# Patient Record
Sex: Male | Born: 1940
Health system: Southern US, Community
[De-identification: ages and names within clinical notes are randomized; demographics above are authoritative.]

## PROBLEM LIST (undated history)

## (undated) DIAGNOSIS — F419 Anxiety disorder, unspecified: Secondary | ICD-10-CM

## (undated) DIAGNOSIS — K449 Diaphragmatic hernia without obstruction or gangrene: Secondary | ICD-10-CM

## (undated) DIAGNOSIS — F329 Major depressive disorder, single episode, unspecified: Secondary | ICD-10-CM

## (undated) DIAGNOSIS — K5732 Diverticulitis of large intestine without perforation or abscess without bleeding: Secondary | ICD-10-CM

## (undated) DIAGNOSIS — T7840XA Allergy, unspecified, initial encounter: Secondary | ICD-10-CM

## (undated) DIAGNOSIS — M5136 Other intervertebral disc degeneration, lumbar region: Secondary | ICD-10-CM

## (undated) DIAGNOSIS — Z9889 Other specified postprocedural states: Secondary | ICD-10-CM

## (undated) DIAGNOSIS — J189 Pneumonia, unspecified organism: Secondary | ICD-10-CM

## (undated) DIAGNOSIS — M4800 Spinal stenosis, site unspecified: Secondary | ICD-10-CM

## (undated) DIAGNOSIS — N400 Enlarged prostate without lower urinary tract symptoms: Secondary | ICD-10-CM

## (undated) DIAGNOSIS — Z860100 Personal history of colon polyps, unspecified: Secondary | ICD-10-CM

## (undated) DIAGNOSIS — I739 Peripheral vascular disease, unspecified: Secondary | ICD-10-CM

## (undated) DIAGNOSIS — M51369 Other intervertebral disc degeneration, lumbar region without mention of lumbar back pain or lower extremity pain: Secondary | ICD-10-CM

## (undated) DIAGNOSIS — M545 Low back pain, unspecified: Secondary | ICD-10-CM

## (undated) DIAGNOSIS — G4733 Obstructive sleep apnea (adult) (pediatric): Secondary | ICD-10-CM

## (undated) DIAGNOSIS — I2510 Atherosclerotic heart disease of native coronary artery without angina pectoris: Secondary | ICD-10-CM

## (undated) DIAGNOSIS — IMO0001 Reserved for inherently not codable concepts without codable children: Secondary | ICD-10-CM

## (undated) DIAGNOSIS — N189 Chronic kidney disease, unspecified: Secondary | ICD-10-CM

## (undated) DIAGNOSIS — E785 Hyperlipidemia, unspecified: Secondary | ICD-10-CM

## (undated) DIAGNOSIS — D509 Iron deficiency anemia, unspecified: Secondary | ICD-10-CM

## (undated) DIAGNOSIS — K222 Esophageal obstruction: Secondary | ICD-10-CM

## (undated) DIAGNOSIS — G9340 Encephalopathy, unspecified: Secondary | ICD-10-CM

## (undated) DIAGNOSIS — G4700 Insomnia, unspecified: Secondary | ICD-10-CM

## (undated) DIAGNOSIS — J439 Emphysema, unspecified: Secondary | ICD-10-CM

## (undated) DIAGNOSIS — I10 Essential (primary) hypertension: Secondary | ICD-10-CM

## (undated) DIAGNOSIS — K219 Gastro-esophageal reflux disease without esophagitis: Secondary | ICD-10-CM

## (undated) DIAGNOSIS — M1990 Unspecified osteoarthritis, unspecified site: Secondary | ICD-10-CM

## (undated) DIAGNOSIS — F0390 Unspecified dementia without behavioral disturbance: Secondary | ICD-10-CM

## (undated) DIAGNOSIS — F32A Depression, unspecified: Secondary | ICD-10-CM

## (undated) DIAGNOSIS — H9190 Unspecified hearing loss, unspecified ear: Secondary | ICD-10-CM

## (undated) DIAGNOSIS — J449 Chronic obstructive pulmonary disease, unspecified: Secondary | ICD-10-CM

## (undated) DIAGNOSIS — R413 Other amnesia: Secondary | ICD-10-CM

## (undated) DIAGNOSIS — IMO0002 Reserved for concepts with insufficient information to code with codable children: Secondary | ICD-10-CM

## (undated) HISTORY — DX: Unspecified osteoarthritis, unspecified site: M19.90

## (undated) HISTORY — DX: Other intervertebral disc degeneration, lumbar region: M51.36

## (undated) HISTORY — DX: Anxiety disorder, unspecified: F41.9

## (undated) HISTORY — DX: Major depressive disorder, single episode, unspecified: F32.9

## (undated) HISTORY — DX: Chronic obstructive pulmonary disease, unspecified: J44.9

## (undated) HISTORY — DX: Diaphragmatic hernia without obstruction or gangrene: K44.9

## (undated) HISTORY — DX: Benign prostatic hyperplasia without lower urinary tract symptoms: N40.0

## (undated) HISTORY — DX: Low back pain: M54.5

## (undated) HISTORY — PX: SHOULDER ARTHROSCOPY: SHX128

## (undated) HISTORY — DX: Obstructive sleep apnea (adult) (pediatric): G47.33

## (undated) HISTORY — DX: Essential (primary) hypertension: I10

## (undated) HISTORY — DX: Low back pain, unspecified: M54.50

## (undated) HISTORY — DX: Other specified postprocedural states: Z98.890

## (undated) HISTORY — DX: Iron deficiency anemia, unspecified: D50.9

## (undated) HISTORY — DX: Personal history of colon polyps, unspecified: Z86.0100

## (undated) HISTORY — DX: Reserved for concepts with insufficient information to code with codable children: IMO0002

## (undated) HISTORY — DX: Gastro-esophageal reflux disease without esophagitis: K21.9

## (undated) HISTORY — PX: CIRCUMCISION: SUR203

## (undated) HISTORY — DX: Atherosclerotic heart disease of native coronary artery without angina pectoris: I25.10

## (undated) HISTORY — DX: Encephalopathy, unspecified: G93.40

## (undated) HISTORY — DX: Peripheral vascular disease, unspecified: I73.9

## (undated) HISTORY — PX: CARPAL TUNNEL RELEASE: SHX101

## (undated) HISTORY — DX: Diverticulitis of large intestine without perforation or abscess without bleeding: K57.32

## (undated) HISTORY — DX: Spinal stenosis, site unspecified: M48.00

## (undated) HISTORY — DX: Depression, unspecified: F32.A

## (undated) HISTORY — DX: Personal history of colonic polyps: Z86.010

## (undated) HISTORY — DX: Esophageal obstruction: K22.2

## (undated) HISTORY — DX: Other amnesia: R41.3

## (undated) HISTORY — DX: Emphysema, unspecified: J43.9

## (undated) HISTORY — DX: Other intervertebral disc degeneration, lumbar region without mention of lumbar back pain or lower extremity pain: M51.369

## (undated) HISTORY — DX: Insomnia, unspecified: G47.00

## (undated) HISTORY — PX: OTHER SURGICAL HISTORY: SHX169

## (undated) HISTORY — PX: HEMORRHOID SURGERY: SHX153

## (undated) HISTORY — DX: Allergy, unspecified, initial encounter: T78.40XA

## (undated) HISTORY — DX: Hyperlipidemia, unspecified: E78.5

---

## 1997-04-29 ENCOUNTER — Encounter: Payer: Self-pay | Admitting: Internal Medicine

## 1997-05-09 HISTORY — PX: CORONARY ANGIOPLASTY WITH STENT PLACEMENT: SHX49

## 1997-09-06 HISTORY — PX: CORONARY ARTERY BYPASS GRAFT: SHX141

## 1997-09-07 ENCOUNTER — Inpatient Hospital Stay (HOSPITAL_COMMUNITY): Admission: EM | Admit: 1997-09-07 | Discharge: 1997-09-16 | Payer: Self-pay | Admitting: Emergency Medicine

## 1997-11-22 ENCOUNTER — Encounter (HOSPITAL_COMMUNITY): Admission: RE | Admit: 1997-11-22 | Discharge: 1998-02-20 | Payer: Self-pay | Admitting: Cardiovascular Disease

## 1998-03-15 ENCOUNTER — Encounter: Payer: Self-pay | Admitting: Internal Medicine

## 1998-10-31 ENCOUNTER — Ambulatory Visit (HOSPITAL_BASED_OUTPATIENT_CLINIC_OR_DEPARTMENT_OTHER): Admission: RE | Admit: 1998-10-31 | Discharge: 1998-10-31 | Payer: Self-pay | Admitting: Orthopedic Surgery

## 1999-02-21 ENCOUNTER — Ambulatory Visit (HOSPITAL_COMMUNITY): Admission: RE | Admit: 1999-02-21 | Discharge: 1999-02-21 | Payer: Self-pay | Admitting: Orthopedic Surgery

## 1999-05-04 ENCOUNTER — Encounter: Payer: Self-pay | Admitting: Internal Medicine

## 1999-05-15 ENCOUNTER — Encounter: Admission: RE | Admit: 1999-05-15 | Discharge: 1999-05-15 | Payer: Self-pay | Admitting: Orthopedic Surgery

## 1999-05-16 ENCOUNTER — Ambulatory Visit (HOSPITAL_BASED_OUTPATIENT_CLINIC_OR_DEPARTMENT_OTHER): Admission: RE | Admit: 1999-05-16 | Discharge: 1999-05-17 | Payer: Self-pay | Admitting: Orthopedic Surgery

## 1999-12-25 ENCOUNTER — Ambulatory Visit (HOSPITAL_COMMUNITY): Admission: RE | Admit: 1999-12-25 | Discharge: 1999-12-25 | Payer: Self-pay | Admitting: Orthopedic Surgery

## 2000-01-31 ENCOUNTER — Encounter: Payer: Self-pay | Admitting: Orthopedic Surgery

## 2000-02-04 ENCOUNTER — Inpatient Hospital Stay (HOSPITAL_COMMUNITY): Admission: RE | Admit: 2000-02-04 | Discharge: 2000-02-06 | Payer: Self-pay | Admitting: Orthopedic Surgery

## 2000-02-04 ENCOUNTER — Encounter: Payer: Self-pay | Admitting: Orthopedic Surgery

## 2000-02-05 ENCOUNTER — Encounter: Payer: Self-pay | Admitting: Orthopedic Surgery

## 2000-07-16 ENCOUNTER — Encounter: Payer: Self-pay | Admitting: Internal Medicine

## 2000-08-29 ENCOUNTER — Encounter (INDEPENDENT_AMBULATORY_CARE_PROVIDER_SITE_OTHER): Payer: Self-pay | Admitting: Specialist

## 2000-08-29 ENCOUNTER — Encounter: Payer: Self-pay | Admitting: Internal Medicine

## 2000-08-29 ENCOUNTER — Ambulatory Visit (HOSPITAL_COMMUNITY): Admission: RE | Admit: 2000-08-29 | Discharge: 2000-08-29 | Payer: Self-pay | Admitting: Internal Medicine

## 2000-10-22 ENCOUNTER — Encounter: Payer: Self-pay | Admitting: Internal Medicine

## 2000-10-22 ENCOUNTER — Ambulatory Visit (HOSPITAL_COMMUNITY): Admission: RE | Admit: 2000-10-22 | Discharge: 2000-10-22 | Payer: Self-pay | Admitting: Internal Medicine

## 2000-10-24 ENCOUNTER — Ambulatory Visit (HOSPITAL_BASED_OUTPATIENT_CLINIC_OR_DEPARTMENT_OTHER): Admission: RE | Admit: 2000-10-24 | Discharge: 2000-10-24 | Payer: Self-pay | Admitting: Internal Medicine

## 2000-11-27 ENCOUNTER — Encounter: Admission: RE | Admit: 2000-11-27 | Discharge: 2000-12-06 | Payer: Self-pay | Admitting: Anesthesiology

## 2001-10-12 ENCOUNTER — Ambulatory Visit (HOSPITAL_COMMUNITY): Admission: RE | Admit: 2001-10-12 | Discharge: 2001-10-13 | Payer: Self-pay | Admitting: Cardiovascular Disease

## 2001-10-12 ENCOUNTER — Encounter: Payer: Self-pay | Admitting: Cardiovascular Disease

## 2002-01-25 ENCOUNTER — Encounter: Payer: Self-pay | Admitting: Internal Medicine

## 2002-04-16 ENCOUNTER — Emergency Department (HOSPITAL_COMMUNITY): Admission: EM | Admit: 2002-04-16 | Discharge: 2002-04-16 | Payer: Self-pay | Admitting: Emergency Medicine

## 2002-04-16 ENCOUNTER — Encounter: Payer: Self-pay | Admitting: Emergency Medicine

## 2002-05-20 ENCOUNTER — Encounter: Admission: RE | Admit: 2002-05-20 | Discharge: 2002-08-18 | Payer: Self-pay

## 2002-10-12 ENCOUNTER — Encounter: Payer: Self-pay | Admitting: Internal Medicine

## 2002-10-20 ENCOUNTER — Encounter: Payer: Self-pay | Admitting: Internal Medicine

## 2002-10-20 ENCOUNTER — Ambulatory Visit (HOSPITAL_COMMUNITY): Admission: RE | Admit: 2002-10-20 | Discharge: 2002-10-20 | Payer: Self-pay | Admitting: Internal Medicine

## 2003-01-18 ENCOUNTER — Ambulatory Visit (HOSPITAL_COMMUNITY): Admission: RE | Admit: 2003-01-18 | Discharge: 2003-01-18 | Payer: Self-pay | Admitting: Internal Medicine

## 2003-01-18 ENCOUNTER — Encounter: Payer: Self-pay | Admitting: Internal Medicine

## 2003-03-05 ENCOUNTER — Emergency Department (HOSPITAL_COMMUNITY): Admission: EM | Admit: 2003-03-05 | Discharge: 2003-03-06 | Payer: Self-pay | Admitting: Emergency Medicine

## 2003-06-19 ENCOUNTER — Emergency Department (HOSPITAL_COMMUNITY): Admission: EM | Admit: 2003-06-19 | Discharge: 2003-06-19 | Payer: Self-pay

## 2003-11-24 ENCOUNTER — Ambulatory Visit (HOSPITAL_COMMUNITY): Admission: RE | Admit: 2003-11-24 | Discharge: 2003-11-24 | Payer: Self-pay | Admitting: Cardiovascular Disease

## 2003-12-26 ENCOUNTER — Encounter
Admission: RE | Admit: 2003-12-26 | Discharge: 2004-03-25 | Payer: Self-pay | Admitting: Physical Medicine & Rehabilitation

## 2003-12-27 ENCOUNTER — Ambulatory Visit: Payer: Self-pay | Admitting: Physical Medicine & Rehabilitation

## 2004-01-02 ENCOUNTER — Inpatient Hospital Stay (HOSPITAL_COMMUNITY): Admission: EM | Admit: 2004-01-02 | Discharge: 2004-01-04 | Payer: Self-pay | Admitting: Psychiatry

## 2004-01-02 ENCOUNTER — Ambulatory Visit: Payer: Self-pay | Admitting: Psychiatry

## 2004-01-02 ENCOUNTER — Encounter: Payer: Self-pay | Admitting: Physical Medicine & Rehabilitation

## 2004-01-02 ENCOUNTER — Emergency Department (HOSPITAL_COMMUNITY): Admission: EM | Admit: 2004-01-02 | Discharge: 2004-01-02 | Payer: Self-pay | Admitting: Emergency Medicine

## 2004-01-03 ENCOUNTER — Emergency Department (HOSPITAL_COMMUNITY): Admission: EM | Admit: 2004-01-03 | Discharge: 2004-01-03 | Payer: Self-pay | Admitting: Emergency Medicine

## 2004-01-04 ENCOUNTER — Inpatient Hospital Stay (HOSPITAL_COMMUNITY): Admission: EM | Admit: 2004-01-04 | Discharge: 2004-01-07 | Payer: Self-pay | Admitting: Emergency Medicine

## 2004-02-06 ENCOUNTER — Ambulatory Visit: Payer: Self-pay | Admitting: Physical Medicine & Rehabilitation

## 2004-02-15 ENCOUNTER — Ambulatory Visit (HOSPITAL_BASED_OUTPATIENT_CLINIC_OR_DEPARTMENT_OTHER): Admission: RE | Admit: 2004-02-15 | Discharge: 2004-02-15 | Payer: Self-pay | Admitting: Orthopedic Surgery

## 2004-02-15 ENCOUNTER — Encounter: Admission: RE | Admit: 2004-02-15 | Discharge: 2004-02-15 | Payer: Self-pay | Admitting: Orthopedic Surgery

## 2004-04-12 ENCOUNTER — Ambulatory Visit: Payer: Self-pay | Admitting: Internal Medicine

## 2004-06-06 ENCOUNTER — Ambulatory Visit: Payer: Self-pay | Admitting: Internal Medicine

## 2004-06-15 ENCOUNTER — Ambulatory Visit (HOSPITAL_COMMUNITY): Admission: RE | Admit: 2004-06-15 | Discharge: 2004-06-15 | Payer: Self-pay | Admitting: Internal Medicine

## 2004-06-15 ENCOUNTER — Ambulatory Visit: Payer: Self-pay | Admitting: Internal Medicine

## 2005-01-30 ENCOUNTER — Ambulatory Visit: Payer: Self-pay | Admitting: Internal Medicine

## 2005-01-31 ENCOUNTER — Ambulatory Visit: Payer: Self-pay | Admitting: Internal Medicine

## 2005-02-12 ENCOUNTER — Ambulatory Visit: Payer: Self-pay | Admitting: Internal Medicine

## 2005-05-10 ENCOUNTER — Ambulatory Visit: Payer: Self-pay | Admitting: Internal Medicine

## 2005-07-24 ENCOUNTER — Ambulatory Visit: Payer: Self-pay | Admitting: Internal Medicine

## 2005-08-02 ENCOUNTER — Ambulatory Visit: Payer: Self-pay | Admitting: Internal Medicine

## 2005-10-01 ENCOUNTER — Ambulatory Visit: Payer: Self-pay | Admitting: Internal Medicine

## 2005-10-02 ENCOUNTER — Ambulatory Visit: Payer: Self-pay | Admitting: Internal Medicine

## 2005-10-28 ENCOUNTER — Ambulatory Visit: Payer: Self-pay | Admitting: Internal Medicine

## 2005-11-06 ENCOUNTER — Ambulatory Visit (HOSPITAL_COMMUNITY): Admission: RE | Admit: 2005-11-06 | Discharge: 2005-11-06 | Payer: Self-pay | Admitting: Internal Medicine

## 2006-01-23 ENCOUNTER — Ambulatory Visit: Payer: Self-pay | Admitting: Internal Medicine

## 2006-05-02 ENCOUNTER — Ambulatory Visit: Payer: Self-pay | Admitting: Internal Medicine

## 2006-05-28 ENCOUNTER — Ambulatory Visit: Payer: Self-pay | Admitting: Endocrinology

## 2006-06-24 ENCOUNTER — Ambulatory Visit: Payer: Self-pay | Admitting: Internal Medicine

## 2006-07-22 ENCOUNTER — Ambulatory Visit: Payer: Self-pay | Admitting: Internal Medicine

## 2006-07-25 ENCOUNTER — Ambulatory Visit: Payer: Self-pay | Admitting: Cardiology

## 2006-12-18 ENCOUNTER — Ambulatory Visit: Payer: Self-pay | Admitting: Internal Medicine

## 2006-12-18 LAB — CONVERTED CEMR LAB
AST: 19 units/L (ref 0–37)
Albumin: 3.5 g/dL (ref 3.5–5.2)
Basophils Relative: 0.3 % (ref 0.0–1.0)
Bilirubin, Direct: 0.1 mg/dL (ref 0.0–0.3)
CO2: 29 meq/L (ref 19–32)
Chloride: 100 meq/L (ref 96–112)
Creatinine, Ser: 0.7 mg/dL (ref 0.4–1.5)
Eosinophils Relative: 2.9 % (ref 0.0–5.0)
Glucose, Bld: 107 mg/dL — ABNORMAL HIGH (ref 70–99)
HCT: 37.8 % — ABNORMAL LOW (ref 39.0–52.0)
Hemoglobin: 12.7 g/dL — ABNORMAL LOW (ref 13.0–17.0)
Hgb A1c MFr Bld: 7 % — ABNORMAL HIGH (ref 4.6–6.0)
Monocytes Absolute: 0.4 10*3/uL (ref 0.2–0.7)
Neutrophils Relative %: 60.3 % (ref 43.0–77.0)
RBC: 4.13 M/uL — ABNORMAL LOW (ref 4.22–5.81)
RDW: 12.8 % (ref 11.5–14.6)
Sodium: 137 meq/L (ref 135–145)
Total Bilirubin: 0.5 mg/dL (ref 0.3–1.2)
Total CHOL/HDL Ratio: 9
Total Protein: 7.1 g/dL (ref 6.0–8.3)
Triglycerides: 511 mg/dL (ref 0–149)
WBC: 6.2 10*3/uL (ref 4.5–10.5)

## 2006-12-26 ENCOUNTER — Encounter: Admission: RE | Admit: 2006-12-26 | Discharge: 2006-12-26 | Payer: Self-pay | Admitting: Internal Medicine

## 2007-01-05 ENCOUNTER — Ambulatory Visit: Payer: Self-pay | Admitting: Internal Medicine

## 2007-01-13 ENCOUNTER — Ambulatory Visit: Payer: Self-pay | Admitting: Cardiology

## 2007-01-21 ENCOUNTER — Ambulatory Visit: Payer: Self-pay | Admitting: Internal Medicine

## 2007-01-22 ENCOUNTER — Telehealth (INDEPENDENT_AMBULATORY_CARE_PROVIDER_SITE_OTHER): Payer: Self-pay | Admitting: *Deleted

## 2007-02-09 ENCOUNTER — Ambulatory Visit (HOSPITAL_COMMUNITY): Admission: RE | Admit: 2007-02-09 | Discharge: 2007-02-09 | Payer: Self-pay | Admitting: Internal Medicine

## 2007-02-09 ENCOUNTER — Encounter: Payer: Self-pay | Admitting: Internal Medicine

## 2007-02-09 LAB — HM COLONOSCOPY

## 2007-02-17 ENCOUNTER — Encounter: Payer: Self-pay | Admitting: Internal Medicine

## 2007-02-20 ENCOUNTER — Ambulatory Visit: Payer: Self-pay | Admitting: Internal Medicine

## 2007-04-10 ENCOUNTER — Encounter: Payer: Self-pay | Admitting: Internal Medicine

## 2007-04-10 ENCOUNTER — Encounter: Admission: RE | Admit: 2007-04-10 | Discharge: 2007-04-10 | Payer: Self-pay | Admitting: Orthopedic Surgery

## 2007-04-27 ENCOUNTER — Encounter: Admission: RE | Admit: 2007-04-27 | Discharge: 2007-04-27 | Payer: Self-pay | Admitting: Orthopedic Surgery

## 2007-04-29 ENCOUNTER — Telehealth: Payer: Self-pay | Admitting: Internal Medicine

## 2007-06-12 DIAGNOSIS — E785 Hyperlipidemia, unspecified: Secondary | ICD-10-CM | POA: Insufficient documentation

## 2007-06-12 DIAGNOSIS — I10 Essential (primary) hypertension: Secondary | ICD-10-CM | POA: Insufficient documentation

## 2007-06-12 DIAGNOSIS — D126 Benign neoplasm of colon, unspecified: Secondary | ICD-10-CM | POA: Insufficient documentation

## 2007-06-12 DIAGNOSIS — M5136 Other intervertebral disc degeneration, lumbar region: Secondary | ICD-10-CM | POA: Insufficient documentation

## 2007-06-12 DIAGNOSIS — I2510 Atherosclerotic heart disease of native coronary artery without angina pectoris: Secondary | ICD-10-CM | POA: Insufficient documentation

## 2007-06-12 DIAGNOSIS — I739 Peripheral vascular disease, unspecified: Secondary | ICD-10-CM | POA: Insufficient documentation

## 2007-06-12 DIAGNOSIS — I209 Angina pectoris, unspecified: Secondary | ICD-10-CM | POA: Insufficient documentation

## 2007-06-12 DIAGNOSIS — M545 Low back pain, unspecified: Secondary | ICD-10-CM | POA: Insufficient documentation

## 2007-06-12 DIAGNOSIS — M4800 Spinal stenosis, site unspecified: Secondary | ICD-10-CM | POA: Insufficient documentation

## 2007-06-12 DIAGNOSIS — K219 Gastro-esophageal reflux disease without esophagitis: Secondary | ICD-10-CM | POA: Insufficient documentation

## 2007-06-12 DIAGNOSIS — K222 Esophageal obstruction: Secondary | ICD-10-CM | POA: Insufficient documentation

## 2007-06-12 DIAGNOSIS — E119 Type 2 diabetes mellitus without complications: Secondary | ICD-10-CM | POA: Insufficient documentation

## 2007-06-12 DIAGNOSIS — K5730 Diverticulosis of large intestine without perforation or abscess without bleeding: Secondary | ICD-10-CM | POA: Insufficient documentation

## 2007-06-24 ENCOUNTER — Ambulatory Visit: Payer: Self-pay | Admitting: Internal Medicine

## 2007-06-24 DIAGNOSIS — G4710 Hypersomnia, unspecified: Secondary | ICD-10-CM | POA: Insufficient documentation

## 2007-06-24 DIAGNOSIS — F3289 Other specified depressive episodes: Secondary | ICD-10-CM | POA: Insufficient documentation

## 2007-06-24 DIAGNOSIS — F411 Generalized anxiety disorder: Secondary | ICD-10-CM | POA: Insufficient documentation

## 2007-06-24 DIAGNOSIS — R5383 Other fatigue: Secondary | ICD-10-CM

## 2007-06-24 DIAGNOSIS — F329 Major depressive disorder, single episode, unspecified: Secondary | ICD-10-CM | POA: Insufficient documentation

## 2007-06-24 DIAGNOSIS — R3911 Hesitancy of micturition: Secondary | ICD-10-CM | POA: Insufficient documentation

## 2007-06-24 DIAGNOSIS — R109 Unspecified abdominal pain: Secondary | ICD-10-CM | POA: Insufficient documentation

## 2007-06-24 DIAGNOSIS — R5381 Other malaise: Secondary | ICD-10-CM | POA: Insufficient documentation

## 2007-06-24 DIAGNOSIS — N400 Enlarged prostate without lower urinary tract symptoms: Secondary | ICD-10-CM | POA: Insufficient documentation

## 2007-06-25 LAB — CONVERTED CEMR LAB
ALT: 15 units/L (ref 0–53)
AST: 20 units/L (ref 0–37)
Albumin: 3.4 g/dL — ABNORMAL LOW (ref 3.5–5.2)
Alkaline Phosphatase: 90 units/L (ref 39–117)
BUN: 14 mg/dL (ref 6–23)
Basophils Absolute: 0.1 10*3/uL (ref 0.0–0.1)
Bilirubin Urine: NEGATIVE
Chloride: 103 meq/L (ref 96–112)
Cholesterol: 174 mg/dL (ref 0–200)
Creatinine, Ser: 0.8 mg/dL (ref 0.4–1.5)
Direct LDL: 107.6 mg/dL
HCT: 35.9 % — ABNORMAL LOW (ref 39.0–52.0)
Ketones, ur: NEGATIVE mg/dL
MCHC: 32.2 g/dL (ref 30.0–36.0)
Monocytes Relative: 10.3 % (ref 3.0–11.0)
RBC: 4.05 M/uL — ABNORMAL LOW (ref 4.22–5.81)
RDW: 15.5 % — ABNORMAL HIGH (ref 11.5–14.6)
TSH: 2.87 microintl units/mL (ref 0.35–5.50)
Total Bilirubin: 0.5 mg/dL (ref 0.3–1.2)
Total CHOL/HDL Ratio: 5.8
Total Protein, Urine: NEGATIVE mg/dL
Urobilinogen, UA: 0.2 (ref 0.0–1.0)
VLDL: 44 mg/dL — ABNORMAL HIGH (ref 0–40)
pH: 6 (ref 5.0–8.0)

## 2007-07-01 ENCOUNTER — Encounter: Admission: RE | Admit: 2007-07-01 | Discharge: 2007-07-01 | Payer: Self-pay | Admitting: Internal Medicine

## 2007-07-03 ENCOUNTER — Encounter: Payer: Self-pay | Admitting: Internal Medicine

## 2007-07-03 ENCOUNTER — Ambulatory Visit: Payer: Self-pay | Admitting: Internal Medicine

## 2007-07-03 ENCOUNTER — Ambulatory Visit: Payer: Self-pay | Admitting: Pulmonary Disease

## 2007-07-03 DIAGNOSIS — G4733 Obstructive sleep apnea (adult) (pediatric): Secondary | ICD-10-CM | POA: Insufficient documentation

## 2007-07-22 ENCOUNTER — Ambulatory Visit: Payer: Self-pay | Admitting: Cardiology

## 2007-07-29 ENCOUNTER — Ambulatory Visit (HOSPITAL_BASED_OUTPATIENT_CLINIC_OR_DEPARTMENT_OTHER): Admission: RE | Admit: 2007-07-29 | Discharge: 2007-07-29 | Payer: Self-pay | Admitting: Pulmonary Disease

## 2007-07-29 ENCOUNTER — Encounter: Payer: Self-pay | Admitting: Pulmonary Disease

## 2007-08-12 ENCOUNTER — Telehealth (INDEPENDENT_AMBULATORY_CARE_PROVIDER_SITE_OTHER): Payer: Self-pay | Admitting: *Deleted

## 2007-08-13 ENCOUNTER — Ambulatory Visit: Payer: Self-pay | Admitting: Pulmonary Disease

## 2007-08-14 ENCOUNTER — Ambulatory Visit: Admission: RE | Admit: 2007-08-14 | Discharge: 2007-08-14 | Payer: Self-pay | Admitting: Pulmonary Disease

## 2007-08-14 ENCOUNTER — Encounter: Payer: Self-pay | Admitting: Pulmonary Disease

## 2007-08-18 ENCOUNTER — Ambulatory Visit: Payer: Self-pay | Admitting: Pulmonary Disease

## 2007-08-27 ENCOUNTER — Telehealth (INDEPENDENT_AMBULATORY_CARE_PROVIDER_SITE_OTHER): Payer: Self-pay | Admitting: *Deleted

## 2007-08-29 ENCOUNTER — Encounter: Payer: Self-pay | Admitting: Pulmonary Disease

## 2007-09-02 ENCOUNTER — Encounter: Payer: Self-pay | Admitting: Pulmonary Disease

## 2007-09-07 ENCOUNTER — Ambulatory Visit: Payer: Self-pay | Admitting: Pulmonary Disease

## 2007-09-07 DIAGNOSIS — J438 Other emphysema: Secondary | ICD-10-CM | POA: Insufficient documentation

## 2007-12-27 ENCOUNTER — Encounter: Payer: Self-pay | Admitting: Pulmonary Disease

## 2007-12-31 ENCOUNTER — Ambulatory Visit: Payer: Self-pay | Admitting: Internal Medicine

## 2007-12-31 DIAGNOSIS — J441 Chronic obstructive pulmonary disease with (acute) exacerbation: Secondary | ICD-10-CM | POA: Insufficient documentation

## 2008-01-03 ENCOUNTER — Encounter: Payer: Self-pay | Admitting: Internal Medicine

## 2008-01-03 DIAGNOSIS — M5137 Other intervertebral disc degeneration, lumbosacral region: Secondary | ICD-10-CM | POA: Insufficient documentation

## 2008-01-04 ENCOUNTER — Telehealth (INDEPENDENT_AMBULATORY_CARE_PROVIDER_SITE_OTHER): Payer: Self-pay | Admitting: *Deleted

## 2008-01-04 LAB — CONVERTED CEMR LAB
Basophils Absolute: 0.1 10*3/uL (ref 0.0–0.1)
Basophils Relative: 0.9 % (ref 0.0–3.0)
Eosinophils Absolute: 0.2 10*3/uL (ref 0.0–0.7)
HDL: 30.4 mg/dL — ABNORMAL LOW (ref >39.0)
MCHC: 33.2 g/dL (ref 30.0–36.0)
MCV: 82.8 fL (ref 78.0–100.0)
Neutrophils Relative %: 55.2 % (ref 43.0–77.0)
PSA: 0.12 ng/mL (ref 0.10–4.00)
Platelets: 390 10*3/uL (ref 150–400)
RBC: 4.4 M/uL (ref 4.22–5.81)
Triglycerides: 420 mg/dL (ref 0–149)

## 2008-01-11 ENCOUNTER — Ambulatory Visit: Payer: Self-pay | Admitting: Cardiology

## 2008-01-14 ENCOUNTER — Ambulatory Visit: Payer: Self-pay | Admitting: Internal Medicine

## 2008-02-17 ENCOUNTER — Ambulatory Visit: Payer: Self-pay | Admitting: Pulmonary Disease

## 2008-03-14 ENCOUNTER — Ambulatory Visit: Payer: Self-pay | Admitting: Internal Medicine

## 2008-04-06 ENCOUNTER — Telehealth: Payer: Self-pay | Admitting: Internal Medicine

## 2008-05-18 ENCOUNTER — Ambulatory Visit: Payer: Self-pay | Admitting: Internal Medicine

## 2008-05-18 DIAGNOSIS — R413 Other amnesia: Secondary | ICD-10-CM | POA: Insufficient documentation

## 2008-05-18 DIAGNOSIS — G4700 Insomnia, unspecified: Secondary | ICD-10-CM | POA: Insufficient documentation

## 2008-05-20 ENCOUNTER — Encounter: Payer: Self-pay | Admitting: Internal Medicine

## 2008-05-20 DIAGNOSIS — D509 Iron deficiency anemia, unspecified: Secondary | ICD-10-CM | POA: Insufficient documentation

## 2008-05-23 LAB — CONVERTED CEMR LAB
AST: 18 units/L (ref 0–37)
Alkaline Phosphatase: 75 units/L (ref 39–117)
Chloride: 107 meq/L (ref 96–112)
Cholesterol: 159 mg/dL (ref 0–200)
Direct LDL: 95.1 mg/dL
Folate: >20 ng/mL
GFR calc Af Amer: 146 mL/min
GFR calc non Af Amer: 120 mL/min
HDL: 29.7 mg/dL — ABNORMAL LOW (ref >39.0)
Hemoglobin: 11 g/dL — ABNORMAL LOW (ref 13.0–17.0)
Hgb A1c MFr Bld: 7.1 % — ABNORMAL HIGH (ref 4.6–6.0)
Lymphocytes Relative: 32.7 % (ref 12.0–46.0)
Monocytes Relative: 10.6 % (ref 3.0–12.0)
Neutrophils Relative %: 53.4 % (ref 43.0–77.0)
Platelets: 282 10*3/uL (ref 150–400)
Potassium: 4.7 meq/L (ref 3.5–5.1)
RDW: 15.2 % — ABNORMAL HIGH (ref 11.5–14.6)
Saturation Ratios: 5.2 % — ABNORMAL LOW (ref 20.0–50.0)
Sodium: 142 meq/L (ref 135–145)
TSH: 3.9 microintl units/mL (ref 0.35–5.50)
Total Bilirubin: 0.5 mg/dL (ref 0.3–1.2)
Total CHOL/HDL Ratio: 5.4
VLDL: 49 mg/dL — ABNORMAL HIGH (ref 0–40)
Vitamin B-12: 400 pg/mL (ref 211–911)

## 2008-05-28 ENCOUNTER — Encounter: Admission: RE | Admit: 2008-05-28 | Discharge: 2008-05-28 | Payer: Self-pay | Admitting: Pediatrics

## 2008-06-06 ENCOUNTER — Telehealth (INDEPENDENT_AMBULATORY_CARE_PROVIDER_SITE_OTHER): Payer: Self-pay | Admitting: *Deleted

## 2008-06-24 ENCOUNTER — Ambulatory Visit: Payer: Self-pay | Admitting: Family Medicine

## 2008-06-24 DIAGNOSIS — M1990 Unspecified osteoarthritis, unspecified site: Secondary | ICD-10-CM | POA: Insufficient documentation

## 2008-06-24 DIAGNOSIS — J45909 Unspecified asthma, uncomplicated: Secondary | ICD-10-CM | POA: Insufficient documentation

## 2008-06-24 DIAGNOSIS — S20219A Contusion of unspecified front wall of thorax, initial encounter: Secondary | ICD-10-CM | POA: Insufficient documentation

## 2008-07-22 ENCOUNTER — Encounter: Payer: Self-pay | Admitting: Internal Medicine

## 2008-07-25 ENCOUNTER — Ambulatory Visit: Payer: Self-pay | Admitting: Internal Medicine

## 2008-07-25 DIAGNOSIS — R1319 Other dysphagia: Secondary | ICD-10-CM | POA: Insufficient documentation

## 2008-07-25 DIAGNOSIS — R1084 Generalized abdominal pain: Secondary | ICD-10-CM | POA: Insufficient documentation

## 2008-07-26 ENCOUNTER — Encounter: Payer: Self-pay | Admitting: Cardiology

## 2008-07-26 ENCOUNTER — Ambulatory Visit: Payer: Self-pay | Admitting: Cardiology

## 2008-07-28 ENCOUNTER — Encounter: Payer: Self-pay | Admitting: Internal Medicine

## 2008-08-05 ENCOUNTER — Ambulatory Visit: Payer: Self-pay | Admitting: Internal Medicine

## 2008-08-05 ENCOUNTER — Encounter: Payer: Self-pay | Admitting: Internal Medicine

## 2008-08-08 ENCOUNTER — Encounter: Payer: Self-pay | Admitting: Internal Medicine

## 2008-08-10 ENCOUNTER — Ambulatory Visit: Payer: Self-pay | Admitting: Cardiovascular Disease

## 2008-10-06 ENCOUNTER — Telehealth (INDEPENDENT_AMBULATORY_CARE_PROVIDER_SITE_OTHER): Payer: Self-pay | Admitting: *Deleted

## 2008-11-17 ENCOUNTER — Encounter (INDEPENDENT_AMBULATORY_CARE_PROVIDER_SITE_OTHER): Payer: Self-pay | Admitting: *Deleted

## 2008-11-18 ENCOUNTER — Encounter: Admission: RE | Admit: 2008-11-18 | Discharge: 2008-11-18 | Payer: Self-pay | Admitting: Orthopedic Surgery

## 2008-11-24 ENCOUNTER — Telehealth: Payer: Self-pay | Admitting: Internal Medicine

## 2008-11-26 ENCOUNTER — Encounter: Admission: RE | Admit: 2008-11-26 | Discharge: 2008-11-26 | Payer: Self-pay | Admitting: Anesthesiology

## 2009-01-06 ENCOUNTER — Encounter (INDEPENDENT_AMBULATORY_CARE_PROVIDER_SITE_OTHER): Payer: Self-pay | Admitting: *Deleted

## 2009-01-10 ENCOUNTER — Ambulatory Visit: Payer: Self-pay | Admitting: Internal Medicine

## 2009-01-10 DIAGNOSIS — R634 Abnormal weight loss: Secondary | ICD-10-CM | POA: Insufficient documentation

## 2009-01-11 LAB — CONVERTED CEMR LAB
ALT: 12 units/L (ref 0–53)
AST: 17 units/L (ref 0–37)
Alkaline Phosphatase: 73 units/L (ref 39–117)
BUN: 14 mg/dL (ref 6–23)
Basophils Absolute: 0.1 10*3/uL (ref 0.0–0.1)
Bilirubin, Direct: 0 mg/dL (ref 0.0–0.3)
CO2: 34 meq/L — ABNORMAL HIGH (ref 19–32)
Calcium: 9 mg/dL (ref 8.4–10.5)
Cholesterol: 178 mg/dL (ref 0–200)
Creatinine, Ser: 0.8 mg/dL (ref 0.4–1.5)
Direct LDL: 103.9 mg/dL
Eosinophils Relative: 3.9 % (ref 0.0–5.0)
Folate: 18.7 ng/mL
HCT: 40.9 % (ref 39.0–52.0)
Hgb A1c MFr Bld: 6.9 % — ABNORMAL HIGH (ref 4.6–6.5)
Lymphocytes Relative: 36.9 % (ref 12.0–46.0)
Lymphs Abs: 2.4 10*3/uL (ref 0.7–4.0)
Microalb Creat Ratio: 1.7 mg/g (ref 0.0–30.0)
Microalb, Ur: 0.2 mg/dL (ref 0.0–1.9)
Monocytes Relative: 10 % (ref 3.0–12.0)
PSA: 0.2 ng/mL (ref 0.10–4.00)
Platelets: 242 10*3/uL (ref 150.0–400.0)
RDW: 14.1 % (ref 11.5–14.6)
Saturation Ratios: 11.2 % — ABNORMAL LOW (ref 20.0–50.0)
Total Bilirubin: 0.3 mg/dL (ref 0.3–1.2)
Total CHOL/HDL Ratio: 5
Transferrin: 273.3 mg/dL (ref 212.0–360.0)
Triglycerides: 333 mg/dL — ABNORMAL HIGH (ref 0.0–149.0)
Vitamin B-12: 381 pg/mL (ref 211–911)
WBC: 6.4 10*3/uL (ref 4.5–10.5)

## 2009-01-17 ENCOUNTER — Ambulatory Visit: Payer: Self-pay | Admitting: Cardiology

## 2009-01-17 DIAGNOSIS — F17200 Nicotine dependence, unspecified, uncomplicated: Secondary | ICD-10-CM | POA: Insufficient documentation

## 2009-01-21 ENCOUNTER — Encounter: Admission: RE | Admit: 2009-01-21 | Discharge: 2009-01-21 | Payer: Self-pay | Admitting: Orthopedic Surgery

## 2009-02-21 ENCOUNTER — Telehealth: Payer: Self-pay | Admitting: Internal Medicine

## 2009-04-06 ENCOUNTER — Ambulatory Visit: Payer: Self-pay | Admitting: Internal Medicine

## 2009-04-06 DIAGNOSIS — N478 Other disorders of prepuce: Secondary | ICD-10-CM | POA: Insufficient documentation

## 2009-04-06 DIAGNOSIS — N471 Phimosis: Secondary | ICD-10-CM

## 2009-04-26 ENCOUNTER — Encounter: Payer: Self-pay | Admitting: Internal Medicine

## 2009-05-04 ENCOUNTER — Encounter: Payer: Self-pay | Admitting: Internal Medicine

## 2009-05-04 ENCOUNTER — Encounter: Payer: Self-pay | Admitting: Cardiology

## 2009-05-05 ENCOUNTER — Encounter: Payer: Self-pay | Admitting: Cardiology

## 2009-05-23 ENCOUNTER — Ambulatory Visit: Payer: Self-pay | Admitting: Cardiology

## 2009-05-23 ENCOUNTER — Encounter (INDEPENDENT_AMBULATORY_CARE_PROVIDER_SITE_OTHER): Payer: Self-pay | Admitting: *Deleted

## 2009-05-26 ENCOUNTER — Ambulatory Visit: Payer: Self-pay | Admitting: Internal Medicine

## 2009-05-31 ENCOUNTER — Ambulatory Visit: Payer: Self-pay | Admitting: Internal Medicine

## 2009-06-02 ENCOUNTER — Ambulatory Visit (HOSPITAL_COMMUNITY): Admission: RE | Admit: 2009-06-02 | Discharge: 2009-06-02 | Payer: Self-pay | Admitting: Urology

## 2009-06-09 ENCOUNTER — Encounter: Payer: Self-pay | Admitting: Internal Medicine

## 2009-09-05 ENCOUNTER — Ambulatory Visit: Payer: Self-pay | Admitting: Internal Medicine

## 2009-09-06 ENCOUNTER — Telehealth: Payer: Self-pay | Admitting: Internal Medicine

## 2009-09-08 ENCOUNTER — Telehealth: Payer: Self-pay | Admitting: Internal Medicine

## 2009-11-14 ENCOUNTER — Ambulatory Visit: Payer: Self-pay | Admitting: Cardiology

## 2010-01-04 ENCOUNTER — Ambulatory Visit: Payer: Self-pay | Admitting: Internal Medicine

## 2010-01-16 ENCOUNTER — Ambulatory Visit: Payer: Self-pay | Admitting: Internal Medicine

## 2010-01-16 LAB — CONVERTED CEMR LAB
ALT: 14 units/L (ref 0–53)
Albumin: 3.6 g/dL (ref 3.5–5.2)
CO2: 31 meq/L (ref 19–32)
Calcium: 9.2 mg/dL (ref 8.4–10.5)
Creatinine, Ser: 0.8 mg/dL (ref 0.4–1.5)
Creatinine,U: 179.5 mg/dL
Direct LDL: 101.8 mg/dL
Eosinophils Relative: 2.9 % (ref 0.0–5.0)
Folate: 18.9 ng/mL
GFR calc non Af Amer: 108.76 mL/min (ref >60)
HCT: 39.6 % (ref 39.0–52.0)
HDL: 33.1 mg/dL — ABNORMAL LOW (ref >39.00)
Hemoglobin: 13.4 g/dL (ref 13.0–17.0)
Hgb A1c MFr Bld: 6.5 % (ref 4.6–6.5)
Iron: 45 ug/dL (ref 42–165)
Lymphs Abs: 2 10*3/uL (ref 0.7–4.0)
MCV: 99.9 fL (ref 78.0–100.0)
Microalb Creat Ratio: 0.6 mg/g (ref 0.0–30.0)
Monocytes Absolute: 0.6 10*3/uL (ref 0.1–1.0)
Neutro Abs: 3.4 10*3/uL (ref 1.4–7.7)
Platelets: 216 10*3/uL (ref 150.0–400.0)
RBC: 3.96 M/uL — ABNORMAL LOW (ref 4.22–5.81)
RDW: 13.7 % (ref 11.5–14.6)
Sed Rate: 23 mm/hr — ABNORMAL HIGH (ref 0–22)
Sodium: 137 meq/L (ref 135–145)
Specific Gravity, Urine: >=1.03 (ref 1.000–1.030)
TSH: 2.14 microintl units/mL (ref 0.35–5.50)
Total Bilirubin: 0.3 mg/dL (ref 0.3–1.2)
Total Protein, Urine: NEGATIVE mg/dL
Transferrin: 293.6 mg/dL (ref 212.0–360.0)
Urine Glucose: NEGATIVE mg/dL
pH: 5 (ref 5.0–8.0)

## 2010-01-19 ENCOUNTER — Encounter: Admission: RE | Admit: 2010-01-19 | Discharge: 2010-01-19 | Payer: Self-pay | Admitting: Internal Medicine

## 2010-03-08 ENCOUNTER — Encounter: Payer: Self-pay | Admitting: Internal Medicine

## 2010-04-29 ENCOUNTER — Encounter: Payer: Self-pay | Admitting: Internal Medicine

## 2010-04-29 ENCOUNTER — Encounter: Payer: Self-pay | Admitting: Orthopedic Surgery

## 2010-04-30 ENCOUNTER — Encounter: Payer: Self-pay | Admitting: Internal Medicine

## 2010-05-10 NOTE — Assessment & Plan Note (Signed)
Summary: per check out/sf   Visit Type:  6 MO F/U Referring Willistine Ferrall:  Cathlean Cower Primary Meosha Castanon:  Cathlean Cower, MD  CC:  sob w/walking....edema/feet/legs....chest discomfort...pt has lost 11 lb since 09/05/09 says he is trying to lose weight.  History of Present Illness: Mr. Eckstein comes in today for evaluation and management of his severe three-vessel disease. He we are treating him medically.  Other than his baseline dyspnea on exertion no changes. He was able to get through his surgery in February I cleared him for.  He says he would like to come in next time it was smoking. I told him that if he did I would buy his lunch.  Current Medications (verified): 1)  Carisoprodol 350 Mg Tabs (Carisoprodol) .... 4 Tabs Daily As Needed 2)  Glucotrol Xl 10 Mg Tb24 (Glipizide) .... Take 1 Tablet By Mouth Twice A Day 3)  Lisinopril 20 Mg Tabs (Lisinopril) .... Take 1 Tablet By Mouth Once A Day 4)  Symbicort 160-4.5 Mcg/act Aero (Budesonide-Formoterol Fumarate) .... Inhale 2 Puff Using Inhaler Twice A Day 5)  Alprazolam 0.25 Mg  Tabs (Alprazolam) .Marland Kitchen.. 1 By Mouth Three Times A Day As Needed 6)  Pravastatin Sodium 80 Mg Tabs (Pravastatin Sodium) .Marland Kitchen.. 1 By Mouth Once Daily 7)  Multivitamins   Tabs (Multiple Vitamin) .Marland Kitchen.. 1 By Mouth Qd 8)  Proair Hfa 108 (90 Base) Mcg/act  Aers (Albuterol Sulfate) .... Use As Directed 2 Puffs Qid Prn 9)  Oxycontin 80 Mg Xr12h-Tab (Oxycodone Hcl) .... 3 Tabs Qam..3 Tbas At Lunch..2 Tabs At Bedtime 10)  Omeprazole 20 Mg  Cpdr (Omeprazole) .... 2 By Mouth Qd 11)  Citalopram Hydrobromide 40 Mg  Tabs (Citalopram Hydrobromide) .Marland Kitchen.. 1 By Mouth Once Daily 12)  Adult Aspirin Low Strength 81 Mg  Tbdp (Aspirin) .Marland Kitchen.. 1 By Mouth Qd 13)  Metformin Hcl 500 Mg  Tabs (Metformin Hcl) .... 2 By Mouth Qam and 1 By Mouth Q Pm 14)  Oxygen 2l At Night 15)  Hydrocodone-Acetaminophen 10-650 Mg Tabs (Hydrocodone-Acetaminophen) .Marland Kitchen.. 1 Tab Four Times Daily 16)  Nitrostat 0.4 Mg Subl  (Nitroglycerin) .... Use As Directed As Needed 17)  Zolpidem Tartrate 10 Mg Tabs (Zolpidem Tartrate) .Marland Kitchen.. 1po At Bedtime As Needed 18)  Ventolin Hfa 108 (90 Base) Mcg/act Aers (Albuterol Sulfate) .... Prn 19)  Ferrous Sulfate 325 (65 Fe) Mg  Tabs (Ferrous Sulfate) .... One Tablet By Mouth Once Daily  Allergies: 1)  ! Nubain 2)  ! Morphine 3)  ! Prednisone 4)  ! * Effexor Xr  Past History:  Past Medical History: Last updated: 01/17/2009 TOBACCO USER (ICD-305.1) WEIGHT LOSS (ICD-783.21) CAD (ICD-414.00) ANGINA PECTORIS (ICD-413.9) PERIPHERAL VASCULAR DISEASE (ICD-443.9) HYPERTENSION (ICD-401.9) HYPERLIPIDEMIA (ICD-272.4) GERD (ICD-530.81) ANXIETY (ICD-300.00) CONTUSION, LEFT CHEST WALL (ICD-922.1) DYSPHAGIA (ICD-787.29) ABDOMINAL PAIN -GENERALIZED (ICD-789.07) OSTEOARTHRITIS (ICD-715.90) ASTHMA (ICD-493.90) ANEMIA-IRON DEFICIENCY (ICD-280.9) MEMORY LOSS (ICD-780.93) DIABETES MELLITUS, TYPE II (ICD-250.00) INSOMNIA-SLEEP DISORDER-UNSPEC (ICD-780.52) DEGENERATIVE DISC DISEASE, LUMBAR SPINE (ICD-722.52) SPECIAL SCREENING MALIG NEOPLASMS OTHER SITES (ICD-V76.49) CHRONIC OBSTRUCTIVE PULMONARY DISEASE, ACUTE EXACERBATION (ICD-491.21) EMPHYSEMA (ICD-492.8) OBSTRUCTIVE SLEEP APNEA (ICD-327.23) HYPERSOMNIA (ICD-780.54) FATIGUE (ICD-780.79) ABDOMINAL PAIN, UNSPECIFIED SITE (ICD-789.00) URINARY HESITANCY (ICD-788.64) LOW BACK PAIN (ICD-724.2) DEPRESSION (ICD-311) BENIGN PROSTATIC HYPERTROPHY (ICD-600.00) BACK PAIN, LUMBAR, CHRONIC (ICD-724.2) SPINAL STENOSIS (ICD-724.00) HERNIATED DISC (ICD-722.2) DIABETES MELLITUS (ICD-250.00) COLONIC POLYPS (ICD-211.3) DIVERTICULOSIS, COLON (ICD-562.10) ESOPHAGEAL STRICTURE (ICD-530.3)    Past Surgical History: Last updated: 07/26/2008 Coronary artery bypass graft - 2V s/p  bilat knee replacements Hemorrhoidectomy Carpal tunnel release - bilat PTCA/stent - 1998 and 1999 s/p right  shoulder arthroscopic surgury 11/05  Family  History: Last updated: 06/24/2008 heart disease stroke - father at 82 yo DM HTN Family History High cholesterol alzheimers-mother daughter with renal cancer  emphysema: father allergies: mother, father, siblings, children astha: father  Social History: Last updated: 06/24/2008 Married Current Smoker 1ppd 30year hx Alcohol 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   Head:  normocephalic and atraumatic Eyes:  PERRLA/EOM intact; conjunctiva and lids normal. Neck:  Neck supple, no JVD. No masses, thyromegaly or abnormal cervical nodes. Lungs:  Ciro Backer for rhonchi throughout Heart:  soft S1-S2, regular rate and rhythm, no murmur. Carotids equal bilaterally without obvious bruits Msk:  decreased ROM.  using cane Pulses:  diminished but present in the lower extremities Extremities:  trace left pedal edema and trace right pedal edema.   Neurologic:  Alert and oriented x 3. Skin:  Intact without lesions or rashes. Psych:  Normal affect.   EKG  Procedure date:  11/14/2009  Findings:      normal sinus rhythm, incomplete right bundle, no acute changes  Impression & Recommendations:  Problem # 1:  CAD (ICD-414.00) Assessment Unchanged  His updated medication list for this problem includes:    Lisinopril 20 Mg Tabs (Lisinopril) .Marland Kitchen... Take 1 tablet by mouth once a day    Adult Aspirin Low Strength 81 Mg Tbdp (Aspirin) .Marland Kitchen... 1 by mouth  qd    Nitrostat 0.4 Mg Subl (Nitroglycerin) ..... Use as directed as needed  Orders: EKG w/ Interpretation (93000)  Problem # 2:  HYPERTENSION (ICD-401.9) Assessment: Improved  His updated medication list for this problem includes:    Lisinopril 20 Mg Tabs (Lisinopril) .Marland Kitchen... Take 1 tablet by mouth once a day    Adult Aspirin Low Strength 81 Mg Tbdp (Aspirin) .Marland Kitchen... 1 by mouth qd  Orders: EKG w/ Interpretation (93000)  Problem # 3:  HYPERLIPIDEMIA (ICD-272.4)  His updated medication list for this problem includes:    Pravastatin Sodium 80 Mg Tabs (Pravastatin sodium) .Marland Kitchen... 1 by mouth once daily  Problem # 4:  DIABETES MELLITUS, TYPE II (ICD-250.00)  His updated medication list for this problem includes:    Glucotrol Xl 10 Mg Tb24 (Glipizide) .Marland Kitchen... Take 1 tablet by mouth twice a day    Lisinopril 20 Mg Tabs (Lisinopril) .Marland Kitchen... Take 1 tablet by mouth once a day    Adult Aspirin Low Strength 81 Mg Tbdp (Aspirin) .Marland Kitchen... 1 by mouth qd    Metformin Hcl 500 Mg Tabs (Metformin hcl) .Marland Kitchen... 2 by mouth qam and 1 by mouth q pm  Problem # 5:  OBSTRUCTIVE SLEEP APNEA (ICD-327.23) Assessment: Unchanged  Problem # 6:  TOBACCO USER (ICD-305.1) Assessment: Unchanged ato quit  Patient Instructions: 1)  Your physician recommends that you schedule a follow-up appointment in: 1 year with Dr. [name] 2)  Your physician  recommends that you continue on your current medications as directed. Please refer to the Current Medication list given to you today.

## 2010-05-10 NOTE — Progress Notes (Signed)
Summary: Ambien   Phone Note Call from Patient   Summary of Call: Pt says that Ambien CR is not covered. Insurance would like generic. Please advise.  Initial call taken by: Charlsie Quest, [place],  September 08, 2009 3:57 PM  Follow-up for Phone Call        he already was given the generic ambien cr Follow-up by: Biagio Borg MD,  September 08, 2009 4:38 PM  Additional Follow-up for Phone Call Additional follow up Details #1::        Ambien CR is not generic BUT per pharmacy, they will cover Generic zolpidem 5 or 10mg . Pt has previously been on 10mg  at bedtime. Ok to change back to 10mg  1 at bedtime? I can call in refill. Additional Follow-up by: Charlsie Quest, [place],  September 08, 2009 5:13 PM    Additional Follow-up for Phone Call Additional follow up Details #2::    ambien CR is generic now and has been for several weeks and I have prescribed to many other patients with no call backs  to help this pharmacist who seems to be having trouble with this, ok to change to 10 mg zolpidem as needed   done hardcopy to LIM side B - dahlia  Follow-up by: Biagio Borg MD,  September 08, 2009 5:16 PM  Additional Follow-up for Phone Call Additional follow up Details #3:: Details for Additional Follow-up Action Taken: Rx faxed to [place] Additional Follow-up by: Crissie Sickles, CMA,  September 11, 2009 9:25 AM  New/Updated Medications: ZOLPIDEM TARTRATE 10 MG TABS (ZOLPIDEM TARTRATE) 1po at bedtime as needed Prescriptions: ZOLPIDEM TARTRATE 10 MG TABS (ZOLPIDEM TARTRATE) 1po at bedtime as needed  #30 x 5   Entered and Authorized by:   Biagio Borg MD   Signed by:   Biagio Borg MD on 09/08/2009   Method used:   Print then Give to Patient   RxID:   [id number]

## 2010-05-10 NOTE — Letter (Signed)
Summary: [facility] Office Note  [facility] Office Note   Imported By: Sallee Provencal 06/09/2009 14:16:43  _____________________________________________________________________  External Attachment:    Type:   Image     Comment:   External Document

## 2010-05-10 NOTE — Letter (Signed)
Summary: CMN for Oxygen/[facility]  CMN for Oxygen/[facility]   Imported By: Phillis Knack 05/31/2009 07:49:48  _____________________________________________________________________  External Attachment:    Type:   Image     Comment:   External Document

## 2010-05-10 NOTE — Medication Information (Signed)
Summary: [facility]   Imported By: Bubba Hales 05/03/2010 09:11:28  _____________________________________________________________________  External Attachment:    Type:   Image     Comment:   External Document

## 2010-05-10 NOTE — Assessment & Plan Note (Signed)
Summary: rov. surgical clearance circumcison and right sperm/ gd   Visit Type:  surg clearance Referring Provider:  Cathlean Cower Primary Provider:  Cathlean Cower, MD  CC:  pt is here for surg clearance for circumcison and right spermatolcelectomy..sob.Marland Kitchenedema/feet....denies any cp .  History of Present Illness: [name] is a 70 year old gentleman who comes today to be cleared for surgery with Dr. Terance Hart. He is having a lot of problems with phimosis and needs a circumcision. Unfortunately, he continues to be negligent of his healthcare to the point that even this would be a significant risk of surgery.  Specifically, he has severe coronary artery disease were treated medically. In addition he has severe COPD with chronic bronchitis from continued heavy smoking. He's been advised to quit a number of years. He also has severe peripheral vascular disease particularly microvascular disease of his feet. He is a poorly controlled diabetic.  He has his baseline shortness of breath, tachycardia short of breath just in the room. He is having no true angina.   Current Medications (verified): 1)  Carisoprodol 350 Mg Tabs (Carisoprodol) .... Take 1 To 2 Tabs By Mouth Daily As Needed 2)  Glucotrol Xl 10 Mg Tb24 (Glipizide) .... Take 1 Tablet By Mouth Twice A Day 3)  Lisinopril 20 Mg Tabs (Lisinopril) .... Take 1 Tablet By Mouth Once A Day 4)  Symbicort 160-4.5 Mcg/act Aero (Budesonide-Formoterol Fumarate) .... Inhale 2 Puff Using Inhaler Twice A Day 5)  Alprazolam 0.25 Mg  Tabs (Alprazolam) .Marland Kitchen.. 1 By Mouth Three Times A Day As Needed 6)  Pravastatin Sodium 80 Mg Tabs (Pravastatin Sodium) .Marland Kitchen.. 1 By Mouth Once Daily 7)  Multivitamins   Tabs (Multiple Vitamin) .Marland Kitchen.. 1 By Mouth Qd 8)  Proair Hfa 108 (90 Base) Mcg/act  Aers (Albuterol Sulfate) .... Use As Directed 2 Puffs Qid Prn 9)  Oxycontin 80 Mg Xr12h-Tab (Oxycodone Hcl) .... 3 Tabs Qam..3 Tbas At Lunch..2 Tabs At Bedtime 10)  Omeprazole 20 Mg  Cpdr  (Omeprazole) .... 2 By Mouth Qd 11)  Citalopram Hydrobromide 40 Mg  Tabs (Citalopram Hydrobromide) .Marland Kitchen.. 1 By Mouth Once Daily 12)  Adult Aspirin Low Strength 81 Mg  Tbdp (Aspirin) .Marland Kitchen.. 1 By Mouth Qd 13)  Metformin Hcl 500 Mg  Tabs (Metformin Hcl) .... 2 By Mouth Qam and 1 By Mouth Q Pm 14)  Oxygen 2l At Night 15)  Hydrocodone-Acetaminophen 10-650 Mg Tabs (Hydrocodone-Acetaminophen) .Marland Kitchen.. 1 Tab Four Times Daily 16)  Nitrostat 0.4 Mg Subl (Nitroglycerin) .... Use As Directed As Needed 17)  Zolpidem Tartrate 10 Mg Tabs (Zolpidem Tartrate) .Marland Kitchen.. 1 By Mouth At Bedtime As Needed 18)  Ventolin Hfa 108 (90 Base) Mcg/act Aers (Albuterol Sulfate) .... Prn 19)  Ferrous Sulfate 325 (65 Fe) Mg  Tabs (Ferrous Sulfate) .... One Tablet By Mouth Once Daily  Allergies: 1)  ! Nubain 2)  ! Morphine 3)  ! Prednisone 4)  ! * Effexor Xr  Past History:  Past Medical History: Last updated: 01/17/2009 TOBACCO USER (ICD-305.1) WEIGHT LOSS (ICD-783.21) CAD (ICD-414.00) ANGINA PECTORIS (ICD-413.9) PERIPHERAL VASCULAR DISEASE (ICD-443.9) HYPERTENSION (ICD-401.9) HYPERLIPIDEMIA (ICD-272.4) GERD (ICD-530.81) ANXIETY (ICD-300.00) CONTUSION, LEFT CHEST Anniah Glick (ICD-922.1) DYSPHAGIA (ICD-787.29) ABDOMINAL PAIN -GENERALIZED (ICD-789.07) OSTEOARTHRITIS (ICD-715.90) ASTHMA (ICD-493.90) ANEMIA-IRON DEFICIENCY (ICD-280.9) MEMORY LOSS (ICD-780.93) DIABETES MELLITUS, TYPE II (ICD-250.00) INSOMNIA-SLEEP DISORDER-UNSPEC (ICD-780.52) DEGENERATIVE DISC DISEASE, LUMBAR SPINE (ICD-722.52) SPECIAL SCREENING MALIG NEOPLASMS OTHER SITES (ICD-V76.49) CHRONIC OBSTRUCTIVE PULMONARY DISEASE, ACUTE EXACERBATION (ICD-491.21) EMPHYSEMA (ICD-492.8) OBSTRUCTIVE SLEEP APNEA (ICD-327.23) HYPERSOMNIA (ICD-780.54) FATIGUE (ICD-780.79) ABDOMINAL PAIN, UNSPECIFIED SITE (ICD-789.00) URINARY HESITANCY (ICD-788.64)  LOW BACK PAIN (ICD-724.2) DEPRESSION (ICD-311) BENIGN PROSTATIC HYPERTROPHY (ICD-600.00) BACK PAIN, LUMBAR, CHRONIC  (ICD-724.2) SPINAL STENOSIS (ICD-724.00) HERNIATED DISC (ICD-722.2) DIABETES MELLITUS (ICD-250.00) COLONIC POLYPS (ICD-211.3) DIVERTICULOSIS, COLON (ICD-562.10) ESOPHAGEAL STRICTURE (ICD-530.3)    Past Surgical History: Last updated: 07/26/2008 Coronary artery bypass graft - 2V s/p  bilat knee replacements Hemorrhoidectomy Carpal tunnel release - bilat PTCA/stent - 1998 and 1999 s/p right shoulder arthroscopic surgury 11/05  Family History: Last updated: 06/24/2008 heart disease stroke - father at 49 yo DM HTN Family History High cholesterol alzheimers-mother daughter with renal cancer  emphysema: father allergies: mother, father, siblings, children astha: father  Social History: Last updated: 06/24/2008 Married Current Smoker 1ppd 30year hx 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   Head:  normocephalic and atraumatic Eyes:  sclera injected Neck:  Neck supple, no JVD. No masses, thyromegaly or abnormal cervical nodes. Lungs:  decreased breath sounds throughout inspiratory start or rhonchi Heart:  poorly appreciated PMI, soft S1-S2, regular rate and rhythm Msk:  decreased ROM.   Pulses:  dorsalis pedis and posterior tibial 1+ over 4+, dependent rubor with reduced capillary reflex Extremities:  1+ left pedal edema and 1+ right pedal edema.   Neurologic:  Alert and oriented x 3. Skin:  Intact without lesions or rashes. Psych:  Normal affect.   EKG  Procedure  date:  05/23/2009  Findings:      normal sinus rhythm with PACs, incomplete right bundle, ST segment changes inferiorly, no significant change.  Impression & Recommendations:  Problem # 1:  CAD (ICD-414.00) Assessment Unchanged He is at very high risk for general anesthesia or being put to sleep. This because he has severe coronary artery disease, unknown anatomy at this point in time, severe COPD with active bronchitis and smoking, peripheral vascular disease, and diabetes. I put a call into Dr. Terance Hart and will send him a copy of this note. I would recommend local anesthesia if at all possible. I had long discussion with the patient and his wife. They understand. His updated medication list for this problem includes:    Lisinopril 20 Mg Tabs (Lisinopril) .Marland Kitchen... Take 1 tablet by mouth once a day    Adult Aspirin Low Strength 81 Mg Tbdp (Aspirin) .Marland Kitchen... 1 by mouth qd    Nitrostat 0.4 Mg Subl (Nitroglycerin) ..... Use as directed as needed  Orders: EKG w/ Interpretation (93000)  Problem # 2:  PERIPHERAL VASCULAR DISEASE (ICD-443.9) Assessment: Deteriorated  Problem # 3:  TOBACCO USER (ICD-305.1) Assessment: Unchanged  Problem # 4:  CHRONIC OBSTRUCTIVE PULMONARY DISEASE, ACUTE EXACERBATION (ICD-491.21) Assessment: Deteriorated  The following medications were removed from the medication list:    Cephalexin 500 Mg Caps (Cephalexin) .Marland Kitchen... 1 by mouth three times a day His updated medication list for this problem includes:    Symbicort 160-4.5 Mcg/act Aero (Budesonide-formoterol fumarate) ..... Inhale 2 puff using inhaler twice  a day    Proair Hfa 108 (90 Base) Mcg/act Aers (Albuterol sulfate) ..... Use as directed 2 puffs qid prn    Ventolin Hfa 108 (90 Base) Mcg/act Aers (Albuterol sulfate) .Marland Kitchen... Prn  The following medications were removed from the medication list:    Cephalexin 500 Mg Caps (Cephalexin) .Marland Kitchen... 1 by mouth three times a day His updated medication list for this problem  includes:    Symbicort 160-4.5 Mcg/act Aero (Budesonide-formoterol fumarate) ..... Inhale 2 puff using inhaler twice a day    Proair Hfa 108 (90 Base) Mcg/act Aers (Albuterol sulfate) ..... Use as directed 2 puffs qid prn    Ventolin Hfa 108 (90 Base) Mcg/act Aers (Albuterol sulfate) .Marland Kitchen... Prn  Patient Instructions: 1)  Your physician recommends that you schedule a follow-up appointment in: [place] DUE  AUG 2011 2)  Your physician recommends that you continue on your current medications as directed. Please refer to the Current Medication list given to you today.

## 2010-05-10 NOTE — Letter (Signed)
Summary: DME WC batteries & charger/[facility]  DME WC batteries & charger/[facility]   Imported By: Bubba Hales 04/27/2009 10:23:01  _____________________________________________________________________  External Attachment:    Type:   Image     Comment:   External Document

## 2010-05-10 NOTE — Medication Information (Signed)
Summary: [facility]   Imported By: Bubba Hales 05/03/2010 09:35:43  _____________________________________________________________________  External Attachment:    Type:   Image     Comment:   External Document

## 2010-05-10 NOTE — Letter (Signed)
Summary: Generic Letter  Press photographer, [place]  [address] [place]   [place], [place] [address]   Phone: [phone]  Fax: [phone]        May 23, 2009 MRN: [id number]    [name] [address] [place], [place]  [address]    DR LLOYD PETERSON, ABOVE NAMED PT IS SCHEDULED  FOR [place]. ON 06/02/09.PER DR WALL ONLY RECOMMENDS LOCAL ANESTHIA         Sincerely,  THOMAS WALL MD/ Devra Dopp, LPN  This letter has been electronically signed by your physician.

## 2010-05-10 NOTE — Assessment & Plan Note (Signed)
Summary: FLU VAC  [facility]  [place]  Nurse Visit   Vital Signs:  Patient profile:   70 year old male Temp:     97.6 degrees F oral  Vitals Entered By: Jonathon Resides, [facility]) (January 04, 2010 2:18 PM)  Allergies: 1)  ! Nubain 2)  ! Morphine 3)  ! Prednisone 4)  ! * Effexor Xr  Orders Added: 1)  Flu Vaccine 81yrs + MEDICARE PATIENTS [Q2039] 2)  Administration Flu vaccine - MCR [id number] .[id number]   Flu Vaccine Consent Questions     Do you have a history of severe allergic reactions to this vaccine? no    Any prior history of allergic reactions to egg and/or gelatin? no    Do you have a sensitivity to the preservative Thimersol? no    Do you have a past history of Guillan-Barre Syndrome? no    Do you currently have an acute febrile illness? no    Have you ever had a severe reaction to latex? no    Vaccine information given and explained to patient? yes    Are you currently pregnant? no    Lot Number:[id number]   Exp Date:10/06/2010   Site Given  Left Deltoid IM Jonathon Resides, [facility])  January 04, 2010 2:19 PM

## 2010-05-10 NOTE — Letter (Signed)
Summary: [facility] [place] [place]   Imported By: Sallee Provencal 07/10/2009 12:30:05  _____________________________________________________________________  External Attachment:    Type:   Image     Comment:   External Document

## 2010-05-10 NOTE — Letter (Signed)
Summary: CMN for Oxygen/[facility]  CMN for Oxygen/[facility]   Imported By: Phillis Knack 06/12/2009 08:43:19  _____________________________________________________________________  External Attachment:    Type:   Image     Comment:   External Document

## 2010-05-10 NOTE — Progress Notes (Signed)
Summary: Medication  Phone Note From Pharmacy   Caller: [facility]  [address] [phone]* Summary of Call: pharmacy stated pt was told to stop Ambien and start Ambien CR, but pt. does not have a rx for Ambien CR. Pt is requesting this prescription. Initial call taken by: Sharon Seller,  September 06, 2009 10:06 AM  Follow-up for Phone Call        sorry, must have been oversight - done hardcopy to [facility] side B - dahlia  Follow-up by: Biagio Borg MD,  September 06, 2009 1:32 PM  Additional Follow-up for Phone Call Additional follow up Details #1::        Rx faxed to pharmacy Additional Follow-up by: Crissie Sickles, CMA,  September 06, 2009 1:39 PM    New/Updated Medications: ZOLPIDEM TARTRATE 12.5 MG CR-TABS (ZOLPIDEM TARTRATE) 1po at bedtime as needed Prescriptions: ZOLPIDEM TARTRATE 12.5 MG CR-TABS (ZOLPIDEM TARTRATE) 1po at bedtime as needed  #30 x 5   Entered and Authorized by:   Biagio Borg MD   Signed by:   Biagio Borg MD on 09/06/2009   Method used:   Print then Give to Patient   RxID:   [id number]

## 2010-05-10 NOTE — Assessment & Plan Note (Signed)
Summary: URI/NWS   Vital Signs:  Patient profile:   70 year old male Height:      68.5 inches Weight:      200.50 pounds BMI:     30.15 O2 Sat:      93 % on Room air Temp:     98.6 degrees F oral Pulse rate:   47 / minute BP sitting:   98 / 60  (left arm) Cuff size:   regular  Vitals Entered ByShirlean Mylar Ewing (Sep 05, 2009 2:59 PM)  O2 Flow:  Room air  CC: congestion, stomach problems/RE   Primary Care Provider:  Cathlean Cower, MD  CC:  congestion and stomach problems/RE.  History of Present Illness: here with acute onset 3 days fever, ST, prod cough with greenish sputum, and increased mild wheezing and sob/doe.  Pt denies CP,  orthopnea, pnd, worsening LE edema, palps, dizziness or syncope .  Pt denies new neuro symptoms such as headache, facial or extremity weakness  Has had increased trouble sleeping as well, cant seem to sleep through the night on current meds;  also with some recent constipation where stomach seems to get hard and crampy, better with BM; no n/v, bladder change.  Pt denies polydipsia, polyuria, or low sugar symptoms such as shakiness improved with eating.  Overall good compliance with meds, trying to follow low chol, DM diet, wt stable, little excercise however   Problems Prior to Update: 1)  Phimosis  (ICD-605) 2)  Tobacco User  (ICD-305.1) 3)  Weight Loss  (ICD-783.21) 4)  Cad  (ICD-414.00) 5)  Angina Pectoris  (ICD-413.9) 6)  Peripheral Vascular Disease  (ICD-443.9) 7)  Hypertension  (ICD-401.9) 8)  Hyperlipidemia  (ICD-272.4) 9)  Gerd  (ICD-530.81) 10)  Anxiety  (ICD-300.00) 11)  Contusion, Left Chest Wall  (ICD-922.1) 12)  Dysphagia  (ICD-787.29) 13)  Abdominal Pain -generalized  (ICD-789.07) 14)  Osteoarthritis  (ICD-715.90) 15)  Asthma  (ICD-493.90) 16)  Anemia-iron Deficiency  (ICD-280.9) 17)  Memory Loss  (ICD-780.93) 18)  Diabetes Mellitus, Type II  (ICD-250.00) 19)  Insomnia-sleep Disorder-unspec  (ICD-780.52) 20)  Degenerative Disc Disease,  Lumbar Spine  (ICD-722.52) 21)  Special Screening Malig Neoplasms Other Sites  (ICD-V76.49) 22)  Chronic Obstructive Pulmonary Disease, Acute Exacerbation  (ICD-491.21) 23)  Emphysema  (ICD-492.8) 24)  Obstructive Sleep Apnea  (ICD-327.23) 25)  Hypersomnia  (ICD-780.54) 26)  Fatigue  (ICD-780.79) 27)  Abdominal Pain, Unspecified Site  (ICD-789.00) 28)  Urinary Hesitancy  (ICD-788.64) 29)  Low Back Pain  (ICD-724.2) 30)  Depression  (ICD-311) 31)  Benign Prostatic Hypertrophy  (ICD-600.00) 32)  Back Pain, Lumbar, Chronic  (ICD-724.2) 33)  Spinal Stenosis  (ICD-724.00) 34)  Herniated Disc  (ICD-722.2) 35)  Diabetes Mellitus  (ICD-250.00) 36)  Colonic Polyps  (ICD-211.3) 37)  Diverticulosis, Colon  (ICD-562.10) 38)  Esophageal Stricture  (ICD-530.3)  Medications Prior to Update: 1)  Carisoprodol 350 Mg Tabs (Carisoprodol) .... Take 1 To 2 Tabs By Mouth Daily As Needed 2)  Glucotrol Xl 10 Mg Tb24 (Glipizide) .... Take 1 Tablet By Mouth Twice A Day 3)  Lisinopril 20 Mg Tabs (Lisinopril) .... Take 1 Tablet By Mouth Once A Day 4)  Symbicort 160-4.5 Mcg/act Aero (Budesonide-Formoterol Fumarate) .... Inhale 2 Puff Using Inhaler Twice A Day 5)  Alprazolam 0.25 Mg  Tabs (Alprazolam) .Marland Kitchen.. 1 By Mouth Three Times A Day As Needed 6)  Pravastatin Sodium 80 Mg Tabs (Pravastatin Sodium) .Marland Kitchen.. 1 By Mouth Once Daily 7)  Multivitamins   Tabs (  Multiple Vitamin) .Marland Kitchen.. 1 By Mouth Qd 8)  Proair Hfa 108 (90 Base) Mcg/act  Aers (Albuterol Sulfate) .... Use As Directed 2 Puffs Qid Prn 9)  Oxycontin 80 Mg Xr12h-Tab (Oxycodone Hcl) .... 3 Tabs Qam..3 Tbas At Lunch..2 Tabs At Bedtime 10)  Omeprazole 20 Mg  Cpdr (Omeprazole) .... 2 By Mouth Qd 11)  Citalopram Hydrobromide 40 Mg  Tabs (Citalopram Hydrobromide) .Marland Kitchen.. 1 By Mouth Once Daily 12)  Adult Aspirin Low Strength 81 Mg  Tbdp (Aspirin) .Marland Kitchen.. 1 By Mouth Qd 13)  Metformin Hcl 500 Mg  Tabs (Metformin Hcl) .... 2 By Mouth Qam and 1 By Mouth Q Pm 14)  Oxygen 2l At  Night 15)  Hydrocodone-Acetaminophen 10-650 Mg Tabs (Hydrocodone-Acetaminophen) .Marland Kitchen.. 1 Tab Four Times Daily 16)  Nitrostat 0.4 Mg Subl (Nitroglycerin) .... Use As Directed As Needed 17)  Zolpidem Tartrate 10 Mg Tabs (Zolpidem Tartrate) .Marland Kitchen.. 1 By Mouth At Bedtime As Needed 18)  Ventolin Hfa 108 (90 Base) Mcg/act Aers (Albuterol Sulfate) .... Prn 19)  Ferrous Sulfate 325 (65 Fe) Mg  Tabs (Ferrous Sulfate) .... One Tablet By Mouth Once Daily  Current Medications (verified): 1)  Carisoprodol 350 Mg Tabs (Carisoprodol) .... Take 1 To 2 Tabs By Mouth Daily As Needed 2)  Glucotrol Xl 10 Mg Tb24 (Glipizide) .... Take 1 Tablet By Mouth Twice A Day 3)  Lisinopril 20 Mg Tabs (Lisinopril) .... Take 1 Tablet By Mouth Once A Day 4)  Symbicort 160-4.5 Mcg/act Aero (Budesonide-Formoterol Fumarate) .... Inhale 2 Puff Using Inhaler Twice A Day 5)  Alprazolam 0.25 Mg  Tabs (Alprazolam) .Marland Kitchen.. 1 By Mouth Three Times A Day As Needed 6)  Pravastatin Sodium 80 Mg Tabs (Pravastatin Sodium) .Marland Kitchen.. 1 By Mouth Once Daily 7)  Multivitamins   Tabs (Multiple Vitamin) .Marland Kitchen.. 1 By Mouth Qd 8)  Proair Hfa 108 (90 Base) Mcg/act  Aers (Albuterol Sulfate) .... Use As Directed 2 Puffs Qid Prn 9)  Oxycontin 80 Mg Xr12h-Tab (Oxycodone Hcl) .... 3 Tabs Qam..3 Tbas At Lunch..2 Tabs At Bedtime 10)  Omeprazole 20 Mg  Cpdr (Omeprazole) .... 2 By Mouth Qd 11)  Citalopram Hydrobromide 40 Mg  Tabs (Citalopram Hydrobromide) .Marland Kitchen.. 1 By Mouth Once Daily 12)  Adult Aspirin Low Strength 81 Mg  Tbdp (Aspirin) .Marland Kitchen.. 1 By Mouth Qd 13)  Metformin Hcl 500 Mg  Tabs (Metformin Hcl) .... 2 By Mouth Qam and 1 By Mouth Q Pm 14)  Oxygen 2l At Night 15)  Hydrocodone-Acetaminophen 10-650 Mg Tabs (Hydrocodone-Acetaminophen) .Marland Kitchen.. 1 Tab Four Times Daily 16)  Nitrostat 0.4 Mg Subl (Nitroglycerin) .... Use As Directed As Needed 17)  Zolpidem Tartrate 10 Mg Tabs (Zolpidem Tartrate) .Marland Kitchen.. 1po At Bedtime As Needed 18)  Ventolin Hfa 108 (90 Base) Mcg/act Aers (Albuterol  Sulfate) .... Prn 19)  Ferrous Sulfate 325 (65 Fe) Mg  Tabs (Ferrous Sulfate) .... One Tablet By Mouth Once Daily 20)  Cephalexin 500 Mg Caps (Cephalexin) .Marland Kitchen.. 1 By Mouth Three Times A Day 21)  Prednisone 10 Mg Tabs (Prednisone) .... 4po Qd For 3days, Then 3po Qd For 3days, Then 2po Qd For 3days, Then 1po Qd For 3 Days, Then Stop 22)  Hydrocodone-Homatropine 5-1.5 Mg/37ml Syrp (Hydrocodone-Homatropine) .Marland Kitchen.. 1 Tsp By Mouth Q 6 Hrs As Needed  Allergies (verified): 1)  ! Nubain 2)  ! Morphine 3)  ! Prednisone 4)  ! * Effexor Xr  Past History:  Past Medical History: Last updated: 01/17/2009 TOBACCO USER (ICD-305.1) WEIGHT LOSS (ICD-783.21) CAD (ICD-414.00) ANGINA PECTORIS (ICD-413.9) PERIPHERAL  VASCULAR DISEASE (ICD-443.9) HYPERTENSION (ICD-401.9) HYPERLIPIDEMIA (ICD-272.4) GERD (ICD-530.81) ANXIETY (ICD-300.00) CONTUSION, LEFT CHEST WALL (ICD-922.1) DYSPHAGIA (ICD-787.29) ABDOMINAL PAIN -GENERALIZED (ICD-789.07) OSTEOARTHRITIS (ICD-715.90) ASTHMA (ICD-493.90) ANEMIA-IRON DEFICIENCY (ICD-280.9) MEMORY LOSS (ICD-780.93) DIABETES MELLITUS, TYPE II (ICD-250.00) INSOMNIA-SLEEP DISORDER-UNSPEC (ICD-780.52) DEGENERATIVE DISC DISEASE, LUMBAR SPINE (ICD-722.52) SPECIAL SCREENING MALIG NEOPLASMS OTHER SITES (ICD-V76.49) CHRONIC OBSTRUCTIVE PULMONARY DISEASE, ACUTE EXACERBATION (ICD-491.21) EMPHYSEMA (ICD-492.8) OBSTRUCTIVE SLEEP APNEA (ICD-327.23) HYPERSOMNIA (ICD-780.54) FATIGUE (ICD-780.79) ABDOMINAL PAIN, UNSPECIFIED SITE (ICD-789.00) URINARY HESITANCY (ICD-788.64) LOW BACK PAIN (ICD-724.2) DEPRESSION (ICD-311) BENIGN PROSTATIC HYPERTROPHY (ICD-600.00) BACK PAIN, LUMBAR, CHRONIC (ICD-724.2) SPINAL STENOSIS (ICD-724.00) HERNIATED DISC (ICD-722.2) DIABETES MELLITUS (ICD-250.00) COLONIC POLYPS (ICD-211.3) DIVERTICULOSIS, COLON (ICD-562.10) ESOPHAGEAL STRICTURE (ICD-530.3)    Past Surgical History: Last updated: 07/26/2008 Coronary artery bypass graft - 2V s/p  bilat knee  replacements Hemorrhoidectomy Carpal tunnel release - bilat PTCA/stent - 1998 and 1999 s/p right shoulder arthroscopic surgury 11/05  Social History: Last updated: 06/24/2008 Married Current Smoker 1ppd 30year hx Alcohol use-no 5 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   Eyes:  vision grossly intact, pupils equal, and pupils round.   Ears:  bilat tm's mild red, sinus nontender Nose:  nasal dischargemucosal pallor and mucosal edema.   Mouth:  pharyngeal erythema and fair dentition.   Neck:  supple and no masses.   Lungs:  normal respiratory effort, R decreased breath sounds, R wheezes, L decreased breath sounds, and L wheezes.   Heart:  normal rate and regular rhythm.   Abdomen:  soft, non-tender, and normal bowel sounds.   Extremities:  no edema, no erythema    Impression & Recommendations:  Problem # 1:  CHRONIC OBSTRUCTIVE PULMONARY DISEASE, ACUTE EXACERBATION (ICD-491.21)  for depo shot today, and pred pack taper off  Orders: Depo- Medrol 40mg  (J1030) Depo- Medrol 80mg  (J1040) Admin of Therapeutic Inj  intramuscular or subcutaneous [id number])  Problem # 2:  BRONCHITIS-ACUTE (ICD-466.0)  His updated medication list for this problem includes:    Symbicort 160-4.5 Mcg/act Aero (Budesonide-formoterol fumarate) ..... Inhale 2 puff using inhaler twice a day    Proair Hfa 108 (90 Base) Mcg/act Aers (Albuterol sulfate) ..... Use as directed 2 puffs qid prn    Ventolin Hfa 108 (90 Base) Mcg/act Aers (Albuterol sulfate) .Marland Kitchen... Prn    Cephalexin 500 Mg Caps (Cephalexin) .Marland Kitchen... 1 by mouth three times a day    Hydrocodone-homatropine 5-1.5 Mg/57ml Syrp (Hydrocodone-homatropine) .Marland Kitchen... 1 tsp by mouth q 6 hrs as needed  BP today:  98/60 Prior BP: 98/60 (05/23/2009)  Labs Reviewed: K+: 5.4 (01/10/2009) Creat: : 0.8 (01/10/2009)   Chol: 178 (01/10/2009)   HDL: 32.80 (01/10/2009)   LDL: DEL (05/18/2008)   TG: 333.0 (01/10/2009) treat as above, f/u any worsening signs or symptoms   Problem # 3:  DIABETES MELLITUS, TYPE II (ICD-250.00)  His updated medication list for this problem includes:    Glucotrol Xl 10 Mg Tb24 (Glipizide) .Marland Kitchen... Take 1 tablet by mouth twice a day    Lisinopril 20 Mg Tabs (Lisinopril) .Marland Kitchen... Take 1 tablet by mouth once a day    Adult Aspirin Low Strength 81 Mg Tbdp (Aspirin) .Marland Kitchen... 1 by mouth qd    Metformin Hcl 500 Mg Tabs (Metformin hcl) .Marland Kitchen... 2 by mouth qam and 1 by mouth q pm  Labs  Reviewed: Creat: 0.8 (01/10/2009)    Reviewed HgBA1c results: 6.9 (01/10/2009)  7.1 (05/18/2008) stable overall by hx and exam, ok to continue meds/tx as is   Problem # 4:  HYPERTENSION (ICD-401.9)  His updated medication list for this problem includes:    Lisinopril 20 Mg Tabs (Lisinopril) .Marland Kitchen... Take 1 tablet by mouth once a day  BP today: 98/60 Prior BP: 98/60 (05/23/2009)  Labs Reviewed: K+: 5.4 (01/10/2009) Creat: : 0.8 (01/10/2009)   Chol: 178 (01/10/2009)   HDL: 32.80 (01/10/2009)   LDL: DEL (05/18/2008)   TG: 333.0 (01/10/2009) stable overall by hx and exam, ok to continue meds/tx as is   Problem # 5:  INSOMNIA-SLEEP DISORDER-UNSPEC (ICD-780.52)  His updated medication list for this problem includes:    Zolpidem Tartrate 10 Mg Tabs (Zolpidem tartrate) .Marland Kitchen... 1po at bedtime as needed treat as above, f/u any worsening signs or symptoms  - to change to the ER form  Complete Medication List: 1)  Carisoprodol 350 Mg Tabs (Carisoprodol) .... Take 1 to 2 tabs by mouth daily as needed 2)  Glucotrol Xl 10 Mg Tb24 (Glipizide) .... Take 1 tablet by mouth twice a day 3)  Lisinopril 20 Mg Tabs (Lisinopril) .... Take 1 tablet by mouth once a day 4)  Symbicort 160-4.5 Mcg/act Aero (Budesonide-formoterol  fumarate) .... Inhale 2 puff using inhaler twice a day 5)  Alprazolam 0.25 Mg Tabs (Alprazolam) .Marland Kitchen.. 1 by mouth three times a day as needed 6)  Pravastatin Sodium 80 Mg Tabs (Pravastatin sodium) .Marland Kitchen.. 1 by mouth once daily 7)  Multivitamins Tabs (Multiple vitamin) .Marland Kitchen.. 1 by mouth qd 8)  Proair Hfa 108 (90 Base) Mcg/act Aers (Albuterol sulfate) .... Use as directed 2 puffs qid prn 9)  Oxycontin 80 Mg Xr12h-tab (Oxycodone hcl) .... 3 tabs qam..3 tbas at lunch..2 tabs at bedtime 10)  Omeprazole 20 Mg Cpdr (Omeprazole) .... 2 by mouth qd 11)  Citalopram Hydrobromide 40 Mg Tabs (Citalopram hydrobromide) .Marland Kitchen.. 1 by mouth once daily 12)  Adult Aspirin Low Strength 81 Mg Tbdp (Aspirin) .Marland Kitchen.. 1 by mouth qd 13)  Metformin Hcl 500 Mg Tabs (Metformin hcl) .... 2 by mouth qam and 1 by mouth q pm 14)  Oxygen 2l At Night  15)  Hydrocodone-acetaminophen 10-650 Mg Tabs (Hydrocodone-acetaminophen) .Marland Kitchen.. 1 tab four times daily 16)  Nitrostat 0.4 Mg Subl (Nitroglycerin) .... Use as directed as needed 17)  Zolpidem Tartrate 10 Mg Tabs (Zolpidem tartrate) .Marland Kitchen.. 1po at bedtime as needed 18)  Ventolin Hfa 108 (90 Base) Mcg/act Aers (Albuterol sulfate) .... Prn 19)  Ferrous Sulfate 325 (65 Fe) Mg Tabs (Ferrous sulfate) .... One tablet by mouth once daily 20)  Cephalexin 500 Mg Caps (Cephalexin) .Marland Kitchen.. 1 by mouth three times a day 21)  Prednisone 10 Mg Tabs (Prednisone) .... 4po qd for 3days, then 3po qd for 3days, then 2po qd for 3days, then 1po qd for 3 days, then stop 22)  Hydrocodone-homatropine 5-1.5 Mg/51ml Syrp (Hydrocodone-homatropine) .Marland Kitchen.. 1 tsp by mouth q 6 hrs as needed  Patient Instructions: 1)  you had the steroid shot today 2)  Please take all new medications as prescribed - the antibiotic, prednisone, and cough medicine 3)  Continue all previous medications as before this visit 4)  stop the ambien 5)  start the ambien CR instead for better sleep 6)  you can also use the miralax daily for laxative since you  are on the narcotic medications 7)  Please schedule a follow-up appointment in 5 months for  your 2011 yearly medicare exam, or sooner if needed Prescriptions: HYDROCODONE-HOMATROPINE 5-1.5 MG/5ML SYRP (HYDROCODONE-HOMATROPINE) 1 tsp by mouth q 6 hrs as needed  #6 oz x 1   Entered and Authorized by:   Biagio Borg MD   Signed by:   Biagio Borg MD on 09/05/2009   Method used:   Print then Give to Patient   RxID:   [id number] PREDNISONE 10 MG TABS (PREDNISONE) 4po qd for 3days, then 3po qd for 3days, then 2po qd for 3days, then 1po qd for 3 days, then stop  #30 x 0   Entered and Authorized by:   Biagio Borg MD   Signed by:   Biagio Borg MD on 09/05/2009   Method used:   Print then Give to Patient   [id number] CEPHALEXIN 500 MG CAPS (CEPHALEXIN) 1 by mouth three times a day  #30 x 0   Entered and Authorized by:   Biagio Borg MD   Signed by:   Biagio Borg MD on 09/05/2009   Method used:   Print then Give to Patient   RxIDMN:[id number]    Medication Administration  Injection # 1:    Medication: Depo- Medrol 40mg     Diagnosis: CHRONIC OBSTRUCTIVE PULMONARY DISEASE, ACUTE EXACERBATION (ICD-491.21)    Route: IM    Site: LUOQ gluteus    Exp Date: 07/2012    Lot #: [id number]    Mfr: [employer]    Given byShirlean Mylar Ewing (Sep 05, 2009 3:44 PM)  Injection # 2:    Medication: Depo- Medrol 80mg     Diagnosis: CHRONIC OBSTRUCTIVE PULMONARY DISEASE, ACUTE EXACERBATION (ICD-491.21)    Route: IM    Site: LUOQ gluteus    Exp Date: 07/2012    Lot #: [id number]    Mfr: [employer]    Given byShirlean Mylar Ewing (Sep 05, 2009 3:44 PM)  Orders Added: 1)  Depo- Medrol 40mg  [J1030] 2)  Depo- Medrol 80mg  [J1040] 3)  Admin of Therapeutic Inj  intramuscular or subcutaneous [[id number]] 4)  Est. Patient Level IV [id number]

## 2010-05-10 NOTE — Assessment & Plan Note (Signed)
Summary: FU  STC   Vital Signs:  Patient profile:   70 year old male Height:      68.5 inches Weight:      196.25 pounds BMI:     29.51 O2 Sat:      90 % on Room air Temp:     98.5 degrees F oral Pulse rate:   55 / minute BP sitting:   100 / 58  (left arm) Cuff size:   regular  Vitals Entered By: Shirlean Mylar Ewing CMA Deborra Medina) (January 16, 2010 2:56 PM)  O2 Flow:  Room air  CC: followup/RE/wellness   Primary Care Bernadetta Roell:  Cathlean Cower, MD  CC:  followup/RE/wellness.  History of Present Illness: here for wellness;  Pt denies CP, worsening sob, doe, wheezing, orthopnea, pnd, worsening LE edema, palps, dizziness or syncope  Pt denies new neuro symptoms such as headache, facial or extremity weakness  No fever, wt loss, night sweats, loss of appetite or other constitutional symptoms  Pt denies polydipsia, polyuria, or low sugar symptoms such as shakiness improved with eating.  Overall good compliance with meds, trying to follow low chol, DM diet, wt stable, little excercise however Did have some left buttock and left thigh burrning pain type episode for several hours recently that woke him up lying flat at night, but none since.  Also had a blister like lesion to the left buttock also resolved.  Insurance suggests change of soma to generic zanaflex due to cost.  Needs refills. Denies worsening depressive symptoms, suicidal ideation or panic.  Wife very supportive.    Here for wellness Diet: Heart Healthy or DM if diabetic Physical Activities: Sedentary, walks with cane Depression/mood screen: Negative Hearing: Intact bilateral Visual Acuity: Grossly normal, gets exam yearly, wears glasses ADL's: Capable  Fall Risk: None Home Safety: Good Cognitive Impairment:  Gen appearance, affect, speech, memory, attention & motor skills grossly intact End-of-Life Planning: Advance directive - Full code/I agree   Problems Prior to Update: 1)  Preventive Health Care  (ICD-V70.0) 2)  Phimosis   (ICD-605) 3)  Tobacco User  (ICD-305.1) 4)  Weight Loss  (ICD-783.21) 5)  Cad  (ICD-414.00) 6)  Angina Pectoris  (ICD-413.9) 7)  Peripheral Vascular Disease  (ICD-443.9) 8)  Hypertension  (ICD-401.9) 9)  Hyperlipidemia  (ICD-272.4) 10)  Gerd  (ICD-530.81) 11)  Anxiety  (ICD-300.00) 12)  Contusion, Left Chest Wall  (ICD-922.1) 13)  Dysphagia  (ICD-787.29) 14)  Abdominal Pain -generalized  (ICD-789.07) 15)  Osteoarthritis  (ICD-715.90) 16)  Asthma  (ICD-493.90) 17)  Anemia-iron Deficiency  (ICD-280.9) 18)  Memory Loss  (ICD-780.93) 19)  Diabetes Mellitus, Type II  (ICD-250.00) 20)  Insomnia-sleep Disorder-unspec  (ICD-780.52) 21)  Degenerative Disc Disease, Lumbar Spine  (ICD-722.52) 22)  Special Screening Malig Neoplasms Other Sites  (ICD-V76.49) 23)  Chronic Obstructive Pulmonary Disease, Acute Exacerbation  (ICD-491.21) 24)  Emphysema  (ICD-492.8) 25)  Obstructive Sleep Apnea  (ICD-327.23) 26)  Hypersomnia  (ICD-780.54) 27)  Fatigue  (ICD-780.79) 28)  Abdominal Pain, Unspecified Site  (ICD-789.00) 29)  Urinary Hesitancy  (ICD-788.64) 30)  Low Back Pain  (ICD-724.2) 31)  Depression  (ICD-311) 32)  Benign Prostatic Hypertrophy  (ICD-600.00) 33)  Back Pain, Lumbar, Chronic  (ICD-724.2) 34)  Spinal Stenosis  (ICD-724.00) 35)  Herniated Disc  (ICD-722.2) 36)  Diabetes Mellitus  (ICD-250.00) 37)  Colonic Polyps  (ICD-211.3) 38)  Diverticulosis, Colon  (ICD-562.10) 39)  Esophageal Stricture  (ICD-530.3)  Medications Prior to Update: 1)  Carisoprodol 350 Mg Tabs (Carisoprodol) .Marland KitchenMarland KitchenMarland Kitchen  4 Tabs Daily As Needed 2)  Glucotrol Xl 10 Mg Tb24 (Glipizide) .... Take 1 Tablet By Mouth Twice A Day 3)  Lisinopril 20 Mg Tabs (Lisinopril) .... Take 1 Tablet By Mouth Once A Day 4)  Symbicort 160-4.5 Mcg/act Aero (Budesonide-Formoterol Fumarate) .... Inhale 2 Puff Using Inhaler Twice A Day 5)  Alprazolam 0.25 Mg  Tabs (Alprazolam) .Marland Kitchen.. 1 By Mouth Three Times A Day As Needed 6)  Pravastatin Sodium  80 Mg Tabs (Pravastatin Sodium) .Marland Kitchen.. 1 By Mouth Once Daily 7)  Multivitamins   Tabs (Multiple Vitamin) .Marland Kitchen.. 1 By Mouth Qd 8)  Proair Hfa 108 (90 Base) Mcg/act  Aers (Albuterol Sulfate) .... Use As Directed 2 Puffs Qid Prn 9)  Oxycontin 80 Mg Xr12h-Tab (Oxycodone Hcl) .... 3 Tabs Qam..3 Tbas At Lunch..2 Tabs At Bedtime 10)  Omeprazole 20 Mg  Cpdr (Omeprazole) .... 2 By Mouth Qd 11)  Citalopram Hydrobromide 40 Mg  Tabs (Citalopram Hydrobromide) .Marland Kitchen.. 1 By Mouth Once Daily 12)  Adult Aspirin Low Strength 81 Mg  Tbdp (Aspirin) .Marland Kitchen.. 1 By Mouth Qd 13)  Metformin Hcl 500 Mg  Tabs (Metformin Hcl) .... 2 By Mouth Qam and 1 By Mouth Q Pm 14)  Oxygen 2l At Night 15)  Hydrocodone-Acetaminophen 10-650 Mg Tabs (Hydrocodone-Acetaminophen) .Marland Kitchen.. 1 Tab Four Times Daily 16)  Nitrostat 0.4 Mg Subl (Nitroglycerin) .... Use As Directed As Needed 17)  Zolpidem Tartrate 10 Mg Tabs (Zolpidem Tartrate) .Marland Kitchen.. 1po At Bedtime As Needed 18)  Ventolin Hfa 108 (90 Base) Mcg/act Aers (Albuterol Sulfate) .... Prn 19)  Ferrous Sulfate 325 (65 Fe) Mg  Tabs (Ferrous Sulfate) .... One Tablet By Mouth Once Daily  Current Medications (verified): 1)  Tizanidine Hcl 4 Mg Tabs (Tizanidine Hcl) .Marland Kitchen.. 1-2 By Mouth Q 6 Hrs As Needed 2)  Glucotrol Xl 10 Mg Tb24 (Glipizide) .... Take 1 Tablet By Mouth Twice A Day 3)  Lisinopril 20 Mg Tabs (Lisinopril) .... Take 1 Tablet By Mouth Once A Day 4)  Symbicort 160-4.5 Mcg/act Aero (Budesonide-Formoterol Fumarate) .... Inhale 2 Puff Using Inhaler Twice A Day 5)  Alprazolam 0.25 Mg  Tabs (Alprazolam) .Marland Kitchen.. 1 By Mouth Three Times A Day As Needed 6)  Pravastatin Sodium 80 Mg Tabs (Pravastatin Sodium) .Marland Kitchen.. 1 By Mouth Once Daily 7)  Multivitamins   Tabs (Multiple Vitamin) .Marland Kitchen.. 1 By Mouth Qd 8)  Proair Hfa 108 (90 Base) Mcg/act  Aers (Albuterol Sulfate) .... Use As Directed 2 Puffs Qid Prn 9)  Oxycontin 80 Mg Xr12h-Tab (Oxycodone Hcl) .... 3 Tabs Qam..3 Tbas At Lunch..2 Tabs At Bedtime 10)  Omeprazole 20  Mg  Cpdr (Omeprazole) .... 2 By Mouth Qd 11)  Citalopram Hydrobromide 40 Mg  Tabs (Citalopram Hydrobromide) .Marland Kitchen.. 1 By Mouth Once Daily 12)  Adult Aspirin Low Strength 81 Mg  Tbdp (Aspirin) .Marland Kitchen.. 1 By Mouth Qd 13)  Metformin Hcl 500 Mg  Tabs (Metformin Hcl) .... 2 By Mouth Qam and 1 By Mouth Q Pm 14)  Oxygen 2l At Night 15)  Hydrocodone-Acetaminophen 10-650 Mg Tabs (Hydrocodone-Acetaminophen) .Marland Kitchen.. 1 Tab Four Times Daily 16)  Nitrostat 0.4 Mg Subl (Nitroglycerin) .... Use As Directed As Needed 17)  Zolpidem Tartrate 10 Mg Tabs (Zolpidem Tartrate) .Marland Kitchen.. 1po At Bedtime As Needed 18)  Ventolin Hfa 108 (90 Base) Mcg/act Aers (Albuterol Sulfate) .... Prn 19)  Ferrous Sulfate 325 (65 Fe) Mg  Tabs (Ferrous Sulfate) .... One Tablet By Mouth Once Daily  Allergies (verified): 1)  ! Nubain 2)  ! Morphine 3)  !  Prednisone 4)  ! 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  Risk Factors: Smoking Status: current (06/24/2007) Packs/Day: 1ppd (07/03/2007)  Past Medical History: TOBACCO USER (ICD-305.1) WEIGHT LOSS (ICD-783.21) CAD (ICD-414.00) ANGINA PECTORIS (ICD-413.9) PERIPHERAL VASCULAR DISEASE (ICD-443.9) HYPERTENSION (ICD-401.9) HYPERLIPIDEMIA (ICD-272.4) GERD (ICD-530.81) ANXIETY (ICD-300.00) CONTUSION, LEFT CHEST WALL (ICD-922.1) DYSPHAGIA (ICD-787.29) ABDOMINAL PAIN -GENERALIZED (ICD-789.07) OSTEOARTHRITIS (ICD-715.90) ASTHMA (ICD-493.90) ANEMIA-IRON DEFICIENCY (ICD-280.9) MEMORY LOSS (ICD-780.93) DIABETES MELLITUS, TYPE II (ICD-250.00) INSOMNIA-SLEEP DISORDER-UNSPEC (ICD-780.52) DEGENERATIVE DISC  DISEASE, LUMBAR SPINE (ICD-722.52) SPECIAL SCREENING MALIG NEOPLASMS OTHER SITES (ICD-V76.49) CHRONIC OBSTRUCTIVE PULMONARY DISEASE, ACUTE EXACERBATION (ICD-491.21) EMPHYSEMA (ICD-492.8) OBSTRUCTIVE SLEEP APNEA (ICD-327.23) HYPERSOMNIA (ICD-780.54) FATIGUE (ICD-780.79) ABDOMINAL PAIN, UNSPECIFIED SITE (ICD-789.00) URINARY HESITANCY (ICD-788.64) LOW BACK PAIN (ICD-724.2) DEPRESSION (ICD-311) BENIGN PROSTATIC HYPERTROPHY (ICD-600.00) BACK PAIN, LUMBAR, CHRONIC (ICD-724.2) SPINAL STENOSIS (ICD-724.00) HERNIATED DISC (ICD-722.2) DIABETES MELLITUS (ICD-250.00) COLONIC POLYPS (ICD-211.3) DIVERTICULOSIS, COLON (ICD-562.10) ESOPHAGEAL STRICTURE (ICD-530.3) MD roster:  [facility] clinic [facility] - Dr Wilburt Finlay                   urology - Dr Terance Hart                    Card - Dr [name]                   GI - Dr Henrene Pastor                   optho - Dr Governor Rooks - Dr Altamese Cabal  Past Surgical History: Reviewed history from 07/26/2008 and no changes required. Coronary artery bypass graft - 2V s/p  bilat knee replacements Hemorrhoidectomy Carpal tunnel release - bilat PTCA/stent - 1998 and 1999 s/p right shoulder arthroscopic surgury 11/05  Family History: Reviewed history from 06/24/2008 and no changes required. heart disease stroke - father at 8 yo DM HTN Family History High cholesterol alzheimers-mother daughter with renal cancer  emphysema: father allergies: mother, father, siblings, children astha: father  Social History: Reviewed history from 06/24/2008 and no changes required. 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         all otherwise negative per pt -  except for onoging fatigue without OSA symtpoms   Physical Exam  General:  alert and overweight-appearing Head:  normocephalic and atraumatic.   Eyes:  vision grossly intact, pupils equal, and pupils round.   Ears:  R ear normal and L ear normal.   Nose:  no external deformity and no nasal discharge.   Mouth:  no gingival abnormalities and pharynx pink and moist.   Neck:  supple and no masses.   Lungs:  normal respiratory effort, R decreased breath sounds, and L decreased breath sounds.   Heart:  normal rate  and regular rhythm.   Abdomen:  soft, non-tender, and normal bowel sounds.   Msk:  no joint tenderness and no joint swelling.   Extremities:  no edema, no erythema  Neurologic:  cranial nerves II-XII intact, strength normal in all extremities, and gait normal.  cognitive intact to orientation, recall, naming, and repetition  Skin:  color normal and no rashes.   Psych:  not depressed appearing and slightly anxious.     Impression & Recommendations:  Problem # 1:  Preventive Health Care (ICD-V70.0) Overall doing well, age appropriate education and counseling updated and referral for appropriate preventive services done unless declined, immunizations up to date or declined, diet counseling done if overweight, urged to quit smoking if smokes , most recent labs reviewed and current ordered if appropriate, ecg reviewed or declined (interpretation per ECG scanned in the EMR if done); information regarding Medicare Prevention requirements given if appropriate; speciality referrals updated as appropriate ; for aortic u/s due to hx of smoking Orders: Radiology Referral (Radiology) Medicare -1st Annual Wellness Visit [phone]) I have personally reviewed the Medicare Annual Wellness  questionnaire and have noted 1.   The patient's medical and social history 2.   Their use of alcohol, tobacco or illicit drugs 3.   Their current medications and supplements 4.   The patient's functional ability including ADL's, fall risks, home safety risks and hearing or visual             impairment. 5.   Diet and physical activities 6.   Evidence for depression or mood disorders  The patients weight, height, BMI have been recorded in the chart I have made referrals, counseling and provided education to the patient based review of the above and I have provided the pt with a written personalized care plan for preventive services.    Problem # 2:  FATIGUE (ICD-780.79) exam benign, to check labs below; follow with expectant management  Orders: TLB-BMP (Basic Metabolic Panel-BMET) (99991111) TLB-CBC Platelet - w/Differential (85025-CBCD) TLB-Hepatic/Liver Function Pnl (80076-HEPATIC) TLB-TSH (Thyroid Stimulating Hormone) (84443-TSH) TLB-Sedimentation Rate (ESR) (85652-ESR)  Problem # 3:  DIABETES MELLITUS, TYPE II (ICD-250.00)  His updated medication list for this problem includes:    Glucotrol Xl 10 Mg Tb24 (Glipizide) .Marland Kitchen... Take 1 tablet by mouth twice a day    Lisinopril 20 Mg Tabs (Lisinopril) .Marland Kitchen... Take 1 tablet by mouth once a day    Adult Aspirin Low Strength 81 Mg Tbdp (Aspirin) .Marland Kitchen... 1 by mouth qd    Metformin Hcl 500 Mg Tabs (Metformin hcl) .Marland Kitchen... 2 by mouth qam and 1 by mouth q pm  Labs Reviewed: Creat: 0.8 (01/10/2009)    Reviewed HgBA1c results: 6.9 (01/10/2009)  7.1 (05/18/2008) stable overall by hx and exam, ok to continue meds/tx as is , Pt to cont DM diet, excercise, wt loss efforts; to check labs today   Orders: TLB-Lipid Panel (80061-LIPID) TLB-Microalbumin/Creat Ratio, Urine (82043-MALB) TLB-A1C / Hgb A1C (Glycohemoglobin) (83036-A1C) Prescription Created Electronically [phone])  Problem # 4:  HYPERLIPIDEMIA (ICD-272.4)  His updated medication list for  this problem includes:    Pravastatin Sodium 80 Mg Tabs (Pravastatin sodium) .Marland Kitchen... 1 by mouth once daily  Labs Reviewed: SGOT: 17 (01/10/2009)   SGPT: 12 (01/10/2009)   HDL:32.80 (01/10/2009), 29.7 (05/18/2008)  LDL:DEL (05/18/2008), DEL (12/31/2007)  Chol:178 (01/10/2009), 159 (05/18/2008)  Trig:333.0 (01/10/2009), 247 (05/18/2008) stable overall by hx and exam, ok to continue meds/tx as is   Problem # 5:  HYPERTENSION (ICD-401.9)  His updated medication list for  this problem includes:    Lisinopril 20 Mg Tabs (Lisinopril) .Marland Kitchen... Take 1 tablet by mouth once a day  BP today: 100/58 Prior BP: 100/62 (11/14/2009)  Labs Reviewed: K+: 5.4 (01/10/2009) Creat: : 0.8 (01/10/2009)   Chol: 178 (01/10/2009)   HDL: 32.80 (01/10/2009)   LDL: DEL (05/18/2008)   TG: 333.0 (01/10/2009) stable overall by hx and exam, ok to continue meds/tx as is   Orders: TLB-Udip w/ Micro (81001-URINE)  Complete Medication List: 1)  Tizanidine Hcl 4 Mg Tabs (Tizanidine hcl) .Marland Kitchen.. 1-2 by mouth q 6 hrs as needed 2)  Glucotrol Xl 10 Mg Tb24 (Glipizide) .... Take 1 tablet by mouth twice a day 3)  Lisinopril 20 Mg Tabs (Lisinopril) .... Take 1 tablet by mouth once a day 4)  Symbicort 160-4.5 Mcg/act Aero (Budesonide-formoterol fumarate) .... Inhale 2 puff using inhaler twice a day 5)  Alprazolam 0.25 Mg Tabs (Alprazolam) .Marland Kitchen.. 1 by mouth three times a day as needed 6)  Pravastatin Sodium 80 Mg Tabs (Pravastatin sodium) .Marland Kitchen.. 1 by mouth once daily 7)  Multivitamins Tabs (Multiple vitamin) .Marland Kitchen.. 1 by mouth qd 8)  Proair Hfa 108 (90 Base) Mcg/act Aers (Albuterol sulfate) .... Use as directed 2 puffs qid prn 9)  Oxycontin 80 Mg Xr12h-tab (Oxycodone hcl) .... 3 tabs qam..3 tbas at lunch..2 tabs at bedtime 10)  Omeprazole 20 Mg Cpdr (Omeprazole) .... 2 by mouth qd 11)  Citalopram Hydrobromide 40 Mg Tabs (Citalopram hydrobromide) .Marland Kitchen.. 1 by mouth once daily 12)  Adult Aspirin Low Strength 81 Mg Tbdp (Aspirin) .Marland Kitchen.. 1 by mouth  qd 13)  Metformin Hcl 500 Mg Tabs (Metformin hcl) .... 2 by mouth qam and 1 by mouth q pm 14)  Oxygen 2l At Night  15)  Hydrocodone-acetaminophen 10-650 Mg Tabs (Hydrocodone-acetaminophen) .Marland Kitchen.. 1 tab four times daily 16)  Nitrostat 0.4 Mg Subl (Nitroglycerin) .... Use as directed as needed 17)  Zolpidem Tartrate 10 Mg Tabs (Zolpidem tartrate) .Marland Kitchen.. 1po at bedtime as needed 18)  Ventolin Hfa 108 (90 Base) Mcg/act Aers (Albuterol sulfate) .... Prn 19)  Ferrous Sulfate 325 (65 Fe) Mg Tabs (Ferrous sulfate) .... One tablet by mouth once daily  Other Orders: TLB-IBC Pnl (Iron/FE;Transferrin) (83550-IBC) TLB-B12 + Folate Pnl (82746_82607-B12/FOL) TLB-PSA (Prostate Specific Antigen) (84153-PSA)  Patient Instructions: 1)  You will be contacted about the referral(s) to: aortic ultrasound 2)  stop the soma as you have  3)  start the tizanidine as needed  4)  You are given the refills today 5)  Please go to the Lab in the basement for your blood and/or urine tests today 6)  Please call the number on the [place] for results of your testing  7)  Continue all previous medications as before this visit  8)  Please schedule a follow-up appointment in 6 months, or sooner if needed Prescriptions: ALPRAZOLAM 0.25 MG  TABS (ALPRAZOLAM) 1 by mouth three times a day as needed  #90 x 5   Entered and Authorized by:   Biagio Borg MD   Signed by:   Biagio Borg MD on 01/16/2010   Method used:   Print then Give to Patient   RxID:   [id number] PRAVASTATIN SODIUM 80 MG TABS (PRAVASTATIN SODIUM) 1 by mouth once daily  #90 x 3   Entered and Authorized by:   Biagio Borg MD   Signed by:   Biagio Borg MD on 01/16/2010   Method used:   Print then Give to Patient  [id number] LISINOPRIL 20 MG TABS (LISINOPRIL) Take 1 tablet by mouth once a day  #90 x 3   Entered and Authorized by:   Biagio Borg MD   Signed by:   Biagio Borg MD on 01/16/2010   Method used:   Print then Give to Patient    RxID:   [id number] METFORMIN HCL 500 MG  TABS (METFORMIN HCL) 2 by mouth qam and 1 by mouth q pm  #270 x 3   Entered and Authorized by:   Biagio Borg MD   Signed by:   Biagio Borg MD on 01/16/2010   Method used:   Print then Give to Patient   RxID:   [id number] TIZANIDINE HCL 4 MG TABS (TIZANIDINE HCL) 1-2 by mouth q 6 hrs as needed  #120 x 5   Entered and Authorized by:   Biagio Borg MD   Signed by:   Biagio Borg MD on 01/16/2010   Method used:   Print then Give to Patient   RxID:   [id number]

## 2010-05-10 NOTE — Letter (Signed)
Summary: [facility]   [facility]   Imported By: Rise Patience 05/12/2009 12:02:24  _____________________________________________________________________  External Attachment:    Type:   Image     Comment:   External Document

## 2010-05-10 NOTE — Letter (Signed)
Summary: [facility]  [facility]   Imported By: Phillis Knack 03/09/2010 14:04:21  _____________________________________________________________________  External Attachment:    Type:   Image     Comment:   External Document

## 2010-05-18 ENCOUNTER — Other Ambulatory Visit: Payer: Self-pay | Admitting: Family Medicine

## 2010-05-18 ENCOUNTER — Ambulatory Visit
Admission: RE | Admit: 2010-05-18 | Discharge: 2010-05-18 | Disposition: A | Discharge status: Home / Self Care | Payer: Medicare Other | Source: Ambulatory Visit | Attending: Orthopedic Surgery | Admitting: Orthopedic Surgery

## 2010-05-18 ENCOUNTER — Other Ambulatory Visit: Payer: Self-pay | Admitting: Orthopedic Surgery

## 2010-05-18 DIAGNOSIS — M25569 Pain in unspecified knee: Secondary | ICD-10-CM

## 2010-05-18 DIAGNOSIS — R059 Cough, unspecified: Secondary | ICD-10-CM

## 2010-05-18 DIAGNOSIS — R05 Cough: Secondary | ICD-10-CM

## 2010-05-31 ENCOUNTER — Ambulatory Visit (INDEPENDENT_AMBULATORY_CARE_PROVIDER_SITE_OTHER): Payer: Medicare Other | Admitting: Internal Medicine

## 2010-05-31 ENCOUNTER — Ambulatory Visit (INDEPENDENT_AMBULATORY_CARE_PROVIDER_SITE_OTHER)
Admission: RE | Admit: 2010-05-31 | Discharge: 2010-05-31 | Disposition: A | Discharge status: Home / Self Care | Payer: Medicare Other | Source: Ambulatory Visit | Attending: Internal Medicine | Admitting: Internal Medicine

## 2010-05-31 ENCOUNTER — Encounter: Payer: Self-pay | Admitting: Internal Medicine

## 2010-05-31 ENCOUNTER — Other Ambulatory Visit: Payer: Self-pay | Admitting: Internal Medicine

## 2010-05-31 ENCOUNTER — Other Ambulatory Visit: Payer: Medicare Other

## 2010-05-31 DIAGNOSIS — J441 Chronic obstructive pulmonary disease with (acute) exacerbation: Secondary | ICD-10-CM

## 2010-05-31 DIAGNOSIS — J209 Acute bronchitis, unspecified: Secondary | ICD-10-CM

## 2010-05-31 DIAGNOSIS — M5430 Sciatica, unspecified side: Secondary | ICD-10-CM | POA: Insufficient documentation

## 2010-05-31 DIAGNOSIS — E119 Type 2 diabetes mellitus without complications: Secondary | ICD-10-CM

## 2010-05-31 DIAGNOSIS — E785 Hyperlipidemia, unspecified: Secondary | ICD-10-CM

## 2010-05-31 LAB — LIPID PANEL
Cholesterol: 167 mg/dL (ref 0–200)
HDL: 32.6 mg/dL — ABNORMAL LOW (ref >39.00)
VLDL: 56.6 mg/dL — ABNORMAL HIGH (ref 0.0–40.0)

## 2010-05-31 LAB — BASIC METABOLIC PANEL
Chloride: 101 mEq/L (ref 96–112)
GFR: 115.63 mL/min (ref >60.00)
Potassium: 5.1 mEq/L (ref 3.5–5.1)
Sodium: 140 mEq/L (ref 135–145)

## 2010-05-31 LAB — HEMOGLOBIN A1C: Hgb A1c MFr Bld: 7.1 % — ABNORMAL HIGH (ref 4.6–6.5)

## 2010-06-05 NOTE — Assessment & Plan Note (Signed)
Summary: ?resp inf   Vital Signs:  Patient profile:   70 year old male Height:      68.5 inches Weight:      201.50 pounds BMI:     30.30 O2 Sat:      91 % on Room air Temp:     98.3 degrees F oral Pulse rate:   79 / minute BP sitting:   112 / 58  (left arm) Cuff size:   large  Vitals Entered By: Shirlean Mylar Ewing CMA Deborra Medina) (May 31, 2010 2:45 PM)  O2 Flow:  Room air  CC: congestion, cough and sore throat/RE   Primary Care Provider:  Cathlean Cower, MD  CC:  congestion and cough and sore throat/RE.  History of Present Illness: here wtih acute c/o 3 days onset mild to mod fever, HA, general weakness and malaise, sT, and now today prod cough greenish sputum with mild mild wheezing and sob/doe;  last episode approx 1 yr ago;  Pt denies CP,  orthopnea, pnd, worsening LE edema, palps, dizziness or syncope   Pt denies new neuro symptoms such as headache, facial or extremity weakness  Pt denies polydipsia, polyuria  Overall good compliance with meds, trying to follow low chol, DM diet, wt stable, little excercise however . CBG's in low 100s/.  No recent wt loss, night sweats, loss of appetite or other constitutional symptoms except for the above.  Overall good compliance with meds, and good tolerability.  Denies worsening depressive symptoms, suicidal ideation, or panic, though has ongoing anxiety overall stable recently.  Also c/o 6 mo recurring left lower back pain with radiation to the post left knee, only really symptomatic at night to lie down,  no other LE pain/weakness/numb, bowel or bladder change, gait change, fall or injury, wt loss or other fever.   Sees pain management on regular basis.   Problems Prior to Update: 1)  Sciatica, Left  (ICD-724.3) 2)  Bronchitis-acute  (ICD-466.0) 3)  Preventive Health Care  (ICD-V70.0) 4)  Phimosis  (ICD-605) 5)  Tobacco User  (ICD-305.1) 6)  Weight Loss  (ICD-783.21) 7)  Cad  (ICD-414.00) 8)  Angina Pectoris  (ICD-413.9) 9)  Peripheral Vascular  Disease  (ICD-443.9) 10)  Hypertension  (ICD-401.9) 11)  Hyperlipidemia  (ICD-272.4) 12)  Gerd  (ICD-530.81) 13)  Anxiety  (ICD-300.00) 14)  Contusion, Left Chest Wall  (ICD-922.1) 15)  Dysphagia  (ICD-787.29) 16)  Abdominal Pain -generalized  (ICD-789.07) 17)  Osteoarthritis  (ICD-715.90) 18)  Asthma  (ICD-493.90) 19)  Anemia-iron Deficiency  (ICD-280.9) 20)  Memory Loss  (ICD-780.93) 21)  Diabetes Mellitus, Type II  (ICD-250.00) 22)  Insomnia-sleep Disorder-unspec  (ICD-780.52) 23)  Degenerative Disc Disease, Lumbar Spine  (ICD-722.52) 24)  Special Screening Malig Neoplasms Other Sites  (ICD-V76.49) 25)  Chronic Obstructive Pulmonary Disease, Acute Exacerbation  (ICD-491.21) 26)  Emphysema  (ICD-492.8) 27)  Obstructive Sleep Apnea  (ICD-327.23) 28)  Hypersomnia  (ICD-780.54) 29)  Fatigue  (ICD-780.79) 30)  Abdominal Pain, Unspecified Site  (ICD-789.00) 31)  Urinary Hesitancy  (ICD-788.64) 32)  Low Back Pain  (ICD-724.2) 33)  Depression  (ICD-311) 34)  Benign Prostatic Hypertrophy  (ICD-600.00) 35)  Back Pain, Lumbar, Chronic  (ICD-724.2) 36)  Spinal Stenosis  (ICD-724.00) 37)  Herniated Disc  (ICD-722.2) 38)  Diabetes Mellitus  (ICD-250.00) 39)  Colonic Polyps  (ICD-211.3) 40)  Diverticulosis, Colon  (ICD-562.10) 41)  Esophageal Stricture  (ICD-530.3)  Medications Prior to Update: 1)  Tizanidine Hcl 4 Mg Tabs (Tizanidine Hcl) .Marland Kitchen.. 1-2 By Mouth  Q 6 Hrs As Needed 2)  Glucotrol Xl 10 Mg Tb24 (Glipizide) .... Take 1 Tablet By Mouth Twice A Day 3)  Lisinopril 20 Mg Tabs (Lisinopril) .... Take 1 Tablet By Mouth Once A Day 4)  Symbicort 160-4.5 Mcg/act Aero (Budesonide-Formoterol Fumarate) .... Inhale 2 Puff Using Inhaler Twice A Day 5)  Alprazolam 0.25 Mg  Tabs (Alprazolam) .Marland Kitchen.. 1 By Mouth Three Times A Day As Needed 6)  Pravastatin Sodium 80 Mg Tabs (Pravastatin Sodium) .Marland Kitchen.. 1 By Mouth Once Daily 7)  Multivitamins   Tabs (Multiple Vitamin) .Marland Kitchen.. 1 By Mouth Qd 8)  Proair Hfa 108  (90 Base) Mcg/act  Aers (Albuterol Sulfate) .... Use As Directed 2 Puffs Qid Prn 9)  Oxycontin 80 Mg Xr12h-Tab (Oxycodone Hcl) .... 3 Tabs Qam..3 Tbas At Lunch..2 Tabs At Bedtime 10)  Omeprazole 20 Mg  Cpdr (Omeprazole) .... 2 By Mouth Qd 11)  Citalopram Hydrobromide 40 Mg  Tabs (Citalopram Hydrobromide) .Marland Kitchen.. 1 By Mouth Once Daily 12)  Adult Aspirin Low Strength 81 Mg  Tbdp (Aspirin) .Marland Kitchen.. 1 By Mouth Qd 13)  Metformin Hcl 500 Mg  Tabs (Metformin Hcl) .... 2 By Mouth Qam and 1 By Mouth Q Pm 14)  Oxygen 2l At Night 15)  Hydrocodone-Acetaminophen 10-650 Mg Tabs (Hydrocodone-Acetaminophen) .Marland Kitchen.. 1 Tab Four Times Daily 16)  Nitrostat 0.4 Mg Subl (Nitroglycerin) .... Use As Directed As Needed 17)  Zolpidem Tartrate 10 Mg Tabs (Zolpidem Tartrate) .Marland Kitchen.. 1po At Bedtime As Needed 18)  Ventolin Hfa 108 (90 Base) Mcg/act Aers (Albuterol Sulfate) .... Prn 19)  Ferrous Sulfate 325 (65 Fe) Mg  Tabs (Ferrous Sulfate) .... One Tablet By Mouth Once Daily  Current Medications (verified): 1)  Tizanidine Hcl 4 Mg Tabs (Tizanidine Hcl) .Marland Kitchen.. 1-2 By Mouth Q 6 Hrs As Needed 2)  Glucotrol Xl 10 Mg Tb24 (Glipizide) .... Take 1 Tablet By Mouth Twice A Day 3)  Lisinopril 20 Mg Tabs (Lisinopril) .... Take 1 Tablet By Mouth Once A Day 4)  Symbicort 160-4.5 Mcg/act Aero (Budesonide-Formoterol Fumarate) .... Inhale 2 Puff Using Inhaler Twice A Day 5)  Alprazolam 0.25 Mg  Tabs (Alprazolam) .Marland Kitchen.. 1 By Mouth Three Times A Day As Needed 6)  Pravastatin Sodium 80 Mg Tabs (Pravastatin Sodium) .Marland Kitchen.. 1 By Mouth Once Daily 7)  Multivitamins   Tabs (Multiple Vitamin) .Marland Kitchen.. 1 By Mouth Qd 8)  Proair Hfa 108 (90 Base) Mcg/act  Aers (Albuterol Sulfate) .... Use As Directed 2 Puffs Qid Prn 9)  Oxycontin 80 Mg Xr12h-Tab (Oxycodone Hcl) .... 3 Tabs Qam..3 Tbas At Lunch..2 Tabs At Bedtime 10)  Omeprazole 20 Mg  Cpdr (Omeprazole) .... 2 By Mouth Qd 11)  Citalopram Hydrobromide 40 Mg  Tabs (Citalopram Hydrobromide) .Marland Kitchen.. 1 By Mouth Once Daily 12)   Adult Aspirin Low Strength 81 Mg  Tbdp (Aspirin) .Marland Kitchen.. 1 By Mouth Qd 13)  Metformin Hcl 500 Mg  Tabs (Metformin Hcl) .... 2 By Mouth Qam and 1 By Mouth Q Pm 14)  Oxygen 2l At Night 15)  Hydrocodone-Acetaminophen 10-650 Mg Tabs (Hydrocodone-Acetaminophen) .Marland Kitchen.. 1 Tab Four Times Daily 16)  Nitrostat 0.4 Mg Subl (Nitroglycerin) .... Use As Directed As Needed 17)  Zolpidem Tartrate 10 Mg Tabs (Zolpidem Tartrate) .Marland Kitchen.. 1po At Bedtime As Needed 18)  Ventolin Hfa 108 (90 Base) Mcg/act Aers (Albuterol Sulfate) .... 2 Puffs Four Times Per Day As Needed For Shortness of Breath/wheezing 19)  Ferrous Sulfate 325 (65 Fe) Mg  Tabs (Ferrous Sulfate) .... One Tablet By Mouth Once Daily 20)  Levofloxacin 250 Mg Tabs (Levofloxacin) .Marland Kitchen.. 1po Once Daily 21)  Prednisone 10 Mg Tabs (Prednisone) .... 4po Qd For 3days, Then 3po Qd For 3days, Then 2po Qd For 3days, Then 1po Qd For 3 Days, Then Stop 22)  Tessalon Perles 100 Mg Caps (Benzonatate) .Marland Kitchen.. 1-2 By Mouth Three Times A Day As Needed Cough 23)  Gabapentin 300 Mg Caps (Gabapentin) .Marland Kitchen.. 1-2 By Mouth At Bedtime  Allergies (verified): 1)  ! Nubain 2)  ! Morphine 3)  ! Prednisone 4)  ! * Effexor Xr  Past History:  Past Medical History: Last updated: 01/16/2010 TOBACCO USER (ICD-305.1) WEIGHT LOSS (ICD-783.21) CAD (ICD-414.00) ANGINA PECTORIS (ICD-413.9) PERIPHERAL VASCULAR DISEASE (ICD-443.9) HYPERTENSION (ICD-401.9) HYPERLIPIDEMIA (ICD-272.4) GERD (ICD-530.81) ANXIETY (ICD-300.00) CONTUSION, LEFT CHEST WALL (ICD-922.1) DYSPHAGIA (ICD-787.29) ABDOMINAL PAIN -GENERALIZED (ICD-789.07) OSTEOARTHRITIS (ICD-715.90) ASTHMA (ICD-493.90) ANEMIA-IRON DEFICIENCY (ICD-280.9) MEMORY LOSS (ICD-780.93) DIABETES MELLITUS, TYPE II (ICD-250.00) INSOMNIA-SLEEP DISORDER-UNSPEC (ICD-780.52) DEGENERATIVE DISC DISEASE, LUMBAR SPINE (ICD-722.52) SPECIAL SCREENING MALIG NEOPLASMS OTHER SITES (ICD-V76.49) CHRONIC OBSTRUCTIVE PULMONARY DISEASE, ACUTE EXACERBATION  (ICD-491.21) EMPHYSEMA (ICD-492.8) OBSTRUCTIVE SLEEP APNEA (ICD-327.23) HYPERSOMNIA (ICD-780.54) FATIGUE (ICD-780.79) ABDOMINAL PAIN, UNSPECIFIED SITE (ICD-789.00) URINARY HESITANCY (ICD-788.64) LOW BACK PAIN (ICD-724.2) DEPRESSION (ICD-311) BENIGN PROSTATIC HYPERTROPHY (ICD-600.00) BACK PAIN, LUMBAR, CHRONIC (ICD-724.2) SPINAL STENOSIS (ICD-724.00) HERNIATED DISC (ICD-722.2) DIABETES MELLITUS (ICD-250.00) COLONIC POLYPS (ICD-211.3) DIVERTICULOSIS, COLON (ICD-562.10) ESOPHAGEAL STRICTURE (ICD-530.3) MD roster:  [facility] clinic - [facility] - Dr Wilburt Finlay                   urology - Dr Terance Hart                    Card - Dr [name]                   GI - Dr Henrene Pastor                   optho - Dr Governor Rooks - Dr Altamese Cabal  Past Surgical History: Last updated: 07/26/2008 Coronary artery bypass graft - 2V s/p  bilat knee replacements Hemorrhoidectomy Carpal tunnel release - bilat PTCA/stent - 1998 and 1999 s/p right shoulder arthroscopic surgury 11/05  Social History: Last updated: 06/24/2008 Married Current Smoker 1ppd 30year hx Alcohol use-no 5 children retired - Architect Drug use-no  Risk Factors: Smoking Status: current (06/24/2007) Packs/Day: 1ppd (07/03/2007)  Review of Systems       all otherwise negative per pt -    Physical Exam  General:  alert and overweight-appearing, mild ill  Head:  normocephalic and atraumatic.   Eyes:  vision grossly intact, pupils equal, and pupils round.   Ears:  bilat tm's midl red, sinus mild tender , canals clear Nose:  nasal dischargemucosal pallor and mucosal edema.   Mouth:  pharyngeal erythema and fair dentition.   Neck:  supple and cervical lymphadenopathy.   Lungs:  normal respiratory effort, R decreased breath sounds, R wheezes, L decreased breath sounds, and L wheezes.   Heart:  normal rate and regular rhythm.   Msk:  no joint tenderness and no joint swelling.  , spine nontender throughout Extremities:   no edema, no erythema  Neurologic:  cranial nerves II-XII intact, strength normal in all extremities, and gait normal.  Skin:  color normal and no rashes.   Psych:  not depressed appearing and moderately anxious.     Impression & Recommendations:  Problem # 1:  BRONCHITIS-ACUTE (ICD-466.0)  His updated medication list for this problem includes:    Symbicort 160-4.5 Mcg/act Aero (Budesonide-formoterol fumarate) ..... Inhale 2 puff using inhaler twice a day    Proair Hfa 108 (90 Base) Mcg/act Aers (Albuterol sulfate) ..... Use as directed 2 puffs qid prn    Ventolin Hfa 108 (90 Base) Mcg/act Aers (Albuterol sulfate) .Marland Kitchen... 2 puffs four times per day as needed for shortness of breath/wheezing    Levofloxacin 250 Mg Tabs (Levofloxacin) .Marland Kitchen... 1po once daily    Tessalon Perles 100 Mg Caps (Benzonatate) .Marland Kitchen... 1-2 by mouth three times a day as needed cough treat as above, f/u any worsening signs or symptoms   Orders: T-2 View CXR, Same Day ([id number])  Take antibiotics and other medications as directed. Encouraged to push clear liquids, get enough rest, and take acetaminophen as needed. To be seen in 5-7 days if no improvement, sooner if worse.  Problem # 2:  CHRONIC OBSTRUCTIVE PULMONARY DISEASE, ACUTE EXACERBATION (ICD-491.21) mild to mod, likley related to above,  for depomedrol IM today, and predpack for home Orders: T-2 View CXR, Same Day (71020.5TC) Depo- Medrol 40mg  (J1030) Depo- Medrol 80mg  (J1040) Admin of Therapeutic Inj  intramuscular or subcutaneous [id number])  Problem # 3:  SCIATICA, LEFT (ICD-724.3)  left sided, at night only - vs PHN with hx suggestive of rash/zoster  Problem # 4:  DIABETES MELLITUS, TYPE II (ICD-250.00)  His updated medication list for this problem includes:    Glucotrol Xl 10 Mg Tb24 (Glipizide) .Marland Kitchen... Take 1 tablet by mouth twice a day    Lisinopril 20 Mg Tabs (Lisinopril) .Marland Kitchen... Take 1 tablet by mouth once a day    Adult Aspirin Low Strength 81 Mg Tbdp  (Aspirin) .Marland Kitchen... 1 by mouth qd    Metformin Hcl 500 Mg Tabs (Metformin hcl) .Marland Kitchen... 2 by mouth qam and 1 by mouth q pm  Orders: TLB-BMP (Basic Metabolic Panel-BMET) (99991111) TLB-A1C / Hgb A1C (Glycohemoglobin) (83036-A1C) TLB-Lipid Panel (80061-LIPID)  Labs Reviewed: Creat: 0.8 (01/16/2010)    Reviewed HgBA1c results: 6.5 (01/16/2010)  6.9 (01/10/2009) stable overall by hx and exam, ok to continue meds/tx as is , Pt to cont DM diet, excercise, wt control efforts; to check labs next visit, pt to call for cbg > 200  Complete Medication List: 1)  Tizanidine Hcl 4 Mg Tabs (Tizanidine hcl) .Marland Kitchen.. 1-2 by mouth q 6 hrs as needed 2)  Glucotrol Xl 10 Mg Tb24 (Glipizide) .... Take 1 tablet by mouth twice a day 3)  Lisinopril 20 Mg Tabs (Lisinopril) .... Take 1 tablet by mouth once a day 4)  Symbicort 160-4.5 Mcg/act Aero (Budesonide-formoterol fumarate) .... Inhale 2 puff using inhaler twice a day 5)  Alprazolam 0.25 Mg Tabs (Alprazolam) .Marland Kitchen.. 1 by mouth three times a day as needed 6)  Pravastatin Sodium 80 Mg Tabs (Pravastatin sodium) .Marland Kitchen.. 1 by mouth once daily 7)  Multivitamins Tabs (Multiple vitamin) .Marland Kitchen.. 1 by mouth qd 8)  Proair Hfa 108 (90 Base) Mcg/act Aers (Albuterol sulfate) .... Use as directed 2 puffs qid prn 9)  Oxycontin 80 Mg Xr12h-tab (Oxycodone hcl) .... 3 tabs qam..3 tbas at lunch..2 tabs at bedtime 10)  Omeprazole 20 Mg Cpdr (Omeprazole) .... 2 by mouth qd 11)  Citalopram Hydrobromide 40 Mg Tabs (Citalopram hydrobromide) .Marland Kitchen.. 1 by mouth once daily 12)  Adult Aspirin Low Strength 81 Mg Tbdp (Aspirin) .Marland Kitchen.. 1 by mouth qd 13)  Metformin Hcl 500 Mg Tabs (Metformin hcl) .... 2 by mouth qam and 1 by mouth q  pm 14)  Oxygen 2l At Night  15)  Hydrocodone-acetaminophen 10-650 Mg Tabs (Hydrocodone-acetaminophen) .Marland Kitchen.. 1 tab four times daily 16)  Nitrostat 0.4 Mg Subl (Nitroglycerin) .... Use as directed as needed 17)  Zolpidem Tartrate 10 Mg Tabs (Zolpidem tartrate) .Marland Kitchen.. 1po at bedtime as  needed 18)  Ventolin Hfa 108 (90 Base) Mcg/act Aers (Albuterol sulfate) .... 2 puffs four times per day as needed for shortness of breath/wheezing 19)  Ferrous Sulfate 325 (65 Fe) Mg Tabs (Ferrous sulfate) .... One tablet by mouth once daily 20)  Levofloxacin 250 Mg Tabs (Levofloxacin) .Marland Kitchen.. 1po once daily 21)  Prednisone 10 Mg Tabs (Prednisone) .... 4po qd for 3days, then 3po qd for 3days, then 2po qd for 3days, then 1po qd for 3 days, then stop 22)  Tessalon Perles 100 Mg Caps (Benzonatate) .Marland Kitchen.. 1-2 by mouth three times a day as needed cough 23)  Gabapentin 300 Mg Caps (Gabapentin) .Marland Kitchen.. 1-2 by mouth at bedtime  Patient Instructions: 1)  You had the steroid shot today 2)  Please take all new medications as prescribed 3)  Continue all previous medications as before this visit  4)  Please go to the Lab in the basement for your blood and/or urine tests today  5)  Please go to Radiology in the basement level for your X-Ray today  6)  Please call the number on the [place] for results of your testing  7)  Please schedule a follow-up appointment in October 2012 for CPX with labs and: 8)  HbgA1C prior to visit, ICD-9: 250.02 9)  Urine Microalbumin prior to visit, ICD-9: Prescriptions: GABAPENTIN 300 MG CAPS (GABAPENTIN) 1-2 by mouth at bedtime  #60 x 5   Entered and Authorized by:   Biagio Borg MD   Signed by:   Biagio Borg MD on 05/31/2010   Method used:   Print then Give to Patient   RxID:   [id number] TESSALON PERLES 100 MG CAPS (BENZONATATE) 1-2 by mouth three times a day as needed cough  #60 x 1   Entered and Authorized by:   Biagio Borg MD   Signed by:   Biagio Borg MD on 05/31/2010   Method used:   Print then Give to Patient   RxID:   [id number] PREDNISONE 10 MG TABS (PREDNISONE) 4po qd for 3days, then 3po qd for 3days, then 2po qd for 3days, then 1po qd for 3 days, then stop  #30 x 0   Entered and Authorized by:   Biagio Borg MD   Signed by:   Biagio Borg MD on  05/31/2010   Method used:   Print then Give to Patient   RxID:   [id number] LEVOFLOXACIN 250 MG TABS (LEVOFLOXACIN) 1po once daily  #10 x 0   Entered and Authorized by:   Biagio Borg MD   Signed by:   Biagio Borg MD on 05/31/2010   Method used:   Print then Give to Patient   RxID:   [id number] VENTOLIN HFA 108 (90 BASE) MCG/ACT AERS (ALBUTEROL SULFATE) 2 puffs four times per day as needed for shortness of breath/wheezing  #1 x 11   Entered and Authorized by:   Biagio Borg MD   Signed by:   Biagio Borg MD on 05/31/2010   Method used:   Print then Give to Patient   RxID:   [id number] SYMBICORT 160-4.5 MCG/ACT AERO (BUDESONIDE-FORMOTEROL FUMARATE) Inhale 2 puff using inhaler twice a day  #  1 x 11   Entered and Authorized by:   Biagio Borg MD   Signed by:   Biagio Borg MD on 05/31/2010   Method used:   Print then Give to Patient   RxID:   [id number]    Medication Administration  Injection # 1:    Medication: Depo- Medrol 40mg     Diagnosis: CHRONIC OBSTRUCTIVE PULMONARY DISEASE, ACUTE EXACERBATION (ICD-491.21)    Route: IM    Site: LUOQ gluteus    Exp Date: 07/2012    Lot #: [id number]    Mfr: [employer]    Comments: Patient received 120mg  Depo-Medrol    Patient tolerated injection without complications    Given by: Shirlean Mylar Ewing CMA Deborra Medina) (May 31, 2010 3:50 PM)  Injection # 2:    Medication: Depo- Medrol 80mg     Diagnosis: CHRONIC OBSTRUCTIVE PULMONARY DISEASE, ACUTE EXACERBATION (ICD-491.21)    Route: IM    Site: LUOQ gluteus    Exp Date: 07/2012    Lot #: [id number]    Mfr: [employer]    Given by: Shirlean Mylar Ewing CMA Deborra Medina) (May 31, 2010 3:50 PM)  Orders Added: 1)  T-2 View CXR, Same Day [71020.5TC] 2)  TLB-BMP (Basic Metabolic Panel-BMET) 123456 3)  TLB-A1C / Hgb A1C (Glycohemoglobin) [83036-A1C] 4)  TLB-Lipid Panel [80061-LIPID] 5)  Depo- Medrol 40mg  [J1030] 6)  Depo- Medrol 80mg  [J1040] 7)  Admin of Therapeutic Inj  intramuscular or  subcutaneous [96372] 8)  Est. Patient Level IV [id number]

## 2010-06-12 ENCOUNTER — Ambulatory Visit (INDEPENDENT_AMBULATORY_CARE_PROVIDER_SITE_OTHER): Payer: Medicare Other | Admitting: Internal Medicine

## 2010-06-12 ENCOUNTER — Other Ambulatory Visit: Payer: Self-pay | Admitting: Internal Medicine

## 2010-06-12 ENCOUNTER — Encounter: Payer: Self-pay | Admitting: Internal Medicine

## 2010-06-12 DIAGNOSIS — R1013 Epigastric pain: Secondary | ICD-10-CM

## 2010-06-12 DIAGNOSIS — D500 Iron deficiency anemia secondary to blood loss (chronic): Secondary | ICD-10-CM

## 2010-06-12 DIAGNOSIS — R142 Eructation: Secondary | ICD-10-CM | POA: Insufficient documentation

## 2010-06-12 DIAGNOSIS — R141 Gas pain: Secondary | ICD-10-CM | POA: Insufficient documentation

## 2010-06-12 DIAGNOSIS — R143 Flatulence: Secondary | ICD-10-CM

## 2010-06-12 DIAGNOSIS — R1310 Dysphagia, unspecified: Secondary | ICD-10-CM

## 2010-06-12 DIAGNOSIS — K219 Gastro-esophageal reflux disease without esophagitis: Secondary | ICD-10-CM

## 2010-06-12 DIAGNOSIS — K5900 Constipation, unspecified: Secondary | ICD-10-CM | POA: Insufficient documentation

## 2010-06-19 ENCOUNTER — Other Ambulatory Visit (HOSPITAL_COMMUNITY): Payer: Medicare Other

## 2010-06-19 NOTE — Progress Notes (Signed)
Summary: [facility]  [place]   Imported By: Phillis Knack 06/13/2010 08:24:01  _____________________________________________________________________  External Attachment:    Type:   Image     Comment:   External Document

## 2010-06-19 NOTE — Procedures (Signed)
Summary: [facility] [place]   Imported By: Phillis Knack 06/13/2010 07:36:24  _____________________________________________________________________  External Attachment:    Type:   Image     Comment:   External Document

## 2010-06-19 NOTE — Letter (Signed)
Summary: Diabetic Instructions  [facility]  [place], [place] [address]   Phone: [phone]  Fax: [phone]    [name] Jul 03, 1940 MRN: [id number]   X_   ORAL DIABETIC MEDICATION INSTRUCTIONS  The day before your procedure:   Take your diabetic pill as you do normally  The day of your procedure:   Do not take your diabetic pill    We will check your blood sugar levels during the admission process and again in Recovery before discharging you home  ________________________________________________________________________

## 2010-06-19 NOTE — Procedures (Signed)
Summary: EGD/[facility]  EGD/[facility]   Imported By: Phillis Knack 06/13/2010 07:34:52  _____________________________________________________________________  External Attachment:    Type:   Image     Comment:   External Document

## 2010-06-19 NOTE — Progress Notes (Signed)
Summary: [place]  [place]   Imported By: Phillis Knack 06/13/2010 07:55:42  _____________________________________________________________________  External Attachment:    Type:   Image     Comment:   External Document

## 2010-06-19 NOTE — Procedures (Signed)
Summary: EGD/[place]  EGD/[place]   Imported By: Phillis Knack 06/13/2010 08:18:04  _____________________________________________________________________  External Attachment:    Type:   Image     Comment:   External Document

## 2010-06-19 NOTE — Procedures (Signed)
Summary: EGD/[place]  EGD/[place]   Imported By: Phillis Knack 06/13/2010 08:20:01  _____________________________________________________________________  External Attachment:    Type:   Image     Comment:   External Document

## 2010-06-19 NOTE — Procedures (Signed)
Summary: EGD/[facility]  EGD/[facility]   Imported By: Phillis Knack 06/13/2010 07:38:05  _____________________________________________________________________  External Attachment:    Type:   Image     Comment:   External Document

## 2010-06-19 NOTE — Procedures (Signed)
Summary: Colonoscopy/[place]  Colonoscopy/[place]   Imported By: Phillis Knack 06/13/2010 07:44:39  _____________________________________________________________________  External Attachment:    Type:   Image     Comment:   External Document

## 2010-06-19 NOTE — Consult Note (Signed)
Summary: [place]  [place]   Imported By: Phillis Knack 06/13/2010 08:25:15  _____________________________________________________________________  External Attachment:    Type:   Image     Comment:   External Document

## 2010-06-19 NOTE — Assessment & Plan Note (Signed)
Summary: GASTRIC PROBLEMS  ( scheduled with wife)   History of Present Illness Visit Type: follow up Primary GI MD: Scarlette Shorts MD Primary Provider: Cathlean Cower, MD Requesting Provider: n/a Chief Complaint: Patient c/o 3 months worsening bloating and lower abdominal pain. He also has difficulty with constipation. There is no rectal bleeding or rectal pain. Patient also notes several months occasional dysphagia to solids.  History of Present Illness:    70 year old with multiple significant medical problems including coronary artery disease, chronic obstructive pulmonary disease, ongoing tobacco abuse, hypertension, diabetes mellitus, arthritis,  and GERD complicated by peptic stricture. Also a history of iron deficiency anemia secondary to [facility] erosions on large hiatal hernia. Presents today with his wife with the chief complaint of abdominal bloating discomfort and constipation. Also , some recurrent dysphagia to solids. He was seen in April of  2010 regarding abdominal complaints. EGD at that time revealing hiatal hernia, Cameron erosions, and a stricture which was balloon dilated. Followup CT scan May 2010 unremarkable. His last colonoscopy in November of 2008 was negative except for diverticulosis. Current symptoms have bothered him off and on since his last visit. Worse over the past 4-5 months. He describes rare episodes of severe epigastric pain which seems somewhat different and self-limited.. This was not associated with meals. No exacerbating or relieving factors other than time...   GI Review of Systems    Reports abdominal pain, acid reflux, bloating, chest pain, and  dysphagia with solids.     Location of  Abdominal pain: lower abdomen.    Denies belching, dysphagia with liquids, heartburn, loss of appetite, nausea, vomiting, vomiting blood, weight loss, and  weight gain.      Reports constipation.     Denies anal fissure, black tarry stools, change in bowel habit, diarrhea,  diverticulosis, fecal incontinence, heme positive stool, hemorrhoids, irritable bowel syndrome, jaundice, light color stool, liver problems, rectal bleeding, and  rectal pain. Preventive Screening-Counseling & Management  Caffeine-Diet-Exercise     Does Patient Exercise: no    Current Medications (verified): 1)  Tizanidine Hcl 4 Mg Tabs (Tizanidine Hcl) .Marland Kitchen.. 1-2 By Mouth Q 6 Hrs As Needed 2)  Glucotrol Xl 10 Mg Tb24 (Glipizide) .... Take 1 Tablet By Mouth Twice A Day 3)  Lisinopril 20 Mg Tabs (Lisinopril) .... Take 1 Tablet By Mouth Once A Day 4)  Symbicort 160-4.5 Mcg/act Aero (Budesonide-Formoterol Fumarate) .... Inhale 2 Puff Using Inhaler Twice A Day 5)  Alprazolam 0.25 Mg  Tabs (Alprazolam) .Marland Kitchen.. 1 By Mouth Three Times A Day As Needed 6)  Pravastatin Sodium 80 Mg Tabs (Pravastatin Sodium) .Marland Kitchen.. 1 By Mouth Once Daily 7)  Multivitamins   Tabs (Multiple Vitamin) .Marland Kitchen.. 1 By Mouth Qd 8)  Proair Hfa 108 (90 Base) Mcg/act  Aers (Albuterol Sulfate) .... Use As Directed 2 Puffs Qid Prn 9)  Oxycontin 80 Mg Xr12h-Tab (Oxycodone Hcl) .... 3 Tabs Qam..3 Tbas At Lunch..2 Tabs At Bedtime 10)  Omeprazole 20 Mg  Cpdr (Omeprazole) .... 2 By Mouth Qd 11)  Citalopram Hydrobromide 40 Mg  Tabs (Citalopram Hydrobromide) .Marland Kitchen.. 1 By Mouth Once Daily 12)  Adult Aspirin Low Strength 81 Mg  Tbdp (Aspirin) .Marland Kitchen.. 1 By Mouth Qd 13)  Metformin Hcl 500 Mg  Tabs (Metformin Hcl) .... 2 By Mouth Qam and 1 By Mouth Q Pm 14)  Oxygen 2l At Night 15)  Hydrocodone-Acetaminophen 10-650 Mg Tabs (Hydrocodone-Acetaminophen) .Marland Kitchen.. 1 Tab Four Times Daily 16)  Nitrostat 0.4 Mg Subl (Nitroglycerin) .... Use As Directed As  Needed 17)  Zolpidem Tartrate 10 Mg Tabs (Zolpidem Tartrate) .Marland Kitchen.. 1po At Bedtime As Needed 18)  Ventolin Hfa 108 (90 Base) Mcg/act Aers (Albuterol Sulfate) .... 2 Puffs Four Times Per Day As Needed For Shortness of Breath/wheezing 19)  Ferrous Sulfate 325 (65 Fe) Mg  Tabs (Ferrous Sulfate) .... One Tablet By Mouth Once  Daily 20)  Gabapentin 300 Mg Caps (Gabapentin) .Marland Kitchen.. 1-2 By Mouth At Bedtime  Past History:  Past Medical History: Reviewed history from 01/16/2010 and no changes required. TOBACCO USER (ICD-305.1) WEIGHT LOSS (ICD-783.21) CAD (ICD-414.00) ANGINA PECTORIS (ICD-413.9) PERIPHERAL VASCULAR DISEASE (ICD-443.9) HYPERTENSION (ICD-401.9) HYPERLIPIDEMIA (ICD-272.4) GERD (ICD-530.81) ANXIETY (ICD-300.00) CONTUSION, LEFT CHEST WALL (ICD-922.1) DYSPHAGIA (ICD-787.29) ABDOMINAL PAIN -GENERALIZED (ICD-789.07) OSTEOARTHRITIS (ICD-715.90) ASTHMA (ICD-493.90) ANEMIA-IRON DEFICIENCY (ICD-280.9) MEMORY LOSS (ICD-780.93) DIABETES MELLITUS, TYPE II (ICD-250.00) INSOMNIA-SLEEP DISORDER-UNSPEC (ICD-780.52) DEGENERATIVE DISC DISEASE, LUMBAR SPINE (ICD-722.52) SPECIAL SCREENING MALIG NEOPLASMS OTHER SITES (ICD-V76.49) CHRONIC OBSTRUCTIVE PULMONARY DISEASE, ACUTE EXACERBATION (ICD-491.21) EMPHYSEMA (ICD-492.8) OBSTRUCTIVE SLEEP APNEA (ICD-327.23) HYPERSOMNIA (ICD-780.54) FATIGUE (ICD-780.79) ABDOMINAL PAIN, UNSPECIFIED SITE (ICD-789.00) URINARY HESITANCY (ICD-788.64) LOW BACK PAIN (ICD-724.2) DEPRESSION (ICD-311) BENIGN PROSTATIC HYPERTROPHY (ICD-600.00) BACK PAIN, LUMBAR, CHRONIC (ICD-724.2) SPINAL STENOSIS (ICD-724.00) HERNIATED DISC (ICD-722.2) DIABETES MELLITUS (ICD-250.00) COLONIC POLYPS (ICD-211.3) DIVERTICULOSIS, COLON (ICD-562.10) ESOPHAGEAL STRICTURE (ICD-530.3) MD roster:  Pain clinic - [facility] - Dr Wilburt Finlay                   urology - Dr Terance Hart                    Card - Dr [name]                   GI - Dr Henrene Pastor                   optho - Dr Governor Rooks - Dr Altamese Cabal  Past Surgical History: Coronary artery bypass graft - 2V s/p  bilat knee replacements Hemorrhoidectomy Carpal tunnel release - bilat PTCA/stent - 1998 and 1999 s/p right shoulder arthroscopic surgery 11/05  Family History: heart disease stroke - father at 53 yo HTN Family History High  cholesterol alzheimers-mother daughter with renal cancer emphysema: father allergies: mother, father, siblings, children astha: father No FH of Colon Cancer: Family History of Diabetes: Mother, Father, Brothers, Sisters, Aunts, Uncles Family History of Heart Disease: Father, Brother  Social History: Married Current Smoker 1ppd 30year hx Alcohol use-no 5 children retired Animal nutritionist Drug use-no Daily Caffeine Use-4-5 cups daily Patient does not get regular exercise.  Does Patient Exercise:  no  Review of Systems       The patient complains of allergy/sinus, arthritis/joint pain, back pain, change in vision, confusion, cough, fatigue, headaches-new, hearing problems, heart rhythm changes, muscle pains/cramps, night sweats, shortness of breath, sleeping problems, and swelling of feet/legs.  The patient denies anemia, anxiety-new, blood in urine, coughing up blood, depression-new, fainting, fever, heart murmur, itching, menstrual pain, nosebleeds, pregnancy symptoms, skin rash, sore throat, swollen lymph glands, thirst - excessive , urination - excessive , urination changes/pain, urine leakage, vision changes, and voice change.    Vital Signs:  Patient profile:   70 year old male Height:      68.5 inches Weight:      198.25 pounds BMI:     29.81 BSA:     2.05 Pulse rate:   60 / minute Pulse rhythm:   regular BP sitting:  124 / 27  (left arm)  Vitals Entered By: Madlyn Frankel CMA Deborra Medina) (June 12, 2010 2:39 PM)  Physical Exam  General:  Well developed, well nourished, no acute distress. Head:  Normocephalic and atraumatic. Eyes:  PERRLA, no icterus. Mouth:  No deformity or lesions Neck:  Supple; no masses or thyromegaly. Lungs:  Clear throughout to auscultation. Heart:  Regular rate and rhythm; no murmurs, rubs,  or bruits. Abdomen:  Soft, obese, nontender and nondistended. No masses, hepatosplenomegaly or hernias noted. Normal bowel sounds. Msk:  Symmetrical with  no gross deformities. Normal posture. Pulses:  Normal pulses noted. Extremities:  No clubbing, cyanosis, edema or deformities noted. Neurologic:  Alert and  oriented x4 Skin:  Intact without significant lesions or rashes. Psych:  Alert and cooperative. Normal mood and affect.   Impression & Recommendations:  Problem # 1:  ABDOMINAL PAIN, EPIGASTRIC (ICD-789.06)  abdominal discomfort. Generally consists of bloating related to constipation. However, a recent episode of acute epigastric discomfort.  Plan : #1. abdominal ultrasound   #2. continue PPI  Problem # 2:  DYSPHAGIA [id number])  recurrent dysphagia likely secondary to peptic stricture.  Plan  #1. EGD with dilation. The nature of the procedure as well as risks, benefits, and alternatives were reviewed. He understood and agreed to proceed  Problem # 3:  ESOPHAGEAL STRICTURE (ICD-530.3) Assessment: Deteriorated  Problem # 4:  GERD (ICD-530.81)  continue reflux precautions and PPI  Problem # 5:  CONSTIPATION (ICD-564.00)  Maalox when necessary  Problem # 6:  FLATULENCE-GAS-BLOATING ([id number])  treat constipation stop smoking  Other Orders: Ultrasound Abdomen (UAS)  Patient Instructions: 1)  Abdominal Ultrasound [facility] 06/19/10 8:00 am Nothing to eat or drink after midnight 2)  EGD [facility] 07/11/10 1:30 pm arrive at 12:30 pm on 4th floor. 3)  Upper Endoscopy brochure given.  4)  Hold Diabetic meds morning of procedure. 5)  Dexilant samples given to patient 6)  The medication list was reviewed and reconciled.  All changed / newly prescribed medications were explained.  A complete medication list was provided to the patient / caregiver. 7)  Copy sent to : Cathlean Cower, MD

## 2010-06-19 NOTE — Progress Notes (Signed)
Summary: [place]  [place]   Imported By: Phillis Knack 06/13/2010 08:22:28  _____________________________________________________________________  External Attachment:    Type:   Image     Comment:   External Document

## 2010-06-19 NOTE — Letter (Signed)
Summary: EGD Instructions  [facility]  [place], [place] [address]   Phone: [phone]  Fax: [phone]       [name]    12/26/1940    MRN: [id number]       Procedure Day /Date:WEDNESDAY 07/11/10     Arrival Time: 12:30 PM     Procedure Time:1:30 PM     Location of Procedure:                    [facility] [place] (4th Floor)   PREPARATION FOR ENDOSCOPY   On 07/11/10 THE DAY OF THE PROCEDURE:  1.   No solid foods, milk or milk products are allowed after midnight the night before your procedure.  2.   Do not drink anything colored red or purple.  Avoid juices with pulp.  No orange juice.  3.  You may drink clear liquids until11:30 AM, which is 2 hours before your procedure.                                                                                                CLEAR LIQUIDS INCLUDE: Water Jello Ice Popsicles Tea (sugar ok, no milk/cream) Powdered fruit flavored drinks Coffee (sugar ok, no milk/cream) Gatorade Juice: apple, white grape, white cranberry  Lemonade Clear bullion, consomm, broth Carbonated beverages (any kind) Strained chicken noodle soup Hard Candy   MEDICATION INSTRUCTIONS  Unless otherwise instructed, you should take regular prescription medications with a small sip of water as early as possible the morning of your procedure.  Diabetic patients - see separate instructions.          OTHER INSTRUCTIONS  You will need a responsible adult at least 68 years of age to accompany you and drive you home.   This person must remain in the waiting room during your procedure.  Wear loose fitting clothing that is easily removed.  Leave jewelry and other valuables at home.  However, you may wish to bring a book to read or an iPod/MP3 player to listen to music as you wait for your procedure to start.  Remove all body piercing jewelry and leave at home.  Total time from sign-in until discharge is  approximately 2-3 hours.  You should go home directly after your procedure and rest.  You can resume normal activities the day after your procedure.  The day of your procedure you should not:   Drive   Make legal decisions   Operate machinery   Drink alcohol   Return to work  You will receive specific instructions about eating, activities and medications before you leave.    The above instructions have been reviewed and explained to me by   _______________________    I fully understand and can verbalize these instructions _____________________________ Date _________

## 2010-06-25 ENCOUNTER — Ambulatory Visit (HOSPITAL_COMMUNITY)
Admission: RE | Admit: 2010-06-25 | Discharge: 2010-06-25 | Disposition: A | Discharge status: Home / Self Care | Payer: Medicare Other | Source: Ambulatory Visit | Attending: Internal Medicine | Admitting: Internal Medicine

## 2010-06-25 DIAGNOSIS — I10 Essential (primary) hypertension: Secondary | ICD-10-CM | POA: Insufficient documentation

## 2010-06-25 DIAGNOSIS — R1013 Epigastric pain: Secondary | ICD-10-CM

## 2010-06-25 DIAGNOSIS — R110 Nausea: Secondary | ICD-10-CM | POA: Insufficient documentation

## 2010-06-25 DIAGNOSIS — E119 Type 2 diabetes mellitus without complications: Secondary | ICD-10-CM | POA: Insufficient documentation

## 2010-06-27 LAB — CBC
HCT: 42.9 % (ref 39.0–52.0)
MCHC: 33.9 g/dL (ref 30.0–36.0)
MCV: 98 fL (ref 78.0–100.0)
Platelets: 229 10*3/uL (ref 150–400)
RBC: 4.38 MIL/uL (ref 4.22–5.81)

## 2010-06-27 LAB — GLUCOSE, CAPILLARY: Glucose-Capillary: 81 mg/dL (ref 70–99)

## 2010-06-27 LAB — COMPREHENSIVE METABOLIC PANEL
AST: 21 U/L (ref 0–37)
BUN: 12 mg/dL (ref 6–23)
CO2: 32 mEq/L (ref 19–32)
Calcium: 9.8 mg/dL (ref 8.4–10.5)
Chloride: 100 mEq/L (ref 96–112)
Creatinine, Ser: 0.8 mg/dL (ref 0.4–1.5)
GFR calc Af Amer: >60 mL/min (ref >60)
GFR calc non Af Amer: >60 mL/min (ref >60)
Total Bilirubin: 0.4 mg/dL (ref 0.3–1.2)

## 2010-07-11 ENCOUNTER — Other Ambulatory Visit: Payer: Medicare Other | Admitting: Internal Medicine

## 2010-07-18 LAB — GLUCOSE, CAPILLARY: Glucose-Capillary: 76 mg/dL (ref 70–99)

## 2010-08-01 ENCOUNTER — Other Ambulatory Visit: Payer: Self-pay | Admitting: Internal Medicine

## 2010-08-01 NOTE — Telephone Encounter (Signed)
Faxed hardcopy to pharmacy. 

## 2010-08-21 NOTE — Assessment & Plan Note (Signed)
[name]                            CARDIOLOGY OFFICE NOTE   NAME:[name], [name]                     MRN:          [id number]  DATE:07/22/2007                            DOB:          09/20/1940    [name] returns today for further management of the following  issues.  1. Coronary artery disease.  2. Severe mixed hyperlipidemia.  3. Hypertension.  4. Heavy tobacco use, now down to he says a half-a-pack to a pack per      day, was up to three packs.  5.  History of heavy alcohol use.  5. Gastroesophageal reflux.  6. Peripheral arterial disease, being treated medically.   He is currently having very little angina.  He is trying to cut back on  his cigs.  He smells very heavily of tobacco today and his fingers are  yellow stained.   Recent blood work in September showed a total cholesterol of 242,  triglycerides 511.  HDL was 26.9.  His direct LDL was 118.  Hemoglobin  A1c was 7%.   ALLERGIES:  HE IS INTOLERANT TO CHANTIX AND CANNOT USE OF PATCHES HIS  WIFE SAYS.   CURRENT MEDICATIONS:  Unchanged since his last visit, except now he is  on 40 mg of citalopram a day.  He is no longer taking Tricor.  He is on  Pravachol 20 mg a day, which he was on before.  He is on metformin 500  mg a day.   He is using nitroglycerin very infrequently.   Details of his coronary anatomy are given in his last office note.   PHYSICAL EXAMINATION:  VITAL SIGNS:  His blood pressure today is 106/62,  pulse 79 and regular.  His weight is 214.  Respiratory rate is about 24.  GENERAL:  He is chronically ill-appearing.  CHEST:  He has expiratory rhonchi and wheezes that are audible without  my scope.  HEENT:  Otherwise unchanged.  NECK:  Carotids upstrokes are equal bilaterally without bruits.  No JVD.  Thyroid is not enlarged.  Trachea is midline.  LUNGS:  Reveal inspiratory/expiratory rhonchi.  HEART:  Reveals a nondisplaced PMI.  He has a barrel chest.   Sternotomy  site is intact.  ABDOMEN:  Soft, good bowel sounds.  No midline bruit.  No hepatomegaly.  EXTREMITIES:  No cyanosis, clubbing or significant edema.  Pulses are  present but reduced.   EKG shows sinus rhythm with a right bundle which is stable.   [name] is stable from our standpoint.  Unfortunately, he continues  to have very poor compliance in general and his risks are extremely high  of having an acute cardiac event.  I explained this to he and his wife  on last visit.  Of course, I do not think he is going to change.  Hopefully, he will continue to do well.  I will see him back in 6  months.     Thomas C. [name], MD, [facility]  Electronically Signed    TCW/MedQ  DD: 07/22/2007  DT: 07/22/2007  Job #: [id number]   cc:  Biagio Borg, MD

## 2010-08-21 NOTE — Assessment & Plan Note (Signed)
[facility]                            CARDIOLOGY OFFICE NOTE   NAME:[name], [name]                     MRN:          [id number]  DATE:01/13/2007                            DOB:          1940/12/09    Mr. Foxwell returns today.   PROBLEM LIST:  1. Coronary disease, status post coronary bypass surgery x2 September 12, 1997.  Last catheterization showed an occluded graft to the right      coronary artery with good collateral flow from the circumflex and      LAD.  He has a patent vein graft to an obtuse marginal.  He had      nonobstructive disease otherwise.  He had inferior wall      hypokinesia, EF of 55%.  He occasionally has some angina and that      is when he takes his isosorbide.  Otherwise, he is not taking his      isosorbide daily!  2. Systemic hypertension with normal renal arteriography in 2005.  3. Mixed hyperlipidemia.  This is being followed by Dr. Jenny Reichmann.  4. Heavy tobacco use, now two packs a day, with COPD.  5. History of heavy alcohol use, no longer a problem.  6. Gastroesophageal reflux disease.  7. Peripheral artery disease with a left superficial femoral artery      occlusion, three-vessel runoff.  This is being treated medically.  8. Bilateral total knee replacements.   He tried the patches for no smoking but he said it made him feel bad.  He is willing to try Chantix.   He saw Dr. Jenny Reichmann recently, who found him to be bradycardic with heart  rates in the high 40s.  He stopped his metoprolol, which was 25 mg a  day.  He is 55 beats per minute today by EKG.  He is on no other rate-  slowing drugs.   CURRENT MEDICATIONS:  1. Prilosec 20 mg over-the-counter b.i.d.  2. Glucotrol 10 mg b.i.d.  3. Soma 350 mg one to two daily.  4. Citalopram 20 mg a day.  5. OxyContin.  6. Alprazolam.  7. Lisinopril 20 mg a day.  8. Vicodin.  9. Tylenol.  10.Aspirin 81 mg a day.  11.Multivitamin daily.  12.Pravachol 20 mg a day.   His  blood pressure today is 130/79, pulse 55 and regular.  EKG is  stable.  Weight is 198.  He is chronically ill.  He looks much older than his stated age.  HEENT:  Normocephalic, atraumatic.  PERRLA, extraocular movements  intact.  Sclerae injected.  Facial symmetry is normal.  Carotid upstrokes are equal bilaterally without bruits.  No JVD.  Thyroid is not enlarged.  Trachea is midline.  LUNGS:  Remarkable for inspiratory-expiratory rhonchi, which are audible  and he is coughing frequently.  ABDOMEN:  Soft, good bowel sounds.  No midline bruit.  No hepatomegaly.  EXTREMITIES:  No cyanosis, clubbing or edema.  Pulses are present.  Varicose veins are present.  No sign of DVT.  NEUROLOGIC:  Grossly intact.   I have had about  a 20+ minute discussion with Mr. Bihl and his wife.  They are concerned that his bypass is only supposed to last 10 years.  He has already lost one.  I told him if he was that concerned, that he  needs to really give a serious thought to stopping smoking.  I have  written for Chantix.  I reinforced him not taking isosorbide at all  since he has not been taking it consistently, using sublingual  nitroglycerin p.r.n.  I have not restarted his metoprolol because of  bradycardia.   Compliance is a major issue with Mr. Fruin, not only in terms of him  just smoking but in terms of taking his medicines.   After a good talk, I hope he will take better care of himself.  I will  see him back in 6 months.     Thomas C. [name], MD, [facility]  Electronically Signed    TCW/MedQ  DD: 01/13/2007  DT: 01/14/2007  Job #: [id number]

## 2010-08-21 NOTE — Assessment & Plan Note (Signed)
[facility]                         GASTROENTEROLOGY OFFICE NOTE   NAME:[name], [name]                     MRN:          [id number]  DATE:01/21/2007                            DOB:          1940-11-11    REFERRING PHYSICIAN:  Biagio Borg, M.D.   REASON FOR CONSULTATION:  Dysphagia, lower abdominal discomfort with  bloating and surveillance colonoscopy.   HISTORY:  This is a 70 year old white male with coronary artery disease,  status post coronary artery bypass graft surgery x2, angina pectoris,  hypertension, hyperlipidemia, COPD secondary to chronic tobacco abuse  which is ongoing, remote history of alcohol abuse, peripheral artery  disease, bilateral total knee replacements, gastroesophageal reflux  disease complicated by peptic stricture for which she has undergone  prior esophageal dilation and a history of colon polyps.  The patient  last underwent complete colonoscopy in May of 2002.  He was found to  have a diminutive rectal polyp which was removed.  Also diverticulosis.  His preparation was poor.  The follow-up in May of 2007 recommended.  He  has received a recall letter.  He last underwent upper endoscopy with  esophageal dilation that same day.  He was dilated to a maximum diameter  of 17 mm.  The patient has chronic reflux for which he takes Prilosec 20  mg daily.  On Prilosec, no heartburn or indigestion.  He has had  worsening intermittent solid food dysphagia over the past year.  In  terms of his lower abdomen, he describes bloating and discomfort in the  left lower quadrant.  This is quite chronic.  He denies nausea,  vomiting, melena, or hematochezia.   PAST MEDICAL HISTORY:  Extensive as above.   PAST SURGICAL HISTORY:  1. Coronary artery bypass grafting.  2. Hemorrhoidectomy.  3. Knee replacements.   ALLERGIES:  NUBAIN, MORPHINE.   CURRENT MEDICATIONS:  1. Prilosec 20 mg daily.  2. Glucotrol XL 10 mg b.i.d.  3. Soma  compound.  4. Citalopram 20 mg daily.  5. OxyContin 80 mg six daily.  6. Xanax 0.25 mg t.i.d.  7. Lisinopril 20 mg daily.  8. Vicodin 7.5/650 four daily.  9. Baby aspirin.  10.Multivitamin.  11.Pravachol 20 mg daily.  12.Isosorbide p.r.n.  13.Sublingual nitroglycerin p.r.n.  14.Albuterol p.r.n.  15.Trazodone p.r.n.  16.Actos.  17.Plavix.  18.TriCor.  19.Effexor, but is not due to cost related issues.   FAMILY HISTORY:  Negative for gastrointestinal malignancy.  Sister with  breast cancer.   SOCIAL HISTORY:  The patient is married with five children.  He is  accompanied by his wife.  He worked previously in [employer].  He  smokes significantly.  He does not use alcohol.   REVIEW OF SYSTEMS:  Per diagnostic evaluation form.   PHYSICAL EXAMINATION:  GENERAL:  Chronically ill-appearing male who  looks older than his stated age.  He is alert and oriented x3 and in no  acute distress.  VITAL SIGNS:  Blood pressure 104/68, heart rate 68, weight is 198  pounds.  He is 5 feet 8 inches in height.  HEENT:  Sclerae anicteric, conjunctivae pink, oral  mucosa is intact.  Tongue is tobacco stained.  LUNGS:  Inspiratory and expiratory wheezes, though he is moving air  well.  HEART:  Regular.  ABDOMEN:  Soft without tenderness, mass, or hernia.  Good bowel sounds  heard.  EXTREMITIES:  Without edema.   IMPRESSION:  1. History of colon polyp, due for surveillance colonoscopy.  2. Abdominal bloating and vague abdominal discomfort, uncertain cause.  3. Gastroesophageal reflux disease complicated by peptic stricture,      now with recurrent dysphagia, likely due to stricture.  4. Multiple significant medical problems including coronary artery      disease, intrinsic lung disease, and diabetes.   RECOMMENDATIONS:  1. Continue Prilosec.  2. Schedule colonoscopy and upper endoscopy with esophageal      dilatation.  The nature of the procedure as well as the risks,      benefits, and  alternatives have been reviewed.  He understood and      agreed to proceed. He is a higher risk due to his cardiopulmonary      comorbidities.  3. Hold diabetic medicines the day of the examination.  4. Recommend Movi prep (as the patient's prep last time was poor).  5. Ongoing general medical care with Dr. Cathlean Cower.     Docia Chuck. Henrene Pastor, MD  Electronically Signed    JNP/MedQ  DD: 01/21/2007  DT: 01/22/2007  Job #: [id number]   cc:   Biagio Borg, MD

## 2010-08-21 NOTE — Assessment & Plan Note (Signed)
[facility]                            CARDIOLOGY OFFICE NOTE   NAME:Yip, [name]                     MRN:          [id number]  DATE:01/11/2008                            DOB:          06/18/1940    [name] comes in today for further management of his coronary artery  disease and peripheral vascular disease.   His biggest complaint is a constant headache.  He has had some chest  pain off and on that responds to nitroglycerin.  He recently thought he  had pneumonia and Dr. Jenny Reichmann obtained a chest x-ray on December 31, 2007.  He had no acute changes or suggestion of pneumonia.   He continues to smoke quite heavily.  Dr. Gwenette Greet recently put him on  oxygen at night, but he thinks this makes his breathing worse.  He was  told his oxygen levels were low and that is why his head hurts.   MEDICINES:  1. Citalopram 40 mg a day.  2. Prednisone taper.  3. Symbicort inhaler 160/4.5 mcg b.i.d.  4. O2 2 L at night.  5. Flomax 0.4 daily.  6. Metformin 500 mg a day.  7. Pravachol 20 mg a day.  8. Multivitamin.  9. Enteric-coated aspirin 81 mg a day.  10.Vicodin 7.5/650 four daily.  11.Lisinopril 20 mg a day.  12.Xanax 0.25 mg t.i.d.  13.OxyContin.  14.Soma 350 mg 1-2 daily.  15.Glucotrol XL 10 mg b.i.d.  16.Prilosec 20 mg a day.   PHYSICAL EXAMINATION:  GENERAL:  He is a very pleasant disheveled man.  You can hear audible wheezing across the room.  He is slightly clammy  particularly in his upper torso.  VITAL SIGNS:  His weight is 204, his blood pressure is 106/70, pulse 72  and regular.  HEENT:  No acute abnormalities.  Carotid upstrokes are equal bilaterally  without bruits.  No JVD.  Thyroid is not enlarged.  Trachea is midline.  NECK:  Supple.  LUNGS:  Expiratory and inspiratory rhonchi.  HEART:  Soft S1-S2.  ABDOMEN:  Protuberant, good bowel sounds.  No obvious tenderness or  organomegaly.  EXTREMITIES:  No edema.  Pulses were present.   He has dependent rubor  particularly in his feet.  His capillary reflex is delayed.  His  dorsalis pedis and posterior tibial pulses are 1+/4+.  NEUROLOGIC:  Grossly intact.   [name] continues to be extremely high risk for an acute coronary  syndrome or stroke.  As we now discussed his headaches, his drop in O2,  all exacerbated by his smoking.  I have encouraged him again to quit.  I  have made no changes in his medical program.  We will see him back as  scheduled in 6 months.     Thomas C. [name], MD, [facility]  Electronically Signed    TCW/MedQ  DD: 01/11/2008  DT: 01/12/2008  Job #: [id number]

## 2010-08-24 ENCOUNTER — Encounter: Payer: Self-pay | Admitting: Internal Medicine

## 2010-08-24 ENCOUNTER — Ambulatory Visit (AMBULATORY_SURGERY_CENTER): Payer: Medicare Other | Admitting: Internal Medicine

## 2010-08-24 VITALS — BP 97/51 | HR 61 | Temp 98.7°F | Resp 16 | Ht 68.0 in | Wt 198.0 lb

## 2010-08-24 DIAGNOSIS — K222 Esophageal obstruction: Secondary | ICD-10-CM

## 2010-08-24 DIAGNOSIS — R1310 Dysphagia, unspecified: Secondary | ICD-10-CM

## 2010-08-24 DIAGNOSIS — K219 Gastro-esophageal reflux disease without esophagitis: Secondary | ICD-10-CM

## 2010-08-24 DIAGNOSIS — R1013 Epigastric pain: Secondary | ICD-10-CM

## 2010-08-24 LAB — GLUCOSE, CAPILLARY
Glucose-Capillary: 120 mg/dL — ABNORMAL HIGH (ref 70–99)
Glucose-Capillary: 91 mg/dL (ref 70–99)

## 2010-08-24 MED ORDER — SODIUM CHLORIDE 0.9 % IV SOLN: 500.0000 mL | INTRAVENOUS | Status: DC

## 2010-08-24 NOTE — H&P (Signed)
[place]. [facility]  Patient:    [name], [name] Visit Number: [id number] MRN: [id number]          Service Type: DSU Location: [phone] Attending Physician:  Epifanio Lesches Dictated by:   [facility] [place], [place]. Admit Date:  10/12/2001 Discharge Date: 10/13/2001   CC:         Biagio Borg, M.D. [facility]  Dr. Henrene Pastor   History and Physical  CHIEF COMPLAINT:  Lower extremity claudication with abnormal lower extremity Dopplers.  Plan for peripheral angiography.  HISTORY OF PRESENT ILLNESS:  [name] is a 70 year old married white male with a history of multiple medical problems including known CAD, status post CABG, 1999.  His last Cardiolite was in January 2003, which revealed an EF of 60% and no ischemia.  He also has COPD with ongoing tobacco use; adult-onset diabetes mellitus, hypertension, and hyperlipidemia.  When [name] was seen in our office on May 21, 2001, he commented on his feet being cool.  Dr. Rollene Fare felt that his symptoms were probably related to vasoconstriction from smoking.  However, he did have slightly abnormal ABIs on the left in 1989, of 0.71.  For this reason, repeat Doppler studies were performed on July 23, 2001.  These revealed an ABI of the right at 1.0 and on the left at 0.70.  The interpretation of the study suggested that there was a right common iliac artery in excess of less than 50%, normal ABIs on the right at rest.  Proximal left SFA demonstrated diameter reduction of greater than 50%.  Occlusive disease was noted in the distal left SFA with revascularization at the level of the popliteal.  The ABIs on the left demonstrated moderate arterial insufficiency.  The patient was experiencing calf claudication with ambulation of a couple of hundred yards.  As he stopped to rest a few minutes, the symptoms would go away and come right back within the same walking distance.  That had been going on for  some time.  He denied any paresthesias, healing problems, or cyanotic appearance of feet.  He had had no recent chest pain and no shortness of breath.  He was seen back in the office on May 22 for followup with Dr. Rollene Fare.  He was continuing to have lower extremity claudication symptoms.  We discussed the findings of the lower extremity Dopplers.  It was felt that we could proceed with elective peripheral vascular angiogram and possible intervention to the left.  The risks and benefits of the procedure were discussed and the patient and his wife were agreeable to proceed.  Plan to get appropriate preprocedure laboratories, start Plavix 75 mg a day, continue aspirin and other medications.  He will be scheduled for peripheral vascular angiography with Dr. Rollene Fare.  PAST MEDICAL HISTORY: 1. Coronary artery disease.    a. Status post myocardial infarction in 1996.    b. Status post PTCA stent in 1997.    c. Status post CABG in 1999 with SVG to the RCA and an SVG to the obtuse       marginal.    d. Status post Cardiolite January 2003, that showed EF of 60% and no       ischemia. 2. History of right total knee replacement in 1997. 3. Status post left total knee replacement in 1998 with a revision to the    right total knee. 4. Status post right shoulder arthroscopy.  CURRENT MEDICATIONS: 1. Glucotrol XL 5 mg  q.d. 2. Metoprolol 25 mg q.d. 3. Prilosec 20 mg q.d. 4. Zocor 20 mg q.d. 5. Tricor 67 mg 2 tablets a day. 6. Hydrocodone t.i.d. p.r.n. 7. Alprazolam 0.25 mg t.i.d. p.r.n. 8. Nitroglycerin 0.4 sublingual as directed for chest pain. 9. Aspirin 325 mg a day.  ALLERGIES:  No known drug allergies.  SOCIAL HISTORY:  He is married and the father of five and grandfather of 43. 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  He continues to smoke a pack a day of cigarettes.  He has been unable to quit.  No illicit drugs.  No alcohol.  FAMILY HISTORY:   Noncontributory.  REVIEW OF SYSTEMS:  No peptic ulcer disease, no GI bleed, positive hiatal hernia.  No stroke, no TIA.  Positive hyperlipidemia, positive diabetes mellitus.  No thyroid disease.  No fevers, chills.  PHYSICAL EXAMINATION:  VITAL SIGNS:  Blood pressure 124/76, heart rate 48, weight 209, height 5 feet 9 inches.  GENERAL:  The patient is alert and oriented x3 in no acute distress.  He is very talkative.  NECK:  Supple, no JVD, 2+ all carotid pulses with no bruits.  LUNGS:  Clear with no wheezing.  Distant breath sounds.  HEART:  Regular rhythm without murmurs, rubs, or gallops.  ABDOMEN:  Soft without mass, tenderness, or organomegaly.  EXTREMITIES:  Reveal 2+ femoral pulse on the right with no bruit, 1 to 2+ on the left, again no bruit.  Distal pulses on the left show a 1/2 posterior tibial pulse on the left with absent dorsalis pedis pulse on the left.  He has 1 to 2+ distal pulses on the right.  No edema.  No ulcers or cyanosis.  LABORATORY REVIEW:  He had outpatient laboratories drawn on October 07, 2001, which were all stable.  They have been reviewed and are stable for peripheral vascular angiography today.  Creatinine, of note, of 0.7.  Urinalysis was negative.  IMPRESSION/PLAN:  At this time, [name] presents for peripheral vascular angiography by Dr. Terance Ice.  The risks and benefits of the procedure have been discussed and he is agreeable to proceed.  He is on appropriate medications and is scheduled for peripheral vascular angiography this afternoon. Dictated by:   [facility] [place], [place]. Attending Physician:  Epifanio Lesches DD:  10/12/01 TD:  10/14/01 Job: [id number]

## 2010-08-24 NOTE — Discharge Summary (Signed)
NAMEMarland Kitchen  AYODELE, [name]   MEDICAL RECORD NO.:  [id number]          PATIENT TYPE:  INP   LOCATION:  [id number]                         FACILITY:  [facility]   PHYSICIAN:  Tyler Peer, MD       DATE OF BIRTH:  05-01-1940   DATE OF ADMISSION:  01/04/2004  DATE OF DISCHARGE:  01/07/2004                                 DISCHARGE SUMMARY   HISTORY OF PRESENT ILLNESS:  The patient is a 70 year old white married  male, a patient of [name], who comes to the emergency room  with complaints of chest pain.  He was noted also to have sinus bradycardia.  He was seen by [name] and admitted.  His point of care  markers were negative x3.  He apparently has been in detox for OxyContin  use.  They had placed him on clonidine 0.1 mg q.i.d. for withdrawal  symptoms, also Neurontin 300 mg b.i.d., and his Toprol was also continued.  He had some bradycardia symptoms with this and some worsened chest pain,  thus he was admitted.   HOSPITAL COURSE:  He was seen by [name], who recommended that  he could be weaned off his clonidine if it was not needed for his  cardiovascular care.  His Toprol was discontinued and he was to go into the  chemical dependency intensive outpatient program; however, the patient has  declined this and wanted to go home.  An internal medicine consultation was  called because of a decreased TSH.  He had normal free T4 and T3.  It was  felt that this was due to euthyroid, secondary to an acute medical illness  and opioid detoxification.  They recommended followup with [name]  in two to three weeks for repeat TFT's.  Our service had placed him on  theophylline for his sinus bradycardia.  It was noted that his CK's were  elevated to 893 and 605, with low MB's.  It was decided to discontinue his  Vytorin and his Tri-Chlor, to see if this would make a difference.  His CK's  precipitously dropped down to 88;  however, his medications were only held  one day when this happened.  On admission he was placed on a Duragesic 50 mcg patch q.72h.  The patient  states that he was not on this as an outpatient.  His withdrawal from his  narcotics was being done by [name], his primary doctor, as an outpatient.  Their service mentions that he was on the Duragesic patch when he was in the  hospital.  [name] makes no mention in his note about the patient  being on the Duragesic patch.  He recommends ibuprofen for muscle cramps 400  mg to 800 mg t.i.d. p.r.n. Dr. Leslye Gardner saw him on January 07, 2004, and  thought that his Duragesic patch should be discontinued.  Thus it was taken  off, and it was decided that if he was stable several hours later, he would  be discharged home.  [facility]  [name] recommended on January 06, 2004, that his  clonidine be weaned off over a two-week period.  Thus we will do this as an  outpatient.  We will continue with his theophylline now, and we will get a  theophylline level prior to his discharge.  He will see [name].  [name] in two weeks for further decisions about his medications.   LABORATORY DATA:  Free T4 was 1.20.  Hemoglobin 14.7, hematocrit 41.7, WBC's  9.6, platelets 248.  Sodium 142, potassium 4.1, chloride 108, CO2 of 28,  glucose 156, BUN 14, creatinine 0.8.  #1 was a  CK/MB 893/2.4, troponin  0.04, #2 was 605/1.8, troponin of 0.06, #3 was 429/1.9, troponin 0.08.  CK  on January 07, 2004, was 23.  TSH was 0.268, a free T4 was 1.20, free T3 was  2.4.  There was no chest x-ray in the chart at the time of this dictation.  His electrocardiogram showed sinus bradycardia with a rate of 51.  On the day of discharge he had no further dips in his heart rate, and his  blood pressure was 121/85.   DISCHARGE MEDICATIONS:  1.  Theophylline 300 mg, q.12h.  2.  Aspirin 81 mg, one tab  daily.  3.  Plavix 75 mg, one tab daily.  4.  Prevacid 30 mg, one tab daily, or Prilosec 20 mg daily.  5.  Multivitamin one daily.  6.  Imdur 3 mg, one tab daily.  He uses it when he needs it because it gives      him a severe headache.  7.  Neurontin 300 mg, two times daily.  8.  Clonidine 0.1 mg, one time daily x1 week, then q.o.d. x1 week, then he      is to stop this.  9.  Glucotrol XL 5 mg, one time daily.   DISCHARGE INSTRUCTIONS:  1.  He should stop taking his metoprolol for now.  2.  He should stop his Vytorin.  3.  He should stop his Tri-Chlor.   ACTIVITY:  As tolerated.   FOLLOWUP:  1.  The case manager talked to him about going to the intensive outpatient      program.  She has left the phone number for him.  He knows to call if he      decides to go.  2.  He should see [name] in two to three weeks for a thyroid function      test.  3.  He should make an appointment to see [name] Gardner in two weeks.  4.  He does have an appointment on Monday for a chronic pain evaluation.   DISCHARGE DIAGNOSES:  1.  Bradycardia, secondary to medications.  The addition of clonidine along      with his metoprolol/Toprol probably caused his bradycardia.  The      clonidine was being used for withdrawal.  2.  Chest pain, all thought to be musculoskeletal.  He does have a history      of coronary artery bypass graft surgery in 1999.  His last cardiac      catheterization was on November 24, 2003.  He had an occluded saphenous      vein graft to his right coronary artery, but he had good collaterals      from his circumflex and his left anterior descending coronary artery.      His saphenous vein graft to  his obtuse marginal was patent.  His other      grafts were patent.  His ejection fraction was 55%.  3.  Chronic back pain with known lumbar facet arthropathy and spinal      stenosis, followed by Dr. Leeroy Cha and his primary care doctor.     He does have an appointment for a  chronic pain evaluation on Monday.  4.  History of depressive disorder.  Evaluation by [name] and the      hospital, and thought not to be suicidal.  Also he had recommendations      for his narcotic withdrawal.  5.  Gastroesophageal reflux disease.  6.  Hypertension.  7.  Hyperlipidemia.  8.  Chronic obstructive pulmonary disease.  9.  Ongoing tobacco use.  10. Adult onset diabetes mellitus.       BB/MEDQ  D:  01/07/2004  T:  01/08/2004  Job:  [id number]   cc:   Biagio Gardner, M.D. [facility]   Felizardo Gardner, M.D.   Richard A. [name], M.D.  [phone], [place] [address]  Fax: [phone]

## 2010-08-24 NOTE — Discharge Summary (Signed)
[place]. [facility]  Patient:    [name], [name] Visit Number: [id number] MRN: [id number]          Service Type: DSU Location: [phone] Attending Physician:  Epifanio Lesches Dictated by:   Berlin Hun, P.A. Admit Date:  10/12/2001 Discharge Date: 10/13/2001   CC:         Biagio Borg, M.D. [facility]   Discharge Summary  DATE OF BIRTH:  [birth year]  ADMISSION DIAGNOSES:  1. Claudication.  2. Abnormal Doppler studies.  3. Known coronary artery disease, status post coronary artery bypass graft.  4. Ongoing tobacco abuse.  5. Chronic obstructive pulmonary disease.  6. Insulin dependent diabetes mellitus.  7. Hypertension.  8. Hyperlipidemia.  9. Gastroesophageal reflux disease. 10. Chronic pain, followed by the pain clinic.  DIAGNOSES:  1. Claudication and abnormal peripheral vascular studies, peripheral vascular     angiogram shows totaled distal left superficial femoral artery with     popliteal reconstitution, not a candidate for percutaneous intervention     because of the location and difficult visualization.  2. Abnormal Doppler studies.  3. Known coronary artery disease, status post coronary artery bypass graft.  4. Ongoing tobacco abuse.  5. Chronic obstructive pulmonary disease.  6. Insulin-dependent diabetes mellitus.  7. Hypertension.  8. Hyperlipidemia.  9. Gastroesophageal reflux disease. 10. Chronic pain, followed by the pain clinic.  HISTORY OF PRESENT ILLNESS:  The patient is a 70 year old white male patient of [name] with multiple medical problems including coronary artery disease, status post CABG, COPD from ongoing tobacco abuse, GERD, IDDM, hypertension and hyperlipidemia. He also has chronic pain which is followed by the pain clinic.  The patient presented to the office complaining of left lower extremity claudication after a couple hundred yards. He had ABIs on July 23, 2001, revealing a right  of 1.0 and a left of 0.70. For these reasons the patient was scheduled for elective PV angiogram.  PROCEDURES PERFORMED:  Peripheral vascular angiogram on October 12, 2001, by Dr. Rollene Fare, no complications.  HOSPITAL COURSE:  The patient was admitted to [facility] electively on October 12, 2001 for a PV angiogram. Preprocedure laboratory studies showed a white blood cell count 5.8, hemoglobin 13.7, platelets 260, INR 0.99, TSH 1, BUN 12, creatinine 0.7, urinalysis negative.  The patient was taken to the peripheral vascular laboratory by Dr. Rollene Fare on October 12, 2001. This revealed renal was okay, 20% left common iliac artery stenosis, total left SFA distally near the popliteal with reconstitution of the popliteal via SFA collaterals. Unfortunately, the patient was not a candidate for percutaneous intervention because of location and difficult visualization.  Of note during the case the patient was complaining of back pain and anxiety. Dr. Rollene Fare was concerned that it may be related to narcotic withdrawal secondary to Nubain being given. He was treated with morphine with improvement in the patients anxiety level, and the patient was given p.o. Oxycontin, which he takes chronically. The patient remained stable during the case otherwise. Post procedure, he had no complications.  On October 13, 2001, the patients groin was stable, no hematoma. We have ordered a smoking cessation consultation, but the patient is demanding to be discharged. We have reiterated the importance of smoking cessation.  DISCHARGE MEDICATIONS:  1. Glucotrol XL 5 mg.  2. Metoprolol 25 mg a day.  3. Zocor 20 mg a day.  4. ________ 67 mg 2 a day.  5. Aspirin 325 a day.  6. Prilosec 20 mg a day.  7. Nitroglycerin as needed for chest pain.  8. Alprazolam.  9. Hydrocodone. 10. Oxycontin as directed at home  DISCHARGE INSTRUCTIONS:  The patient is to stop smoking. No strenuous activity, lifting over 5 pounds  or driving for 2 days. Low fat, low cholesterol, low salt diet. May shower. He should not soak in the tub or swim for a week. He is asked to call the office with any problems or questions.  FOLLOWUP:  He is to follow up with Dr. Rollene Fare on October 26, 2001, at 11:30 a.m. Dictated by:   Berlin Hun, P.A. Attending Physician:  Epifanio Lesches DD:  10/13/01 TD:  10/15/01 Job: [id number]

## 2010-08-24 NOTE — Consult Note (Signed)
NAME:  [name], [name]                        ACCOUNT NO.:  [id number]   MEDICAL RECORD NO.:  [id number]                   PATIENT TYPE:  REC   LOCATION:  [facility]                                  FACILITY:  [place]   PHYSICIAN:  Arlina Robes, DO                      DATE OF BIRTH:  [birth year]   DATE OF CONSULTATION:  05/27/2002  DATE OF DISCHARGE:                                   CONSULTATION   Dear Dr. Jenny Reichmann:   Thank you very much for kindly referring [name] to [facility].  Mr.  [name] was seen  in our clinic today.  Please refer to the following for details regarding  the history, physical examination, and treatment plan.  Once again, thank  you for allowing [place] to participate in the care of [name].   CHIEF COMPLAINT:  Back pain, left lower extremity pain.   HISTORY OF PRESENT ILLNESS:  The patient is a pleasant 70 year old right-  hand-dominant male who was kindly referred by Dr. Jenny Reichmann for evaluation.  The  patient has a greater than 10 year history of back pain which he states  occasionally radiates into his left buttock, left hip, and into the  posterolateral thigh, and occasionally into his proximal calf.  He denies  any numbness, paresthesias, or weakness.  He states he was evaluated by a  neurosurgeon approximately nine years ago and was told that his back problem  was inoperable.  He has had a couple of MRIs about two years ago.  I do not  have records in regards to his imaging studies.  The patient states he was  followed at [facility] pain clinic about three years ago and had three types of  injections and believes they were epidural steroids injections - which  offered no relief.  He has not had any physical therapy.  He is currently  being treated with medications including OxyContin 80 mg b.i.d. and  hydrocodone 5 mg six to eight times per day as needed for breakthrough pain.  The patient states that his  pain fluctuates between a 6 and 9/10 but his  functional status is significantly improved with the medications.  He states  that he can walk and get around.  He can also do yard work.  He is somewhat  of a poor historian in discussing other treatment modalities tried.  I  reviewed the health and history form, a 14-point review of systems.  The  patient denies fever, chills, night sweats, weight loss, or bowel and  bladder dysfunction.  The patient admits to occasional headaches, arthritis,  back pain, hernia, heartburn, chronic cough, shortness of breath, and  wheezing.  He also admits to heart attack, high blood pressure, chest pain,  occasionally leg pain with walking, and frequently taking aspirin or blood  thinners.  PAST MEDICAL HISTORY:  1. Coronary artery disease.  2. Diabetes.  3. Hypertension.  4. Asbestosis.   PAST SURGICAL HISTORY:  1. Coronary bypass graft.  2. Bilateral total knee arthroplasty.   FAMILY HISTORY:  Heart disease, diabetes, hypertension.   SOCIAL HISTORY:  The patient smokes one pack of cigarettes per day and I  counseled him on the importance of smoking cessation in terms of pain and  overall health.  He denies alcohol or illicit drug use.  He is married and  currently not working secondary to disability.   ALLERGIES:  NUBAIN.   MEDICATIONS:  1. Glucotrol.  2. Prilosec.  3. Zocor.  4. Tricor.  5. Alprazolam 0.25 mg t.i.d.  6. Nitroglycerin.  7. Aspirin.  8. OxyContin 80 mg b.i.d.  9. Vicodin 5 mg/500 mg one p.o. six to eight times per day as needed.  10.      Metoprolol.   PHYSICAL EXAMINATION:  GENERAL:  Reveals a healthy male in no acute  distress.  VITAL SIGNS:  Blood pressure 148/74, pulse 54, respirations 18, O2  saturation is 94% on room air.  NEUROLOGIC:  The patient is alert and oriented.  Mood and affect are  appropriate.  Examination of the back reveals a level pelvis without  scoliosis.  There is decreased lumbar lordosis.   Range of motion of the  lumbar spine is decreased significantly in extension.  Forward flexion is  functional but slightly decreased.  The patient had significant pain on  extension and minimal on flexion.  Side bending and rotation are decreased  as well with some crepitus in the lumbar paraspinous region.  There is  significant tenderness to palpation in the lumbar paraspinous muscles.  Manual muscle testing is 5/5 bilateral lower extremities.  Sensory  examination is intact to light touch bilateral lower extremities.  Muscle  stretch reflexes are 2+/4 bilateral patellar, medial hamstrings, and  Achilles.  There is no ankle clonus noted bilaterally.  Toes are downgoing  bilaterally.  There is no abnormal tone in the lower extremities.  Range of  motion is full at the hips without discomfort.  Straight leg raise is  negative bilaterally.  Corky Sox is negative bilaterally.  The patient has tight  hamstrings and hip flexors.  There is significant tenderness to palpation  over the left greater trochanter.  No heat, erythema, or edema on the lower  extremities.   IMPRESSION:  1. Low back pain, chronic, mechanical and myofascial.  I suspect the patient     has a significant degree of facet arthropathy contributing to his current     symptoms given his physical examination findings.  He has a reported     history of degenerative disk disease which also may be contributing.  2. Trochanteric bursitis, left.  3. Multiple medical problems including coronary artery disease, diabetes     mellitus, and asbestosis.   RECOMMENDATIONS:  1. I discussed treatment recommendations with the patient.  I will obtain     further records to include imaging studies as well as records from     [facility] pain clinic in regards to injection therapy.  The     patient and I discussed minimally invasive procedures to help control his    pain in addition to his current medications.  He is satisfied with his      current medication regimen as it seems to make him more functional and     does a good job controlling his pain  per his report.  I recommend that he     continue to follow with Dr. Jenny Reichmann in regards to his pain medications as he     feels he is stable in that regard.  In regards to interventional     procedures, would consider diagnostic/therapeutic lumbar facet     injections.  Would like to see imaging studies and find out what type of     injections were performed at [facility].  Regardless, the patient     does not seem to be very interested in any interventional procedures at     this time.  2. I recommend a trial of physical therapy or aquatic therapy for range of     motion, stretching, and core stabilization exercises.  Would consider a     trial of TENS unit.  The patient refuses physical therapy and wishes to     talk to Dr. Jenny Reichmann about it.  3. The patient to follow up with his primary care [name].   The patient was educated on above findings and recommendations and  understands.  There were no barriers to communication.  If the patient  wishes to pursue minimally invasive interventional procedures to help his  pain, would be happy to see him back in clinic.  I will be available in this  clinic until mid April 2004.                                               Arlina Robes, DO    JW/MEDQ  D:  05/27/2002  T:  05/27/2002  Job:  [id number]   cc:   Biagio Borg, M.D. [facility]

## 2010-08-24 NOTE — Patient Instructions (Signed)
Discharged instructions given with verbal understanding. Handouts on hiatal hernia and a dilatation diet given. Resume previous medications.

## 2010-08-24 NOTE — Assessment & Plan Note (Signed)
[name]                            CARDIOLOGY OFFICE NOTE   NAME:[name], [name]                     MRN:          [id number]  DATE:07/25/2006                            DOB:          10-04-1940    Mr. [name] comes to use today to establish as his cardiologist.  He has been followed by Dr. Terance Gardner for years.   His problem list is extensive.  1. Coronary artery disease status post coronary artery bypass grafting      x2, September 12, 1997 by Dr. Tharon Aquas Gardner.  His last catheterization      showed an occluded vein graft to the right coronary artery with      good collateral flow from the circumflex and LAD.  Patent saphenous      vein graft to an obtuse marginal.  There was nonobstructive disease      otherwise.  He had well-preserved left ventricular function with      inferior wall hypokinesia, EF of 55%.  2. Systemic hypertension with normal renal arteriography in 2005.  3. Mixed hyperlipidemia.  Currently, he is not taking TriCor because      he cannot afford it.  4. Heavy tobacco use with COPD.  5. History of heavy alcohol use, no longer a problem.  6. Gastroesophageal reflux disease.  7. Peripheral arterial disease with a left superficial femoral artery      occlusion with 3-vessel run-off.  His wife says a stent could not      be placed by Dr. Rollene Gardner because of bilateral total knee      replacements.   He is having no angina.  He has a lot of chronic pain and is followed by  the chronic pain clinic.   PAST MEDICAL HISTORY:  In addition to the above, he is intolerant of  NUBAIN and MORPHINE.   CURRENT MEDICATIONS:  1. Prilosec 20 mg over-the-counter b.i.d.  2. Actos 30 mg a day.  3. Glucotrol XL 10 mg b.i.d.  4. Toprol 25 mg a day.  5. Soma 350 mg 1 to 2 daily.  6. Citalopram HBR 20 mg daily.  7. Plavix 75 mg a day, which he is no longer taking because he cannot      afford it.  8. Oxycodone 80 mg 6 daily.  9. Alprazolam 0.25 t.i.d.  10.Lisinopril 20 mg a day.  11.TriCor 45 mg a day, which he is not taking because of the finances.  12.Vicodin 7.5/650 four daily.  13.Effexor XR 150 daily.  14.Isosorbide mononitrate extended release 30 mg p.r.n.  15.Aspirin 81 mg a day.  16.Multivitamin daily.  17.Methylprednisolone 4 mg daily, currently on a taper for his COPD.  18.Nitroglycerin p.r.n.  His last chest x-ray showed COPD with mild      cardiomegaly, with a right nipple shadow.   He drinks coffee throughout the day.   He does not exercise.  He is very limited with his legs.   SURGERIES:  He has had bilateral knee replacements, bilateral carpal  tunnel, and also coronary artery bypass grafting as mentioned above.  FAMILY HISTORY:  Noncontributory.   SOCIAL HISTORY:  He is disabled.  He used to work in the [employer].  He  is married and has 5 children.  His wife is with him today.   REVIEW OF SYSTEMS:  Other than the HPI, he has a history of allergies  and hay fever, chronic bronchitis with wheezing, chronic respiratory  problems and breathing, fatigue, hiatal hernia, gastroesophageal reflux  disease, arthritis, diabetes, high blood pressure, anxiety and  depression.  Other review of systems are negative.   EXAM:  He is chronically ill.  His blood pressure is 117/70, pulse 81  and regular, weight is 195.  He is 5 feet 8 inches.  HEENT:  Normocephalic, atraumatic.  PERRLA.  Extraocular muscles are  intact.  Sclerae clear.  Facial symmetry is normal.  NECK:  Supple.  Carotid upstrokes are equal bilaterally without bruits.  There is no JVD.  Thyroid is not enlarged.  Trachea is midline.  He has audible wheezing.  Respiratory rate is 20 and slightly labored.  He has inspiratory and expiratory rhonchi.  No rales.  PMI is poorly appreciated.  He has normal S1, S2.  Sternotomy site is  stable.  ABDOMEN:  Protuberant with good bowel sounds.  There is no obvious  organomegaly.  EXTREMITIES:   Reveal 1+ edema.  Pulses were 1+/4+ dorsalis pedis on the  left.  Hard to feel with posterior tibial on the left.  Right was 2+/4+  both dorsalis pedis and posterior tibial.  There was no sign of DVT.  There were venous varicosities.  NEURO:  Intact.   ELECTROCARDIOGRAM:  Essentially normal.   ASSESSMENT AND PLAN:  Mr. [name] is a chronically ill 70-year-  old gentleman with multiple medical problems.  Please see the problem  list above.   His current meds are fairly stable.  He cannot afford Plavix, unless it  is generic, which I have told him it is not.  Will stay on aspirin  alone.  He would like a generic for TriCor and I have  written him for fenofibrate 145 mg a day.  Hopefully, he can get this  through a generic supply.  I have renewed his nitroglycerin sublingual  tablets.  I will see him back in 6 months.     Tyler C. [name], MD, [facility]  Electronically Signed    TCW/MedQ  DD: 07/25/2006  DT: 07/26/2006  Job #: [id number]   cc:   Tyler Borg, MD

## 2010-08-24 NOTE — Op Note (Signed)
[place]  Patient:    [name], [name]                     MRN: [id number] Proc. Date: 02/04/00 Adm. Date:  [id number] Attending:  Meriel Flavors                           Operative Report  PREOPERATIVE DIAGNOSIS:  Status post right total knee replacement with persistent laxity, increasing amount despite previous revision of tibial component.  POSTOPERATIVE DIAGNOSIS:  Status post right total knee replacement with persistent laxity, increasing amount despite previous revision of tibial component, with evidence of contact between inferior patella and anterior tibial component because of elevated joint line.  PROCEDURE:  Right knee examination under anesthesia with open intervention. Revision of femoral component to a stemmed, cemented component, as well as revision of polyethylene tibial component.  Osteonics prosthesis.  Utilized a #7 reconstructive distal femoral component with 10 mm augmentation distally, medial and lateral.  5 mm augmentation posteriorly, medial and lateral.  Also, this was a stemmed component with a 14 mm x 155 mm stem, all cemented.  Tibial component was revised from a Series II 24 mm polyethylene component to an 18 mm stemmed TS component.  SURGEON:  Ninetta Lights, M.D.  ASSISTANT:  Aaron Edelman D. Petrarca, P.A.-C.  ANESTHESIA:  General.  ESTIMATED BLOOD LOSS:  Minimal.  TOURNIQUET TIME:  1 hour 30 minutes.  SPECIMENS:  Excised bone and soft tissue.  CULTURES:  None.  COMPLICATIONS:  None.  DRESSING:  Soft compressive with knee immobilizer.  DESCRIPTION OF PROCEDURE:  Patient brought to the operating room and placed on the operating table in supine position.  After adequate general anesthesia had been obtained, the right knee was examined.  A few degrees of recurvatum. Excessive anterior-posterior translation, most of this anterior.  Flexion to 115 degrees and full extension, actually 5 degrees of  recurvatum.  Evidence of contact of the inferior pole of the patella to the tibial component in flexion.  Collaterals have some laxity but not grossly unstable.  Alignment reasonably good.  Tourniquet applied, prepped and draped in the usual sterile fashion.  Exsanguination by elevation and Esmarch, tourniquet inflated to 350 mmHg.  Previous incision was opened, extending into a medial parapatellar arthrotomy.  The femoral component and tibial component were well-fixed. There was evidence of erosion on the front of the polyethylene of the tibial component with the inferior pole of the patella, where there was actually bony divoting in this area and partial tearing of the patellar tendon from contact there.  The PCL was still intact but had some degree of laxity to it.  It was obvious that we were going to need to revise this and change the axis of the joint line in order to avoid tibial-patellar contact.  As I could not further gain a tighter knee by revising the tibial component because it was at maximum size, the tibial component was removed.  I then used a very thin osteotome to carefully remove the femoral component, as there was bony ingrowth.  This was able to be removed without losing much bone at all.  The tibial component, which was cemented below this, was well-fixed and not loose at all.  The patellar component was well-fixed and not loose at all.  The areas of contact and _____ on the bottom of the patella were debrided to a stable surface, removing  the bony overgrowth to limit contact with the new tibial component. Utilizing the revision instruments, the distal femur was sized for a #7 component.  Cuts were made utilizing the revision jigs and with a 10 mm distal insert to bring the femoral component down further.  Once the cuts had been made preserving as much bone as possible, I did a utilization of the revision jig for placement of the stem and box insert for the posterior  stabilizing component to improve that stability.  Once all these cuts had been made, a TS #7 femur with 5 mm posterior inserts and 10 mm distal inserts was well seated and fitted the femur, restoring normal rotation based on the epicondyle of the femur, with good capturing and fitting.  Because this was brought down distally to bring the joint down further, I chose to use a much longer stem in order to get a capture, some fixation within the femoral shaft.  With that femoral component in place and utilizing now an 18 mm TS polyethylene component on the tibia, I had full extension, full flexion, excellent stability.  The anterior-posterior instability was markedly improved.  The PCL had been resected.  Also, there was no longer any contact between the front of the tibial component and the inferior pole of the patella.  Further release around the patella was not indicated.  All trials removed.  The knee was then copiously irrigated with the pulse irrigating device.  The femoral component was procured with the 2 mm offset which had been chosen based on the trials utilized.  The offset was placed at the 12 oclock position on the femoral component.  The augmentations distally and posteriorly were attached to the femoral component.  Cement was prepared, applied to the femoral component and not to the shaft.  It was then well-seated up into the femur, giving excellent capture, alignment, stability, and fixation.  The polyethylene for the tibia was then inserted into the previous tibial component, and a metal post seated down the polyethylene post on the tibia.  Once this cement had hardened, the knee was re-examined.  I had full extension, good end point, full flexion more than 130 degrees.  Markedly improved translation and stability.  Also with the bringing of the joint line down, I no longer had any tibial-patellar contact. There was good patellofemoral tracking and lateral release not  indicated.  I felt like we had eradicated the problem of laxity.  There was an overall increasing of the inserts by 4-5 mm once I had subtracted some of the tibia but then added a considerable more on the femur.  Collaterals had good  stability in flexion and extension.  The wound was irrigated.  Two Hemovacs were placed through separate stab wounds.  Arthrotomy closed with #1 Vicryl, skin and subcutaneous tissue with Vicryl and staples.  Margins of the wound injected with Marcaine.  Sterile compressive dressing applied.  Tourniquet deflated and removed.  Knee immobilizer applied.  Anesthesia reversed, brought to the recovery room.  Tolerated the procedure well with no complications. DD:  02/04/00 TD:  02/04/00 Job: [id number]

## 2010-08-24 NOTE — Assessment & Plan Note (Signed)
MEDICAL RECORD NUMBER:  [id number].   INTERVAL HISTORY:  A 70 year old male with low back pain dating at least 10  years.  Recently detoxified at the end of September or early October of  2005.  Seen by me on January 10, 2004, because of a urine drug screen that  was positive for methadone, which was confirmed.  I only felt comfortable  treating him with Ultram.  In the interval period, he said that he had seen  Dr. Joya Salm, who felt that surgical intervention was not necessary for the  patient's problem.  The patient reported to see Dr. Rollene Fare and he states  that he obtained OxyContin from Dr. Rollene Fare.   REVIEW OF SYSTEMS:  He has some congestion, poor sleep and headaches.  Please refer to the review of system sheet.   PHYSICAL EXAMINATION:  Pain score 8/10, mostly left hip and lower extremity.   Blood pressure 143/61, pulse 56, respirations 16.  Gait is normal.  Affect  is bright.  Appearance is normal.  He does have a runny nose.  No nausea.   Generally in no acute distress.   IMPRESSION:  Lumbar spinal stenosis.  I outlined my treatment plan, which  was a combination of injection therapy and low-potency narcotic analgesia  given his problems with OxyContin addiction in the past.  He violated his  controlled substance agreement and we discussed that at this point we will  give him names of other pain clinics.  I have given him another 30-day  supply of Ultram.       AEK/MedQ  D:  02/06/2004 13:16:01  T:  02/06/2004 [id number]  Job #:  [id number]

## 2010-08-24 NOTE — Cardiovascular Report (Signed)
NAME:  [name]                        ACCOUNT NO.:  [id number]   MEDICAL RECORD NO.:  [id number]                   PATIENT TYPE:  OIB   LOCATION:  2899                                 FACILITY:  [facility]   PHYSICIAN:  Richard A. Rollene Fare, M.D.          DATE OF BIRTH:  [birth year]   DATE OF PROCEDURE:  11/24/2003  DATE OF DISCHARGE:  11/24/2003                              CARDIAC CATHETERIZATION   PROCEDURE:  Retrograde central aortic catheterization, selective coronary  angiography via Judkins technique, LV angiogram RAO, LAO projection,  subselective LIMA, RIMA, saphenous vein graft angiography, abdominal  angiogram PA projection.   DESCRIPTION OF PROCEDURE:  The patient was brought to the second floor CP  lab in postabsorptive state after 5 mg of Valium p.o. premedication.  The  patient was same day admission with normal preop laboratory coags, CBC, diff  and BMP with BUN and creatinine 14/0.7.  The patient was brought to the  second floor CP lab.  The right groin was prepped, draped in the usual  manner.  1% Xylocaine was used for local anesthesia.  He has been on chronic  OxyContin for back pain.  He was given morphine for a total 4 mg IV for  sedation in divided doses during the procedure.  The CRFA was entered with  single anterior puncture using 18 thin-wall needle and a 6 French short Daig  side arm sheath was inserted without difficulty.  Diagnostic coronary  angiography was done with 6 French 4 cm taper Cordis preformed coronary and  pigtail catheters using Omnipaque dye throughout the procedure.  Subselective LIMA, RIMA and saphenous vein graft angiography was done with  the right coronary catheter.  LV angiogram was done in the RAO and LAO  projection at 25 mL, 14 mL per second and 20 mL, 12 mL per second.  Pullback  pressure of the CA showed no gradient across the aortic valve.   Abdominal aortic angiogram was done above the level of the renal arteries at  30 mL,  20 mL per second with visualization to the distal iliacs bilaterally.  This demonstrated mild infrarenal atherosclerotic disease with no aneurysm  or stenosis.  Single normal renal arteries bilaterally.  Normal iliac  bifurcation, proximal iliacs and intact hypogastrics.  Catheter was removed,  side arm sheath was flushed.  The patient was brought to the holding area  for sheath removal and pressure hemostasis.  She tolerated the procedure  well.   PRESSURES:  1. LV:  140/0; LVEDP 18 mmHg.  2. CA:  140/70 mmHg.  3. There is no gradient across the aortic valve on catheter pullback.   Fluoroscopy showed 2+ calcification of the left and right coronary arteries.  Previously placed right coronary stents were also visualized  fluoroscopically.   LV angiogram demonstrated mild hypokinesis of the inferior wall with fairly  well preserved wall motion in this area.  Essentially normal posterior wall  motion.  Estimated  EF was approximately 55%.  There is no significant mitral  regurgitation.   There was no brachiocephalic or subclavian stenosis and IMAs were both  patent with antegrade vertebral flow bilaterally.   The main left coronary was normal.   The left anterior descending artery had some minimal irregularities, coursed  to the apex of the heart where it bifurcated.  It was essentially widely  patent with no significant stenosis and good flow throughout.  There was  some bend at the junction of the mid portion and the junction of the  distal third of the RCA possibly representing bridging.  However, there was  no systolic compromise present and there was good flow throughout.   There was bifurcating moderate size first diagonal before SP1 in  the  proximal LAD that was normal.  There was a small to moderate size DX2 at the  junction of the proximal third that was normal and there was a small DX3  from the mid LAD that was normal.   The circumflex artery gave off a moderate size  long OM-1 that had no  significant stenosis, arose very proximally.  The moderate size OM-2 had no  significant stenosis just beyond this and bifurcated.  There was 50% smooth  narrowing just beyond this and before the PABG branch.  The PABG branch  provided grade 3 collaterals to the distal RCA.  There were also collaterals  from the distal LAD to the distal RCA.  The PDA and PLA were visualized up  to their distal origin.   The circumflex artery beyond the PABG branch had 95% segmental stenosis  before the insertion of the graft which was seen on retrograde filling.   The right coronary was totally occluded in its proximal third just before  the previously placed tandem proximal and mid RCA stents.  There was no  antegrade flow.   Saphenous vein graft to the RCA was totally occluded in its proximal portion  with a bullet shaped configuration.   The saphenous vein graft to the obtuse marginal was widely patent.  There  was a large valve in the proximal third, but there was excellent flow and an  excellent anastomosis to the distal marginal branch.  It filled the distal  marginal branches and its branches well antegrade.   DISCUSSION:  Mr. [name] is a 70 year old married father of five with 14  children.  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  He smokes one to two packs of cigarettes  chronically for almost 50 years and had heavy ETOH in the past, but none for  over 10 years.  He has a history of coronary disease with remote prior right  coronary interventions with stenting in 1998 ([place] stents) and 1999 with HSRA  for ISR and associated duet stents.  He had multiple right coronary  procedures, but ultimately because of progression of disease required  coronary artery bypass graft x2 by Dr. Tharon Aquas Trigt on September 12, 1997 with  saphenous vein graft to the RCA and saphenous vein graft to the OM.  He has been treated medically  since that time.  He has AODM, continues to smoke,  bilateral knee replacements, left distal SFA occlusion with stable left  lower extremity claudication, chronic back pain with chronic narcotic  therapy (OxyContin), exogenous obesity and chronic obstructive pulmonary  disease.   He was restudied because of recurrent chest pain that was occurring  on a  daily basis over the last month.  It occurred at exertion with rest, had  some typical and atypical features.   The patient has occlusion of the saphenous vein graft to his right.  The  angiographic appearance to this appears relatively recent with bullet-shape  configuration.  However, there are grade III collaterals from the circumflex  and excellent collaterals from the distal LAD so I suspect that this is a  chronic process.   He has no significant LAD diagonal disease or disease of the first two  marginals.  The patient could have exertional discomfort from inadequate  collaterals to the distal right.  However, I would look for other sources of  his chest pain either musculoskeletal or GI and I would recommend continued  medical therapy.  He has a patent graft to the distal marginal so his  circulation is protected and his risk of myocardial infarction is low.  He  does, however, have a risk of progression of disease with his obesity,  diabetes and continued cigarette abuse.   He may be a candidate for EECP in the future.   Recommend reassurance and medical therapy and continued advice and  counseling about discontinuation of smoking.   CARDIAC ASSESSMENT:  1. Chest pain, etiology not determined.  2. Multiple prior right coronary artery interventions as outlined above in     1988 and 1999, stenting and HSRA.  3. Status post coronary artery bypass graft x2 September 12, 1997, Dr. Prescott Gum.     A. Occluded saphenous vein graft to right coronary artery with excellent        grade 3 collaterals from circumflex and left anterior  descending.     B. Patent saphenous vein graft to obtuse marginal.     C. No significant left anterior descending, diagonal or OM-1, OM-2        disease.     D. Well-preserved left ventricular function, inferior wall motion        abnormality, ejection fraction approximately 55%.  4. Systemic hypertension, normal renal arteries.  5. Hyperlipidemia on therapy, good control.  6. Continued cigarette abuse, chronic obstructive pulmonary disease.  7. Gastroesophageal reflux disease.  8. Peripheral artery disease with total distal left superficial femoral     artery occlusion with three-vessel runoff and collaterals.  Stable     claudication.  9. Bilateral total knee replacements.  10.      Chronic back pain on chronic narcotics.  11.      Exogenous obesity.                                               Richard A. Rollene Fare, M.D.    RAW/MEDQ  D:  11/24/2003  T:  11/24/2003  Job:  [id number]   cc:   Biagio Borg, M.D. [facility]

## 2010-08-24 NOTE — Procedures (Signed)
NAME:  [name], [name] NO.:  [id number]   MEDICAL RECORD NO.:  [id number]          PATIENT TYPE:  OUT   LOCATION:  SLEEP CENTER                 FACILITY:  [facility]   PHYSICIAN:  Kathee Delton, MD,FCCPDATE OF BIRTH:  [birth year]   DATE OF STUDY:                            NOCTURNAL POLYSOMNOGRAM   REFERRING PHYSICIAN:  Kathee Delton, MD,FCCP   REFERRING PHYSICIAN:  Kathee Delton, M.D.   INDICATION FOR STUDY:  Hypersomia with sleep apnea.   EPWORTH SLEEPINESS SCORE:  Is 22.   SLEEP ARCHITECTURE:  The patient had a total sleep time of 397 minutes  with no slow-wave sleep or REM. Sleep-onset latency was very rapid at 5  minutes and sleep efficiency was decreased at 89%.   RESPIRATORY DATA:  The patient was found to have no obstructive apneas  or hypopneas during the entire night. He was, however, found to have  very loud snoring, and it should be noted that he never achieved slow-  wave sleep or REM.   OXYGEN DATA:  There was O2 desaturation as low as 81% spontaneously  during the night. The patient overall spent 41 minutes at less than 88%  over the course of the study. Oxygen was added and ultimately increased  to 2 liters per protocol.   CARDIAC DATA:  No clinically significant arrhythmias were noted.   MOVEMENT-PARASOMNIA:  There were no clinically significant leg jerks or  abnormal behaviors.   IMPRESSIONS-RECOMMENDATIONS:  1. No apneas or hypopneas noted during the entire night. The patient      never achieved slow-wave sleep or REM, and this certainly can      underestimate the degree of the patient's sleep-disordered      breathing. However, the patient had more than ample opportunity to      exhibit clinically significant obstructive sleep apnea.  2. O2 desaturation spontaneously as low as 81%, and oxygen was added      up to 2 liters per minute as part of the      sleep-center protocol. It is unclear from the study whether the      patient  needs nocturnal oxygen, and I would recommend overnight      oximetry on room air as an outpatient.      Kathee Delton, MD,FCCP  Diplomate, [place] Board of Sleep  Medicine  Electronically Signed     [facility]/MEDQ  D:  08/18/2007 15:29:03  T:  08/18/2007 15:47:47  Job:  [id number]

## 2010-08-24 NOTE — Cardiovascular Report (Signed)
[place]. [facility]  Patient:    FALCON, CUCINELLA Visit Number: [id number] MRN: [id number]          Service Type: DSU Location: [phone] Attending Physician:  Epifanio Lesches Dictated by:   Nanine Means Rollene Fare, M.D. Proc. Date: 10/12/01 Admit Date:  10/12/2001 Discharge Date: 10/13/2001   CC:         Biagio Borg, M.D. [facility]  Ninetta Lights, M.D.   Cardiac Catheterization  PROCEDURE:  Retrograde abdominal aortic catheterization, abdominal aortic angiogram midstream PA projection, bilateral lower extremity runoff using DSA and "bolus-chase technique," left femoral angiogram oblique projection.  INDICATIONS:  Mr. Shrewsbury is a 70 year old white disabled married father of five with 14 grandchildren who used to be a Nutritional therapist.  He continues to smoke a pack a day.  He is on chronic hydrocodone for back and knee pain under the care of Dr. Judi Cong and Dr. Percell Miller.  He is status post right total knee replacement in 1997, left total knee replacement in 1998, and right total knee replacement and revision on February 04, 2000.  He has a history of exogenous obesity, COPD, continued cigarette abuse, hyperlipidemia, AODM, systemic hypertension, and known coronary artery disease.  HE has had remove right coronary stenting.  Because of progression of single vessel disease in the setting of unstable angina, he underwent recatheterization and subsequent CABG x2 by Dr. Prescott Gum in 1999 with SVG to the OM and SVG to the RCA.  He has had a negative Cardiolite in follow-up on January of 2003 with an EF of 60% and no ischemia.  He has at least six months of progressive claudication of the left lower extremity on approximately 75 to 100 yards with left calf pain.  He has chronic knee and chronic back pain.  ABIs on July 23, 2001, showed an RABI of 1.0 and an LABI of 0.7.  He was felt to have probable occlusion of his distal left SFA with monophasic three  tibial vessel indices on the left.  Essentially normal indices on the right.  Informed consent was obtained to proceed with peripheral angiography to assess his anatomic situation for possible intervention or surgery for his symptomatic claudication.  DESCRIPTION OF PROCEDURE:  He was brought to the sixth floor PV lab in the postabsorptive state, premedicated with 5 mg of Valium p.o.  He was given 2 mg of Versed for sedation at the beginning of the procedure.  The right femoral artery was entered with a single anterior puncture using an 18 thin wall needle and a 5 French short Cordis sidearm sheath was inserted without difficulty.  A 5 French pigtail catheter was placed above the level of the renal arteries over a Wholey wire that was used throughout the procedure. Abdominal aortic angiogram was done with DSA with 20 cc, 20 cc per second above the level of the renal arteries and a second injection above the iliac bifurcation.  Bilateral lower extremity runoff was then done with steptable "bolus-chase" technique at 88 cc, 8 cc per second with visualization to the feet bilaterally.  Because of his total knee replacements, it was very difficult to see his distal femoral and popliteal vessels behind the knee particularly on the symptomatic left side.  For this reason, the left common iliac was accessed with a 5 [place] IMA catheter.  A 0.035 inch glide wire was used to access the LEIA and the catheter was exchanged for a 5 French endhole catheter.  Left  femoral and popliteal angiograms were done in oblique projections with DSA imaging by hand injection.  The patient complained of back pain and developed progressive anxiety and a panic-type reaction in the procedure.  He was initially given 2 mg of Nubane for sedation and 2 mg of Versed.  He responded poorly to this.  He remained conscious, blood pressure transiently rose to 220 mmHg and he became more agitated.  It was felt that this patient 70 may be having a withdrawal reaction from narcotics related to Nubane administration.  He was taken off of the table and brought to the holding area with the sheath in place.  He was given 4 mg of morphine sulfate in 2 mg divided doses because of presumed narcotic withdrawal and associated anxiety and panic attack.  The patient was awake at this time, continued high anxiety, no complaints of pain, and demanded to have an Oxycontin.  He was given one Oxycontin p.o. at this time.  He will be watched carefully post angiography for further development of narcotic withdrawal and/or anxiety attacks.  It should be noted that he does have a history of "panic attacks" in the past.  Arterial pressures were monitored throughout the procedure and ranged initially from 180 to 190 mmHg up to 220 during the period of anxiety and came down to the 180 range and he maintained sinus rhythm.  There was no chest pain or arrhythmia.  ABDOMINAL AORTIC ANGIOGRAM:  Revealed single patent renal arteries bilaterally. Patent proximal celiac and SMA axis.  There was moderate infrarenal atherosclerosis with eccentric mild ulcerative distal aortic lesions.  The IMA was intact.  Several lumbar branches were visible and normal.  The LCIA had 20% smooth narrowing and it was somewhat tortuous.  The RCIA appeared normal.  The LEIA was a widely patent, smooth, and normal vessel.  The LCFA was normal and the left profunda SFA bifurcation showed no significant stenosis.  The REIA was widely patent and smooth with no significant stenosis.  The right profunda SFA bifurcation was normal.  The hypogastrics were intact bilaterally.  The right SFA had mild irregularities, but was widely patent.  It was not seen entirely in this projection behind the right knee metallic prosthesis, but good flow was visualized without collaterals and it was presumed to be widely patent.  The tibial trifurcation showed three vessel runoff to the  right foot  with no significant stenosis.  The left SFA had no significant stenosis up to the area of [place]. At about the area of [place], it was totally occluded.  There were SFA collaterals on both sides of the total occlusion with reconstitution of flow to the mid left popliteal above the knee joint.  The left tibial trifurcation was intact and there was slow three vessel runoff to the left foot.  Oblique views of the left SFA demonstrated total occlusion of the distal SFA popliteal with reconstitution of the mid popliteal as above by SFA collaterals.  DISCUSSION:  From an anatomic standpoint, this patient is a poor candidate to consider any percutaneous revascularization.  This is in part due to the fact that there is very poor visualization despite multiple angulations related to his left knee total prosthesis.  I believe he would be best served as a candidate for possible short segment fem/pop or fem/tibial bypass for symptomatic relief of this claudication.  Obviously his chronic narcotic abuse is an ongoing problem and we will have to defer to his orthopedic and primary physicians  for management of this.  Cigarette abuse has been chronic and the patient has had no desire to quit in the past and has declined any further counseling or help for medications in this regard.  Recommend continued medical therapy of his multiple other problems.  CATHETERIZATION DIAGNOSES:  1. Peripheral vascular disease - claudication left lower extremity, abnormal     ABI, and total occlusion short segment left distal SFA popliteal with     reconstitution by SFA collaterals.  2. Coronary artery disease.     a. Remote percutaneous transluminal coronary angioplasty and stent        right coronary artery.     b. Progression of disease with unstable angina and subsequent coronary        artery bypass graft x2 in 1999, negative Cardiolite in February of        2003.  3. Systemic  hypertension.  4. Exogenous obesity.  5. Cigarette abuse, chronic bronchitis continued.  6. Hyperlipidemia.  7. Adult onset diabetes mellitus.  8. Gastroesophageal reflux disease.  9. History of past panic attacks. 10. Hyperlipidemia. 11. Chronic narcotic dependence with chronic back pain. Dictated by:   Nanine Means Rollene Fare, M.D. Attending Physician:  Epifanio Lesches DD:  10/12/01 TD:  10/14/01 Job: [id number]

## 2010-08-24 NOTE — Op Note (Signed)
NAME:  [name], KOSIOR              ACCOUNT NO.:  [id number]   MEDICAL RECORD NO.:  [id number]          PATIENT TYPE:  AMB   LOCATION:  [place]                          FACILITY:  [facility]   PHYSICIAN:  Ninetta Lights, M.D. DATE OF BIRTH:  Oct 12, 1940   DATE OF PROCEDURE:  02/15/2004  DATE OF DISCHARGE:                                 OPERATIVE REPORT   PREOPERATIVE DIAGNOSIS:  Chronic, massive rotator cuff tear, right shoulder,  with impingement and degenerative joint disease acromioclavicular joint,  irreparable long head biceps tendon tear.   POSTOPERATIVE DIAGNOSIS:  Chronic, massive rotator cuff tear, right  shoulder, with impingement and degenerative joint disease acromioclavicular  joint, irreparable long head biceps tendon tear, with grade 2 and 3 changes  glenohumeral joint.   OPERATIVE PROCEDURE:  Right shoulder exam under anesthesia, arthroscopy with  debridement of glenohumeral joint, debridement irreparable cuff,  acromioplasty, coracoacromial ligament release, excision distal clavicle.   SURGEON:  Ninetta Lights, M.D.   ASSISTANT:  Alyson Locket. Ricard Dillon, P.A..-C.   ANESTHESIA:  General.   ESTIMATED BLOOD LOSS:  Minimal.   SPECIMENS:  None.   CULTURES:  None.   COMPLICATIONS:  None.   DRESSINGS:  Soft compressive with a sling.   PROCEDURE:  The patient was brought to the operating room and placed on the  operating table in supine position.  After adequate anesthesia had been  obtained, the right shoulder was examined.  Full motion, stable shoulder.  He was placed in a beach chair position on the shoulder positioner, prepped  and draped in the usual sterile fashion.  Three portals created, anterior,  posterior, and lateral.  The shoulder was entered with a blunt obturator,  distended, and inspected.  Marked irreparable supraspinatus tear retracted  medial to the glenoid completely scarred in, nothing that was reparable or  mobile.  The supraspinatus torn about halfway  down, the subscap torn down to  the top of the subscap.  These were also very fixed and not mobile, so even  repair bringing these side-to-side was not an option.  The long head biceps  tendon tear chronic and absent.  Grade 2 changes throughout the glenohumeral  joint debrided.  Low grade free fragment, as well.  Labrum debrided  throughout.  All recess examined, no other findings within the shoulder,  itself.  Type 3 acromion.  Chronic impingement.  Acromioplasty to a type 1  acromion with shaver and high speed bur.  Grade 4 changes AC joint with  marked spurring there.  Distal clavicle resected with shaver and high speed  bur removing all spurs.  Adequacy of decompression, cuff debridement, and  acromioplasty confirmed viewing from all portals.  The cuff was thoroughly  assessed from all angles and repair was not a viable option.  Instruments  and fluid removed.  The portals, shoulder and bursa injected with Marcaine.  The portals were closed with 4-0 nylon.  A sterile compressive dressing was  applied.  A sling applied.  Anesthesia reversed.  Brought to the recovery  room.  Tolerated the surgery well without complications.  Darden Dates   DFM/MEDQ  D:  02/15/2004  T:  02/15/2004  Job:  [id number]

## 2010-08-24 NOTE — H&P (Signed)
[facility]  Patient:    Gardner, Tyler Visit Number: [id number] MRN: [id number]          Service Type: PMG Location: TPC Attending Physician:  [name] Dictated by:   [name], M.D. Adm. Date:  11/27/2000   CC:         [name], M.D. [facility]   History and Physical  NEW PATIENT EVALUATION  HISTORY OF PRESENT ILLNESS:  [name] is a 70 year old who was sent to [place] by Dr. Cathlean Cower for evaluation and recommendations for pain management for his underlying lumbar spondylosis with spinal stenosis.  The patient has a long history of back pains which he relates from his 25s and 30s.  He did not see a physician until about five years ago when he was evaluated by Dr. Christianne Dolin who did not advocate any surgical intervention.  He went from there to Dr. Robyne Peers at the [facility] and got three shots of epidural steroids with no improvement.  He actually felt like his last injection exacerbated his problems.  Since then he has been maintained on Vicodin up until about three months ago and was on OxyContin 40 mg 2 x q.d. and since then he has done much better.  He notes that this allows him to at least enjoy life to a degree.  He describes his pain as a stiffness that runs across his back and predominantly into the left lower extremity to the midcalf.  It is associated with numbness and tingling and weakness in the left lower extremity, but denies any bowel or bladder incontinence.  It is made worse by just getting out of bed some mornings, yard work, or any heavy lifting, and it is improved by the Vicodin/OxyContin mixtures while he is lying down in a recliner.  He recently had to step up to the plate to help take care of his wife who was diagnosed with cancer and is undergoing surgical resection and radiation therapy.  He has been tried on Neurontin with no improvement and amitriptyline which caused somnolence.  He is  quite comfortable with the OxyContin/Vicodin regimen, but as I explained to him he needs to be taking the Vicodin only on a sparing basis.  CURRENT MEDICATIONS:  1. Glucotrol XL 10 mg 1 x q.d.  2. Metoprolol 25 1 x q.d.  3. Prilosec 20 mg 1 x q.d.  4. Altace 25 mg 1 x q.d. 5. Tricor 50 mg b.i.d.  6. Zocor 20 mg 1 x q.d.  7. Vicodin 5/500 1-2 p.o. q.6-8h. p.r.n.  8. Alprazolam 0.25 mg q.8h.  9. OxyContin 40 mg b.i.d. 10. Enteric coated aspirin. 11. Multivitamins. 12. Albuterol inhaler.  ALLERGIES:  No known drug allergies.  FAMILY HISTORY:  Positive for coronary artery disease, strokes, osteoarthritis, and diabetes.  ACTIVE MEDICAL PROBLEMS:  Diabetes diagnosed in 1998 with peripheral neuropathy involving the hands and feet to a degree.  He denies any eye or kidney problems.  Coronary artery disease status post coronary artery bypass grafting and stents.  Gastroesophageal reflux disease/hiatal hernia; hypertension, history of asbestosis with asthma per Dr. Melvyn Novas.  SOCIAL HISTORY:  The patient is a pack-per-day smoker and he does not drink alcohol.  He has been on social security disability since 1996.  He formerly worked in [employer] out in [place].  PAST SURGICAL HISTORY:  Significant for carpal tunnel release, bilateral knee replacements by Dr. Percell Miller with subsequent surgeries on the right knee. Coronary artery bypass  grafting and stents.  REVIEW OF SYSTEMS:  GENERAL:  Significant for history of sweats, but none recently.  HEAD:  Significant for chronic occipital to frontal headaches which occur daily which sound like tension headaches for which he has taken [employer].  EYES:  Significant for correction lens.  NOSE/MOUTH/THROAT: Significant for esophageal dilation.  EARS:  Significant for decreased hearing related to previous auditory trauma from loud noises.  LUNGS:  History of asbestosis and asthma.  CARDIOVASCULAR:  Coronary artery disease, status post CABG and  stents.  GI:  History of gastroesophageal reflux disease. GU:  He has some urgency.  MUSCULOSKELETAL:  See HPI.   NEUROLOGIC:  No history of seizure or stroke.  See HPI.  HEMATOLOGIC:  Negative.  ENDOCRINE:  See active medical problems.  PSYCHIATRIC:  Positive for depression.  ALLERGY/IMMUNOLOGIC: Negative.  PHYSICAL EXAMINATION:  VITAL SIGNS:  Blood pressure 132/42, heart rate 54, respiratory rate 18, and O2 saturation 98%.  Pain level 7/10.  GENERAL:  This is a pleasant male in no acute distress. HEAD:  Normocephalic, atraumatic.  EYES:  Extraocular movements intact with conjunctivae and sclerae clear.  NOSE:  Patent nares.  OROPHARYNX:  Demonstrated dental plates with no mucosal lesions.  NECK:  Demonstrated slight tilt to right with fullness of left supraclavicular fossa relative to the right. I had difficulty ascertaining whether there were any lymph nodes.  Carotids were 2+ and symmetric without bruits.  LUNGS:  Demonstrated scattered rhonchi and wheezes.  HEART:  Regular rate and rhythm.  ABDOMEN:  Bowel sounds present.  GENITALIA/RECTAL:  Not performed.  BACK:  Increased pain on extension of his back to 20 degrees with forward flexion reducing his discomfort.  Straight leg raise signs were negative.  EXTREMITIES:  The patient demonstrated evidence of previous surgery over his knees.  He had crepitus on range of motion of his right shoulder.  Left shoulder demonstrated intact range of motion.  Elbows and wrists were intact. Radial pulses were 2+ and symmetric.  He had demonstrated some bony enlargement of the PIPs, DIPs, and first carpometacarpal of the hands as well as first MTPs of the feet and metatarsal joints.  Dorsalis pedis pulses were 2+ and symmetric.  NEUROlOGIC:  The patient was oriented to person, place, time, and reason for visit.  Deep tendon reflexes were symmetric in the upper extremities and lower  extremities with downgoing toes.  Motor was 5/5  with symmetric bulk and tone. Coordination was grossly intact.  Sensory exam revealed attenuated vibratory sense and pinprick in a stocking distribution in the lower extremities bilaterally to the knees.  OUTSIDE LABORATORY DATA:  MRI from [facility] in October 1999 which showed broad-based disk bulge at L3/L4 with moderate central canal stenosis with left paramedian herniated disk at L5/S1 and disk degeneration at L4/L5, as well as some retrolisthesis at L2/L3.  IMPRESSION:  1. Low back pain syndrome with radiation predominantly out into the left     lower extremity on the basis of lumbar spondylosis characterized by     degenerative disc disease and facet joint arthropathy with spinal stenosis     and history of herniated disk at L5/S1 resulting in a radiculopathy.  2. Probable element of diabetic peripheral neuropathy.  3. Other medical problems per Dr. Cathlean Cower which include diabetes, coronary     artery disease, gastroesophageal reflux disease, hypertension,      asbestosis/asthma.  4. Left supraclavicular fossa fullness.  I advised him he needed to discuss  this with Dr. Jenny Reichmann to make sure that there is no structural reasons for     the swelling such as lymphadenopathy, especially with his previous history     of cigarette smoking an asbestosis.  DISPOSITION:  1. I discussed with the patient treatment of his chronic  pain.  I advised     him that repeat epidural steroid injection would probably be of minimal     benefit since he did not respond to these in the past.  I advised him he     might respond to facet joint injections, but for the time being he has     done so well with his current use of opiates on a time contingent basis,     that I felt like adjusting these would be merited.  Neurontin and     amitriptyline have not been helpful.  I advised him I would like to     increase his OxyContin to 40 mg 1 p.o. q.8h., #90, with no refill so that     he would start  to take his Vicodin on an as needed basis rather than     using it as a maintenance medication.  I advised him of the difference in     maintenance versus breakthrough medications.  I gave him prescriptions     for Vicodin 5/500 1-2 p.o. q.6h. p.r.n., #100 with no refill.  He should     not be taking this more  than once or twice a week is what I advised him.  2. Followup with me in eight weeks.  3. He has gone ahead an signed a opiates agreement with [place]. Dictated by:   [name], M.D. Attending Physician:  [name] DD:  11/28/00 TD:  11/30/00 Job: [id number]

## 2010-08-24 NOTE — Discharge Summary (Signed)
NAME:  [name], [name] NO.:  [id number]   MEDICAL RECORD NO.:  [id number]          PATIENT TYPE:  IPS   LOCATION:  0500                          FACILITY:  [facility]   PHYSICIAN:  Rulon Eisenmenger, M.D. DATE OF BIRTH:  February 19, 1941   DATE OF ADMISSION:  01/02/2004  DATE OF DISCHARGE:  01/04/2004                                 DISCHARGE SUMMARY   IDENTIFYING DATA:  This is a 70 year old white male who is married.  This is  a voluntary admission.   HISTORY OF PRESENT ILLNESS:  The patient had presented originally in the  [facility] requesting detox from opiates.  He had been  using too much of his OxyContin to manage his chronic pain, reporting that  he had been prescribed 80 mg of OxyContin t.i.d. and he had been using it up  to four times a day and then found himself last week taking tablets of his  friend's methadone on top of that.  He endorses a history of panic attacks,  chronic depression with some anxiety, but he denies any suicidal or  homicidal thoughts.  He denies any auditory or visual hallucinations.  He  also admits to having some Vicodin available, which was prescribed for him  q.6 h. p.r.n. for breakthrough pain and also overusing these.  We did  contact the patient's primary care physician, Dr. Cathlean Cower at [facility], who did agree with the plan to put him on a Duragesic patch and  attempt the detox simultaneously, try a patch for pain control and then  attempt to address his depression symptoms.   PAST PSYCHIATRIC HISTORY:  He had a past psychiatric history remarkable for  this being his first admission for detox at [facility].  He had one prior detox  admission in the past from opiates, but he reports that he went back on the  opiates because the pain was too great to tolerate without them and he  restarted the opiates within several weeks of detoxing.  He denies any prior  suicidal thought or attempt.   SOCIAL HISTORY:  The  patient has been married for 14 years with a supportive  wife.  Has five children.  Currently lives in [place] with his wife.  Children are grown.  No legal problems.  He is currently retired.   FAMILY HISTORY:  Unremarkable.   ALCOHOL AND DRUG HISTORY:  The patient endorses some history of some opiate  abuse in the past, usually overtaking a prescription med ending up taking  more than was prescribed.   MEDICAL HISTORY:  The patient's primary care physician is Dr. Cathlean Cower and  also Dr. Dianna Limbo at the [facility].  He obtains his medications  at [facility] at the corner of [address].  Medical problems include diabetes mellitus type 2, coronary artery disease.  He is status post bypass graft.  He has a history of chronic back pain.  Also two prior knee replacements, two myocardial infarcts and two different  quadruple-vessel bypass procedures.  He also has a history of hypertension.  CURRENT MEDICATIONS:  1.  OxyContin 800 mg p.o. t.i.d.  2.  Vicodin 5/500 one q.6 h. p.r.n. for pain.  3.  Metoprolol 25 mg daily.  4.  Plavix 75 mg daily.  5.  Vytorin 10/40 one tab daily.  6.  Nitroglycerin 0.4 mg p.r.n. for emergencies.  7.  Isosorbide 30 mg sustained release.  8.  ASA 81 mg daily.  9.  Q-Var inhaler 40 mcg one puff two to three times daily.  10. Glipizide 10 mg one tablet q. day.  11. Tri-Chlor 48 mg two tabs at h.s.  12. Lopressor 0.5 mg tab q.12 h.  13. The patient reports that he had been taking alprazolam 0.5 mg p.o. as      needed in a pattern that appeared to be about twice a day.   ALLERGIES:  Nu-Bain.   POSITIVE PHYSICAL EXAMINATION:  The patient had been noted in the emergency  room to be having some clonic and tonic muscular jerking and had been  treated with Ativan successfully in the emergency room.  Full physical exam  was done in the emergency room and is otherwise unremarkable.  He did  respond well.   ADMISSION  MENTAL STATUS EXAM:  Revealed a large built white male with a  drowsy affect, still having some clonic muscle jerking in his lower  extremities, primarily with his legs jerking.  Affect was blunted.  Affect  anxious.  He was scoring his chronic pain in his lower back 8/10.  Speech  was normal and paced tone; decreased in amount.  Mood depressed, withdrawn,  mostly feeling shame and guilty about overusing his medications; afraid to  confront the issue with his physicians.  Thought process was logical,  coherent.  No evidence of psychosis.  Some vague, passive suicidal ideation  stating that he just cannot tolerate being on the medications anymore;  feeling that he needs to take more and more; wants to be detoxed off of it  because he feels that he is a slave to the medication.  No homicidal  thought.  No psychosis.  Cognitively he is intact and oriented times three.  Insight, judgment and impulse control all within normal limits and  satisfactory.   ADMITTING DIAGNOSES:   AXIS I:  1.  Opiate abuse and dependence.  2.  Depressive disorder, not otherwise specified.   AXIS II:  Deferred.   AXIS III:  1.  Chronic back pain with radiation down the left leg.  2.  Coronary artery disease.  3.  Hypertension.  4.  Status post coronary artery bypass grafting.   AXIS IV:  Deferred.   AXIS V:  Current 28; past year 69.   COURSE OF HOSPITALIZATION:  The patient was admitted to detox him off of his  opiates after he clearly expressed a desire to do so.  He planned on going  back to his primary care physician and pursuing other avenues of pain  management.  We discussed some here, including possibly the use of  Neurontin.  We, with his consent, started him on a clonidine protocol and  because of his pain level also started him on a Fentanyl patch 50 mcg q.72  h. to help manage what was a significant pain and started him on Neurontin 300 mg today and 300 mg p.o. b.i.d. which he initially  tolerated fairly  well.  However, in that first night that he was admitted he began to  experience some bradycardia with his pulse decreasing down  to 39 beats per  minute.  He remained conscious the whole time, but was somewhat dizzy.  So,  he was therefore transferred to the emergency room at [facility] to be  evaluated by cardiology who chose to admit him at that time for telemetry  monitoring.  He was transferred to the emergency room and discharged.   MEDICATIONS AT THE TIME OF TRANSFER:  1.  Plavix 75 mg daily.  2.  ASA 81 mg daily.  3.  Fentanyl patch 50 mcg q.72 h.  4.  We were in the middle of the clonidine protocol and he was receiving      clonidine 0.1 mg t.i.d. per the Behavioral Health protocol.  5.  Neurontin 300 mg q.a.m. and q.h.s.  6.  Metoprolol 25 mg daily.  7.  Vytorin 10/40 mg daily.  8.  Nitroglycerin 0.4 mg sublingual q.5 minutes p.r.n. times three for chest      pain.  9.  Isosorbide sustained release 30 mg daily.  10. Q-Var inhaler 40 mcg.  11. Glucotrol 10 mg p.o. q.a.m.   MENTAL STATUS EXAM AT THE TIME OF TRANSFER:  He was fully alert, in no acute  distress, calm and cooperative with no acute chest pain.  He still admitted  to having continued chronic pain and feeling somewhat hopeless in mood, but  no acute suicidal thoughts.   DISCHARGE DIAGNOSES:   AXIS I:  1.  Depressive disorder, not otherwise specified.  2.  Opiate abuse and dependence.   AXIS II:  Deferred.   AXIS III:  1.  Chronic back pain.  2.  Coronary artery disease.  3.  Hypertension.   AXIS IV:  Deferred.   AXIS V:  Current 38; past year 10 to 87.     Marg   MAS/MEDQ  D:  01/31/2004  T:  01/31/2004  Job:  [id number]

## 2010-08-24 NOTE — Group Therapy Note (Signed)
DATE OF BIRTH:  May 24, 1940.   MEDICAL RECORD NUMBER:  [id number].   PHYSICAL MEDICINE REHABILITATION/PAIN MANAGEMENT INITIAL EVALUATION:  A 70-  year-old male who notes a history of back pain dating back at least 10  years, who has noted increasing back pain over the last year.  He was  evaluated at this clinic by a nonpractice-affiliated physician, Arlina Robes,  D.O., in February of 2004.  There were several recommendations made for  spinal injections which the patient did not wish to pursue.  A trial of TENS  and physical therapy was also recommended, which the patient did not want to  pursue.  In the intervening time, the patient has undergone neurosurgical  consultation on February 03, 2003, per Leeroy Cha, M.D.  Spine films at  that time showed degenerative disk disease at L3-4, L4-5 and L5-S1 with  stepoff between L3-4 and L4-5 between flexion and extension and also facet  arthropathy.  A repeat MRI was not done, but one was suggested.  The last  imaging study that I have seen was in fact was on July 07, 1996, showing  mild to moderate spinal stenosis central at L2-3, mild to moderate stenosis  at L3-4, possible migrated disk __________ the right L4 recess.  In the L4-5  bulging spore formation at the left L4 nerve root.  MRI of the spine on  January 06, 1998, showed broad based disk bulge at L3-4, moderate central  stenosis, large disk present at L4-5, no significant stenosis and paramedian  HNP at L5-S1.  This was ordered by Dr. Wilford Grist at the [facility].  X-rays done at [facility] on October 04, 1993, showed severe  facet degenerative changes in lower lumbar spine, some retrolisthesis at L4-  5.   He has not undergone physical therapy for quite some time.  As noted, he has  been treated with OxyContin.  As of February of 2004, he was on 80 mg b.i.d.  In the last eight months, he has gone to 80 mg t.i.d.  In addition, he takes  Vicodin three to  four times a day at a 5 mg dosage.  He has been treated for  anxiety and depression using Celexa in the past, but it is not on his  current medication list.  He was trialed on Cymbalta earlier this year, but  discontinued.   More recently, he has undergone repeat cardiac catheterization on December 01, 2003, and he had coronary artery disease noted, however, no occlusion of the  previously placed stents.  The patient was informed that the cause of chest  pain was not determined from his cardiac catheterization results.  Incidentally, aortic angiogram showed mild infrarenal atherosclerotic  changes, no aneurysm and normal iliac bifurcation of proximal iliacs and  hypogastrics.  The EF was at 55%.  The left main coronary was normal.  LAD  without significant stenosis.  The RCA was occluded.  There were notes from  cardiology that the left distal SFA was stable and the left lower extremity  claudication symptoms as well.  Overall suggestions to look for  musculoskeletal or GI sources of chest pain.   CURRENT MEDICATIONS:  1.  Plavix 75 mg daily.  2.  Glucotrol XL 5 mg p.o. daily.  3.  Metoprolol 25 mg p.o. daily.  4.  Prilosec 20 mg p.o. daily.  5.  Vytorin 10/40 mg one p.o. daily.  6.  Tricor 67 mg two p.o. daily.  7.  Alprazolam 0.25 mg t.i.d.  8.  Hydrocodone.  Takes four a day 5/500 mg.  9.  OxyContin 80 mg t.i.d.  10. Enteric-coated aspirin one p.o. daily.  11. Multivitamins daily.   ALLERGIES:  NUBAIN.   OTHER MEDICATIONS TRIED IN PAST:  He notes that Ultram did not help and  Neurontin was not helpful either.   OTHER THERAPIES TRIED:  Injections x 4 were done at the [facility].  However, only one of them produced relief and this was for about  two to three days.  He does not remember what type of injections these were.   PAIN EXACERBATING FACTORS:  Walking, bending, sitting, working, therapy or  any type of movement.   FUNCTIONAL STATUS:  Independent with all  of his activities of daily living,  although his wife does help him somewhat with his socks.  He last mowed his  lawn using a riding mower approximately one month ago.   OTHER PAST MEDICAL HISTORY:  Significant for diabetes and high blood  pressure.   SOCIAL HISTORY:  Smokes a pack a day x 35 years.  Lives with his wife.  Married.  Disabled since 1999 with his back pain.   REVIEW OF SYSTEMS:  Positive for chest pain, shortness of breath and  wheezing.  GASTROINTESTINAL:  Positive for reflux, heartburn and  constipation.  Frequent urination.  No urinary or bowel incontinence.   Complains of weakness of the legs, numbness in the legs, some dizziness,  confusion, blurred vision, anxiety, depression and poor sleep, but denies  suicidal thoughts.  Some headaches.  Has had fever, chills, sweating and  high blood sugars.   PHYSICAL EXAMINATION:  Blood pressure 134/83, pulse 47, respirations 18, O2  saturation 97% on room air.   The back has mild to moderate tenderness to palpation at the lumbosacral  junction and bilaterally in the paraspinals.  His forward flexion is about  50%, extension is 25%.  Extension hurts more than flexion.  He has no  evidence of intrinsic atrophy in the hands or feet.  He has no  stocking/glove distribution reduction of sensation to light touch.  His  motor strength is 4+/5 strength in bilateral deltoids, biceps, triceps,  grip, hip flexion, knee extension and ankle dorsiflexion.  His base of  support is somewhat widened.   IMPRESSION:  1.  History of lumbar facet arthropathy.  This goes along with his imaging      studies, as well as physical exam.  However, I think updated studies are      in order.  2.  History of claudication symptoms in the leg.  Appears to be vascular,      although could be neurogenic as well.  For this reason, would like to      check CT of his lumbar spine given that he cannot have MRI due to his     cardiac stents.  3.  He is on  quite a hefty dosage of OxyContin and even this is not really      controlling his pain adequately.  He may have developed a tolerance to      this medication.  Also, he would personally like to get off this      medicine.  He feels like it makes him swimmy headed and foggy.  He has      asked at [facility] whether they would detoxify him, however, he is      not abusing street  drugs and they reportedly refused him.  There is some      concern about his cardiac status as well.  4.  Possible left L4 radiculopathy versus neurogenic versus vascular      claudication.  Incidentally noted, he does have decreased pedal pulses      bilaterally, but no skin discoloration of the feet.   PLAN:  1.  Discussed with the patient that I think the first step is to reduce his      OxyContin dose.  He really did not get any excess benefit from going      from two to three tablets per day.  After he gets down to at least a 40      mg b.i.d. dosage, will switch him to an extended release morphine      preparation, such as Avinza 90 mg dosage.  Will also then work on      weaning him off of the hydrocodone.  2.  In terms of some of his anxiety and depression symptoms, he may do      better with a medication such as Lexapro rather his Xanax.  3.  Will follow up on CT scan results.  Possibly try facet medial branch      blocks depending on anatomy of facets.  4.  I will see him back in approximately two weeks.  5.  Have written for a weaning dose of his OxyContin starting at 40 mg two      tablets twice a day x 3 days and then go to one tablet t.i.d. x 3 days      and then one tablet b.i.d. thereafter until I see him.  Then will likely      switch him to his Avinza.      AEK/MedQ  D:  12/27/2003 13:43:26  T:  12/28/2003 12:57:04  Job #:  [id number]   cc:   Biagio Borg, M.D. [facility]

## 2010-08-24 NOTE — Assessment & Plan Note (Signed)
MEDICAL RECORD NUMBER:  [id number]   HISTORY:  A 70 year old male with greater than a 10-year history of low back  pain dating back at least 10 years.  The patient in the interval time has  had a CT scan of the lumbar spine showing severe stenosis at L4-5, and  moderate stenosis at L3-4 to L2-3 with some facet arthropathy at all levels.  He has gone through detox at his request.  He is off OxyContin 80 t.i.d. and  now is down to just 6 Vicodin a day.  His pain score currently is a 9 out of  10 low back and left lower extremity.  He had some problems with bradycardia  while he was going through detox and this returned to baseline after some  hospitalization at [facility].  Overall hospitalizations were between [facility] visits, [facility] and [facility] were between  September 26 and January 07, 2004.  He received some Duragesic while  inpatient but last patch was taken off on October 1 and he was discharged  home on Vicodin.   CURRENT MEDICATIONS:  1.  Multivitamin 1 daily.  2.  Enteric coated aspirin 81 mg daily.  3.  Prilosec.  4.  Theophylline.  5.  Plavix.  6.  Glipizide.  7.  Gabapentin 300 b.i.d.  8.  Alprazolam 0.25 t.i.d.  9.  Hydrocodone 5/325 x6 daily.  10. Nitroglycerin 0.4 mg p.r.n.  11. Isosorbide dinitrate.  12. Ibuprofen 200 mg, takes about 1 daily.   Pain is made worse with any activity; better with medication.   SOCIAL HISTORY:  Accompanied by his wife.  Smokes a pack a day.  Disabled  since 1996 with his back and heart.   REVIEW OF SYSTEMS:  Positive for weakness, numbness, dizziness, poor sleep,  anxiety.  Had some panic attacks.  Frequent urination, reflux, heart burn,  constipation.  Fevers, sweating, high blood sugars.   PAST MEDICAL HISTORY:  Significant for diabetes as noted.   PHYSICAL EXAMINATION:  VITAL SIGNS:  Pulse 60, blood pressure 102/56,  respiratory rate is 18, O2 saturation 98% on room air.  GENERAL:  In no acute  distress.  Mood and affect appropriate.  BACK:  No tenderness to palpation.  He has forward flexion about 50%,  extension of 50%.  He states that forward flexion hurts more than extension.   Motor strength is 4/5 bilateral hip flexor, knee extensor, ankle  dorsiflexion, sensation intact bilateral lower extremities.  Deep tendon  reflexes are normal bilateral lower extremities.  Antalgic gait, favors left  leg.   Base support somewhat widened.   IMPRESSION:  1.  Lumbar spinal stenosis.  May have some spinal lithiasis in he does      better with extension than flexion and he has some minor claudication      symptoms.  Overall his pain level is a little bit more intense than it      was but really just went from an 8 to a 9 and is off the walking dosages      of Oxycodone that he was on prior to detox.  2.  Likely facet arthropathy contributing to above.  3.  Lower extremity weakness.  I think he is deconditioned and would benefit      from leg strengthening though physical therapy.  4.  Positive urine drug screen for methadone which does not cross react with      Hydrocodone and Hydrocodone is the only narcotic  analgesic he is      currently on.  He does have benzodiazepines positive as expected.      Opiates positive as expected.  I questioned him at length whether he has      had methadone.  He states that he received some from a friend about a      month ago but has not had any since.  But of note, he has had positive      methadone not only during this visit but during my initial visit on      urine drug screen on December 27, 2003.  As I explained to the patient,      I cannot prescribe any narcotic analgesics as long as he has positive      methadone.  5.  Will start some physical therapy.  Will schedule for L4 transforaminal      epidural steroid injection on the left.  6.  Ultram.  7.  Increased Neurontin.       AEK/MedQ  D:  01/12/2004 16:43:20  T:  01/13/2004 10:30:46   Job #:  [id number]   cc:   Leslye Peer, MD  Fax: [phone]   Bryson Dames, M.D.  [phone] [address], [address] [place] [address]  Fax: [phone]   Biagio Borg, M.D. [facility]   Leeroy Cha, M.D.  [address]  [place], [place] [address]  Fax: [phone]

## 2010-08-27 ENCOUNTER — Telehealth: Payer: Self-pay | Admitting: *Deleted

## 2010-08-27 NOTE — Telephone Encounter (Signed)
No answer at home number, no answering machine so no message left, ewm, rn

## 2010-09-09 ENCOUNTER — Other Ambulatory Visit: Payer: Self-pay | Admitting: Internal Medicine

## 2010-09-25 ENCOUNTER — Other Ambulatory Visit: Payer: Self-pay | Admitting: Internal Medicine

## 2010-10-08 ENCOUNTER — Other Ambulatory Visit: Payer: Self-pay | Admitting: Internal Medicine

## 2010-10-08 NOTE — Telephone Encounter (Signed)
Rx Done . 

## 2010-10-16 ENCOUNTER — Encounter: Payer: Self-pay | Admitting: Cardiology

## 2010-11-13 ENCOUNTER — Other Ambulatory Visit: Payer: Self-pay | Admitting: Anesthesiology

## 2010-11-13 DIAGNOSIS — M545 Low back pain, unspecified: Secondary | ICD-10-CM

## 2010-11-16 ENCOUNTER — Ambulatory Visit (INDEPENDENT_AMBULATORY_CARE_PROVIDER_SITE_OTHER): Payer: Medicare Other | Admitting: Internal Medicine

## 2010-11-16 ENCOUNTER — Encounter: Payer: Self-pay | Admitting: Internal Medicine

## 2010-11-16 VITALS — BP 100/70 | HR 79 | Temp 98.3°F | Ht 69.0 in | Wt 191.1 lb

## 2010-11-16 DIAGNOSIS — F329 Major depressive disorder, single episode, unspecified: Secondary | ICD-10-CM

## 2010-11-16 DIAGNOSIS — J310 Chronic rhinitis: Secondary | ICD-10-CM

## 2010-11-16 DIAGNOSIS — R413 Other amnesia: Secondary | ICD-10-CM

## 2010-11-16 DIAGNOSIS — I10 Essential (primary) hypertension: Secondary | ICD-10-CM

## 2010-11-16 MED ORDER — DONEPEZIL HCL 5 MG PO TABS: 5.0000 mg | ORAL_TABLET | Freq: Every day | ORAL | Status: DC

## 2010-11-16 MED ORDER — FLUTICASONE PROPIONATE 50 MCG/ACT NA SUSP: 2.0000 | Freq: Every day | NASAL | Status: DC

## 2010-11-16 MED ORDER — DULOXETINE HCL 60 MG PO CPEP: 60.0000 mg | ORAL_CAPSULE | Freq: Every day | ORAL | Status: DC

## 2010-11-16 MED ORDER — DULOXETINE HCL 30 MG PO CPEP: ORAL_CAPSULE | ORAL | Status: DC

## 2010-11-16 NOTE — Patient Instructions (Addendum)
If the cymbalta is not too expensive, pleaes start at 30 mg per wk for 1 wk, then 60 mg per day after that If you start the cymbalta, it is then OK to wean off the citalopram by taking HALF pill for one month, then stop (Ok to take the cymbalta and the citalopram at the same time) You are given the ventolin HFA sample (same thing as the proair hfa) Take all new medications as prescribed - the generic for flonase for the nose, and the aricept 5 mg generic (for memory) You will be contacted regarding the referral for: Head MRI (for [place], near [place]) Please keep your appointments with your specialists as you have planned

## 2010-11-16 NOTE — Progress Notes (Signed)
Subjective:    Patient ID: [name], male    DOB: 1940/10/09, 70 y.o.   MRN: [id number]  HPI  Here with wife, who with pt c/o gradually worsening ST memory dysfunction in the past 6 mo.  Does have several wks ongoing nasal allergy symptoms with clear congestion, itch and sneeze, without fever, pain, ST, cough or wheezing.  Also with worsening depressive symptoms, without suicidal ideation, or panic, though has ongoing anxiety.  Still seeing pain clinic for chronic LBP, felt nonsurgical candidate due to heart disease.  Does have sense of ongoing fatigue, but denies signficant hypersomnolence.  Feels shaky and off balance with walking Past Medical History  Diagnosis Date  . Angina at rest   . COPD (chronic obstructive pulmonary disease)   . Allergy   . Myocardial infarction   . Diabetes mellitus   . Emphysema of lung   . GERD (gastroesophageal reflux disease)   . Hypertension   . Hyperlipidemia   . Weight loss   . CAD (coronary artery disease)   . Anxiety   . PVD (peripheral vascular disease)   . Contusion     left chest wall  . Dysphagia   . Abdominal pain     generalized  . OA (osteoarthritis)   . Asthma   . Anemia     iron deficiency  . Memory loss   . Insomnia   . DDD (degenerative disc disease), lumbar   . OSA (obstructive sleep apnea)   . Hypersomnia   . Fatigue   . Abdominal  pain, other specified site   . Urinary hesitancy   . Low back pain   . Depression   . Benign prostatic hypertrophy   . Lumbar back pain     chronic  . Spinal stenosis   . Herniated disc   . History of colonic polyps   . Diverticulitis, colon   . Esophageal stricture   . Hx of colonoscopy   . Chronic obstructive pulmonary disease (COPD)   . Rhinitis 11/17/2010   Past Surgical History  Procedure Date  . Heart bypass   . Bilateral knee replacements   . Hemorrhoid surgery   . Carpal tunnel release   . Shoulder arthroscopy right  . Coronary angioplasty with stent placement     reports that he has been smoking Cigarettes.  He has a 40 pack-year smoking history. He does not have any smokeless tobacco history on file. He reports that he does not drink alcohol or use illicit drugs. family history includes Allergies in an unspecified family member; Diabetes in an unspecified family member; Emphysema in his father; Heart disease in an unspecified family member; Hyperlipidemia in an unspecified family member; Hypertension in an unspecified family member; Kidney cancer in his daughter; and Stroke in his father.  There is no history of Colon cancer. Allergies  Allergen Reactions  . Celecoxib     REACTION: GI upset  . Morphine     REACTION: itching  . Nalbuphine     REACTION: contraindication with oxycontin.  . Prednisone     REACTION: "yeast infections"  . Venlafaxine     REACTION: GI upset   Current Outpatient Prescriptions on File Prior to Visit  Medication Sig Dispense Refill  . ALPRAZolam (XANAX) 0.25 MG tablet take 1 tablet by mouth three times a day if needed  90 tablet  5  . Aspirin (ADULT ASPIRIN LOW STRENGTH) 81 MG EC tablet Take 81 mg by mouth daily.        Marland Kitchen  budesonide-formoterol (SYMBICORT) 160-4.5 MCG/ACT inhaler Inhale 2 puffs into the lungs 2 (two) times daily.        . citalopram (CELEXA) 40 MG tablet take 1 tablet by mouth once daily  30 tablet  9  . ferrous sulfate 325 (65 FE) MG EC tablet Take 325 mg by mouth 3 (three) times daily with meals.        . gabapentin (NEURONTIN) 300 MG capsule       . GLIPIZIDE XL 10 MG 24 hr tablet       . HYDROcodone-acetaminophen (LORCET) 10-650 MG per tablet       . lisinopril (PRINIVIL,ZESTRIL) 20 MG tablet       . metFORMIN (GLUCOPHAGE) 500 MG tablet       . Multiple Vitamin (MULTIVITAMIN) capsule Take 1 capsule by mouth daily.        . nitroGLYCERIN (NITROSTAT) 0.4 MG SL tablet Place 0.4 mg under the tongue every 5 (five) minutes as needed.        Marland Kitchen omeprazole (PRILOSEC) 20 MG capsule Take 20 mg by mouth daily.         Marland Kitchen oxyCODONE (OXYCONTIN) 80 MG 12 hr tablet Take 80 mg by mouth every 12 (twelve) hours.        . pravastatin (PRAVACHOL) 80 MG tablet       . PROAIR HFA 108 (90 BASE) MCG/ACT inhaler inhale 2 puffs by mouth four times a day if needed  8.5 g  4  . tiZANidine (ZANAFLEX) 4 MG tablet take 1 to 2 tablets by mouth every 6 hours if needed  120 tablet  2  . zolpidem (AMBIEN CR) 12.5 MG CR tablet Take 12.5 mg by mouth at bedtime as needed.        Marland Kitchen dexlansoprazole (DEXILANT) 60 MG capsule Take 60 mg by mouth daily.         Current Facility-Administered Medications on File Prior to Visit  Medication Dose Route Frequency Provider Last Rate Last Dose  . 0.9 %  sodium chloride infusion  500 mL Intravenous Continuous Scarlette Shorts, MD       Review of Systems Review of Systems  Constitutional: Negative for diaphoresis and unexpected weight change.  HENT: Negative for drooling and tinnitus.   Eyes: Negative for photophobia and visual disturbance.  Respiratory: Negative for choking and stridor.   Gastrointestinal: Negative for vomiting and blood in stool.        Objective:   Physical Exam BP 100/70  Pulse 79  Temp(Src) 98.3 F (36.8 C) (Oral)  Ht 5\' 9"  (1.753 m)  Wt 191 lb 2 oz (86.694 kg)  BMI 28.22 kg/m2  SpO2 90% Physical Exam  VS noted Constitutional: Pt appears well-developed and well-nourished.  HENT: Head: Normocephalic.  Right Ear: External ear normal.  Left Ear: External ear normal.  Eyes: Conjunctivae and EOM are normal. Pupils are equal, round, and reactive to light.  Neck: Normal range of motion. Neck supple.  Cardiovascular: Normal rate and regular rhythm.   Pulmonary/Chest: Effort normal and breath sounds decreased bilat.  Abd:  Soft, NT, non-distended, + BS Neurological: Pt is alert. No cranial nerve deficit.  Skin: Skin is warm. No erythema.  Psychiatric: Pt behavior is normal. Thought content c/w mild ST memory dysfunction, depressed affect      Assessment & Plan:

## 2010-11-17 ENCOUNTER — Encounter: Payer: Self-pay | Admitting: Internal Medicine

## 2010-11-17 DIAGNOSIS — J449 Chronic obstructive pulmonary disease, unspecified: Secondary | ICD-10-CM | POA: Insufficient documentation

## 2010-11-17 DIAGNOSIS — Z0000 Encounter for general adult medical examination without abnormal findings: Secondary | ICD-10-CM | POA: Insufficient documentation

## 2010-11-17 DIAGNOSIS — J310 Chronic rhinitis: Secondary | ICD-10-CM | POA: Insufficient documentation

## 2010-11-17 NOTE — Assessment & Plan Note (Signed)
Mild to mod, for flonase asd,  to f/u any worsening symptoms or concerns

## 2010-11-17 NOTE — Assessment & Plan Note (Signed)
For cymbalta trial, may help with pain as well,   to f/u any worsening symptoms or concerns

## 2010-11-17 NOTE — Assessment & Plan Note (Signed)
Possible early dementia - for MRI head - r/o other cause,start aricept 5 qd,  to f/u any worsening symptoms or concerns

## 2010-11-17 NOTE — Assessment & Plan Note (Signed)
stable overall by hx and exam, most recent data reviewed with pt, and pt to continue medical treatment as before  BP Readings from Last 3 Encounters:  11/16/10 100/70  08/24/10 97/51  06/12/10 124/58

## 2010-11-22 ENCOUNTER — Encounter: Payer: Self-pay | Admitting: Cardiology

## 2010-11-22 ENCOUNTER — Ambulatory Visit (INDEPENDENT_AMBULATORY_CARE_PROVIDER_SITE_OTHER): Payer: Medicare Other | Admitting: Cardiology

## 2010-11-22 VITALS — BP 101/65 | HR 75 | Ht 69.0 in | Wt 189.8 lb

## 2010-11-22 DIAGNOSIS — I209 Angina pectoris, unspecified: Secondary | ICD-10-CM

## 2010-11-22 DIAGNOSIS — I2510 Atherosclerotic heart disease of native coronary artery without angina pectoris: Secondary | ICD-10-CM

## 2010-11-22 DIAGNOSIS — I739 Peripheral vascular disease, unspecified: Secondary | ICD-10-CM

## 2010-11-22 DIAGNOSIS — F17200 Nicotine dependence, unspecified, uncomplicated: Secondary | ICD-10-CM

## 2010-11-22 MED ORDER — NITROGLYCERIN 0.4 MG SL SUBL: 0.4000 mg | SUBLINGUAL_TABLET | SUBLINGUAL | Status: DC | PRN

## 2010-11-22 NOTE — Assessment & Plan Note (Signed)
Advised to quit. I am certain that he won't

## 2010-11-22 NOTE — Assessment & Plan Note (Signed)
Stable. He must have 3 vessel disease with good collaterals. Nitroglycerin renewed.

## 2010-11-22 NOTE — Patient Instructions (Signed)
Your physician recommends that you schedule a follow-up appointment in: one year  

## 2010-11-22 NOTE — Progress Notes (Signed)
HPI Mr. Bordonaro returns today or further evaluation and management of his coronary artery disease, peripheral vascular disease, and chronic angina.  He continues to amaze on how well he does considering how poorly he takes care of himself. He continues to smoke very heavily but seems to be compliant with his medications. He has a lot of chronic aches and pains and is on a lot of pain medicine. Had one episode of angina last week required 2 nitroglycerin tablets.  EKG today shows sinus bradycardia with an incomplete right bundle, no acute changes. Past Medical History  Diagnosis Date  . Angina at rest   . COPD (chronic obstructive pulmonary disease)   . Allergy   . Myocardial infarction   . Diabetes mellitus   . Emphysema of lung   . GERD (gastroesophageal reflux disease)   . Hypertension   . Hyperlipidemia   . Weight loss   . CAD (coronary artery disease)   . Anxiety   . PVD (peripheral vascular disease)   . Contusion     left chest Kace Hartje  . Dysphagia   . Abdominal pain     generalized  . OA (osteoarthritis)   . Asthma   . Anemia     iron deficiency  . Memory loss   . Insomnia   . DDD (degenerative disc disease), lumbar   . OSA (obstructive sleep apnea)   . Hypersomnia   . Fatigue   . Abdominal  pain, other specified site   . Urinary hesitancy   . Low back pain   . Depression   . Benign prostatic hypertrophy   . Lumbar back pain     chronic  . Spinal stenosis   . Herniated disc   . History of colonic polyps   . Diverticulitis, colon   . Esophageal stricture   . Hx of colonoscopy   . Chronic obstructive pulmonary disease (COPD)   . Rhinitis 11/17/2010    Past Surgical History  Procedure Date  . Heart bypass   . Bilateral knee replacements   . Hemorrhoid surgery   . Carpal tunnel release   . Shoulder arthroscopy right  . Coronary angioplasty with stent placement     Family History  Problem Relation Age of Onset  . Stroke Father   . Heart disease    .  Hypertension    . Hyperlipidemia    . Kidney cancer Daughter   . Emphysema Father   . Allergies    . Colon cancer Neg Hx   . Diabetes      History   Social History  . Marital Status: Married    Spouse Name: N/A    Number of Children: 50  . Years of Education: N/A   Occupational History  . Retired-construction    Social History Main Topics  . Smoking status: Current Everyday Smoker -- 1.0 packs/day for 40 years    Types: Cigarettes  . Smokeless tobacco: Not on file  . Alcohol Use: No  . Drug Use: No  . Sexually Active: Not on file   Other Topics Concern  . Not on file   Social History Narrative  . No narrative on file    Allergies  Allergen Reactions  . Celecoxib     REACTION: GI upset  . Morphine     REACTION: itching  . Nalbuphine     REACTION: contraindication with oxycontin.  . Prednisone     REACTION: "yeast infections"  . Venlafaxine     REACTION:  GI upset    Current Outpatient Prescriptions  Medication Sig Dispense Refill  . ALPRAZolam (XANAX) 0.25 MG tablet take 1 tablet by mouth three times a day if needed  90 tablet  5  . Aspirin (ADULT ASPIRIN LOW STRENGTH) 81 MG EC tablet Take 81 mg by mouth daily.        Marland Kitchen donepezil (ARICEPT) 5 MG tablet Take 1 tablet (5 mg total) by mouth at bedtime.  30 tablet  11  . DULoxetine (CYMBALTA) 60 MG capsule Take 1 capsule (60 mg total) by mouth daily.  30 capsule  11  . ferrous sulfate 325 (65 FE) MG EC tablet Take 325 mg by mouth daily.       . fluticasone (FLONASE) 50 MCG/ACT nasal spray Place 2 sprays into the nose daily.  16 g  2  . GLIPIZIDE XL 10 MG 24 hr tablet Take 10 mg by mouth 2 (two) times daily.       Marland Kitchen HYDROcodone-acetaminophen (LORCET) 10-650 MG per tablet       . lisinopril (PRINIVIL,ZESTRIL) 20 MG tablet       . metFORMIN (GLUCOPHAGE) 500 MG tablet Take 500 mg by mouth 3 (three) times daily.       . Multiple Vitamin (MULTIVITAMIN) capsule Take 1 capsule by mouth daily.        . nitroGLYCERIN  (NITROSTAT) 0.4 MG SL tablet Place 0.4 mg under the tongue every 5 (five) minutes as needed.        Marland Kitchen omeprazole (PRILOSEC) 20 MG capsule Take 20 mg by mouth 2 (two) times daily.       Marland Kitchen oxyCODONE (OXYCONTIN) 80 MG 12 hr tablet Take 80 mg by mouth every 12 (twelve) hours. Taking 4 tabs twice daily      . pravastatin (PRAVACHOL) 80 MG tablet       . PROAIR HFA 108 (90 BASE) MCG/ACT inhaler inhale 2 puffs by mouth four times a day if needed  8.5 g  4  . tiZANidine (ZANAFLEX) 4 MG tablet take 1 to 2 tablets by mouth every 6 hours if needed  120 tablet  2  . zolpidem (AMBIEN CR) 12.5 MG CR tablet Take 12.5 mg by mouth at bedtime as needed.        . budesonide-formoterol (SYMBICORT) 160-4.5 MCG/ACT inhaler Inhale 2 puffs into the lungs 2 (two) times daily.        . citalopram (CELEXA) 40 MG tablet take 1 tablet by mouth once daily  30 tablet  9  . dexlansoprazole (DEXILANT) 60 MG capsule Take 60 mg by mouth daily.        Marland Kitchen gabapentin (NEURONTIN) 300 MG capsule        Current Facility-Administered Medications  Medication Dose Route Frequency Provider Last Rate Last Dose  . 0.9 %  sodium chloride infusion  500 mL Intravenous Continuous Scarlette Shorts, MD        ROS Negative other than HPI.   PE General Appearance: well developed, well nourished in no acute distress, looks older than stated age, disheveled HEENT: symmetrical face, PERRLA, poor dentition  Neck: no JVD, thyromegaly, or adenopathy, trachea midline Chest: symmetric without deformity Cardiac: PMI non-displaced, RRR, soft  S1, S2, no gallop or murmur Lung: clear to ausculation and percussion Vascular: lower extremity pulses are present but barely palpable in the left foot. He has dependent rubor with very delayed capillary refill. Abdominal: nondistended, nontender, good bowel sounds, no HSM, no bruits Extremities: no cyanosis, clubbing or edema,  no sign of DVT, no varicosities  Skin: normal color, no rashes Neuro: alert and oriented x  3, non-focal Pysch: normal affect Filed Vitals:   11/22/10 1404  BP: 101/65  Pulse: 75  Height: 5\' 9"  (1.753 m)  Weight: 189 lb 12.8 oz (86.093 kg)    EKG  Labs and Studies Reviewed.   Lab Results  Component Value Date   WBC 6.2 01/16/2010   HGB 13.4 01/16/2010   HCT 39.6 01/16/2010   MCV 99.9 01/16/2010   PLT 216.0 01/16/2010      Chemistry      Component Value Date/Time   NA 140 05/31/2010 1536   K 5.1 05/31/2010 1536   CL 101 05/31/2010 1536   CO2 31 05/31/2010 1536   BUN 16 05/31/2010 1536   CREATININE 0.7 05/31/2010 1536      Component Value Date/Time   CALCIUM 9.1 05/31/2010 1536   ALKPHOS 77 01/16/2010 1554   AST 19 01/16/2010 1554   ALT 14 01/16/2010 1554   BILITOT 0.3 01/16/2010 1554       Lab Results  Component Value Date   CHOL 167 05/31/2010   CHOL 172 01/16/2010   CHOL 178 01/10/2009   Lab Results  Component Value Date   HDL 32.60* 05/31/2010   HDL 33.10* 01/16/2010   HDL 32.80* 01/10/2009   No results found for this basename: LDLCALC   Lab Results  Component Value Date   TRIG 283.0* 05/31/2010   TRIG 262.0* 01/16/2010   TRIG 333.0* 01/10/2009   Lab Results  Component Value Date   CHOLHDL 5 05/31/2010   CHOLHDL 5 01/16/2010   CHOLHDL 5 01/10/2009   Lab Results  Component Value Date   HGBA1C 7.1* 05/31/2010   Lab Results  Component Value Date   ALT 14 01/16/2010   AST 19 01/16/2010   ALKPHOS 77 01/16/2010   BILITOT 0.3 01/16/2010   Lab Results  Component Value Date   TSH 2.14 01/16/2010

## 2010-11-22 NOTE — Assessment & Plan Note (Signed)
Stable.  No change in therapy. 

## 2010-11-24 ENCOUNTER — Ambulatory Visit
Admission: RE | Admit: 2010-11-24 | Discharge: 2010-11-24 | Disposition: A | Discharge status: Home / Self Care | Payer: Medicare Other | Source: Ambulatory Visit | Attending: Anesthesiology | Admitting: Anesthesiology

## 2010-11-24 ENCOUNTER — Ambulatory Visit
Admission: RE | Admit: 2010-11-24 | Discharge: 2010-11-24 | Disposition: A | Discharge status: Home / Self Care | Payer: Medicare Other | Source: Ambulatory Visit | Attending: Internal Medicine | Admitting: Internal Medicine

## 2010-11-24 DIAGNOSIS — R413 Other amnesia: Secondary | ICD-10-CM

## 2010-11-24 DIAGNOSIS — M545 Low back pain, unspecified: Secondary | ICD-10-CM

## 2010-12-09 ENCOUNTER — Other Ambulatory Visit: Payer: Self-pay | Admitting: Internal Medicine

## 2010-12-18 NOTE — Progress Notes (Signed)
Addended by: Doug Sou D on: 12/18/2010 04:27 PM   Modules accepted: Orders

## 2011-01-04 ENCOUNTER — Other Ambulatory Visit: Payer: Self-pay | Admitting: Internal Medicine

## 2011-01-09 ENCOUNTER — Other Ambulatory Visit: Payer: Self-pay | Admitting: Internal Medicine

## 2011-01-14 ENCOUNTER — Other Ambulatory Visit: Payer: Self-pay | Admitting: Internal Medicine

## 2011-01-15 ENCOUNTER — Other Ambulatory Visit: Payer: Self-pay | Admitting: Internal Medicine

## 2011-01-15 NOTE — Telephone Encounter (Signed)
Faxed hardcopy to [facility]

## 2011-01-15 NOTE — Telephone Encounter (Signed)
Done to robin 

## 2011-01-17 ENCOUNTER — Ambulatory Visit (INDEPENDENT_AMBULATORY_CARE_PROVIDER_SITE_OTHER)
Admission: RE | Admit: 2011-01-17 | Discharge: 2011-01-17 | Disposition: A | Discharge status: Home / Self Care | Payer: Medicare Other | Source: Ambulatory Visit | Attending: Internal Medicine | Admitting: Internal Medicine

## 2011-01-17 ENCOUNTER — Telehealth: Payer: Self-pay | Admitting: *Deleted

## 2011-01-17 ENCOUNTER — Other Ambulatory Visit (INDEPENDENT_AMBULATORY_CARE_PROVIDER_SITE_OTHER): Payer: Medicare Other

## 2011-01-17 ENCOUNTER — Encounter: Payer: Self-pay | Admitting: Internal Medicine

## 2011-01-17 ENCOUNTER — Ambulatory Visit (INDEPENDENT_AMBULATORY_CARE_PROVIDER_SITE_OTHER): Payer: Medicare Other | Admitting: Internal Medicine

## 2011-01-17 VITALS — BP 72/48 | HR 74 | Temp 97.9°F | Ht 68.0 in | Wt 181.0 lb

## 2011-01-17 DIAGNOSIS — J449 Chronic obstructive pulmonary disease, unspecified: Secondary | ICD-10-CM

## 2011-01-17 DIAGNOSIS — N320 Bladder-neck obstruction: Secondary | ICD-10-CM

## 2011-01-17 DIAGNOSIS — E119 Type 2 diabetes mellitus without complications: Secondary | ICD-10-CM

## 2011-01-17 DIAGNOSIS — J4489 Other specified chronic obstructive pulmonary disease: Secondary | ICD-10-CM

## 2011-01-17 DIAGNOSIS — I10 Essential (primary) hypertension: Secondary | ICD-10-CM

## 2011-01-17 DIAGNOSIS — E785 Hyperlipidemia, unspecified: Secondary | ICD-10-CM

## 2011-01-17 DIAGNOSIS — R5381 Other malaise: Secondary | ICD-10-CM

## 2011-01-17 DIAGNOSIS — R5383 Other fatigue: Secondary | ICD-10-CM

## 2011-01-17 DIAGNOSIS — R630 Anorexia: Secondary | ICD-10-CM

## 2011-01-17 LAB — BASIC METABOLIC PANEL
BUN: 26 mg/dL — ABNORMAL HIGH (ref 6–23)
Chloride: 104 mEq/L (ref 96–112)
Potassium: 4.6 mEq/L (ref 3.5–5.1)
Sodium: 139 mEq/L (ref 135–145)

## 2011-01-17 LAB — URINALYSIS, ROUTINE W REFLEX MICROSCOPIC
Bilirubin Urine: NEGATIVE
Hgb urine dipstick: NEGATIVE
Ketones, ur: NEGATIVE
Leukocytes, UA: NEGATIVE
Nitrite: NEGATIVE

## 2011-01-17 LAB — HEPATIC FUNCTION PANEL
ALT: 27 U/L (ref 0–53)
AST: 29 U/L (ref 0–37)
Alkaline Phosphatase: 74 U/L (ref 39–117)
Bilirubin, Direct: 0 mg/dL (ref 0.0–0.3)
Total Bilirubin: 0.2 mg/dL — ABNORMAL LOW (ref 0.3–1.2)

## 2011-01-17 LAB — LIPID PANEL
HDL: 34.9 mg/dL — ABNORMAL LOW (ref >39.00)
Total CHOL/HDL Ratio: 4
Triglycerides: 176 mg/dL — ABNORMAL HIGH (ref 0.0–149.0)
VLDL: 35.2 mg/dL (ref 0.0–40.0)

## 2011-01-17 LAB — CBC WITH DIFFERENTIAL/PLATELET
Eosinophils Absolute: 0.2 10*3/uL (ref 0.0–0.7)
Lymphs Abs: 2.1 10*3/uL (ref 0.7–4.0)
MCHC: 33 g/dL (ref 30.0–36.0)
MCV: 98.5 fl (ref 78.0–100.0)
Monocytes Absolute: 0.6 10*3/uL (ref 0.1–1.0)
Neutrophils Relative %: 65 % (ref 43.0–77.0)
Platelets: 249 10*3/uL (ref 150.0–400.0)
RDW: 14.6 % (ref 11.5–14.6)

## 2011-01-17 LAB — TSH: TSH: 1.76 u[IU]/mL (ref 0.35–5.50)

## 2011-01-17 LAB — HEMOGLOBIN A1C: Hgb A1c MFr Bld: 7 % — ABNORMAL HIGH (ref 4.6–6.5)

## 2011-01-17 NOTE — Assessment & Plan Note (Addendum)
?   Part of FTT vs other-  to f/u any worsening symptoms or concerns, also for SPEP

## 2011-01-17 NOTE — Assessment & Plan Note (Signed)
With low BP on orthostatics today but apparently not orthostatic - to d/c ACEI, f/u next visit

## 2011-01-17 NOTE — Telephone Encounter (Signed)
Please call pt or wife - to stop the glipizide 10

## 2011-01-17 NOTE — Assessment & Plan Note (Signed)
stable overall by hx and exam, most recent data reviewed with pt, and pt to continue medical treatment as before, also for lipids and f/u next visit

## 2011-01-17 NOTE — Patient Instructions (Signed)
Please stop the Lisinopril Continue all other medications as before Please go to LAB in the Basement for the blood and/or urine tests to be done today Please go to XRAY in the Basement for the x-ray test Please call the phone number [phone] (the [place]) for results of testing in 2-3 days;  When calling, simply dial the number, and when prompted enter the MRN number above (the Medical Record Number) and the # key, then the message should start.

## 2011-01-17 NOTE — Progress Notes (Signed)
Subjective:    Patient ID: Tyler Oh., male    DOB: 1940-04-28, 70 y.o.   MRN: [id number]  HPI  Here with wife, memory dysfunction about the same, but with wt loss, decreased po intake "trying to lose wt" has had gradually increased general weakness and lethargy, increased sleeping most days;   Pt denies fever,  night sweats,  or other constitutional symptoms.  Pt denies chest pain, increased sob or doe, wheezing, orthopnea, PND, increased LE swelling, palpitations, dizziness or syncope.  Pt denies new neurological symptoms such as new headache, or facial or extremity weakness or numbness   Pt denies polydipsia, polyuria, or low sugar symptoms such as weakness or confusion improved with po intake.  Pt states overall good compliance with meds, trying to follow lower cholesterol, diabetic diet, wt overall stable but little exercise however.  Denies worsening depressive symptoms, suicidal ideation, or panic, though has ongoing anxiety, not increased recently.  Past Medical History  Diagnosis Date  . Angina at rest   . COPD (chronic obstructive pulmonary disease)   . Allergy   . Myocardial infarction   . Diabetes mellitus   . Emphysema of lung   . GERD (gastroesophageal reflux disease)   . Hypertension   . Hyperlipidemia   . Weight loss   . CAD (coronary artery disease)   . Anxiety   . PVD (peripheral vascular disease)   . Contusion     left chest wall  . Dysphagia   . Abdominal pain     generalized  . OA (osteoarthritis)   . Asthma   . Anemia     iron deficiency  . Memory loss   . Insomnia   . DDD (degenerative disc disease), lumbar   . OSA (obstructive sleep apnea)   . Hypersomnia   . Fatigue   . Abdominal  pain, other specified site   . Urinary hesitancy   . Low back pain   . Depression   . Benign prostatic hypertrophy   . Lumbar back pain     chronic  . Spinal stenosis   . Herniated disc   . History of colonic polyps   . Diverticulitis, colon   . Esophageal  stricture   . Hx of colonoscopy   . Chronic obstructive pulmonary disease (COPD)   . Rhinitis 11/17/2010   Past Surgical History  Procedure Date  . Heart bypass   . Bilateral knee replacements   . Hemorrhoid surgery   . Carpal tunnel release   . Shoulder arthroscopy right  . Coronary angioplasty with stent placement     reports that he has been smoking Cigarettes.  He has a 40 pack-year smoking history. He does not have any smokeless tobacco history on file. He reports that he does not drink alcohol or use illicit drugs. family history includes Allergies in an unspecified family member; Diabetes in an unspecified family member; Emphysema in his father; Heart disease in an unspecified family member; Hyperlipidemia in an unspecified family member; Hypertension in an unspecified family member; Kidney cancer in his daughter; and Stroke in his father.  There is no history of Colon cancer. Allergies  Allergen Reactions  . Celecoxib     REACTION: GI upset  . Morphine     REACTION: itching  . Nalbuphine     REACTION: contraindication with oxycontin.  . Prednisone     REACTION: "yeast infections"  . Venlafaxine     REACTION: GI upset   Current Outpatient Prescriptions on File  Prior to Visit  Medication Sig Dispense Refill  . ALPRAZolam (XANAX) 0.25 MG tablet take 1 tablet by mouth three times a day if needed  90 tablet  5  . Aspirin (ADULT ASPIRIN LOW STRENGTH) 81 MG EC tablet Take 81 mg by mouth daily.        . budesonide-formoterol (SYMBICORT) 160-4.5 MCG/ACT inhaler Inhale 2 puffs into the lungs 2 (two) times daily.        Marland Kitchen dexlansoprazole (DEXILANT) 60 MG capsule Take 60 mg by mouth daily.        Marland Kitchen donepezil (ARICEPT) 5 MG tablet Take 1 tablet (5 mg total) by mouth at bedtime.  30 tablet  11  . DULoxetine (CYMBALTA) 60 MG capsule Take 1 capsule (60 mg total) by mouth daily.  30 capsule  11  . ferrous sulfate 325 (65 FE) MG EC tablet Take 325 mg by mouth daily.       . fluticasone  (FLONASE) 50 MCG/ACT nasal spray Place 2 sprays into the nose daily.  16 g  2  . gabapentin (NEURONTIN) 300 MG capsule       . HYDROcodone-acetaminophen (LORCET) 10-650 MG per tablet       . metFORMIN (GLUCOPHAGE) 500 MG tablet take 2 tablets by mouth every morning and 1 tablet by mouth every evening  270 tablet  3  . Multiple Vitamin (MULTIVITAMIN) capsule Take 1 capsule by mouth daily.        . nitroGLYCERIN (NITROSTAT) 0.4 MG SL tablet Place 1 tablet (0.4 mg total) under the tongue every 5 (five) minutes as needed.  25 tablet  12  . omeprazole (PRILOSEC) 20 MG capsule Take 20 mg by mouth 2 (two) times daily.       Marland Kitchen oxyCODONE (OXYCONTIN) 80 MG 12 hr tablet Take 80 mg by mouth every 12 (twelve) hours. Taking 4 tabs twice daily      . pravastatin (PRAVACHOL) 80 MG tablet take 1 tablet by mouth once daily  90 tablet  3  . PROAIR HFA 108 (90 BASE) MCG/ACT inhaler inhale 2 puffs by mouth four times a day if needed  8.5 g  4  . tiZANidine (ZANAFLEX) 4 MG tablet take 1 to 2 tablets by mouth every 6 hours if needed  120 tablet  2  . zolpidem (AMBIEN CR) 12.5 MG CR tablet Take 12.5 mg by mouth at bedtime as needed.         Current Facility-Administered Medications on File Prior to Visit  Medication Dose Route Frequency Provider Last Rate Last Dose  . 0.9 %  sodium chloride infusion  500 mL Intravenous Continuous Scarlette Shorts, MD       Review of Systems Review of Systems  Constitutional: Negative for diaphoresis and  Eyes: Negative for photophobia and visual disturbance.  Respiratory: Negative for choking and stridor.   Gastrointestinal: Negative for vomiting and blood in stool.  Genitourinary: Negative for hematuria and decreased urine volume.       Objective:   Physical Exam BP 72/48  Pulse 74  Temp(Src) 97.9 F (36.6 C) (Oral)  Ht 5\' 8"  (1.727 m)  Wt 181 lb (82.101 kg)  BMI 27.52 kg/m2  SpO2 91% Physical Exam  VS noted, fatigued, tends to fall asleep, but able to stand to get up on exam  table with some assist Constitutional: Pt appears well-developed and well-nourished.  HENT: Head: Normocephalic.  Right Ear: External ear normal.  Left Ear: External ear normal.  Eyes: Conjunctivae and EOM are  normal. Pupils are equal, round, and reactive to light.  Neck: Normal range of motion. Neck supple.  Cardiovascular: Normal rate and regular rhythm.   Pulmonary/Chest: Effort normal and breath sounds decreased, trace wheezing bilat  Abd:  Soft, NT, non-distended, + BS Neurological: Pt is alert. No cranial nerve deficit.  Skin: Skin is warm. No erythema.  Psychiatric: Pt behavior is somewhat lethargic, ? Depressed affect      Assessment & Plan:

## 2011-01-17 NOTE — Telephone Encounter (Signed)
Wife informed, med list updated

## 2011-01-17 NOTE — Assessment & Plan Note (Signed)
stable overall by hx and exam, most recent data reviewed with pt, and pt to continue medical treatment as before  Lab Results  Component Value Date   HGBA1C 7.1* 05/31/2010

## 2011-01-17 NOTE — Assessment & Plan Note (Addendum)
Etiology unclear, Exam otherwise benign, to check labs as documented, follow with expectant management, ECG reviewed as per emr, pt without CP;  BP low today on ACE and recent wt loss - ok to d/c  Note:  Total time for visit today including wife and pt history taking, pt examination, review of orthostatic and ECG, determination of course of evaluation and treatment, and review most recent labs with family with face to face> 40 min, total time including documentation > 50 min

## 2011-01-17 NOTE — Telephone Encounter (Signed)
LAB CALLED WITH CRITICAL - Glucose 36, MD verbally informed

## 2011-01-17 NOTE — Assessment & Plan Note (Signed)
With persistent mild wheeze, for cxr- r/o pna vs volume

## 2011-01-18 LAB — BRAIN NATRIURETIC PEPTIDE: Pro B Natriuretic peptide (BNP): 43 pg/mL (ref 0.0–100.0)

## 2011-01-21 ENCOUNTER — Telehealth: Payer: Self-pay

## 2011-01-21 LAB — PROTEIN ELECTROPHORESIS, SERUM: Gamma Globulin: 11.9 % (ref 11.1–18.8)

## 2011-01-21 MED ORDER — GLIPIZIDE ER 5 MG PO TB24: 5.0000 mg | ORAL_TABLET | Freq: Every day | ORAL | Status: DC

## 2011-01-21 NOTE — Telephone Encounter (Signed)
Pt's spouse advised of Rx/pharmacy

## 2011-01-21 NOTE — Telephone Encounter (Signed)
Due to the severity of his recent low sugar, we can re-start the glipizide at 5 mg per day only (not the 10 like before)  Done per emr

## 2011-01-21 NOTE — Telephone Encounter (Signed)
Spoke with pt's wife who stated that weakness has improved and they have been checking CBGs more often. Wife states that pt's CBGs have been high- 250 at times and she has given him a Glipizide which helps.

## 2011-01-21 NOTE — Telephone Encounter (Signed)
Message copied by Lyman Bishop on Mon Jan 21, 2011 11:19 AM ------      Message from: Biagio Borg      Created: Sat Jan 19, 2011  3:54 PM      Regarding: weakness       Please contact pt to see if his weakness has improved off the sugar and BP medicines we stopped

## 2011-01-29 ENCOUNTER — Telehealth: Payer: Self-pay

## 2011-01-29 ENCOUNTER — Ambulatory Visit: Payer: Medicare Other | Admitting: Internal Medicine

## 2011-01-29 DIAGNOSIS — Z0289 Encounter for other administrative examinations: Secondary | ICD-10-CM

## 2011-01-29 NOTE — Telephone Encounter (Signed)
I would forgive the no show charge, but I have to defer to admin office policy - please refer wife to [facility] to discuss  Also, please check sugars bid and call with results in 3 days - we may need to decrease the DM med further

## 2011-01-29 NOTE — Telephone Encounter (Signed)
Patients wife called concerning labs before this mornings appt. He woke and BS down, ate some toast and drank some skim milk questioned if he should come for labs or appt. First.  Informed the patients wife that should come to appointment and see MD first.

## 2011-01-29 NOTE — Telephone Encounter (Signed)
Called the patients wife informed of instructions.

## 2011-01-29 NOTE — Telephone Encounter (Signed)
The patient called to inform he could not make appt. Today as felt too bad and weak. His wife checked his BS first thing this am and was 1. The patient had some toast and milk they rechecked his BS and was 119. Should [name] No show for the appointment, please advise as the wife did call earlier and they tried to come but could not. Informed the patients wife he should reschedule appointment and MD would be informed of patients symptoms.

## 2011-01-30 ENCOUNTER — Other Ambulatory Visit: Payer: Self-pay | Admitting: Internal Medicine

## 2011-02-06 ENCOUNTER — Encounter: Payer: Self-pay | Admitting: Internal Medicine

## 2011-02-06 ENCOUNTER — Ambulatory Visit (INDEPENDENT_AMBULATORY_CARE_PROVIDER_SITE_OTHER): Payer: Medicare Other | Admitting: Internal Medicine

## 2011-02-06 VITALS — BP 112/70 | HR 62 | Temp 97.0°F | Ht 68.5 in | Wt 181.5 lb

## 2011-02-06 DIAGNOSIS — I10 Essential (primary) hypertension: Secondary | ICD-10-CM

## 2011-02-06 DIAGNOSIS — E785 Hyperlipidemia, unspecified: Secondary | ICD-10-CM

## 2011-02-06 DIAGNOSIS — J438 Other emphysema: Secondary | ICD-10-CM

## 2011-02-06 DIAGNOSIS — Z23 Encounter for immunization: Secondary | ICD-10-CM

## 2011-02-06 DIAGNOSIS — E119 Type 2 diabetes mellitus without complications: Secondary | ICD-10-CM

## 2011-02-06 MED ORDER — GLIPIZIDE ER 2.5 MG PO TB24: ORAL_TABLET | ORAL | Status: DC

## 2011-02-06 MED ORDER — METFORMIN HCL 500 MG PO TABS: ORAL_TABLET | ORAL | Status: DC

## 2011-02-06 NOTE — Patient Instructions (Addendum)
You had the flu shot today We did not have the albuterol inhaler today Please stop smoking Increase the metformin to total of 1000 mg twice per day Also, add the glipizide ER 2.5 mg in the evening only for blood sugar more than 180 Continue all other medications as before Please return in 6 weeks, or sooner if needed Please call for any further low sugars

## 2011-02-06 NOTE — Progress Notes (Signed)
Subjective:    Patient ID: Tyler Oh., male    DOB: Apr 14, 1940, 70 y.o.   MRN: [id number]  HPI  Here to f/u;  Much brighter and energetic today and has regained some appetitie;  Fatigue much improved;  cbg' have been also increasing with this as well - now in 200's, occas 300's, but this am only 133.  Here to f/u; overall doing ok,  Pt denies chest pain, increased sob or doe, wheezing, orthopnea, PND, increased LE swelling, palpitations, dizziness or syncope.  Pt denies new neurological symptoms such as new headache, or facial or extremity weakness or numbness   Pt denies polydipsia, polyuria, or low sugar symptoms such as weakness or confusion improved with po intake.  Pt states overall good compliance with meds, trying to follow lower cholesterol, diabetic diet, wt overall stable but little exercise however. Past Medical History  Diagnosis Date  . Angina at rest   . COPD (chronic obstructive pulmonary disease)   . Allergy   . Myocardial infarction   . Diabetes mellitus   . Emphysema of lung   . GERD (gastroesophageal reflux disease)   . Hypertension   . Hyperlipidemia   . Weight loss   . CAD (coronary artery disease)   . Anxiety   . PVD (peripheral vascular disease)   . Contusion     left chest wall  . Dysphagia   . Abdominal pain     generalized  . OA (osteoarthritis)   . Asthma   . Anemia     iron deficiency  . Memory loss   . Insomnia   . DDD (degenerative disc disease), lumbar   . OSA (obstructive sleep apnea)   . Hypersomnia   . Fatigue   . Abdominal  pain, other specified site   . Urinary hesitancy   . Low back pain   . Depression   . Benign prostatic hypertrophy   . Lumbar back pain     chronic  . Spinal stenosis   . Herniated disc   . History of colonic polyps   . Diverticulitis, colon   . Esophageal stricture   . Hx of colonoscopy   . Chronic obstructive pulmonary disease (COPD)   . Rhinitis 11/17/2010   Past Surgical History  Procedure Date  .  Heart bypass   . Bilateral knee replacements   . Hemorrhoid surgery   . Carpal tunnel release   . Shoulder arthroscopy right  . Coronary angioplasty with stent placement     reports that he has been smoking Cigarettes.  He has a 40 pack-year smoking history. He does not have any smokeless tobacco history on file. He reports that he does not drink alcohol or use illicit drugs. family history includes Allergies in an unspecified family member; Diabetes in an unspecified family member; Emphysema in his father; Heart disease in an unspecified family member; Hyperlipidemia in an unspecified family member; Hypertension in an unspecified family member; Kidney cancer in his daughter; and Stroke in his father.  There is no history of Colon cancer. Allergies  Allergen Reactions  . Celecoxib     REACTION: GI upset  . Morphine     REACTION: itching  . Nalbuphine     REACTION: contraindication with oxycontin.  . Prednisone     REACTION: "yeast infections"  . Venlafaxine     REACTION: GI upset   Current Outpatient Prescriptions on File Prior to Visit  Medication Sig Dispense Refill  . ALPRAZolam (XANAX) 0.25 MG tablet  take 1 tablet by mouth three times a day if needed  90 tablet  5  . Aspirin (ADULT ASPIRIN LOW STRENGTH) 81 MG EC tablet Take 81 mg by mouth daily.        . budesonide-formoterol (SYMBICORT) 160-4.5 MCG/ACT inhaler Inhale 2 puffs into the lungs 2 (two) times daily.        Marland Kitchen dexlansoprazole (DEXILANT) 60 MG capsule Take 60 mg by mouth daily.        Marland Kitchen donepezil (ARICEPT) 5 MG tablet Take 1 tablet (5 mg total) by mouth at bedtime.  30 tablet  11  . DULoxetine (CYMBALTA) 60 MG capsule Take 1 capsule (60 mg total) by mouth daily.  30 capsule  11  . ferrous sulfate 325 (65 FE) MG EC tablet Take 325 mg by mouth daily.       . fluticasone (FLONASE) 50 MCG/ACT nasal spray instill 2 sprays into each nostril once daily  16 g  11  . gabapentin (NEURONTIN) 300 MG capsule       . glipiZIDE  (GLIPIZIDE XL) 5 MG 24 hr tablet Take 1 tablet (5 mg total) by mouth daily.  90 tablet  3  . HYDROcodone-acetaminophen (LORCET) 10-650 MG per tablet       . Multiple Vitamin (MULTIVITAMIN) capsule Take 1 capsule by mouth daily.        . nitroGLYCERIN (NITROSTAT) 0.4 MG SL tablet Place 1 tablet (0.4 mg total) under the tongue every 5 (five) minutes as needed.  25 tablet  12  . omeprazole (PRILOSEC) 20 MG capsule Take 20 mg by mouth 2 (two) times daily.       Marland Kitchen oxyCODONE (OXYCONTIN) 80 MG 12 hr tablet Take 80 mg by mouth every 12 (twelve) hours. Taking 4 tabs twice daily      . pravastatin (PRAVACHOL) 80 MG tablet take 1 tablet by mouth once daily  90 tablet  3  . PROAIR HFA 108 (90 BASE) MCG/ACT inhaler inhale 2 puffs by mouth four times a day if needed  8.5 g  4  . tiZANidine (ZANAFLEX) 4 MG tablet take 1 to 2 tablets by mouth every 6 hours if needed  120 tablet  2  . zolpidem (AMBIEN CR) 12.5 MG CR tablet Take 12.5 mg by mouth at bedtime as needed.         Current Facility-Administered Medications on File Prior to Visit  Medication Dose Route Frequency Provider Last Rate Last Dose  . 0.9 %  sodium chloride infusion  500 mL Intravenous Continuous Scarlette Shorts, MD       Review of Systems Review of Systems  Constitutional: Negative for diaphoresis and unexpected weight change.  HENT: Negative for drooling and tinnitus.   Eyes: Negative for photophobia and visual disturbance.  Respiratory: Negative for choking and stridor.   Gastrointestinal: Negative for vomiting and blood in stool.  Genitourinary: Negative for hematuria and decreased urine volume.  Objective:   Physical Exam BP 112/70  Pulse 62  Temp(Src) 97 F (36.1 C) (Oral)  Ht 5' 8.5" (1.74 m)  Wt 181 lb 8 oz (82.328 kg)  BMI 27.20 kg/m2  SpO2 90% Physical Exam  VS noted, not ill appearing, answers questions easily, gets up on exam table without signficant help Constitutional: Pt appears well-developed and well-nourished.  HENT:  Head: Normocephalic.  Right Ear: External ear normal.  Left Ear: External ear normal.  Eyes: Conjunctivae and EOM are normal. Pupils are equal, round, and reactive to light.  Neck: Normal  range of motion. Neck supple.  Cardiovascular: Normal rate and regular rhythm.   Pulmonary/Chest: Effort normal and breath sounds decreased bilat Neurological: Pt is alert. No cranial nerve deficit.  Skin: Skin is warm. No erythema.  Psychiatric: Pt behavior is normal. Thought content normal.     Assessment & Plan:

## 2011-02-06 NOTE — Assessment & Plan Note (Signed)
Mild uncontrolled - ok to incresae the metformin to 1000 bid, but also gluctrol xl 2.5 mg at dinner for cbg > 180 only, Continue all other medications as before .  to f/u any worsening symptoms or concerns

## 2011-02-07 ENCOUNTER — Encounter: Payer: Self-pay | Admitting: Internal Medicine

## 2011-02-07 NOTE — Assessment & Plan Note (Signed)
stable overall by hx and exam, most recent data reviewed with pt, and pt to continue medical treatment as before  Lab Results  Component Value Date   [place] 85 01/17/2011

## 2011-02-07 NOTE — Assessment & Plan Note (Signed)
stable overall by hx and exam, most recent data reviewed with pt, and pt to continue medical treatment as before  SpO2 Readings from Last 3 Encounters:  02/06/11 90%  01/17/11 91%  11/16/10 90%

## 2011-02-07 NOTE — Assessment & Plan Note (Signed)
stable overall by hx and exam, most recent data reviewed with pt, and pt to continue medical treatment as before  BP Readings from Last 3 Encounters:  02/06/11 112/70  01/17/11 72/48  11/22/10 101/65

## 2011-03-05 ENCOUNTER — Encounter: Payer: Self-pay | Admitting: *Deleted

## 2011-03-20 ENCOUNTER — Ambulatory Visit (INDEPENDENT_AMBULATORY_CARE_PROVIDER_SITE_OTHER): Payer: Medicare Other | Admitting: Internal Medicine

## 2011-03-20 ENCOUNTER — Encounter: Payer: Self-pay | Admitting: Internal Medicine

## 2011-03-20 VITALS — BP 100/58 | HR 48 | Temp 98.0°F | Ht 68.0 in | Wt 175.5 lb

## 2011-03-20 DIAGNOSIS — E162 Hypoglycemia, unspecified: Secondary | ICD-10-CM

## 2011-03-20 DIAGNOSIS — J449 Chronic obstructive pulmonary disease, unspecified: Secondary | ICD-10-CM

## 2011-03-20 DIAGNOSIS — F329 Major depressive disorder, single episode, unspecified: Secondary | ICD-10-CM

## 2011-03-20 MED ORDER — HYDROCODONE-ACETAMINOPHEN 10-650 MG PO TABS: 1.0000 | ORAL_TABLET | Freq: Four times a day (QID) | ORAL | Status: DC | PRN

## 2011-03-20 MED ORDER — OXYCODONE HCL 80 MG PO TB12: ORAL_TABLET | ORAL | Status: DC

## 2011-03-20 MED ORDER — GLIPIZIDE ER 5 MG PO TB24: ORAL_TABLET | ORAL | Status: DC

## 2011-03-20 NOTE — Patient Instructions (Signed)
Continue all other medications as before No blood work needed today Please return in 6 months, or sooner if needed

## 2011-03-24 ENCOUNTER — Encounter: Payer: Self-pay | Admitting: Internal Medicine

## 2011-03-24 DIAGNOSIS — E162 Hypoglycemia, unspecified: Secondary | ICD-10-CM | POA: Insufficient documentation

## 2011-03-24 NOTE — Assessment & Plan Note (Signed)
stable overall by hx and exam, most recent data reviewed with pt, and pt to continue medical treatment as before  Lab Results  Component Value Date   WBC 8.6 01/17/2011   HGB 12.6* 01/17/2011   HCT 38.2* 01/17/2011   PLT 249.0 01/17/2011   GLUCOSE 36* 01/17/2011   CHOL 155 01/17/2011   TRIG 176.0* 01/17/2011   HDL 34.90* 01/17/2011   LDLDIRECT 98.6 05/31/2010   LDLCALC 85 01/17/2011   ALT 27 01/17/2011   AST 29 01/17/2011   NA 139 01/17/2011   K 4.6 01/17/2011   CL 104 01/17/2011   CREATININE 0.9 01/17/2011   BUN 26* 01/17/2011   CO2 26 01/17/2011   TSH 1.76 01/17/2011   PSA 0.09* 01/17/2011   HGBA1C 7.0* 01/17/2011   MICROALBUR 1.0 01/16/2010

## 2011-03-24 NOTE — Progress Notes (Signed)
Subjective:    Patient ID: Tyler Oh., male    DOB: [birth year], 70 y.o.   MRN: [id number]  HPI Here to f/u with wife who cont's to be supportive;  He walks with cane and is on extremely high dose oxycontin for pain management, but with reduction of his sulfonyurea due to hypoglycemia found last visit, he has resumed his usual state of health, and back to baseline cognition, stamina, and ability to perform ADL's.  Pt denies chest pain, increased sob or doe, wheezing, orthopnea, PND, increased LE swelling, palpitations, dizziness or syncope.  Pt denies new neurological symptoms such as new headache, or facial or extremity weakness or numbness   Pt denies polydipsia, polyuria, or low sugar symptoms such as weakness or confusion improved with po intake.  Pt states overall good compliance with meds, trying to follow lower cholesterol, diabetic diet, wt overall stable but little exercise however.   Denies worsening depressive symptoms, suicidal ideation, or panic, though has ongoing anxiety Past Medical History  Diagnosis Date  . Angina at rest   . COPD (chronic obstructive pulmonary disease)   . Allergy   . Myocardial infarction   . Diabetes mellitus   . Emphysema of lung   . GERD (gastroesophageal reflux disease)   . Hypertension   . Hyperlipidemia   . Weight loss   . CAD (coronary artery disease)   . Anxiety   . PVD (peripheral vascular disease)   . Contusion     left chest wall  . Dysphagia   . Abdominal pain     generalized  . OA (osteoarthritis)   . Asthma   . Anemia     iron deficiency  . Memory loss   . Insomnia   . DDD (degenerative disc disease), lumbar   . OSA (obstructive sleep apnea)   . Hypersomnia   . Fatigue   . Abdominal  pain, other specified site   . Urinary hesitancy   . Low back pain   . Depression   . Benign prostatic hypertrophy   . Lumbar back pain     chronic  . Spinal stenosis   . Herniated disc   . History of colonic polyps   .  Diverticulitis, colon   . Esophageal stricture   . Hx of colonoscopy   . Chronic obstructive pulmonary disease (COPD)   . Rhinitis 11/17/2010   Past Surgical History  Procedure Date  . Heart bypass   . Bilateral knee replacements   . Hemorrhoid surgery   . Carpal tunnel release   . Shoulder arthroscopy right  . Coronary angioplasty with stent placement     reports that he has been smoking Cigarettes.  He has a 40 pack-year smoking history. He does not have any smokeless tobacco history on file. He reports that he does not drink alcohol or use illicit drugs. family history includes Allergies in an unspecified family member; Diabetes in an unspecified family member; Emphysema in his father; Heart disease in an unspecified family member; Hyperlipidemia in an unspecified family member; Hypertension in an unspecified family member; Kidney cancer in his daughter; and Stroke in his father.  There is no history of Colon cancer. Allergies  Allergen Reactions  . Celecoxib     REACTION: GI upset  . Morphine     REACTION: itching  . Nalbuphine     REACTION: contraindication with oxycontin.  . Prednisone     REACTION: "yeast infections"  . Venlafaxine     REACTION: GI  upset   Current Outpatient Prescriptions on File Prior to Visit  Medication Sig Dispense Refill  . ALPRAZolam (XANAX) 0.25 MG tablet take 1 tablet by mouth three times a day if needed  90 tablet  5  . Aspirin (ADULT ASPIRIN LOW STRENGTH) 81 MG EC tablet Take 81 mg by mouth daily.        . budesonide-formoterol (SYMBICORT) 160-4.5 MCG/ACT inhaler Inhale 2 puffs into the lungs 2 (two) times daily.        Marland Kitchen dexlansoprazole (DEXILANT) 60 MG capsule Take 60 mg by mouth daily.        Marland Kitchen donepezil (ARICEPT) 5 MG tablet Take 1 tablet (5 mg total) by mouth at bedtime.  30 tablet  11  . DULoxetine (CYMBALTA) 60 MG capsule Take 1 capsule (60 mg total) by mouth daily.  30 capsule  11  . ferrous sulfate 325 (65 FE) MG EC tablet Take 325 mg by  mouth daily.       . fluticasone (FLONASE) 50 MCG/ACT nasal spray instill 2 sprays into each nostril once daily  16 g  11  . gabapentin (NEURONTIN) 300 MG capsule       . glipiZIDE (GLUCOTROL XL) 2.5 MG 24 hr tablet 1 tab by mouth per day only for blood sugar more than 180  90 tablet  3  . metFORMIN (GLUCOPHAGE) 500 MG tablet 2 tabs by mouth twice per day  360 tablet  3  . Multiple Vitamin (MULTIVITAMIN) capsule Take 1 capsule by mouth daily.        . nitroGLYCERIN (NITROSTAT) 0.4 MG SL tablet Place 1 tablet (0.4 mg total) under the tongue every 5 (five) minutes as needed.  25 tablet  12  . omeprazole (PRILOSEC) 20 MG capsule Take 20 mg by mouth 2 (two) times daily.       . pravastatin (PRAVACHOL) 80 MG tablet take 1 tablet by mouth once daily  90 tablet  3  . PROAIR HFA 108 (90 BASE) MCG/ACT inhaler inhale 2 puffs by mouth four times a day if needed  8.5 g  4  . tiZANidine (ZANAFLEX) 4 MG tablet take 1 to 2 tablets by mouth every 6 hours if needed  120 tablet  2   Review of Systems Review of Systems  Constitutional: Negative for diaphoresis and unexpected weight change.  HENT: Negative for drooling and tinnitus.   Eyes: Negative for photophobia and visual disturbance.  Respiratory: Negative for choking and stridor.   Gastrointestinal: Negative for vomiting and blood in stool.  Genitourinary: Negative for hematuria and decreased urine volume.    Objective:   Physical Exam BP 100/58  Pulse 48  Temp(Src) 98 F (36.7 C) (Oral)  Ht 5\' 8"  (1.727 m)  Wt 175 lb 8 oz (79.606 kg)  BMI 26.68 kg/m2  SpO2 95% Physical Exam  VS noted, not ill appaering, able to get up to exam table without assist, bright, answers questions readily Constitutional: Pt appears well-developed and well-nourished.  HENT: Head: Normocephalic.  Right Ear: External ear normal.  Left Ear: External ear normal.  Eyes: Conjunctivae and EOM are normal. Pupils are equal, round, and reactive to light.  Neck: Normal range of  motion. Neck supple.  Cardiovascular: Normal rate and regular rhythm.   Pulmonary/Chest: Effort normal and breath sounds normal.  Abd:  Soft, NT, non-distended, + BS Neurological: Pt is alert. No cranial nerve deficit.  Skin: Skin is warm. No erythema.  Psychiatric: Pt behavior is normal. 1+ nervous, not depressed  affect    Assessment & Plan:

## 2011-03-24 NOTE — Assessment & Plan Note (Signed)
stable overall by hx and exam, most recent data reviewed with pt, and pt to continue medical treatment as before  SpO2 Readings from Last 3 Encounters:  03/20/11 95%  02/06/11 90%  01/17/11 91%

## 2011-03-24 NOTE — Assessment & Plan Note (Signed)
Improved with med adjustment with resumption of baseline stamina, cognition and o/w uncomplicated episode per last visit; to cont meds as is for now though [place] would be safer than current sulfonylurea but unable to afford at this time

## 2011-04-03 ENCOUNTER — Other Ambulatory Visit: Payer: Self-pay | Admitting: Internal Medicine

## 2011-04-11 DIAGNOSIS — M545 Low back pain, unspecified: Secondary | ICD-10-CM | POA: Diagnosis not present

## 2011-04-11 DIAGNOSIS — M5430 Sciatica, unspecified side: Secondary | ICD-10-CM | POA: Diagnosis not present

## 2011-04-11 DIAGNOSIS — G541 Lumbosacral plexus disorders: Secondary | ICD-10-CM | POA: Diagnosis not present

## 2011-04-11 DIAGNOSIS — IMO0002 Reserved for concepts with insufficient information to code with codable children: Secondary | ICD-10-CM | POA: Diagnosis not present

## 2011-04-17 ENCOUNTER — Ambulatory Visit (INDEPENDENT_AMBULATORY_CARE_PROVIDER_SITE_OTHER): Payer: Medicare Other | Admitting: Internal Medicine

## 2011-04-17 ENCOUNTER — Encounter: Payer: Self-pay | Admitting: Internal Medicine

## 2011-04-17 ENCOUNTER — Ambulatory Visit (INDEPENDENT_AMBULATORY_CARE_PROVIDER_SITE_OTHER)
Admission: RE | Admit: 2011-04-17 | Discharge: 2011-04-17 | Disposition: A | Discharge status: Home / Self Care | Payer: Medicare Other | Source: Ambulatory Visit | Attending: Internal Medicine | Admitting: Internal Medicine

## 2011-04-17 VITALS — BP 112/70 | HR 51 | Temp 97.9°F | Ht 68.0 in | Wt 172.0 lb

## 2011-04-17 DIAGNOSIS — R634 Abnormal weight loss: Secondary | ICD-10-CM | POA: Diagnosis not present

## 2011-04-17 DIAGNOSIS — J449 Chronic obstructive pulmonary disease, unspecified: Secondary | ICD-10-CM | POA: Diagnosis not present

## 2011-04-17 DIAGNOSIS — I10 Essential (primary) hypertension: Secondary | ICD-10-CM

## 2011-04-17 DIAGNOSIS — F411 Generalized anxiety disorder: Secondary | ICD-10-CM | POA: Diagnosis not present

## 2011-04-17 DIAGNOSIS — E119 Type 2 diabetes mellitus without complications: Secondary | ICD-10-CM

## 2011-04-17 DIAGNOSIS — R0989 Other specified symptoms and signs involving the circulatory and respiratory systems: Secondary | ICD-10-CM | POA: Diagnosis not present

## 2011-04-17 MED ORDER — CLONAZEPAM 0.5 MG PO TABS: 0.5000 mg | ORAL_TABLET | Freq: Three times a day (TID) | ORAL | Status: DC | PRN

## 2011-04-17 NOTE — Patient Instructions (Addendum)
OK to stop the xanax Take all new medications as prescribed - the klonazepam as needed for nerves Continue all other medications as before Please go to XRAY in the Basement for the x-ray test Please call the phone number [phone] (the [facility]) for results of testing in 2-3 days;  When calling, simply dial the number, and when prompted enter the MRN number above (the Medical Record Number) and the # key, then the message should start. Please return in 6 months, or sooner if needed

## 2011-04-21 ENCOUNTER — Other Ambulatory Visit: Payer: Self-pay | Admitting: Internal Medicine

## 2011-04-21 ENCOUNTER — Encounter: Payer: Self-pay | Admitting: Internal Medicine

## 2011-04-21 NOTE — Assessment & Plan Note (Signed)
stable overall by hx and exam, most recent data reviewed with pt, and pt to continue medical treatment as before  BP Readings from Last 3 Encounters:  04/17/11 112/70  03/20/11 100/58  02/06/11 112/70

## 2011-04-21 NOTE — Assessment & Plan Note (Addendum)
To add klnopin/cymbalta for symtpom control,  to f/u any worsening symptoms or concerns, declines counseling

## 2011-04-21 NOTE — Progress Notes (Signed)
Subjective:    Patient ID: Tyler Oh., male    DOB: 1940-10-23, 71 y.o.   MRN: [id number]  HPI  Here to f/u; overall doing ok,  Pt denies chest pain, increased sob or doe, wheezing, orthopnea, PND, increased LE swelling, palpitations, dizziness or syncope.  Pt denies new neurological symptoms such as new headache, or facial or extremity weakness or numbness   Pt denies polydipsia, polyuria, or low sugar symptoms such as weakness or confusion improved with po intake.  Pt states overall good compliance with meds, trying to follow lower cholesterol, diabetic diet, wt overall stable but little exercise however.  Denies worsening depressive symptoms, suicidal ideation, though has ongoing anxiety, with marked increase recently with several panic attacks and decreased appetitie and wt loss.   Pt denies fever, night sweats, or other constitutional symptoms Past Medical History  Diagnosis Date  . Angina at rest   . COPD (chronic obstructive pulmonary disease)   . Allergy   . Myocardial infarction   . Diabetes mellitus   . Emphysema of lung   . GERD (gastroesophageal reflux disease)   . Hypertension   . Hyperlipidemia   . Weight loss   . CAD (coronary artery disease)   . Anxiety   . PVD (peripheral vascular disease)   . Contusion     left chest wall  . Dysphagia   . Abdominal pain     generalized  . OA (osteoarthritis)   . Asthma   . Anemia     iron deficiency  . Memory loss   . Insomnia   . DDD (degenerative disc disease), lumbar   . OSA (obstructive sleep apnea)   . Hypersomnia   . Fatigue   . Abdominal  pain, other specified site   . Urinary hesitancy   . Low back pain   . Depression   . Benign prostatic hypertrophy   . Lumbar back pain     chronic  . Spinal stenosis   . Herniated disc   . History of colonic polyps   . Diverticulitis, colon   . Esophageal stricture   . Hx of colonoscopy   . Chronic obstructive pulmonary disease (COPD)   . Rhinitis 11/17/2010    Past Surgical History  Procedure Date  . Heart bypass   . Bilateral knee replacements   . Hemorrhoid surgery   . Carpal tunnel release   . Shoulder arthroscopy right  . Coronary angioplasty with stent placement     reports that he has been smoking Cigarettes.  He has a 40 pack-year smoking history. He does not have any smokeless tobacco history on file. He reports that he does not drink alcohol or use illicit drugs. family history includes Allergies in an unspecified family member; Diabetes in an unspecified family member; Emphysema in his father; Heart disease in an unspecified family member; Hyperlipidemia in an unspecified family member; Hypertension in an unspecified family member; Kidney cancer in his daughter; and Stroke in his father.  There is no history of Colon cancer. Allergies  Allergen Reactions  . Celecoxib     REACTION: GI upset  . Morphine     REACTION: itching  . Nalbuphine     REACTION: contraindication with oxycontin.  . Prednisone     REACTION: "yeast infections"  . Venlafaxine     REACTION: GI upset   Current Outpatient Prescriptions on File Prior to Visit  Medication Sig Dispense Refill  . Aspirin (ADULT ASPIRIN LOW STRENGTH) 81 MG EC tablet  Take 81 mg by mouth daily.        . budesonide-formoterol (SYMBICORT) 160-4.5 MCG/ACT inhaler Inhale 2 puffs into the lungs 2 (two) times daily.        Marland Kitchen donepezil (ARICEPT) 5 MG tablet Take 1 tablet (5 mg total) by mouth at bedtime.  30 tablet  11  . DULoxetine (CYMBALTA) 60 MG capsule Take 1 capsule (60 mg total) by mouth daily.  30 capsule  11  . ferrous sulfate 325 (65 FE) MG EC tablet Take 325 mg by mouth daily.       . fluticasone (FLONASE) 50 MCG/ACT nasal spray instill 2 sprays into each nostril once daily  16 g  11  . gabapentin (NEURONTIN) 300 MG capsule       . glipiZIDE (GLIPIZIDE XL) 5 MG 24 hr tablet 1 tab by mouth daily prn blood sugar > 180  90 tablet  3  . glipiZIDE (GLUCOTROL XL) 2.5 MG 24 hr tablet 1  tab by mouth per day only for blood sugar more than 180  90 tablet  3  . HYDROcodone-acetaminophen (LORCET) 10-650 MG per tablet Take 1 tablet by mouth every 6 (six) hours as needed for pain.  120 tablet  0  . lisinopril (PRINIVIL,ZESTRIL) 20 MG tablet take 1 tablet by mouth once daily  90 tablet  3  . metFORMIN (GLUCOPHAGE) 500 MG tablet 2 tabs by mouth twice per day  360 tablet  3  . Multiple Vitamin (MULTIVITAMIN) capsule Take 1 capsule by mouth daily.        . nitroGLYCERIN (NITROSTAT) 0.4 MG SL tablet Place 1 tablet (0.4 mg total) under the tongue every 5 (five) minutes as needed.  25 tablet  12  . omeprazole (PRILOSEC) 20 MG capsule Take 20 mg by mouth 2 (two) times daily.       Marland Kitchen oxyCODONE (OXYCONTIN) 80 MG 12 hr tablet Taking 4 tabs twice daily  360 tablet  0  . pravastatin (PRAVACHOL) 80 MG tablet take 1 tablet by mouth once daily  90 tablet  3  . PROAIR HFA 108 (90 BASE) MCG/ACT inhaler inhale 2 puffs by mouth four times a day if needed  8.5 g  4  . tiZANidine (ZANAFLEX) 4 MG tablet take 1 to 2 tablets by mouth every 6 hours if needed  120 tablet  2   Review of Systems Review of Systems  Constitutional: Negative for diaphoresis and unexpected weight change.  HENT: Negative for drooling and tinnitus.   Eyes: Negative for photophobia and visual disturbance.  Respiratory: Negative for choking and stridor.   Gastrointestinal: Negative for vomiting and blood in stool.  Genitourinary: Negative for hematuria and decreased urine volume.      Objective:   Physical Exam BP 112/70  Pulse 51  Temp(Src) 97.9 F (36.6 C) (Oral)  Ht 5\' 8"  (1.727 m)  Wt 172 lb (78.019 kg)  BMI 26.15 kg/m2  SpO2 92% Physical Exam  VS noted Constitutional: Pt appears well-developed and well-nourished.  HENT: Head: Normocephalic.  Right Ear: External ear normal.  Left Ear: External ear normal.  Eyes: Conjunctivae and EOM are normal. Pupils are equal, round, and reactive to light.  Neck: Normal range of  motion. Neck supple.  Cardiovascular: Normal rate and regular rhythm.   Pulmonary/Chest: Effort normal and breath sounds normal.  Abd:  Soft, NT, non-distended, + BS Neurological: Pt is alert. No cranial nerve deficit.  Skin: Skin is warm. No erythema.  Psychiatric: Pt behavior is normal.  Thought content normal. 2+ nervous, tremulous    Assessment & Plan:

## 2011-04-21 NOTE — Assessment & Plan Note (Signed)
stable overall by hx and exam, most recent data reviewed with pt, and pt to continue medical treatment as before  Lab Results  Component Value Date   HGBA1C 7.0* 01/17/2011

## 2011-04-21 NOTE — Assessment & Plan Note (Signed)
Likely emotional related, but with ongoing smoking to check cxr

## 2011-05-13 DIAGNOSIS — H519 Unspecified disorder of binocular movement: Secondary | ICD-10-CM | POA: Diagnosis not present

## 2011-05-15 ENCOUNTER — Telehealth: Payer: Self-pay

## 2011-05-15 MED ORDER — LORAZEPAM 1 MG PO TABS: ORAL_TABLET | ORAL | Status: DC

## 2011-05-15 NOTE — Telephone Encounter (Signed)
Pt's spouse called stating that Klonopin is only causing pt to be drowsy but not helping with anxiety. Pt is requesting to be changed back to Alprazolam, please advise.

## 2011-05-15 NOTE — Telephone Encounter (Signed)
Please try ativan asd  - Done hardcopy to robin

## 2011-05-16 NOTE — Telephone Encounter (Signed)
Called left message to call back 

## 2011-05-16 NOTE — Telephone Encounter (Signed)
Faxed hardcopy to pharmacy. 

## 2011-05-17 ENCOUNTER — Telehealth: Payer: Self-pay | Admitting: Internal Medicine

## 2011-05-17 MED ORDER — ALPRAZOLAM 0.25 MG PO TABS: 0.2500 mg | ORAL_TABLET | Freq: Three times a day (TID) | ORAL | Status: AC | PRN

## 2011-05-17 NOTE — Telephone Encounter (Signed)
Gave the patients wife the hardcopy of prescription.

## 2011-05-17 NOTE — Telephone Encounter (Signed)
Wife here today, pt has tried klnopin and ativan in the past, found to be too strong/sedating  Ok for low dose alprzolam prn - Done hardcopy to [employer] to [employer]

## 2011-05-28 ENCOUNTER — Encounter: Payer: Self-pay | Admitting: Internal Medicine

## 2011-05-28 ENCOUNTER — Other Ambulatory Visit (INDEPENDENT_AMBULATORY_CARE_PROVIDER_SITE_OTHER): Payer: Medicare Other

## 2011-05-28 ENCOUNTER — Ambulatory Visit (INDEPENDENT_AMBULATORY_CARE_PROVIDER_SITE_OTHER): Payer: Medicare Other | Admitting: Internal Medicine

## 2011-05-28 VITALS — BP 110/70 | HR 68 | Ht 68.0 in | Wt 169.8 lb

## 2011-05-28 DIAGNOSIS — R634 Abnormal weight loss: Secondary | ICD-10-CM | POA: Diagnosis not present

## 2011-05-28 DIAGNOSIS — R1013 Epigastric pain: Secondary | ICD-10-CM

## 2011-05-28 DIAGNOSIS — K5900 Constipation, unspecified: Secondary | ICD-10-CM

## 2011-05-28 DIAGNOSIS — R1310 Dysphagia, unspecified: Secondary | ICD-10-CM

## 2011-05-28 DIAGNOSIS — E119 Type 2 diabetes mellitus without complications: Secondary | ICD-10-CM

## 2011-05-28 DIAGNOSIS — K219 Gastro-esophageal reflux disease without esophagitis: Secondary | ICD-10-CM

## 2011-05-28 LAB — BASIC METABOLIC PANEL
CO2: 27 mEq/L (ref 19–32)
Chloride: 100 mEq/L (ref 96–112)
Potassium: 4.8 mEq/L (ref 3.5–5.1)
Sodium: 138 mEq/L (ref 135–145)

## 2011-05-28 NOTE — Patient Instructions (Addendum)
.  You have been given a separate informational sheet regarding your tobacco use, the importance of quitting and local resources to help you quit.  Your physician has requested that you go to the basement for  lab work before leaving today  You have been scheduled for an endoscopy with propofol. Please follow written instructions given to you at your visit today.  You have been scheduled for a CT scan of the abdomen and pelvis at the [facility] location.  You are scheduled on 05-31-11 at 9:00am. You should arrive 15 minutes prior to your appointment time for registration. Please follow the written instructions below on the day of your exam:  WARNING: IF YOU ARE ALLERGIC TO IODINE/X-RAY DYE, PLEASE NOTIFY RADIOLOGY IMMEDIATELY AT [phone]! YOU WILL BE GIVEN A 13 HOUR PREMEDICATION PREP.  1) Do not eat or drink anything after 5:00am (4 hours prior to your test) 2) You have been given 2 bottles of oral contrast to drink. The solution may taste better if refrigerated, but do NOT add ice or any other liquid to this solution. Shake well before drinking.    Drink 1 bottle of contrast @ 7:00am (2 hours prior to your exam)  Drink 1 bottle of contrast @ 8:00am (1 hour prior to your exam)  You may take any medications as prescribed with a small amount of water except for the following: Metformin, Glucophage, Glucovance, Avandamet, Riomet, Fortamet, Actoplus Met, Janumet, Glumetza or Metaglip. The above medications must be held the day of the exam AND 48 hours after the exam.  The purpose of you drinking the oral contrast is to aid in the visualization of your intestinal tract. The contrast solution may cause some diarrhea. Before your exam is started, you will be given a small amount of fluid to drink. Depending on your individual set of symptoms, you may also receive an intravenous injection of x-ray contrast/dye. Plan on being at [facility] for 30 minutes or long, depending on the type of exam  you are having performed.  If you have any questions regarding your exam or if you need to reschedule, you may call the CT department at [phone] between the hours of 8:00 am and 5:00 pm, Monday-Friday.  ________________________________________________________________________

## 2011-05-28 NOTE — Progress Notes (Signed)
HISTORY OF PRESENT ILLNESS:  [name]. is a 71 y.o. male with MULTIPLE SIGNIFICANT MEDICAL PROBLEMS as listed below. These include oxygen requiring COPD with ongoing tobacco abuse, coronary artery disease with prior myocardial infarction and coronary artery bypass grafting, hypertension, hyperlipidemia, diabetes mellitus, anxiety/depression, and GERD complicated by peptic stricture. He also has a history of chronic abdominal pain and chronic constipation for which he has been seen previously in this office. He also has a history of iron deficiency anemia secondary to [facility] erosions. He was last seen in the office 06/12/2010 regarding abdominal bloating discomfort and constipation. As well recurrent dysphagia. Subsequent abdominal ultrasound was unremarkable. Subsequent upper endoscopy with esophageal dilation was performed on 08/24/2010. He has not been seen since. Chief complaint today is that of mid/epigastric burning and abdominal discomfort. He has had this but he has seen me in the past. However, he states that it is worse. It occurs daily and is exacerbated by meals. It is improved with Pepto-Bismol, belching, and Alka-Seltzer. He has had significant problems with anxiety and anxiety attacks for which she is seeing Dr. Jenny Reichmann, and had his medications adjusted a few weeks ago. He and his wife report a significant decrease in his appetite. He has lost 30 pounds in the past year. He denies back pain. His bowels have been reasonable. No bleeding. Prior colonoscopy in 2008 revealed diverticulosis. Prior CT scan in May of 2010 was unremarkable. He reports recurrence of his dysphagia and desires repeat endoscopy with esophageal dilation. Stools occasionally dark.  REVIEW OF SYSTEMS:  All non-GI ROS negative except for anxiety, back pain, hearing problems, muscle cramps, shortness of breath, insomnia, ankle edema  Past Medical History  Diagnosis Date  . Angina at rest   . COPD (chronic  obstructive pulmonary disease)   . Allergy   . Myocardial infarction   . Diabetes mellitus   . Emphysema of lung   . GERD (gastroesophageal reflux disease)   . Hypertension   . Hyperlipidemia   . Weight loss   . CAD (coronary artery disease)   . Anxiety   . PVD (peripheral vascular disease)   . Contusion     left chest wall  . Dysphagia   . Abdominal pain     generalized  . OA (osteoarthritis)   . Asthma   . Anemia     iron deficiency  . Memory loss   . Insomnia   . DDD (degenerative disc disease), lumbar   . OSA (obstructive sleep apnea)   . Hypersomnia   . Fatigue   . Abdominal  pain, other specified site   . Urinary hesitancy   . Low back pain   . Depression   . Benign prostatic hypertrophy   . Lumbar back pain     chronic  . Spinal stenosis   . Herniated disc   . History of colonic polyps   . Diverticulitis, colon   . Esophageal stricture   . Hx of colonoscopy   . Chronic obstructive pulmonary disease (COPD)   . Rhinitis 11/17/2010  . Hiatal hernia   . Cameron lesion, acute     Past Surgical History  Procedure Date  . Heart bypass   . Bilateral knee replacements   . Hemorrhoid surgery   . Carpal tunnel release   . Shoulder arthroscopy right  . Coronary angioplasty with stent placement     Social History Tyler PH[employer] Sr.  reports that he has been smoking Cigarettes.  He has a  40 pack-year smoking history. He uses smokeless tobacco. He reports that he does not drink alcohol or use illicit drugs.  family history includes Allergies in an unspecified family member; Diabetes in an unspecified family member; Emphysema in his father; Heart disease in an unspecified family member; Hyperlipidemia in an unspecified family member; Hypertension in an unspecified family member; Kidney cancer in his daughter; and Stroke in his father.  There is no history of Colon cancer.  Allergies  Allergen Reactions  . Celecoxib     REACTION: GI upset  . Morphine      REACTION: itching  . Nalbuphine     REACTION: contraindication with oxycontin.  . Prednisone     REACTION: "yeast infections"  . Venlafaxine     REACTION: GI upset       PHYSICAL EXAMINATION: Vital signs: BP 110/70  Pulse 68  Ht 5\' 8"  (1.727 m)  Wt 169 lb 12.8 oz (77.021 kg)  BMI 25.82 kg/m2 General: Well-developed, well-nourished, no acute distress. Chronically ill appearing HEENT: Sclerae are anicteric, conjunctiva pink. Oral mucosa intact Lungs: Clear, distant breath sounds Heart: Regular Abdomen: soft, nontender, nondistended, no obvious ascites, no peritoneal signs, normal bowel sounds. No organomegaly. Extremities: No edema Psychiatric: alert and oriented x3. Cooperative    ASSESSMENT:  #1. Chronic burning and abdominal discomfort. I suspect this is functional, given his history. However, ongoing weight loss is concerning. #2. Significant weight loss. Suspect functional and related to anxiety and chronic medical problems both with associated decreased appetite. However, rule out organic cause, aside from chronic medical problems. #3. GERD with peptic stricture. Now with recurrent dysphagia likely secondary to the same #4. History of iron deficiency anemia secondary to large hiatal hernia and associated Cameron erosions #5.  Multiple significant medical problems including COPD and diabetes mellitus   PLAN:  #1. Upper endoscopy with esophageal dilation.The nature of the procedure, as well as the risks, benefits, and alternatives were carefully and thoroughly reviewed with the patient. Ample time for discussion and questions allowed. The patient understood, was satisfied, and agreed to proceed. The patient is high risk due to his comorbidities. Hold diabetic medications the day of the procedure until oral intake resumed. #2. Contrast-enhanced CT scan of the abdomen and pelvis with fine cuts through the pancreas to evaluate chronic abdominal discomfort and ongoing weight  loss. #3. If upper endoscopy and CT imaging failed to reveal a cause for pain weight loss, suspect functional, and in large part due to problems with anxiety. In this case, continue ongoing evaluation and treatment with his primary provider, Dr. Jenny Reichmann. #4. Continue PPI #5. MiraLax when necessary for constipation #6. Stop smoking

## 2011-05-31 ENCOUNTER — Telehealth: Payer: Self-pay | Admitting: *Deleted

## 2011-05-31 ENCOUNTER — Ambulatory Visit
Admission: RE | Admit: 2011-05-31 | Discharge: 2011-05-31 | Disposition: A | Discharge status: Home / Self Care | Payer: Medicare Other | Source: Ambulatory Visit | Attending: Internal Medicine | Admitting: Internal Medicine

## 2011-05-31 DIAGNOSIS — K449 Diaphragmatic hernia without obstruction or gangrene: Secondary | ICD-10-CM | POA: Diagnosis not present

## 2011-05-31 DIAGNOSIS — R634 Abnormal weight loss: Secondary | ICD-10-CM | POA: Diagnosis not present

## 2011-05-31 DIAGNOSIS — R1013 Epigastric pain: Secondary | ICD-10-CM

## 2011-05-31 DIAGNOSIS — R1310 Dysphagia, unspecified: Secondary | ICD-10-CM

## 2011-05-31 MED ORDER — IOHEXOL 300 MG/ML  SOLN
100.0000 mL | Freq: Once | INTRAMUSCULAR | Status: AC | PRN
  Administered 2011-05-31: 100 mL via INTRAVENOUS

## 2011-05-31 NOTE — Telephone Encounter (Signed)
Patients appointment moved to an earlier available appointment.  Patient to arrive at 1330 for a 1430 appointment.

## 2011-06-03 ENCOUNTER — Encounter: Payer: Medicare Other | Admitting: Internal Medicine

## 2011-06-03 ENCOUNTER — Encounter: Payer: Self-pay | Admitting: Internal Medicine

## 2011-06-03 ENCOUNTER — Ambulatory Visit (AMBULATORY_SURGERY_CENTER): Payer: Medicare Other | Admitting: Internal Medicine

## 2011-06-03 VITALS — BP 108/68 | HR 69 | Temp 98.0°F | Resp 15 | Ht 68.0 in | Wt 169.0 lb

## 2011-06-03 DIAGNOSIS — R1013 Epigastric pain: Secondary | ICD-10-CM

## 2011-06-03 DIAGNOSIS — K219 Gastro-esophageal reflux disease without esophagitis: Secondary | ICD-10-CM

## 2011-06-03 DIAGNOSIS — G4733 Obstructive sleep apnea (adult) (pediatric): Secondary | ICD-10-CM | POA: Diagnosis not present

## 2011-06-03 DIAGNOSIS — I2510 Atherosclerotic heart disease of native coronary artery without angina pectoris: Secondary | ICD-10-CM | POA: Diagnosis not present

## 2011-06-03 DIAGNOSIS — I209 Angina pectoris, unspecified: Secondary | ICD-10-CM | POA: Diagnosis not present

## 2011-06-03 DIAGNOSIS — K222 Esophageal obstruction: Secondary | ICD-10-CM

## 2011-06-03 DIAGNOSIS — R1310 Dysphagia, unspecified: Secondary | ICD-10-CM | POA: Diagnosis not present

## 2011-06-03 DIAGNOSIS — R109 Unspecified abdominal pain: Secondary | ICD-10-CM | POA: Diagnosis not present

## 2011-06-03 DIAGNOSIS — J449 Chronic obstructive pulmonary disease, unspecified: Secondary | ICD-10-CM | POA: Diagnosis not present

## 2011-06-03 DIAGNOSIS — F341 Dysthymic disorder: Secondary | ICD-10-CM | POA: Diagnosis not present

## 2011-06-03 DIAGNOSIS — K2900 Acute gastritis without bleeding: Secondary | ICD-10-CM

## 2011-06-03 LAB — GLUCOSE, CAPILLARY: Glucose-Capillary: 140 mg/dL — ABNORMAL HIGH (ref 70–99)

## 2011-06-03 MED ORDER — DEXTROSE 5 % IV SOLN
INTRAVENOUS | Status: DC
Start: 2011-06-03 — End: 2012-10-19

## 2011-06-03 NOTE — Op Note (Signed)
[facility] [employer]. [place], [place]  [id number]  ENDOSCOPY PROCEDURE REPORT  PATIENT:  [name], [name]  MR#:  [id number] BIRTHDATE:  07/01/1940, 70 yrs. old  GENDER:  male  ENDOSCOPIST:  Docia Chuck. Geri Seminole, MD Referred by:  Office  PROCEDURE DATE:  06/03/2011 PROCEDURE:  EGD with biopsy, 43239, EGD with balloon dilatation - 26mm ASA CLASS:  Class III INDICATIONS:  epigastric pain, dyspepsia, dilation of esophageal stricture  MEDICATIONS:   MAC sedation, administered by CRNA, propofol (Diprivan) 140 mg IV TOPICAL ANESTHETIC:  none  DESCRIPTION OF PROCEDURE:   After the risks benefits and alternatives of the procedure were thoroughly explained, informed consent was obtained.  The LB GIF-H180 [id number] endoscope was introduced through the mouth and advanced to the second portion of the duodenum, without limitations.  The instrument was slowly withdrawn as the mucosa was fully examined. <<PROCEDUREIMAGES>>  A 29mm ring-like stricture was found in the distal esophagus. Mild gastritis (erosions) was found in the antrum.CLO bx taken Otherwise the examination of the stomach and duodenum was normal. Retroflexed views revealed a 4cm hiatal hernia.  THERAPY: TTS BALLOON TO DILATE STRICTURE 16.5 AND 18MM. MILD RESISTANCE. NO HEME. TOLERATE WELL.    The scope was then withdrawn from the patient and the procedure completed.  COMPLICATIONS:  None  ENDOSCOPIC IMPRESSION: 1) Stricture in the distal esophagus - S/P DILATION 2) Mild gastritis in the antrum - S/P CLO BX 3) Otherwise normal examination 4) A hiatal hernia RECOMMENDATIONS: 1) Continue PPI 2) Clear liquids until 5 PM, then soft foods rest of day. Resume prior diet tomorrow. 3) Rx CLO if positive 4) RETURN TO THE CARE OF DR Jenny Reichmann. ABDOMINAL C/O AND WEIGHT LOSS LIKELY FUNCTIONAL (IN LIGHT OF HISTORY AND NEGATIVE GI WORKUP)  ______________________________ Docia Chuck. Geri Seminole, MD  CC:  Biagio Borg, MD; The  Patient  n. eSIGNED:   Docia Chuck. Geri Seminole at 06/03/2011 03:30 PM  Otho Najjar, [id number]

## 2011-06-03 NOTE — Progress Notes (Signed)
Patient did not experience any of the following events: a burn prior to discharge; a fall within the facility; wrong site/side/patient/procedure/implant event; or a hospital transfer or hospital admission upon discharge from the facility. (G8907) Patient did not have preoperative order for IV antibiotic SSI prophylaxis. (G8918)  

## 2011-06-03 NOTE — Patient Instructions (Addendum)
YOU HAD AN ENDOSCOPIC PROCEDURE TODAY AT [facility]: Refer to the procedure report that was given to you for any specific questions about what was found during the examination.  If the procedure report does not answer your questions, please call your gastroenterologist to clarify.  If you requested that your care partner not be given the details of your procedure findings, then the procedure report has been included in a sealed envelope for you to review at your convenience later.  YOU SHOULD EXPECT: Some feelings of bloating in the abdomen. Passage of more gas than usual.  Walking can help get rid of the air that was put into your GI tract during the procedure and reduce the bloating. If you had a lower endoscopy (such as a colonoscopy or flexible sigmoidoscopy) you may notice spotting of blood in your stool or on the toilet paper. If you underwent a bowel prep for your procedure, then you may not have a normal bowel movement for a few days.  DIET   : FOLLOW DILATATION DIET GIVEN TO YOU & AS DISCUSSED   ACTIVITY: Your care partner should take you home directly after the procedure.  You should plan to take it easy, moving slowly for the rest of the day.  You can resume normal activity the day after the procedure however you should NOT DRIVE or use heavy machinery for 24 hours (because of the sedation medicines used during the test).    SYMPTOMS TO REPORT IMMEDIATELY: A gastroenterologist can be reached at any hour.  During normal business hours, 8:30 AM to 5:00 PM Monday through Friday, call [phone].  After hours and on weekends, please call the GI answering service at [phone] who will take a message and have the physician on call contact you.   Following lower endoscopy (colonoscopy or flexible sigmoidoscopy):  Excessive amounts of blood in the stool  Significant tenderness or worsening of abdominal pains  Swelling of the abdomen that is new, acute  Fever of 100F or  higher  Following upper endoscopy (EGD)  Vomiting of blood or coffee ground material  New chest pain or pain under the shoulder blades  Painful or persistently difficult swallowing  New shortness of breath  Fever of 100F or higher  Black, tarry-looking stools  FOLLOW UP: If any biopsies were taken you will be contacted by phone or by letter within the next 1-3 weeks.  Call your gastroenterologist if you have not heard about the biopsies in 3 weeks.  Our staff will call the home number listed on your records the next business day following your procedure to check on you and address any questions or concerns that you may have at that time regarding the information given to you following your procedure. This is a courtesy call and so if there is no answer at the home number and we have not heard from you through the emergency physician on call, we will assume that you have returned to your regular daily activities without incident.  SIGNATURES/CONFIDENTIALITY: You and/or your care partner have signed paperwork which will be entered into your electronic medical record.  These signatures attest to the fact that that the information above on your After Visit Summary has been reviewed and is understood.  Full responsibility of the confidentiality of this discharge information lies with you and/or your care-partner.    Due to dilatation of your esophagus today, drink clear liquids only until 5:00 pm today, then soft foods rest of today .  Resume prior diet tomorrow.   Return to care of Dr. Jenny Reichmann .  INFORMATION ON DILATATION OF ESOPHAGUS , ESOPHAGEAL STRICTURE & GASTRITIS GIVEN TO YOU ALONG WITH A COPY OF SOFT FOODS!

## 2011-06-03 NOTE — Progress Notes (Signed)
CRNA IN CHECKED ON PT PRIOR TO DC . PT FALLS ASLEEP EASILY WHEN NOT TALKING TO OR STIMULATING HIM. CRNA ,DEBRA MERIT SAYS THIS WAS HOW PT WAS PRIOR TO [place] FEELS HE IS BACK TO BASELINE. PT IS ON 02 @ NIGHT . ENCOURAGED WIFE TO HAVE PT WEAR 02 TODAY WHILE ASLEEP. PT STOOD W/ CANE FINE AFTER DRESSED.

## 2011-06-04 ENCOUNTER — Telehealth: Payer: Self-pay | Admitting: *Deleted

## 2011-06-04 NOTE — Telephone Encounter (Signed)
  Follow up Call-  Call back number 06/03/2011 08/24/2010 08/24/2010  Post procedure Call Back phone  # [phone]  Permission to leave phone message Yes - -       No answer and no answering machine to leave a message

## 2011-06-07 HISTORY — PX: EYE SURGERY: SHX253

## 2011-06-10 ENCOUNTER — Telehealth: Payer: Self-pay | Admitting: Internal Medicine

## 2011-06-10 NOTE — Telephone Encounter (Signed)
Pts wife called wanting to know the results of the pathology report. Clo biopsy was negative, pt aware.

## 2011-06-14 ENCOUNTER — Ambulatory Visit: Payer: Medicare Other | Admitting: Internal Medicine

## 2011-06-19 ENCOUNTER — Other Ambulatory Visit: Payer: Self-pay | Admitting: Internal Medicine

## 2011-06-21 ENCOUNTER — Encounter (HOSPITAL_BASED_OUTPATIENT_CLINIC_OR_DEPARTMENT_OTHER): Admission: RE | Payer: Self-pay | Source: Ambulatory Visit

## 2011-06-21 ENCOUNTER — Ambulatory Visit (HOSPITAL_BASED_OUTPATIENT_CLINIC_OR_DEPARTMENT_OTHER): Admission: RE | Admit: 2011-06-21 | Payer: Medicare Other | Source: Ambulatory Visit | Admitting: Ophthalmology

## 2011-06-21 SURGERY — REPAIR STRABISMUS
Anesthesia: General | Laterality: Left

## 2011-07-02 DIAGNOSIS — M545 Low back pain, unspecified: Secondary | ICD-10-CM | POA: Diagnosis not present

## 2011-07-02 DIAGNOSIS — Z79899 Other long term (current) drug therapy: Secondary | ICD-10-CM | POA: Diagnosis not present

## 2011-07-02 DIAGNOSIS — M542 Cervicalgia: Secondary | ICD-10-CM | POA: Diagnosis not present

## 2011-07-02 DIAGNOSIS — G541 Lumbosacral plexus disorders: Secondary | ICD-10-CM | POA: Diagnosis not present

## 2011-07-02 DIAGNOSIS — IMO0002 Reserved for concepts with insufficient information to code with codable children: Secondary | ICD-10-CM | POA: Diagnosis not present

## 2011-07-02 DIAGNOSIS — M5430 Sciatica, unspecified side: Secondary | ICD-10-CM | POA: Diagnosis not present

## 2011-07-17 ENCOUNTER — Other Ambulatory Visit: Payer: Self-pay | Admitting: Internal Medicine

## 2011-07-23 DIAGNOSIS — H519 Unspecified disorder of binocular movement: Secondary | ICD-10-CM | POA: Diagnosis not present

## 2011-07-30 ENCOUNTER — Encounter (HOSPITAL_BASED_OUTPATIENT_CLINIC_OR_DEPARTMENT_OTHER): Payer: Self-pay | Admitting: *Deleted

## 2011-07-30 NOTE — Progress Notes (Signed)
Talked with wife-pt napping-only used o2 at night-they do not travel with a portable o2 tank Doe-no chest congestion-has chest pain aboiut weekly, takes ntg, but has gone to er in yrs. Needs istat dos Will revirew with anesth-did discuss with dr Chriss Driver

## 2011-08-02 ENCOUNTER — Encounter (HOSPITAL_BASED_OUTPATIENT_CLINIC_OR_DEPARTMENT_OTHER): Admission: RE | Payer: Self-pay | Source: Ambulatory Visit

## 2011-08-02 ENCOUNTER — Ambulatory Visit (HOSPITAL_BASED_OUTPATIENT_CLINIC_OR_DEPARTMENT_OTHER): Admission: RE | Admit: 2011-08-02 | Payer: Medicare Other | Source: Ambulatory Visit | Admitting: Ophthalmology

## 2011-08-02 HISTORY — DX: Unspecified hearing loss, unspecified ear: H91.90

## 2011-08-02 SURGERY — REPAIR STRABISMUS
Anesthesia: General | Laterality: Left

## 2011-08-12 ENCOUNTER — Other Ambulatory Visit: Payer: Self-pay | Admitting: Internal Medicine

## 2011-08-13 NOTE — Telephone Encounter (Signed)
Faxed hardcopy to pharmacy. 

## 2011-08-13 NOTE — Telephone Encounter (Signed)
Done hardcopy to robin  

## 2011-08-15 ENCOUNTER — Encounter (HOSPITAL_COMMUNITY): Payer: Self-pay | Admitting: Pharmacy Technician

## 2011-08-21 ENCOUNTER — Encounter (HOSPITAL_COMMUNITY): Payer: Self-pay

## 2011-08-21 ENCOUNTER — Encounter (HOSPITAL_COMMUNITY)
Admission: RE | Admit: 2011-08-21 | Discharge: 2011-08-21 | Disposition: A | Discharge status: Home / Self Care | Payer: Medicare Other | Source: Ambulatory Visit | Attending: Ophthalmology | Admitting: Ophthalmology

## 2011-08-21 DIAGNOSIS — J438 Other emphysema: Secondary | ICD-10-CM | POA: Diagnosis not present

## 2011-08-21 DIAGNOSIS — H5010 Unspecified exotropia: Secondary | ICD-10-CM | POA: Diagnosis not present

## 2011-08-21 DIAGNOSIS — I10 Essential (primary) hypertension: Secondary | ICD-10-CM | POA: Diagnosis not present

## 2011-08-21 DIAGNOSIS — I2510 Atherosclerotic heart disease of native coronary artery without angina pectoris: Secondary | ICD-10-CM | POA: Diagnosis not present

## 2011-08-21 DIAGNOSIS — K219 Gastro-esophageal reflux disease without esophagitis: Secondary | ICD-10-CM | POA: Diagnosis not present

## 2011-08-21 DIAGNOSIS — E119 Type 2 diabetes mellitus without complications: Secondary | ICD-10-CM | POA: Diagnosis not present

## 2011-08-21 LAB — CBC
MCH: 32.1 pg (ref 26.0–34.0)
MCHC: 33.6 g/dL (ref 30.0–36.0)
MCV: 95.5 fL (ref 78.0–100.0)
Platelets: 208 10*3/uL (ref 150–400)
RBC: 3.99 MIL/uL — ABNORMAL LOW (ref 4.22–5.81)
RDW: 13.8 % (ref 11.5–15.5)

## 2011-08-21 LAB — BASIC METABOLIC PANEL
CO2: 26 mEq/L (ref 19–32)
Calcium: 9.6 mg/dL (ref 8.4–10.5)
Creatinine, Ser: 1.04 mg/dL (ref 0.50–1.35)
GFR calc non Af Amer: 72 mL/min — ABNORMAL LOW (ref >90)

## 2011-08-21 LAB — SURGICAL PCR SCREEN: MRSA, PCR: NEGATIVE

## 2011-08-21 NOTE — Pre-Procedure Instructions (Signed)
22 [name] Sr.  08/21/2011   Your procedure is scheduled on:  Thursday, May 23,2013 @7 :30AM.  Report to [place] at 5:30 AM.  Call this number if you have problems the morning of surgery: [phone]   Remember:   Do not eat food:After Midnight.  May have clear liquids: up to 4 Hours before arrival( nothing after 1:30AM).  Clear liquids include soda, tea, black coffee, apple or grape juice, broth.  Take these medicines the morning of surgery with A SIP OF WATER: Albuterol, Xanax or Ativan or Klonopin, Cymbalta, Prilosec, Flonase, Lorcet or Oxycontin.   Do not wear jewelry, make-up or nail polish.  Do not wear lotions, powders, or perfumes. You may wear deodorant.  Do not shave 48 hours prior to surgery. Men may shave face and neck.  Do not bring valuables to the hospital.  Contacts, dentures or bridgework may not be worn into surgery.  Leave suitcase in the car. After surgery it may be brought to your room.  For patients admitted to the hospital, checkout time is 11:00 AM the day of discharge.   Patients discharged the day of surgery will not be allowed to drive home.  Name and phone number of your driver: Tavares Kiyabu, wife, [phone]  Special Instructions: CHG Shower Use Special Wash: 1/2 bottle night before surgery and 1/2 bottle morning of surgery.   Please read over the following fact sheets that you were given: Pain Booklet, Coughing and Deep Breathing and Surgical Site Infection Prevention

## 2011-08-21 NOTE — Progress Notes (Addendum)
Orders received from Dr. Janee Morn office.  Entered into EPIC. Noted that ISTAT was ordered at another encounter per Dr. Chriss Driver.  Not needed due to not being MD at this admission.  Pt does not receive dialysis.  Pt's cardiologist is Dr. Marcello Moores [phone], [phone].  Pt reports having last Stress test in <5 years and not scheduled to see him for F/u for another year.//L. Nasiyah Laverdiere,RN

## 2011-08-21 NOTE — Pre-Procedure Instructions (Signed)
20 NIHAN HOLZKNECHT Sr.  08/21/2011   Your procedure is scheduled on:  Thursday, Aug 29, 2011 @7 :30AM.  Report to [place] at 5:30 AM.  Call this number if you have problems the morning of surgery: [phone]   Remember:   Do not eat food:After Midnight.  May have clear liquids: up to 4 Hours before arrival( nothing after 1:30AM).  Clear liquids include soda, tea, black coffee, apple or grape juice, broth.  Take these medicines the morning of surgery with A SIP OF WATER: Albuterol, Xanax, Klonopin, Aricept, Cymbalta, Flonase, Lorcet(Acetaminophen-Hydrocodone) or Oxycontin or Zanaflex, Prilosec.   Do not wear jewelry, make-up or nail polish.  Do not wear lotions, powders, or perfumes. You may wear deodorant.  Do not shave 48 hours prior to surgery. Men may shave face and neck.  Do not bring valuables to the hospital.  Contacts, dentures or bridgework may not be worn into surgery.  Leave suitcase in the car. After surgery it may be brought to your room.  For patients admitted to the hospital, checkout time is 11:00 AM the day of discharge.   Patients discharged the day of surgery will not be allowed to drive home.  Name and phone number of your driver.  Special Instructions: CHG Shower Use Special Wash: 1/2 bottle night before surgery and 1/2 bottle morning of surgery.   Please read over the following fact sheets that you were given: Pain Booklet, Coughing and Deep Breathing and Surgical Site Infection Prevention

## 2011-08-28 MED ORDER — LACTATED RINGERS IV SOLN: INTRAVENOUS | Status: DC

## 2011-08-29 ENCOUNTER — Ambulatory Visit (HOSPITAL_COMMUNITY): Payer: Medicare Other | Admitting: Anesthesiology

## 2011-08-29 ENCOUNTER — Encounter (HOSPITAL_COMMUNITY): Payer: Self-pay | Admitting: *Deleted

## 2011-08-29 ENCOUNTER — Ambulatory Visit (HOSPITAL_COMMUNITY)
Admission: RE | Admit: 2011-08-29 | Discharge: 2011-08-29 | Disposition: A | Discharge status: Home / Self Care | Payer: Medicare Other | Source: Ambulatory Visit | Attending: Ophthalmology | Admitting: Ophthalmology

## 2011-08-29 ENCOUNTER — Encounter (HOSPITAL_COMMUNITY): Payer: Self-pay | Admitting: Anesthesiology

## 2011-08-29 ENCOUNTER — Encounter (HOSPITAL_COMMUNITY)
Admission: RE | Disposition: A | Discharge status: Home / Self Care | Payer: Self-pay | Source: Ambulatory Visit | Attending: Ophthalmology

## 2011-08-29 DIAGNOSIS — K219 Gastro-esophageal reflux disease without esophagitis: Secondary | ICD-10-CM | POA: Diagnosis not present

## 2011-08-29 DIAGNOSIS — I798 Other disorders of arteries, arterioles and capillaries in diseases classified elsewhere: Secondary | ICD-10-CM | POA: Diagnosis not present

## 2011-08-29 DIAGNOSIS — E119 Type 2 diabetes mellitus without complications: Secondary | ICD-10-CM | POA: Diagnosis not present

## 2011-08-29 DIAGNOSIS — I2510 Atherosclerotic heart disease of native coronary artery without angina pectoris: Secondary | ICD-10-CM | POA: Insufficient documentation

## 2011-08-29 DIAGNOSIS — J438 Other emphysema: Secondary | ICD-10-CM | POA: Insufficient documentation

## 2011-08-29 DIAGNOSIS — I10 Essential (primary) hypertension: Secondary | ICD-10-CM | POA: Insufficient documentation

## 2011-08-29 DIAGNOSIS — H5010 Unspecified exotropia: Secondary | ICD-10-CM | POA: Diagnosis not present

## 2011-08-29 DIAGNOSIS — H519 Unspecified disorder of binocular movement: Secondary | ICD-10-CM | POA: Diagnosis not present

## 2011-08-29 HISTORY — PX: STRABISMUS SURGERY: SHX218

## 2011-08-29 LAB — POCT I-STAT 4, (NA,K, GLUC, HGB,HCT)
Glucose, Bld: 100 mg/dL — ABNORMAL HIGH (ref 70–99)
Potassium: 4.4 mEq/L (ref 3.5–5.1)
Sodium: 142 mEq/L (ref 135–145)

## 2011-08-29 SURGERY — REPAIR STRABISMUS
Anesthesia: General | Site: Eye | Laterality: Left | Wound class: Clean

## 2011-08-29 MED ORDER — PHENYLEPHRINE HCL 2.5 % OP SOLN
OPHTHALMIC | Status: DC | PRN
  Administered 2011-08-29: 1 [drp] via OPHTHALMIC

## 2011-08-29 MED ORDER — MIDAZOLAM HCL 2 MG/2ML IJ SOLN: 1.0000 mg | INTRAMUSCULAR | Status: DC | PRN

## 2011-08-29 MED ORDER — LACTATED RINGERS IV SOLN
INTRAVENOUS | Status: DC | PRN
  Administered 2011-08-29 (×2): via INTRAVENOUS

## 2011-08-29 MED ORDER — LIDOCAINE HCL (CARDIAC) 20 MG/ML IV SOLN
INTRAVENOUS | Status: DC | PRN
  Administered 2011-08-29: 40 mg via INTRAVENOUS

## 2011-08-29 MED ORDER — 0.9 % SODIUM CHLORIDE (POUR BTL) OPTIME
TOPICAL | Status: DC | PRN
  Administered 2011-08-29: 1000 mL

## 2011-08-29 MED ORDER — FENTANYL CITRATE 0.05 MG/ML IJ SOLN: 50.0000 ug | INTRAMUSCULAR | Status: DC | PRN

## 2011-08-29 MED ORDER — TOBRAMYCIN 0.3 % OP OINT
TOPICAL_OINTMENT | OPHTHALMIC | Status: DC | PRN
  Administered 2011-08-29: 1 via OPHTHALMIC

## 2011-08-29 MED ORDER — LORAZEPAM 2 MG/ML IJ SOLN: 1.0000 mg | Freq: Once | INTRAMUSCULAR | Status: DC | PRN

## 2011-08-29 MED ORDER — TOBRAMYCIN-DEXAMETHASONE 0.3-0.1 % OP SUSP: 1.0000 [drp] | Freq: Three times a day (TID) | OPHTHALMIC | Status: AC

## 2011-08-29 MED ORDER — FENTANYL CITRATE 0.05 MG/ML IJ SOLN: 25.0000 ug | INTRAMUSCULAR | Status: DC | PRN

## 2011-08-29 MED ORDER — TOBRAMYCIN-DEXAMETHASONE 0.3-0.1 % OP OINT
TOPICAL_OINTMENT | OPHTHALMIC | Status: DC
  Filled 2011-08-29: qty 3.5

## 2011-08-29 MED ORDER — EPHEDRINE SULFATE 50 MG/ML IJ SOLN
INTRAMUSCULAR | Status: DC | PRN
  Administered 2011-08-29 (×4): 10 mg via INTRAVENOUS

## 2011-08-29 MED ORDER — BSS IO SOLN
INTRAOCULAR | Status: DC | PRN
  Administered 2011-08-29: 15 mL via INTRAOCULAR

## 2011-08-29 MED ORDER — ONDANSETRON HCL 4 MG/2ML IJ SOLN
INTRAMUSCULAR | Status: DC | PRN
  Administered 2011-08-29: 4 mg via INTRAVENOUS

## 2011-08-29 MED ORDER — FENTANYL CITRATE 0.05 MG/ML IJ SOLN
INTRAMUSCULAR | Status: DC | PRN
  Administered 2011-08-29 (×2): 25 ug via INTRAVENOUS

## 2011-08-29 MED ORDER — PHENYLEPHRINE HCL 2.5 % OP SOLN
1.0000 [drp] | OPHTHALMIC | Status: DC
  Filled 2011-08-29: qty 2

## 2011-08-29 MED ORDER — SODIUM CHLORIDE 0.9 % IV SOLN: INTRAVENOUS | Status: DC | PRN

## 2011-08-29 MED ORDER — PROPOFOL 10 MG/ML IV EMUL
INTRAVENOUS | Status: DC | PRN
  Administered 2011-08-29: 120 mg via INTRAVENOUS

## 2011-08-29 SURGICAL SUPPLY — 36 items
APL SRG 3 HI ABS STRL LF PLS (MISCELLANEOUS) ×1
APPLICATOR COTTON TIP 6IN STRL (MISCELLANEOUS) ×2 IMPLANT
APPLICATOR DR MATTHEWS STRL (MISCELLANEOUS) ×2 IMPLANT
BANDAGE CONFORM 3  STR LF (GAUZE/BANDAGES/DRESSINGS) IMPLANT
CLOTH BEACON ORANGE TIMEOUT ST (SAFETY) ×2 IMPLANT
CORDS BIPOLAR (ELECTRODE) IMPLANT
COVER SURGICAL LIGHT HANDLE (MISCELLANEOUS) ×2 IMPLANT
DRAPE ORTHO SPLIT 77X108 STRL (DRAPES) ×2
DRAPE POUCH INSTRU U-SHP 10X18 (DRAPES) ×1 IMPLANT
DRAPE SURG 17X23 STRL (DRAPES) ×4 IMPLANT
DRAPE SURG ORHT 6 SPLT 77X108 (DRAPES) ×1 IMPLANT
GLOVE BIO SURGEON STRL SZ7.5 (GLOVE) ×4 IMPLANT
GLOVE BIOGEL M STRL SZ7.5 (GLOVE) ×2 IMPLANT
GLOVE BIOGEL PI IND STRL 8 (GLOVE) ×1 IMPLANT
GLOVE BIOGEL PI INDICATOR 8 (GLOVE) ×1
GLOVE ECLIPSE 6.5 STRL STRAW (GLOVE) ×3 IMPLANT
GLOVE SS BIOGEL STRL SZ 6.5 (GLOVE) IMPLANT
GLOVE SUPERSENSE BIOGEL SZ 6.5 (GLOVE) ×1
GOWN STRL NON-REIN LRG LVL3 (GOWN DISPOSABLE) ×2 IMPLANT
KIT ROOM TURNOVER OR (KITS) ×2 IMPLANT
MARKER SKIN DUAL TIP RULER LAB (MISCELLANEOUS) ×2 IMPLANT
NEEDLE 27GAX1X1/2 (NEEDLE) IMPLANT
NS IRRIG 1000ML POUR BTL (IV SOLUTION) ×2 IMPLANT
PACK CATARACT CUSTOM (CUSTOM PROCEDURE TRAY) ×2 IMPLANT
PAD ARMBOARD 7.5X6 YLW CONV (MISCELLANEOUS) ×4 IMPLANT
STRIP CLOSURE SKIN 1/2X4 (GAUZE/BANDAGES/DRESSINGS) ×1 IMPLANT
SUT MERSILENE 5 0 RD 1 DA (SUTURE) IMPLANT
SUT PLAIN 6 0 TG1408 (SUTURE) ×1 IMPLANT
SUT SILK 6 0 G 6 (SUTURE) IMPLANT
SUT VICRYL 6 0 S 14 UNDY (SUTURE) IMPLANT
SUT VICRYL 6 0 S 28 (SUTURE) IMPLANT
SUT VICRYL 6 0 UNDY PS 6 (SUTURE) IMPLANT
SUT VICRYL ABS 6-0 S29 18IN (SUTURE) ×1 IMPLANT
TOWEL OR 17X24 6PK STRL BLUE (TOWEL DISPOSABLE) ×2 IMPLANT
WATER STERILE IRR 1000ML POUR (IV SOLUTION) ×2 IMPLANT
WIPE INSTRUMENT VISIWIPE 73X73 (MISCELLANEOUS) ×2 IMPLANT

## 2011-08-29 NOTE — Transfer of Care (Signed)
Immediate Anesthesia Transfer of Care Note  Patient: Tyler DEW Sr.  Procedure(s) Performed: Procedure(s) (LRB): REPAIR STRABISMUS (Left)  Patient Location: PACU  Anesthesia Type: General  Level of Consciousness: awake and patient cooperative  Airway & Oxygen Therapy: Patient Spontanous Breathing and Patient connected to face mask oxygen  Post-op Assessment: Report given to PACU RN  Post vital signs:   Complications: No apparent anesthesia complications

## 2011-08-29 NOTE — H&P (View-Only) (Signed)
Talked with wife-pt napping-only used o2 at night-they do not travel with a portable o2 tank Doe-no chest congestion-has chest pain aboiut weekly, takes ntg, but has gone to er in yrs. Needs istat dos Will revirew with anesth-did discuss with dr Chriss Driver

## 2011-08-29 NOTE — Discharge Instructions (Signed)
Diet: Clear liquids, advance to soft foods then regular diet as tolerated.  Pain control:   1)  Ibuprofen 600 mg by mouth every 6-8 hours as needed for pain  2)  Continue your current pain management medications   Eye medications:  Tobradex or Zylet eye drops, one drop in the operated eye(s) 3 times a day for 10 days.  If the surgical center also gave you a tube of Tobradex or Zylet ointment to take home, you may put 1/2 inch of this in the operated eye(s) at bedtime, in addition to the drops.  Activity: No swimming for 1 week.  It is OK to let water run over the face and eyes while showering or taking a bath, even during the first week.  No other restriction on exercise or activity.  Call Dr. Janee Morn office [phone] with any problems or concerns.

## 2011-08-29 NOTE — Op Note (Signed)
08/29/2011  9:19 AM  PATIENT:  Tyler Napoleon Sr.  71 y.o. male  PRE-OPERATIVE DIAGNOSIS:  Exotropia      POST-OPERATIVE DIAGNOSIS:  Exotropia     PROCEDURE:  Lateral rectus muscle recession 6.0 mm left eye, medial rectus muscle resection 7.0 mm left eye  SURGEON:  Lorne Skeens.Annamaria Boots, M.D.   ANESTHESIA:   general  COMPLICATIONS:None  DESCRIPTION OF PROCEDURE: The patient was taken to the operating room where He was identified by me. General anesthesia was induced without difficulty after placement of appropriate monitors. The patient was prepped and draped in standard sterile fashion. A lid speculum was placed in the left eye.  Through an inferotemporal fornix incision through conjunctiva and Tenon's fascia, the left lateral rectus muscle was engaged on a series of muscle hooks and cleared of its fascial attachments. The tendon was secured with a double-armed 6-0 Vicryl suture with a double locking bite at each border of the muscle, 1 mm from the insertion. The muscle was disinserted, and was reattached to sclera at a measured distance of 6.0 millimeters posterior to the original insertion, using direct scleral passes in crossed swords fashion.  The suture ends were tied securely after the position of the muscle had been checked and found to be accurate. Conjunctiva was closed with 2 6-0 plain gut sutures.  Fornix incision through conjunctiva and Tenon fascia, the left medial rectus muscle was engaged on a series of muscle hooks and carefully cleared of its fascial attachments released 15 mm posterior to the insertion. The muscle was spread between 2 self-retaining hooks. A 2 mm bite was taken of the center of the muscle belly at a measured distance of 7.0 mm posterior to the insertion, and a knot was tied securely at this location. The needle at each end of the double-armed suture was passed from the center of the muscle belly to the periphery, parallel to and 7.0 mm posterior to the insertion.  A double locking bite was placed at each border of the muscle. A resection clamp was placed on the muscle just anterior to the sutures. The muscle was disinserted. Each pole suture was passed posteriorly to anteriorly to the corresponding end of the muscle stump, then anteriorly to posteriorly near the center of the stump, then posteriorly to anteriorly through the center of the muscle belly, just posterior to the previously placed knot. The muscle was drawn up to the level of the original insertion, and all slack was removed before the suture ends were tied securely. The clamp was removed. The portion of the muscle anterior to the sutures was carefully excised. Conjunctiva was closed with 2 6-0 plain gut sutures.     TobraDex ointment was placed in the eye. The patient was awakened without difficulty and taken to the recovery room in stable condition, having suffered no intraoperative or immediate postoperative complications.  Lorne Skeens. Astraea Gaughran M.D.    PATIENT DISPOSITION:  PACU - hemodynamically stable.

## 2011-08-29 NOTE — Anesthesia Postprocedure Evaluation (Signed)
  Anesthesia Post-op Note  Patient: Tyler HAZEL Sr.  Procedure(s) Performed: Procedure(s) (LRB): REPAIR STRABISMUS (Left)  Patient Location: PACU  Anesthesia Type: General  Level of Consciousness: awake and alert   Airway and Oxygen Therapy: Patient Spontanous Breathing  Post-op Pain: mild   Post-op Assessment: Post-op Vital signs reviewed, Patient's Cardiovascular Status Stable, Respiratory Function Stable, Patent Airway, No signs of Nausea or vomiting and Pain level controlled  Post-op Vital Signs: stable  Complications: No apparent anesthesia complications

## 2011-08-29 NOTE — H&P (Signed)
  Date of examination:  07-23-11  Indication for surgery: 71 yo man with exotropia causing diplopia, not satisfactorily compensated with prism, admitted for eye muschle surgery to straighten the eyes  and relieve diplopia  Pertinent past medical history:  Past Medical History  Diagnosis Date  . Angina at rest   . COPD (chronic obstructive pulmonary disease)   . Allergy   . Myocardial infarction   . Diabetes mellitus   . Emphysema of lung   . GERD (gastroesophageal reflux disease)   . Hypertension   . Hyperlipidemia   . Weight loss   . CAD (coronary artery disease)   . Anxiety   . PVD (peripheral vascular disease)   . Contusion     left chest wall  . Dysphagia   . Abdominal pain     generalized  . OA (osteoarthritis)   . Asthma   . Anemia     iron deficiency  . Memory loss   . Insomnia   . DDD (degenerative disc disease), lumbar   . OSA (obstructive sleep apnea)   . Hypersomnia   . Fatigue   . Abdominal  pain, other specified site   . Urinary hesitancy   . Low back pain   . Depression   . Benign prostatic hypertrophy   . Lumbar back pain     chronic  . Spinal stenosis   . Herniated disc   . History of colonic polyps   . Diverticulitis, colon   . Esophageal stricture   . Hx of colonoscopy   . Chronic obstructive pulmonary disease (COPD)   . Rhinitis 11/17/2010  . Hiatal hernia   . Cameron lesion, acute   . HOH (hard of hearing)     Pertinent ocular history:  Tried fresnel prism, pt found unsatisfactory  Pertinent family history:  Family History  Problem Relation Age of Onset  . Stroke Father   . Emphysema Father   . Heart disease    . Hypertension    . Hyperlipidemia    . Kidney cancer Daughter   . Allergies    . Colon cancer Neg Hx   . Diabetes      General:  Elderly appearing patient in no distress.    Eyes:    Acuity cc  [place]  OD 20/30  OS 20/30  External: Within normal limits     Anterior segment: Within normal limits   Except mod NS  ou  Motility:   Cc XT = 32. XT' = 50   Refraction:  Low minus OU  Heart: Regular rate and rhythm without murmur     Lungs: few rales     Abdomen: Soft, nontender, normal bowel sounds     Impression:XT, CI type  Plan: Left lateral rectus muscle recession and left medial rectus muscle resection  Ona Rathert O

## 2011-08-29 NOTE — Preoperative (Signed)
Beta Blockers   Reason not to administer Beta Blockers:Not Applicable 

## 2011-08-29 NOTE — Interval H&P Note (Signed)
History and Physical Interval Note:  08/29/2011 7:36 AM  Tyler Napoleon Sr.  has presented today for surgery, with the diagnosis of 378.83  The various methods of treatment have been discussed with the patient and family. After consideration of risks, benefits and other options for treatment, the patient has consented to  Procedure(s) (LRB): REPAIR STRABISMUS (Left) as a surgical intervention .  The patients' history has been reviewed, patient examined, no change in status, stable for surgery.  I have reviewed the patients' chart and labs.  Questions were answered to the patient's satisfaction.     Derry Skill

## 2011-08-29 NOTE — Anesthesia Preprocedure Evaluation (Addendum)
Anesthesia Evaluation  Patient identified by MRN, date of birth, ID band Patient awake    Reviewed: Allergy & Precautions, H&P , NPO status , Patient's Chart, lab work & pertinent test results  Airway Mallampati: I TM Distance: >3 FB Neck ROM: Full    Dental   Pulmonary asthma , sleep apnea , COPD   Pulmonary exam normal       Cardiovascular hypertension, + angina + CAD and + Past MI     Neuro/Psych Anxiety Depression  Neuromuscular disease    GI/Hepatic hiatal hernia, GERD-  ,  Endo/Other  Diabetes mellitus-  Renal/GU      Musculoskeletal   Abdominal   Peds  Hematology   Anesthesia Other Findings   Reproductive/Obstetrics                           Anesthesia Physical Anesthesia Plan  ASA: III  Anesthesia Plan: General   Post-op Pain Management:    Induction: Intravenous  Airway Management Planned: LMA  Additional Equipment:   Intra-op Plan:   Post-operative Plan: Extubation in OR  Informed Consent: I have reviewed the patients History and Physical, chart, labs and discussed the procedure including the risks, benefits and alternatives for the proposed anesthesia with the patient or authorized representative who has indicated his/her understanding and acceptance.   Dental advisory given  Plan Discussed with: CRNA, Surgeon and Anesthesiologist  Anesthesia Plan Comments:        Anesthesia Quick Evaluation

## 2011-08-29 NOTE — Anesthesia Procedure Notes (Signed)
Procedure Name: LMA Insertion Date/Time: 08/29/2011 7:51 AM Performed by: Jon Gills A Pre-anesthesia Checklist: Patient identified, Emergency Drugs available, Suction available and Patient being monitored Patient Re-evaluated:Patient Re-evaluated prior to inductionOxygen Delivery Method: Circle system utilized Preoxygenation: Pre-oxygenation with 100% oxygen Intubation Type: IV induction LMA: LMA flexible inserted LMA Size: 5.0 Number of attempts: 3 Placement Confirmation: positive ETCO2 and breath sounds checked- equal and bilateral Tube secured with: Tape Dental Injury: Teeth and Oropharynx as per pre-operative assessment

## 2011-08-29 NOTE — Brief Op Note (Signed)
08/29/2011  9:18 AM  PATIENT:  Tyler Napoleon Sr.  71 y.o. male  PRE-OPERATIVE DIAGNOSIS:  Exotropia  POST-OPERATIVE DIAGNOSIS:  378.83  PROCEDURE:  Procedure(s) (LRB): REPAIR STRABISMUS (Left)  SURGEON:  Surgeon(s) and Role:    * Derry Skill, MD - Primary  PHYSICIAN ASSISTANT:   ASSISTANTS: none   ANESTHESIA:   general  EBL:  Total I/O In: 1000 [I.V.:1000] Out: -   BLOOD ADMINISTERED:none  DRAINS: none   LOCAL MEDICATIONS USED:  NONE  SPECIMEN:  No Specimen  DISPOSITION OF SPECIMEN:  N/A  COUNTS:  YES  TOURNIQUET:  * No tourniquets in log *  DICTATION: .Note written in EPIC  PLAN OF CARE: Discharge to home after PACU  PATIENT DISPOSITION:  PACU - hemodynamically stable.   Delay start of Pharmacological VTE agent (>24hrs) due to surgical blood loss or risk of bleeding: not applicable

## 2011-08-29 NOTE — Progress Notes (Signed)
Dr Chriss Driver aware pt has BP of 84/49.   States this is ok.  No orders received.  Will monitor.

## 2011-09-03 ENCOUNTER — Encounter (HOSPITAL_COMMUNITY): Payer: Self-pay | Admitting: Ophthalmology

## 2011-09-21 ENCOUNTER — Other Ambulatory Visit: Payer: Self-pay | Admitting: Internal Medicine

## 2011-09-23 MED ORDER — ALPRAZOLAM 0.25 MG PO TABS: 0.2500 mg | ORAL_TABLET | Freq: Two times a day (BID) | ORAL | Status: AC | PRN

## 2011-09-23 NOTE — Telephone Encounter (Signed)
Faxed hardcopy to pharmacy. 

## 2011-09-23 NOTE — Telephone Encounter (Signed)
Prior rx was for xanax qhs only (not tid prn)  Xanax qhs prn refilled - Done hardcopy to robin

## 2011-09-27 DIAGNOSIS — G541 Lumbosacral plexus disorders: Secondary | ICD-10-CM | POA: Diagnosis not present

## 2011-09-27 DIAGNOSIS — M545 Low back pain, unspecified: Secondary | ICD-10-CM | POA: Diagnosis not present

## 2011-09-27 DIAGNOSIS — M542 Cervicalgia: Secondary | ICD-10-CM | POA: Diagnosis not present

## 2011-09-27 DIAGNOSIS — M5430 Sciatica, unspecified side: Secondary | ICD-10-CM | POA: Diagnosis not present

## 2011-09-27 DIAGNOSIS — IMO0002 Reserved for concepts with insufficient information to code with codable children: Secondary | ICD-10-CM | POA: Diagnosis not present

## 2011-09-27 DIAGNOSIS — Z79899 Other long term (current) drug therapy: Secondary | ICD-10-CM | POA: Diagnosis not present

## 2011-10-08 ENCOUNTER — Other Ambulatory Visit: Payer: Self-pay | Admitting: Internal Medicine

## 2011-10-08 NOTE — Telephone Encounter (Signed)
Faxed hardcopy to pharmacy. 

## 2011-10-08 NOTE — Telephone Encounter (Signed)
Done hardcopy to robin  

## 2011-11-01 ENCOUNTER — Other Ambulatory Visit: Payer: Self-pay | Admitting: Internal Medicine

## 2011-11-11 ENCOUNTER — Other Ambulatory Visit: Payer: Self-pay | Admitting: Internal Medicine

## 2011-11-17 ENCOUNTER — Other Ambulatory Visit: Payer: Self-pay | Admitting: Internal Medicine

## 2011-11-26 ENCOUNTER — Telehealth: Payer: Self-pay | Admitting: Internal Medicine

## 2011-11-26 ENCOUNTER — Encounter: Payer: Self-pay | Admitting: Internal Medicine

## 2011-11-26 ENCOUNTER — Other Ambulatory Visit (INDEPENDENT_AMBULATORY_CARE_PROVIDER_SITE_OTHER): Payer: Medicare Other

## 2011-11-26 ENCOUNTER — Ambulatory Visit (INDEPENDENT_AMBULATORY_CARE_PROVIDER_SITE_OTHER)
Admission: RE | Admit: 2011-11-26 | Discharge: 2011-11-26 | Disposition: A | Discharge status: Home / Self Care | Payer: Medicare Other | Source: Ambulatory Visit | Attending: Internal Medicine | Admitting: Internal Medicine

## 2011-11-26 ENCOUNTER — Ambulatory Visit (INDEPENDENT_AMBULATORY_CARE_PROVIDER_SITE_OTHER): Payer: Medicare Other | Admitting: Internal Medicine

## 2011-11-26 VITALS — BP 102/68 | HR 94 | Temp 98.1°F | Ht 68.0 in | Wt 164.2 lb

## 2011-11-26 DIAGNOSIS — R059 Cough, unspecified: Secondary | ICD-10-CM | POA: Insufficient documentation

## 2011-11-26 DIAGNOSIS — I10 Essential (primary) hypertension: Secondary | ICD-10-CM | POA: Diagnosis not present

## 2011-11-26 DIAGNOSIS — J449 Chronic obstructive pulmonary disease, unspecified: Secondary | ICD-10-CM | POA: Diagnosis not present

## 2011-11-26 DIAGNOSIS — J441 Chronic obstructive pulmonary disease with (acute) exacerbation: Secondary | ICD-10-CM | POA: Insufficient documentation

## 2011-11-26 DIAGNOSIS — J189 Pneumonia, unspecified organism: Secondary | ICD-10-CM | POA: Diagnosis not present

## 2011-11-26 DIAGNOSIS — R05 Cough: Secondary | ICD-10-CM | POA: Diagnosis not present

## 2011-11-26 LAB — URINALYSIS, ROUTINE W REFLEX MICROSCOPIC
Nitrite: NEGATIVE
Specific Gravity, Urine: 1.025 (ref 1.000–1.030)
Total Protein, Urine: 30
pH: 5.5 (ref 5.0–8.0)

## 2011-11-26 MED ORDER — CEFTRIAXONE SODIUM 1 G IJ SOLR
1.0000 g | Freq: Once | INTRAMUSCULAR | Status: AC
  Administered 2011-11-26: 1 g via INTRAMUSCULAR

## 2011-11-26 MED ORDER — LEVOFLOXACIN 250 MG PO TABS: 250.0000 mg | ORAL_TABLET | Freq: Every day | ORAL | Status: AC

## 2011-11-26 MED ORDER — METHYLPREDNISOLONE ACETATE 80 MG/ML IJ SUSP
120.0000 mg | Freq: Once | INTRAMUSCULAR | Status: AC
  Administered 2011-11-26: 120 mg via INTRAMUSCULAR

## 2011-11-26 MED ORDER — PREDNISONE 10 MG PO TABS: ORAL_TABLET | ORAL | Status: DC

## 2011-11-26 NOTE — Assessment & Plan Note (Signed)
For depomedrol IM, and predpack asd,  to f/u any worsening symptoms or concerns

## 2011-11-26 NOTE — Assessment & Plan Note (Signed)
stable overall by hx and exam, most recent data reviewed with pt, and pt to continue medical treatment as before BP Readings from Last 3 Encounters:  11/26/11 102/68  08/29/11 96/53  08/29/11 96/53

## 2011-11-26 NOTE — Telephone Encounter (Signed)
Per Emergent Line: Caller: Marilyn/Spouse; Patient Name: [name]; PCP: Cathlean Cower; Best Callback Phone Number: [phone]; Reason for call: Caller reports this gentleman is weak and not eating. Onset of symptoms appx 48 hrs ago. Also having some problems with anxiety despite meds as directed. RN reviewed recent office visits in EPIC, which noted decreased appetite and fatigue. Last office note for Dr Jenny Reichmann, 04/17/2011. [name] has multiple medical problems and takes numerous meds. Caller reports he is sleeping a lot. Dramatic decline for this gentleman in past 2-3 days. [name] takes 7-8 80 mg Oxycontin/day for back pain. He will take fluids and is voiding. He ate nothing on Sunday 8/18 or Monday 8/19 and has just eaten a piece of toast this afternoon.  Per Fatigue Protocol, Geriatric person and recent onset of decreased ability to carry out ADL's, see in 24 hrs Disposition. Appt offered and scheduled for this afternoon, Tues 8/20 at 16:15 with Dr Jenny Reichmann. Caller is agreeable.

## 2011-11-26 NOTE — Progress Notes (Signed)
Subjective:    Patient ID: Tyler Oh., male    DOB: [birth year], 71 y.o.   MRN: [id number]  HPI  Here with wife and seems rather stoic today often stating the direct opposite of hx per wife, with marked downturn in the past 3 days, suddenly decreased appetite, minor constipation, prefers sleeping in recliner rather than bed for unclear reason, denies fever, has occas ST off and on, ? Increased cough but very vague about this, no increased sob/doe but seems possible per wife who notes general weakness and malaise, has to hold on to furniture and walls to get about room to room, no falls, has occasional HA's but denies any worsening, ? Worse depressed recently per wife but pt denies, and some gradual wt loss recently as well.  Sleeping more during the day in the 3 days while sitting in chairs.   Past Medical History  Diagnosis Date  . Angina at rest   . COPD (chronic obstructive pulmonary disease)   . Allergy   . Myocardial infarction   . Diabetes mellitus   . Emphysema of lung   . GERD (gastroesophageal reflux disease)   . Hypertension   . Hyperlipidemia   . Weight loss   . CAD (coronary artery disease)   . Anxiety   . PVD (peripheral vascular disease)   . Contusion     left chest wall  . Dysphagia   . Abdominal pain     generalized  . OA (osteoarthritis)   . Asthma   . Anemia     iron deficiency  . Memory loss   . Insomnia   . DDD (degenerative disc disease), lumbar   . OSA (obstructive sleep apnea)   . Hypersomnia   . Fatigue   . Abdominal  pain, other specified site   . Urinary hesitancy   . Low back pain   . Depression   . Benign prostatic hypertrophy   . Lumbar back pain     chronic  . Spinal stenosis   . Herniated disc   . History of colonic polyps   . Diverticulitis, colon   . Esophageal stricture   . Hx of colonoscopy   . Chronic obstructive pulmonary disease (COPD)   . Rhinitis 11/17/2010  . Hiatal hernia   . Cameron lesion, acute   . HOH (hard of  hearing)    Past Surgical History  Procedure Date  . Heart bypass   . Bilateral knee replacements   . Hemorrhoid surgery   . Carpal tunnel release   . Shoulder arthroscopy right  . Coronary artery bypass graft 09/1997    double bypss  . Coronary angioplasty with stent placement 2/99  . Strabismus surgery 08/29/2011    Procedure: REPAIR STRABISMUS;  Surgeon: Derry Skill, MD;  Location: [place];  Service: Ophthalmology;  Laterality: Left;    reports that he has been smoking Cigarettes.  He has a 40 pack-year smoking history. He uses smokeless tobacco. He reports that he does not drink alcohol or use illicit drugs. family history includes Allergies in an unspecified family member; Diabetes in an unspecified family member; Emphysema in his father; Heart disease in an unspecified family member; Hyperlipidemia in an unspecified family member; Hypertension in an unspecified family member; Kidney cancer in his daughter; and Stroke in his father.  There is no history of Colon cancer. Allergies  Allergen Reactions  . Celecoxib     REACTION: GI upset  . Morphine     REACTION:  itching  . Nalbuphine     REACTION: contraindication with oxycontin.  . Prednisone     REACTION: "yeast infections"  . Venlafaxine     REACTION: GI upset   Current Outpatient Prescriptions on File Prior to Visit  Medication Sig Dispense Refill  . albuterol (PROVENTIL HFA;VENTOLIN HFA) 108 (90 BASE) MCG/ACT inhaler Inhale 2 puffs into the lungs every 4 (four) hours as needed. For shortness of breath      . ALPRAZolam (XANAX) 0.25 MG tablet Take 0.25 mg by mouth at bedtime as needed. For anxiety      . Aspirin (ADULT ASPIRIN LOW STRENGTH) 81 MG EC tablet Take 81 mg by mouth daily.       . clonazePAM (KLONOPIN) 0.5 MG tablet take 1 tablet by mouth three times a day if needed for anxiety  90 tablet  3  . CYMBALTA 60 MG capsule take 1 capsule by mouth once daily  30 capsule  11  . docusate sodium (COLACE) 100 MG capsule  Take 200 mg by mouth daily.       Marland Kitchen donepezil (ARICEPT) 5 MG tablet take 1 take tablet by mouth at bedtime  30 tablet  6  . ferrous sulfate 325 (65 FE) MG EC tablet Take 325 mg by mouth daily.       . fluticasone (FLONASE) 50 MCG/ACT nasal spray Place 2 sprays into the nose daily.      Marland Kitchen glipiZIDE (GLUCOTROL XL) 2.5 MG 24 hr tablet Take 2.5 mg by mouth daily as needed. 1 tab by mouth per day only for blood sugar more than 180      . glipiZIDE (GLUCOTROL XL) 5 MG 24 hr tablet Take 5 mg by mouth daily as needed. 1 tab by mouth daily prn blood sugar > 180      . HYDROcodone-acetaminophen (LORCET) 10-650 MG per tablet Take 1 tablet by mouth every 6 (six) hours as needed. For pain      . lisinopril (PRINIVIL,ZESTRIL) 20 MG tablet Take 20 mg by mouth daily.      Marland Kitchen LORazepam (ATIVAN) 1 MG tablet Take 0.5-1 mg by mouth 2 (two) times daily as needed. For anxiety      . metFORMIN (GLUCOPHAGE) 500 MG tablet Take 1,000 mg by mouth 2 (two) times daily with a meal. 2 tabs by mouth twice per day      . Multiple Vitamin (MULTIVITAMIN) capsule Take 1 capsule by mouth daily.       . nitroGLYCERIN (NITROSTAT) 0.4 MG SL tablet Place 0.4 mg under the tongue every 5 (five) minutes as needed. For chest pain      . omeprazole (PRILOSEC) 20 MG capsule Take 20 mg by mouth daily.       Marland Kitchen oxyCODONE (OXYCONTIN) 80 MG 12 hr tablet Take 320 mg by mouth every 12 (twelve) hours. Taking 4 tabs twice daily      . pravastatin (PRAVACHOL) 80 MG tablet Take 80 mg by mouth daily.      Marland Kitchen tiZANidine (ZANAFLEX) 4 MG tablet Take 4-8 mg by mouth every 6 (six) hours as needed. Muscle spasms      . tiZANidine (ZANAFLEX) 4 MG tablet take 1 to 2 tablets by mouth every 6 hours if needed  120 tablet  2  . zolpidem (AMBIEN) 10 MG tablet Take 10 mg by mouth at bedtime as needed. For sleep       Current Facility-Administered Medications on File Prior to Visit  Medication Dose Route Frequency Provider  Last Rate Last Dose  . dextrose 5 % solution    Intravenous Continuous Irene Shipper, MD       Review of Systems Review of Systems  Constitutional: Negative for diaphoresis  HENT: Negative for tinnitus.   Eyes: Negative for photophobia and visual disturbance.  Respiratory: Negative for choking and stridor.   Gastrointestinal: Negative for vomiting and blood in stool.  Genitourinary: Negative for hematuria and decreased urine volume.  Musculoskeletal: Negative for gait problem.  Skin: Negative for color change and wound.  Neurological: Negative for tremors and numbness.     Objective:   Physical Exam BP 102/68  Pulse 94  Temp 98.1 F (36.7 C) (Oral)  Ht 5\' 8"  (1.727 m)  Wt 164 lb 4 oz (74.503 kg)  BMI 24.97 kg/m2  SpO2 90% Physical Exam  VS noted, fatigued, ill appearing, general weak Constitutional: Pt appears well-developed and well-nourished.  HENT: Head: Normocephalic.  Right Ear: External ear normal.  Left Ear: External ear normal.  Eyes: Conjunctivae and EOM are normal. Pupils are equal, round, and reactive to light.  Neck: Normal range of motion. Neck supple.  Cardiovascular: Normal rate and regular rhythm.   Pulmonary/Chest: Effort normal and breath sounds decreased with few wheeze bilat Abd:  Soft, NT, non-distended, + BS Neurological: Pt is alert. Motor/dtr intact Skin: Skin is warm. No erythema. No rash No acute joint swelling, tenderness.     Assessment & Plan:

## 2011-11-26 NOTE — Patient Instructions (Addendum)
You had the antibiotic shot today (rocephin) You had the steroid shot today  (depomedrol) Take all new medications as prescribed - the levaquin (antibiotic) and prednisone Continue all other medications as before Please have the pharmacy call with any refills you may need. Please go to XRAY in the Basement for the x-ray test Please go to LAB in the Basement for the blood and/or urine tests to be done today You will be contacted by phone if any changes need to be made immediately.  Otherwise, you will receive a letter about your results with an explanation.

## 2011-11-26 NOTE — Assessment & Plan Note (Addendum)
Very difficult hx and not clear this is clincially signficant, but clearly has copd exac and given his acute decompenstation o/w I have high suspecion for CAP - for rocephin IM now, po levaquin, and cxr  Note:  Time for pt hx, exam , review of record with pt and wife, determination of diagnoses, office tx, and plan for further eval and tx is > 40 min

## 2011-11-27 ENCOUNTER — Encounter: Payer: Self-pay | Admitting: Internal Medicine

## 2011-11-27 LAB — BASIC METABOLIC PANEL
BUN: 40 mg/dL — ABNORMAL HIGH (ref 6–23)
CO2: 27 mEq/L (ref 19–32)
Calcium: 8.5 mg/dL (ref 8.4–10.5)
Chloride: 96 mEq/L (ref 96–112)
Creatinine, Ser: 1.3 mg/dL (ref 0.4–1.5)
GFR: 59.8 mL/min — ABNORMAL LOW (ref >60.00)
Glucose, Bld: 139 mg/dL — ABNORMAL HIGH (ref 70–99)
Potassium: 4.2 mEq/L (ref 3.5–5.1)
Sodium: 131 mEq/L — ABNORMAL LOW (ref 135–145)

## 2011-11-27 LAB — CBC WITH DIFFERENTIAL/PLATELET
Basophils Absolute: 0 10*3/uL (ref 0.0–0.1)
Basophils Relative: 0.1 % (ref 0.0–3.0)
Eosinophils Absolute: 0 10*3/uL (ref 0.0–0.7)
Eosinophils Relative: 0 % (ref 0.0–5.0)
HCT: 38.7 % — ABNORMAL LOW (ref 39.0–52.0)
Hemoglobin: 12.8 g/dL — ABNORMAL LOW (ref 13.0–17.0)
Lymphocytes Relative: 4.9 % — ABNORMAL LOW (ref 12.0–46.0)
Lymphs Abs: 0.8 10*3/uL (ref 0.7–4.0)
MCHC: 33 g/dL (ref 30.0–36.0)
MCV: 98.6 fl (ref 78.0–100.0)
Monocytes Absolute: 0.7 10*3/uL (ref 0.1–1.0)
Monocytes Relative: 4.1 % (ref 3.0–12.0)
Neutro Abs: 15.7 10*3/uL — ABNORMAL HIGH (ref 1.4–7.7)
Neutrophils Relative %: 90.9 % — ABNORMAL HIGH (ref 43.0–77.0)
Platelets: 196 10*3/uL (ref 150.0–400.0)
RBC: 3.92 Mil/uL — ABNORMAL LOW (ref 4.22–5.81)
RDW: 14.1 % (ref 11.5–14.6)
WBC: 17.3 10*3/uL — ABNORMAL HIGH (ref 4.5–10.5)

## 2011-11-27 LAB — HEPATIC FUNCTION PANEL
ALT: 16 U/L (ref 0–53)
AST: 21 U/L (ref 0–37)
Albumin: 3.2 g/dL — ABNORMAL LOW (ref 3.5–5.2)

## 2011-12-13 ENCOUNTER — Telehealth: Payer: Self-pay | Admitting: Internal Medicine

## 2011-12-13 NOTE — Telephone Encounter (Signed)
Aug 2013 cxr report on epic reviewed  There is no definite mass on the xray  The report mentions that a followup cxr should be considered to make sure the "infiltrate" (haziness) has gone away  We could repeat the cxr at his next visit; if not resolved, a CT could be done to see if there might be a mass  Again, there is no current concern for a definite mass on the august 2013 cxr

## 2011-12-13 NOTE — Telephone Encounter (Signed)
Pt wife called back and was notified, will be here for follow up next week and will see how things progress.

## 2011-12-13 NOTE — Telephone Encounter (Signed)
A letter with x-ray results came in the mail.  [name] has read the report, but not told her husband.  She is concerned because the report said there was a mass in the lung.  She is concerned about how her husband will react if it is cancer.  Is there going to be more testing to see what this is?  Is Dr. Jenny Reichmann going to tell him about this at his appt on Sept 11.

## 2011-12-17 ENCOUNTER — Telehealth: Payer: Self-pay

## 2011-12-17 DIAGNOSIS — H519 Unspecified disorder of binocular movement: Secondary | ICD-10-CM | POA: Diagnosis not present

## 2011-12-17 DIAGNOSIS — H5210 Myopia, unspecified eye: Secondary | ICD-10-CM | POA: Diagnosis not present

## 2011-12-17 MED ORDER — VENLAFAXINE HCL ER 150 MG PO TB24: 1.0000 | ORAL_TABLET | Freq: Every day | ORAL | Status: DC

## 2011-12-17 NOTE — Telephone Encounter (Signed)
Pharmacy requesting per pt. Request to change cymbalta to Vanlafaxine due to cost.  Please send to [facility] if ok.

## 2011-12-17 NOTE — Telephone Encounter (Signed)
Done erx 

## 2011-12-18 ENCOUNTER — Encounter: Payer: Self-pay | Admitting: Internal Medicine

## 2011-12-18 ENCOUNTER — Ambulatory Visit (INDEPENDENT_AMBULATORY_CARE_PROVIDER_SITE_OTHER): Payer: Medicare Other | Admitting: Internal Medicine

## 2011-12-18 ENCOUNTER — Other Ambulatory Visit (INDEPENDENT_AMBULATORY_CARE_PROVIDER_SITE_OTHER): Payer: Medicare Other

## 2011-12-18 ENCOUNTER — Ambulatory Visit (INDEPENDENT_AMBULATORY_CARE_PROVIDER_SITE_OTHER)
Admission: RE | Admit: 2011-12-18 | Discharge: 2011-12-18 | Disposition: A | Discharge status: Home / Self Care | Payer: Medicare Other | Source: Ambulatory Visit | Attending: Internal Medicine | Admitting: Internal Medicine

## 2011-12-18 VITALS — BP 92/58 | HR 53 | Temp 97.6°F | Ht 68.0 in | Wt 160.0 lb

## 2011-12-18 DIAGNOSIS — J189 Pneumonia, unspecified organism: Secondary | ICD-10-CM

## 2011-12-18 DIAGNOSIS — I10 Essential (primary) hypertension: Secondary | ICD-10-CM | POA: Diagnosis not present

## 2011-12-18 DIAGNOSIS — Z23 Encounter for immunization: Secondary | ICD-10-CM | POA: Diagnosis not present

## 2011-12-18 DIAGNOSIS — J449 Chronic obstructive pulmonary disease, unspecified: Secondary | ICD-10-CM | POA: Diagnosis not present

## 2011-12-18 DIAGNOSIS — E119 Type 2 diabetes mellitus without complications: Secondary | ICD-10-CM | POA: Diagnosis not present

## 2011-12-18 LAB — CBC WITH DIFFERENTIAL/PLATELET
Basophils Absolute: 0 10*3/uL (ref 0.0–0.1)
HCT: 38.6 % — ABNORMAL LOW (ref 39.0–52.0)
Lymphs Abs: 2.3 10*3/uL (ref 0.7–4.0)
Monocytes Relative: 10.7 % (ref 3.0–12.0)
Neutrophils Relative %: 45.3 % (ref 43.0–77.0)
Platelets: 219 10*3/uL (ref 150.0–400.0)
RDW: 13.9 % (ref 11.5–14.6)

## 2011-12-18 MED ORDER — DULOXETINE HCL 60 MG PO CPEP: 60.0000 mg | ORAL_CAPSULE | Freq: Every day | ORAL | Status: DC

## 2011-12-18 NOTE — Assessment & Plan Note (Signed)
Ok to hold on ACE due to lower blood pressure, fatigue

## 2011-12-18 NOTE — Progress Notes (Signed)
Subjective:    Patient ID: Tyler Oh., male    DOB: January 24, 1941, 71 y.o.   MRN: [id number]  HPI  Here to f/u; overall much improved after recent Left CAP;  Still with significant fatigue, wants flu shot today;  Has some residual cough nonprod, but no fever, HA, ST, and Pt denies chest pain, increased sob or doe, wheezing, orthopnea, PND, increased LE swelling, palpitations, dizziness or syncope.  Recent CXR with Left pna, with mild low sodium, marked leukocytosis, and mild hyperglycemia.Pt denies new neurological symptoms such as new headache, or facial or extremity weakness or numbness   Pt denies polydipsia, polyuria, or low sugar symptoms such as weakness or confusion improved with po intake.  Pt states overall good compliance with meds, trying to follow lower cholesterol, diabetic diet, no recent falls Past Medical History  Diagnosis Date  . Angina at rest   . COPD (chronic obstructive pulmonary disease)   . Allergy   . Myocardial infarction   . Diabetes mellitus   . Emphysema of lung   . GERD (gastroesophageal reflux disease)   . Hypertension   . Hyperlipidemia   . Weight loss   . CAD (coronary artery disease)   . Anxiety   . PVD (peripheral vascular disease)   . Contusion     left chest wall  . Dysphagia   . Abdominal pain     generalized  . OA (osteoarthritis)   . Asthma   . Anemia     iron deficiency  . Memory loss   . Insomnia   . DDD (degenerative disc disease), lumbar   . OSA (obstructive sleep apnea)   . Hypersomnia   . Fatigue   . Abdominal  pain, other specified site   . Urinary hesitancy   . Low back pain   . Depression   . Benign prostatic hypertrophy   . Lumbar back pain     chronic  . Spinal stenosis   . Herniated disc   . History of colonic polyps   . Diverticulitis, colon   . Esophageal stricture   . Hx of colonoscopy   . Chronic obstructive pulmonary disease (COPD)   . Rhinitis 11/17/2010  . Hiatal hernia   . Cameron lesion, acute   .  HOH (hard of hearing)    Past Surgical History  Procedure Date  . Heart bypass   . Bilateral knee replacements   . Hemorrhoid surgery   . Carpal tunnel release   . Shoulder arthroscopy right  . Coronary artery bypass graft 09/1997    double bypss  . Coronary angioplasty with stent placement 2/99  . Strabismus surgery 08/29/2011    Procedure: REPAIR STRABISMUS;  Surgeon: Derry Skill, MD;  Location: [place];  Service: Ophthalmology;  Laterality: Left;    reports that he has been smoking Cigarettes.  He has a 40 pack-year smoking history. He uses smokeless tobacco. He reports that he does not drink alcohol or use illicit drugs. family history includes Allergies in an unspecified family member; Diabetes in an unspecified family member; Emphysema in his father; Heart disease in an unspecified family member; Hyperlipidemia in an unspecified family member; Hypertension in an unspecified family member; Kidney cancer in his daughter; and Stroke in his father.  There is no history of Colon cancer. Allergies  Allergen Reactions  . Celecoxib     REACTION: GI upset  . Morphine     REACTION: itching  . Nalbuphine     REACTION: contraindication  with oxycontin.  . Prednisone     REACTION: "yeast infections"  . Venlafaxine     REACTION: GI upset   Current Outpatient Prescriptions on File Prior to Visit  Medication Sig Dispense Refill  . albuterol (PROVENTIL HFA;VENTOLIN HFA) 108 (90 BASE) MCG/ACT inhaler Inhale 2 puffs into the lungs every 4 (four) hours as needed. For shortness of breath      . ALPRAZolam (XANAX) 0.25 MG tablet Take 0.25 mg by mouth at bedtime as needed. For anxiety      . Aspirin (ADULT ASPIRIN LOW STRENGTH) 81 MG EC tablet Take 81 mg by mouth daily.       . clonazePAM (KLONOPIN) 0.5 MG tablet take 1 tablet by mouth three times a day if needed for anxiety  90 tablet  3  . docusate sodium (COLACE) 100 MG capsule Take 200 mg by mouth daily.       Marland Kitchen donepezil (ARICEPT) 5 MG tablet  take 1 take tablet by mouth at bedtime  30 tablet  6  . ferrous sulfate 325 (65 FE) MG EC tablet Take 325 mg by mouth daily.       Marland Kitchen glipiZIDE (GLUCOTROL XL) 2.5 MG 24 hr tablet Take 2.5 mg by mouth daily as needed. 1 tab by mouth per day only for blood sugar more than 180      . glipiZIDE (GLUCOTROL XL) 5 MG 24 hr tablet Take 5 mg by mouth daily as needed. 1 tab by mouth daily prn blood sugar > 180      . HYDROcodone-acetaminophen (LORCET) 10-650 MG per tablet Take 1 tablet by mouth every 6 (six) hours as needed. For pain      . LORazepam (ATIVAN) 1 MG tablet Take 0.5-1 mg by mouth 2 (two) times daily as needed. For anxiety      . metFORMIN (GLUCOPHAGE) 500 MG tablet Take 1,000 mg by mouth 2 (two) times daily with a meal. 2 tabs by mouth twice per day      . Multiple Vitamin (MULTIVITAMIN) capsule Take 1 capsule by mouth daily.       . nitroGLYCERIN (NITROSTAT) 0.4 MG SL tablet Place 0.4 mg under the tongue every 5 (five) minutes as needed. For chest pain      . omeprazole (PRILOSEC) 20 MG capsule Take 20 mg by mouth daily.       Marland Kitchen oxyCODONE (OXYCONTIN) 80 MG 12 hr tablet Take 320 mg by mouth every 12 (twelve) hours. Taking 4 tabs twice daily      . pravastatin (PRAVACHOL) 80 MG tablet Take 80 mg by mouth daily.      Marland Kitchen tiZANidine (ZANAFLEX) 4 MG tablet Take 4-8 mg by mouth every 6 (six) hours as needed. Muscle spasms      . zolpidem (AMBIEN) 10 MG tablet Take 10 mg by mouth at bedtime as needed. For sleep      . fluticasone (FLONASE) 50 MCG/ACT nasal spray Place 2 sprays into the nose daily.      . predniSONE (DELTASONE) 10 MG tablet 3 tabs by mouth per day for 3 days,2tabs per day for 3 days,1tab per day for 3 days  18 tablet  0   Current Facility-Administered Medications on File Prior to Visit  Medication Dose Route Frequency Provider Last Rate Last Dose  . dextrose 5 % solution   Intravenous Continuous Irene Shipper, MD       Review of Systems Constitutional: Negative for diaphoresis and  unexpected weight change.  HENT:  Negative for drooling and tinnitus.   Respiratory: Negative for choking and stridor.   Gastrointestinal: Negative for vomiting and blood in stool.  Genitourinary: Negative for hematuria and decreased urine volume.  Musculoskeletal: Negative for acute joint swelling Skin: Negative for color change and wound.  Neurological: Negative for tremors and numbness.  Psychiatric/Behavioral: Negative for decreased concentration. The patient is not hyperactive.      Objective:   Physical Exam BP 92/58  Pulse 53  Temp 97.6 F (36.4 C) (Oral)  Ht 5\' 8"  (1.727 m)  Wt 160 lb (72.576 kg)  BMI 24.33 kg/m2  SpO2 96% Physical Exam  VS noted, much less ill appearing, sits well in chair, walks with cane HENT: Head: Normocephalic.  Right Ear: External ear normal.  Left Ear: External ear normal.  Bilat tm's mild erythema.  Sinus nontender.  Pharynx mild erythema Eyes: Conjunctivae and EOM are normal. Pupils are equal, round, and reactive to light.  Neck: Normal range of motion. Neck supple.  Cardiovascular: Normal rate and regular rhythm.   Pulmonary/Chest: Effort normal and breath sounds decreased bilat, no rales or wheezing  Neurological: Pt is alert. Skin: Skin is warm. No erythema.  Psychiatric: Pt behavior is normal    Assessment & Plan:

## 2011-12-18 NOTE — Assessment & Plan Note (Signed)
stable overall by hx and exam, most recent data reviewed with pt, and pt to continue medical treatment as before Lab Results  Component Value Date   HGBA1C 7.0* 01/17/2011

## 2011-12-18 NOTE — Patient Instructions (Addendum)
You had the flu shot today Please continue the cymbalta OK to stop the lisinopril due to the low blood pressure Continue all other medications as before You will be contacted regarding the referral for: Dr Clance/pulmonary Please go to XRAY in the Basement for the x-ray test Please go to LAB in the Basement for the blood and/or urine tests to be done today You will be contacted by phone if any changes need to be made immediately.  Otherwise, you will receive a letter about your results with an explanation. Please return in 6 mo with Lab testing done 3-5 days before

## 2011-12-18 NOTE — Assessment & Plan Note (Signed)
stable overall by hx and exam,  and pt to continue medical treatment as before, for pulm referral in f/u, most recent data reviewed with pt, SpO2 Readings from Last 3 Encounters:  12/18/11 96%  11/26/11 90%  08/29/11 91%

## 2011-12-19 ENCOUNTER — Encounter: Payer: Self-pay | Admitting: Internal Medicine

## 2011-12-19 LAB — BASIC METABOLIC PANEL
CO2: 30 mEq/L (ref 19–32)
Calcium: 9.1 mg/dL (ref 8.4–10.5)
GFR: 69.12 mL/min (ref >60.00)
Sodium: 139 mEq/L (ref 135–145)

## 2011-12-23 DIAGNOSIS — IMO0002 Reserved for concepts with insufficient information to code with codable children: Secondary | ICD-10-CM | POA: Diagnosis not present

## 2011-12-23 DIAGNOSIS — M545 Low back pain, unspecified: Secondary | ICD-10-CM | POA: Diagnosis not present

## 2011-12-23 DIAGNOSIS — G541 Lumbosacral plexus disorders: Secondary | ICD-10-CM | POA: Diagnosis not present

## 2011-12-23 DIAGNOSIS — M5430 Sciatica, unspecified side: Secondary | ICD-10-CM | POA: Diagnosis not present

## 2011-12-28 ENCOUNTER — Other Ambulatory Visit: Payer: Self-pay | Admitting: Internal Medicine

## 2011-12-30 NOTE — Telephone Encounter (Signed)
Ok for xanax asd  Lorazepam d/c'd

## 2011-12-30 NOTE — Telephone Encounter (Signed)
Faxed hardcopy to pharmacy. 

## 2012-01-08 ENCOUNTER — Institutional Professional Consult (permissible substitution): Payer: Medicare Other | Admitting: Internal Medicine

## 2012-01-10 ENCOUNTER — Other Ambulatory Visit: Payer: Self-pay | Admitting: Internal Medicine

## 2012-01-17 ENCOUNTER — Ambulatory Visit (INDEPENDENT_AMBULATORY_CARE_PROVIDER_SITE_OTHER): Payer: Medicare Other | Admitting: Pulmonary Disease

## 2012-01-17 ENCOUNTER — Institutional Professional Consult (permissible substitution): Payer: Medicare Other | Admitting: Pulmonary Disease

## 2012-01-17 ENCOUNTER — Encounter: Payer: Self-pay | Admitting: Pulmonary Disease

## 2012-01-17 ENCOUNTER — Ambulatory Visit (INDEPENDENT_AMBULATORY_CARE_PROVIDER_SITE_OTHER)
Admission: RE | Admit: 2012-01-17 | Discharge: 2012-01-17 | Disposition: A | Discharge status: Home / Self Care | Payer: Medicare Other | Source: Ambulatory Visit | Attending: Pulmonary Disease | Admitting: Pulmonary Disease

## 2012-01-17 VITALS — BP 132/78 | HR 62 | Temp 97.9°F | Ht 68.0 in | Wt 158.6 lb

## 2012-01-17 DIAGNOSIS — J189 Pneumonia, unspecified organism: Secondary | ICD-10-CM

## 2012-01-17 DIAGNOSIS — J438 Other emphysema: Secondary | ICD-10-CM | POA: Diagnosis not present

## 2012-01-17 NOTE — Addendum Note (Signed)
Addended by: Lilli Few on: 01/17/2012 04:25 PM   Modules accepted: Orders

## 2012-01-17 NOTE — Assessment & Plan Note (Signed)
The patient has a recent lingular pneumonia which dates back to the end of August, and his followup x-ray last month showed at least an 80% improvement radiographically.  The patient also had significant clinical improvement.  He currently is bringing up primarily nonpurulent mucus, and feels that his chest congestion is much improved.  He has not had any hemoptysis.  He has lost significant weight, but his appetite is now improving.  Unfortunately he continues to smoke, and this puts him at risk for recurrent infections, and also slows resolution of prior infections.  I have explained to him that it often takes 8-10 weeks for pneumonia to clear in someone with underlying lung disease.  I would like to do one more chest x-ray to make sure that his infiltrates have totally cleared.  If not, he will need a CT scan of his chest.  I have stressed to him the importance of total smoking cessation, however the patient states that he will "die smoking".

## 2012-01-17 NOTE — Progress Notes (Signed)
  Subjective:    Patient ID: Tyler Oh., male    DOB: June 13, 1940, 71 y.o.   MRN: [id number]  HPI The patient is a 71 year old male who I have been asked to see for recent lingular pneumonia.  The patient has a long history of tobacco use, and probably underlying COPD.  He was found to have a lingular infiltrate in August of this year, and was treated with antibiotics.  He had a followup film the middle of last month, and showed at least an 80% improvement.  He also had significant clinical improvement.  Unfortunately, the patient continues to smoke, and he occasionally brings up discolored mucus.  However, his chest congestion is much improved, and he denies any hemoptysis.  He has lost significant weight over the last 6 months, but his appetite the last 2-3 weeks is much improved.  He has not had a followup chest x-ray since his film in September.   Review of Systems  Constitutional: Negative for fever and unexpected weight change.  HENT: Positive for congestion, rhinorrhea, trouble swallowing, postnasal drip and sinus pressure. Negative for ear pain, nosebleeds, sneezing and dental problem.   Eyes: Negative for redness and itching.  Respiratory: Negative for cough, chest tightness, shortness of breath and wheezing.   Cardiovascular: Positive for chest pain. Negative for palpitations and leg swelling.  Gastrointestinal: Negative for nausea and vomiting.  Genitourinary: Negative for dysuria.  Musculoskeletal: Positive for joint swelling and arthralgias.  Skin: Negative for rash.  Neurological: Positive for headaches.  Hematological: Does not bruise/bleed easily.  Psychiatric/Behavioral: Positive for confusion. Negative for dysphoric mood. The patient is nervous/anxious.        Objective:   Physical Exam Constitutional:  Thin male, no acute distress  HENT:  Nares patent without discharge  Oropharynx without exudate, palate and uvula are normal, tobacco stained tongue  Eyes:   Perrla, eomi, no scleral icterus  Neck:  No JVD, no TMG  Cardiovascular:  Normal rate, regular rhythm, no rubs or gallops.  No murmurs        Intact distal pulses  Pulmonary :  decreased breath sounds, no stridor or respiratory distress   No rales,or wheezing.  +rhonchi noted.  Abdominal:  Soft, nondistended, bowel sounds present.  No tenderness noted.   Musculoskeletal:  No lower extremity edema noted.  Lymph Nodes:  No cervical lymphadenopathy noted  Skin:  No cyanosis noted  Neurologic:  Alert, appropriate, moves all 4 extremities without obvious deficit.         Assessment & Plan:  \

## 2012-01-17 NOTE — Patient Instructions (Addendum)
Will check cxr today to followup your prior pneumonia.  Will call you with results. Try to work on smoking cessation.

## 2012-01-20 NOTE — Progress Notes (Signed)
Quick Note:  Called and spoke with patients wife, Leda Gauze. Informed her of results/recs as listed below per Dr.Clance. Leda Gauze verbalized understanding and nothing further needed at this time. ______

## 2012-01-21 DIAGNOSIS — H5030 Unspecified intermittent heterotropia: Secondary | ICD-10-CM | POA: Diagnosis not present

## 2012-01-27 ENCOUNTER — Emergency Department (INDEPENDENT_AMBULATORY_CARE_PROVIDER_SITE_OTHER)
Admission: EM | Admit: 2012-01-27 | Discharge: 2012-01-27 | Disposition: A | Discharge status: Home / Self Care | Payer: Medicare Other | Source: Home / Self Care

## 2012-01-27 ENCOUNTER — Encounter: Payer: Self-pay | Admitting: *Deleted

## 2012-01-27 DIAGNOSIS — J449 Chronic obstructive pulmonary disease, unspecified: Secondary | ICD-10-CM | POA: Diagnosis not present

## 2012-01-27 DIAGNOSIS — J069 Acute upper respiratory infection, unspecified: Secondary | ICD-10-CM | POA: Diagnosis not present

## 2012-01-27 DIAGNOSIS — Z716 Tobacco abuse counseling: Secondary | ICD-10-CM

## 2012-01-27 DIAGNOSIS — F17200 Nicotine dependence, unspecified, uncomplicated: Secondary | ICD-10-CM

## 2012-01-27 MED ORDER — AZITHROMYCIN 250 MG PO TABS: ORAL_TABLET | ORAL | Status: DC

## 2012-01-27 MED ORDER — PREDNISONE 50 MG PO TABS: ORAL_TABLET | ORAL | Status: DC

## 2012-01-27 MED ORDER — METHYLPREDNISOLONE ACETATE 80 MG/ML IJ SUSP
80.0000 mg | Freq: Once | INTRAMUSCULAR | Status: AC
Start: 2012-01-27 — End: 2012-01-27
  Administered 2012-01-27: 80 mg via INTRAMUSCULAR

## 2012-01-27 NOTE — ED Notes (Signed)
Pt c/o decreased hearing bilaterally and resolving URI x 1wk. He has taken Alka Seltzer and BC powder with some relief. Per pt he had a normal chest xray at his pulmonologist on 01-17-12 post pneumonia.

## 2012-02-14 ENCOUNTER — Other Ambulatory Visit: Payer: Self-pay | Admitting: Internal Medicine

## 2012-02-20 ENCOUNTER — Ambulatory Visit (INDEPENDENT_AMBULATORY_CARE_PROVIDER_SITE_OTHER): Payer: Medicare Other | Admitting: Internal Medicine

## 2012-02-20 ENCOUNTER — Encounter: Payer: Self-pay | Admitting: Internal Medicine

## 2012-02-20 VITALS — BP 120/62 | HR 52 | Temp 99.0°F | Ht 68.0 in | Wt 165.0 lb

## 2012-02-20 DIAGNOSIS — J209 Acute bronchitis, unspecified: Secondary | ICD-10-CM | POA: Diagnosis not present

## 2012-02-20 DIAGNOSIS — R519 Headache, unspecified: Secondary | ICD-10-CM | POA: Insufficient documentation

## 2012-02-20 DIAGNOSIS — R51 Headache: Secondary | ICD-10-CM

## 2012-02-20 DIAGNOSIS — J441 Chronic obstructive pulmonary disease with (acute) exacerbation: Secondary | ICD-10-CM | POA: Diagnosis not present

## 2012-02-20 DIAGNOSIS — E119 Type 2 diabetes mellitus without complications: Secondary | ICD-10-CM | POA: Diagnosis not present

## 2012-02-20 MED ORDER — HYDROCODONE-HOMATROPINE 5-1.5 MG/5ML PO SYRP
5.0000 mL | ORAL_SOLUTION | Freq: Four times a day (QID) | ORAL | Status: DC | PRN
Start: 2012-02-20 — End: 2012-07-01

## 2012-02-20 MED ORDER — PREDNISONE 10 MG PO TABS: ORAL_TABLET | ORAL | Status: DC

## 2012-02-20 MED ORDER — METHYLPREDNISOLONE ACETATE 80 MG/ML IJ SUSP
120.0000 mg | Freq: Once | INTRAMUSCULAR | Status: AC
  Administered 2012-02-20: 120 mg via INTRAMUSCULAR

## 2012-02-20 MED ORDER — LEVOFLOXACIN 250 MG PO TABS: 250.0000 mg | ORAL_TABLET | Freq: Every day | ORAL | Status: DC

## 2012-02-20 NOTE — Assessment & Plan Note (Signed)
Mild to mod, for antibx course,  to f/u any worsening symptoms or concerns 

## 2012-02-20 NOTE — Assessment & Plan Note (Signed)
Exam benign, explained to pt - unfortunately the only way to tx overuse HA is to stop the bc powders and let the HA's resolve

## 2012-02-20 NOTE — Assessment & Plan Note (Signed)
stable overall by hx and exam, most recent data reviewed with pt, and pt to continue medical treatment as before Lab Results  Component Value Date   HGBA1C 6.9* 12/18/2011   To call for onset polys or cbg > 200 on steroid tx

## 2012-02-20 NOTE — Patient Instructions (Addendum)
You had the steroid shot today Please use the advair sample at one puff twice per day until gone Take all new medications as prescribed - the antibiotic, cough med, and prednisone Continue all other medications as before Please have the pharmacy call with any other refills you may need. Please stop the [facility] powders completely, and the Headaches should eventually improve You can also use Mucinex and allegra for the left ear congestion Thank you for enrolling in MyChart. Please follow the instructions below to securely access your online medical record. MyChart allows you to send messages to your doctor, view your test results, renew your prescriptions, schedule appointments, and more. Your Username is:  [email or web address]

## 2012-02-20 NOTE — Assessment & Plan Note (Signed)
Mild to mod, for depomedrol, and predpack asd,  to f/u any worsening symptoms or concerns

## 2012-02-20 NOTE — Progress Notes (Signed)
Subjective:    Patient ID: Tyler Oh., male    DOB: Mar 04, 1941, 71 y.o.   MRN: [id number]  HPI  Here with acute onset mild to mod 2-3 days ST, HA, general weakness and malaise, with prod cough greenish sputum, but Pt denies chest pain, increased sob or doe, wheezing, orthopnea, PND, increased LE swelling, palpitations, dizziness or syncope, except for onset wheeze x 2 days with mild sob/doe.  Also mentions overuse HA with BC powders, and HA keeps coming back when he stops the powders/   Pt denies polydipsia, polyuria  Pt denies new neurological symptoms such as new headache, or facial or extremity weakness or numbness. Denies worsening depressive symptoms, suicidal ideation, or panic, though has ongoing anxiety.  Dementia overall stable  and not assoc with behavioral changes such as hallucinations, paranoia, or agitation.  Past Medical History  Diagnosis Date  . Angina at rest   . COPD (chronic obstructive pulmonary disease)   . Allergy   . Myocardial infarction   . Diabetes mellitus   . Emphysema of lung   . GERD (gastroesophageal reflux disease)   . Hypertension   . Hyperlipidemia   . Weight loss   . CAD (coronary artery disease)   . Anxiety   . PVD (peripheral vascular disease)   . Contusion     left chest wall  . Dysphagia   . Abdominal pain     generalized  . OA (osteoarthritis)   . Asthma   . Anemia     iron deficiency  . Memory loss   . Insomnia   . DDD (degenerative disc disease), lumbar   . OSA (obstructive sleep apnea)   . Hypersomnia   . Fatigue   . Abdominal  pain, other specified site   . Urinary hesitancy   . Low back pain   . Depression   . Benign prostatic hypertrophy   . Lumbar back pain     chronic  . Spinal stenosis   . Herniated disc   . History of colonic polyps   . Diverticulitis, colon   . Esophageal stricture   . Hx of colonoscopy   . Chronic obstructive pulmonary disease (COPD)   . Rhinitis 11/17/2010  . Hiatal hernia   . Cameron  lesion, acute   . HOH (hard of hearing)    Past Surgical History  Procedure Date  . Heart bypass   . Bilateral knee replacements   . Hemorrhoid surgery   . Carpal tunnel release   . Shoulder arthroscopy right  . Coronary artery bypass graft 09/1997    double bypss  . Coronary angioplasty with stent placement 2/99  . Strabismus surgery 08/29/2011    Procedure: REPAIR STRABISMUS;  Surgeon: Derry Skill, MD;  Location: [facility];  Service: Ophthalmology;  Laterality: Left;    reports that he has been smoking Cigarettes.  He has a 40 pack-year smoking history. He uses smokeless tobacco. He reports that he does not drink alcohol or use illicit drugs. family history includes Allergies in an unspecified family member; Diabetes in an unspecified family member; Emphysema in his father; Heart disease in an unspecified family member; Hyperlipidemia in an unspecified family member; Hypertension in an unspecified family member; Kidney cancer in his daughter; and Stroke in his father.  There is no history of Colon cancer. Allergies  Allergen Reactions  . Celecoxib     REACTION: GI upset  . Morphine     REACTION: itching  . Nalbuphine  REACTION: contraindication with oxycontin.  . Prednisone     REACTION: "yeast infections"  . Venlafaxine     REACTION: GI upset   Current Outpatient Prescriptions on File Prior to Visit  Medication Sig Dispense Refill  . albuterol (PROVENTIL HFA;VENTOLIN HFA) 108 (90 BASE) MCG/ACT inhaler Inhale 2 puffs into the lungs every 4 (four) hours as needed. For shortness of breath      . ALPRAZolam (XANAX) 0.25 MG tablet Take 1 tablet (0.25 mg total) by mouth 2 (two) times daily as needed.  60 tablet  2  . Aspirin (ADULT ASPIRIN LOW STRENGTH) 81 MG EC tablet Take 81 mg by mouth daily.       . clonazePAM (KLONOPIN) 0.5 MG tablet take 1 tablet by mouth three times a day if needed for anxiety  90 tablet  3  . docusate sodium (COLACE) 100 MG capsule Take 200 mg by mouth  daily.       Marland Kitchen donepezil (ARICEPT) 5 MG tablet take 1 take tablet by mouth at bedtime  30 tablet  6  . DULoxetine (CYMBALTA) 60 MG capsule Take 1 capsule (60 mg total) by mouth daily.  180 capsule  3  . ferrous sulfate 325 (65 FE) MG EC tablet Take 325 mg by mouth daily.       . fluticasone (FLONASE) 50 MCG/ACT nasal spray Place 2 sprays into the nose daily.      Marland Kitchen glipiZIDE (GLUCOTROL XL) 2.5 MG 24 hr tablet Take 2.5 mg by mouth daily as needed. 1 tab by mouth per day only for blood sugar more than 180      . glipiZIDE (GLUCOTROL XL) 5 MG 24 hr tablet Take 5 mg by mouth daily as needed. 1 tab by mouth daily prn blood sugar > 180      . HYDROcodone-acetaminophen (LORCET) 10-650 MG per tablet Take 1 tablet by mouth every 6 (six) hours as needed. For pain      . metFORMIN (GLUCOPHAGE) 500 MG tablet Take 1,000 mg by mouth 2 (two) times daily with a meal. 2 tabs by mouth twice per day      . metFORMIN (GLUCOPHAGE) 500 MG tablet take 2 tablets by mouth twice a day  360 tablet  3  . Multiple Vitamin (MULTIVITAMIN) capsule Take 1 capsule by mouth daily.       . nitroGLYCERIN (NITROSTAT) 0.4 MG SL tablet Place 0.4 mg under the tongue every 5 (five) minutes as needed. For chest pain      . omeprazole (PRILOSEC) 20 MG capsule Take 20 mg by mouth daily.       Marland Kitchen oxyCODONE (OXYCONTIN) 80 MG 12 hr tablet Take 320 mg by mouth every 12 (twelve) hours. Taking 4 tabs twice daily      . pravastatin (PRAVACHOL) 80 MG tablet take 1 tablet by mouth once daily  90 tablet  3  . tiZANidine (ZANAFLEX) 4 MG tablet Take 4-8 mg by mouth every 6 (six) hours as needed. Muscle spasms      . zolpidem (AMBIEN) 10 MG tablet Take 10 mg by mouth at bedtime as needed. For sleep      . azithromycin (ZITHROMAX) 250 MG tablet Take 2 tabs PO x 1 dose, then 1 tab PO QD x 4 days  6 tablet  0   Current Facility-Administered Medications on File Prior to Visit  Medication Dose Route Frequency Provider Last Rate Last Dose  . dextrose 5 %  solution   Intravenous Continuous Docia Chuck  Henrene Pastor, MD       . Review of Systems  Constitutional: Negative for diaphoresis and unexpected weight change.  HENT: Negative for tinnitus.   Eyes: Negative for photophobia and visual disturbance.  Respiratory: Negative for choking and stridor.   Gastrointestinal: Negative for vomiting and blood in stool.  Genitourinary: Negative for hematuria and decreased urine volume.  Musculoskeletal: Negative for gait problem.  Skin: Negative for color change and wound.  Neurological: Negative for tremors and numbness.  Psychiatric/Behavioral: Negative for decreased concentration. The patient is not hyperactive.       Objective:   Physical Exam BP 120/62  Pulse 52  Temp 99 F (37.2 C) (Oral)  Ht 5\' 8"  (1.727 m)  Wt 165 lb (74.844 kg)  BMI 25.09 kg/m2  SpO2 93% Physical Exam  VS noted, mild ill Constitutional: Pt appears well-developed and well-nourished.  HENT: Head: Normocephalic.  Right Ear: External ear normal.  Left Ear: External ear normal.  Bilat tm's mild erythema.  Sinus nontender.  Pharynx mild erythema Eyes: Conjunctivae and EOM are normal. Pupils are equal, round, and reactive to light.  Neck: Normal range of motion. Neck supple.  Cardiovascular: Normal rate and regular rhythm.   Pulmonary/Chest: Effort normal and breath sounds mild decresed, mild wheeze bilat, no rales.  Neurological: Pt is alert. Skin: Skin is warm. No erythema.  Psychiatric: Pt behavior is normal.    Assessment & Plan:

## 2012-03-06 ENCOUNTER — Other Ambulatory Visit: Payer: Self-pay | Admitting: Internal Medicine

## 2012-03-09 ENCOUNTER — Telehealth: Payer: Self-pay

## 2012-03-09 DIAGNOSIS — F419 Anxiety disorder, unspecified: Secondary | ICD-10-CM

## 2012-03-09 NOTE — Telephone Encounter (Signed)
The patients wife is requesting a referral for her husband to Mental health ASAP as anxiety attacks are increasing.   Please advise

## 2012-03-09 NOTE — Telephone Encounter (Signed)
Does she mean for counseling such as with a psychologist,  Or does she mean for medication adjustment, as with psychiatry?

## 2012-03-10 NOTE — Telephone Encounter (Signed)
Patient's wife informed

## 2012-03-10 NOTE — Telephone Encounter (Signed)
The patients wife stated he has just started having problems being very anxious.  She stated she does not know which he needs.  She did state if needed they could come in for PCP to evaluate or refer?  Advise please

## 2012-03-10 NOTE — Telephone Encounter (Signed)
prob referral to psychiatry more likely needed - done per emr

## 2012-03-14 DIAGNOSIS — Z9981 Dependence on supplemental oxygen: Secondary | ICD-10-CM | POA: Diagnosis not present

## 2012-03-14 DIAGNOSIS — I10 Essential (primary) hypertension: Secondary | ICD-10-CM | POA: Diagnosis not present

## 2012-03-14 DIAGNOSIS — E7800 Pure hypercholesterolemia, unspecified: Secondary | ICD-10-CM | POA: Diagnosis not present

## 2012-03-14 DIAGNOSIS — F329 Major depressive disorder, single episode, unspecified: Secondary | ICD-10-CM | POA: Diagnosis not present

## 2012-03-14 DIAGNOSIS — E119 Type 2 diabetes mellitus without complications: Secondary | ICD-10-CM | POA: Diagnosis not present

## 2012-03-14 DIAGNOSIS — Z885 Allergy status to narcotic agent status: Secondary | ICD-10-CM | POA: Diagnosis not present

## 2012-03-14 DIAGNOSIS — Z79899 Other long term (current) drug therapy: Secondary | ICD-10-CM | POA: Diagnosis not present

## 2012-03-14 DIAGNOSIS — F17200 Nicotine dependence, unspecified, uncomplicated: Secondary | ICD-10-CM | POA: Diagnosis not present

## 2012-03-14 DIAGNOSIS — F411 Generalized anxiety disorder: Secondary | ICD-10-CM | POA: Diagnosis not present

## 2012-03-14 DIAGNOSIS — R079 Chest pain, unspecified: Secondary | ICD-10-CM | POA: Diagnosis not present

## 2012-03-14 DIAGNOSIS — J449 Chronic obstructive pulmonary disease, unspecified: Secondary | ICD-10-CM | POA: Diagnosis not present

## 2012-03-14 DIAGNOSIS — R072 Precordial pain: Secondary | ICD-10-CM | POA: Diagnosis not present

## 2012-03-14 DIAGNOSIS — I4510 Unspecified right bundle-branch block: Secondary | ICD-10-CM | POA: Diagnosis not present

## 2012-03-14 DIAGNOSIS — K219 Gastro-esophageal reflux disease without esophagitis: Secondary | ICD-10-CM | POA: Diagnosis not present

## 2012-03-14 DIAGNOSIS — Z888 Allergy status to other drugs, medicaments and biological substances status: Secondary | ICD-10-CM | POA: Diagnosis not present

## 2012-03-14 DIAGNOSIS — I2510 Atherosclerotic heart disease of native coronary artery without angina pectoris: Secondary | ICD-10-CM | POA: Diagnosis not present

## 2012-03-14 DIAGNOSIS — J441 Chronic obstructive pulmonary disease with (acute) exacerbation: Secondary | ICD-10-CM | POA: Diagnosis not present

## 2012-03-14 DIAGNOSIS — F0390 Unspecified dementia without behavioral disturbance: Secondary | ICD-10-CM | POA: Diagnosis not present

## 2012-03-14 DIAGNOSIS — R0789 Other chest pain: Secondary | ICD-10-CM | POA: Diagnosis not present

## 2012-03-20 DIAGNOSIS — M542 Cervicalgia: Secondary | ICD-10-CM | POA: Diagnosis not present

## 2012-03-20 DIAGNOSIS — H8190 Unspecified disorder of vestibular function, unspecified ear: Secondary | ICD-10-CM | POA: Diagnosis not present

## 2012-03-20 DIAGNOSIS — Z79899 Other long term (current) drug therapy: Secondary | ICD-10-CM | POA: Diagnosis not present

## 2012-03-20 DIAGNOSIS — M5430 Sciatica, unspecified side: Secondary | ICD-10-CM | POA: Diagnosis not present

## 2012-03-20 DIAGNOSIS — M545 Low back pain, unspecified: Secondary | ICD-10-CM | POA: Diagnosis not present

## 2012-03-20 DIAGNOSIS — R42 Dizziness and giddiness: Secondary | ICD-10-CM | POA: Diagnosis not present

## 2012-03-20 DIAGNOSIS — H81399 Other peripheral vertigo, unspecified ear: Secondary | ICD-10-CM | POA: Diagnosis not present

## 2012-03-20 DIAGNOSIS — IMO0002 Reserved for concepts with insufficient information to code with codable children: Secondary | ICD-10-CM | POA: Diagnosis not present

## 2012-03-20 DIAGNOSIS — R269 Unspecified abnormalities of gait and mobility: Secondary | ICD-10-CM | POA: Diagnosis not present

## 2012-03-20 DIAGNOSIS — G541 Lumbosacral plexus disorders: Secondary | ICD-10-CM | POA: Diagnosis not present

## 2012-03-24 DIAGNOSIS — F4001 Agoraphobia with panic disorder: Secondary | ICD-10-CM | POA: Diagnosis not present

## 2012-03-26 ENCOUNTER — Other Ambulatory Visit: Payer: Self-pay | Admitting: Internal Medicine

## 2012-03-27 ENCOUNTER — Other Ambulatory Visit: Payer: Self-pay | Admitting: Cardiology

## 2012-03-27 ENCOUNTER — Other Ambulatory Visit: Payer: Self-pay | Admitting: Internal Medicine

## 2012-03-27 MED ORDER — NITROGLYCERIN 0.4 MG SL SUBL: 0.4000 mg | SUBLINGUAL_TABLET | SUBLINGUAL | Status: DC | PRN

## 2012-03-27 NOTE — Telephone Encounter (Signed)
Faxed hardcopy to pharmacy. 

## 2012-03-27 NOTE — Telephone Encounter (Signed)
Done hardcopy to robin  

## 2012-04-06 DIAGNOSIS — H5030 Unspecified intermittent heterotropia: Secondary | ICD-10-CM | POA: Diagnosis not present

## 2012-04-09 DIAGNOSIS — F4001 Agoraphobia with panic disorder: Secondary | ICD-10-CM | POA: Diagnosis not present

## 2012-04-22 DIAGNOSIS — F4001 Agoraphobia with panic disorder: Secondary | ICD-10-CM | POA: Diagnosis not present

## 2012-05-06 NOTE — ED Provider Notes (Signed)
History     CSN: [name]:[id number]  Arrival date & time 01/27/12  1613   First MD Initiated Contact with Patient 01/27/12 1614      Chief Complaint  Patient presents with  . Hearing Problem  . Cough   HPI  URI Symptoms Onset: 1 week  Description: rhinorrhea, nasal congestion, cough, wheezing  Modifying factors:  Multiple medical problems, no CP, nausea, fever  Symptoms Nasal discharge: yes Fever: no Sore throat: no Cough: yes Wheezing: yes Ear pain: mild GI symptoms: no Sick contacts: no  Red Flags  Stiff neck: no Dyspnea: no Rash: no Swallowing difficulty: no  Sinusitis Risk Factors Headache/face pain: no Double sickening: no tooth pain: no  Allergy Risk Factors Sneezing: mild Itchy scratchy throat: no Seasonal symptoms: no  Flu Risk Factors Headache: no muscle aches: no severe fatigue: no   Past Medical History  Diagnosis Date  . Angina at rest   . COPD (chronic obstructive pulmonary disease)   . Allergy   . Myocardial infarction   . Diabetes mellitus   . Emphysema of lung   . GERD (gastroesophageal reflux disease)   . Hypertension   . Hyperlipidemia   . Weight loss   . CAD (coronary artery disease)   . Anxiety   . PVD (peripheral vascular disease)   . Contusion     left chest wall  . Dysphagia   . Abdominal pain     generalized  . OA (osteoarthritis)   . Asthma   . Anemia     iron deficiency  . Memory loss   . Insomnia   . DDD (degenerative disc disease), lumbar   . OSA (obstructive sleep apnea)   . Hypersomnia   . Fatigue   . Abdominal  pain, other specified site   . Urinary hesitancy   . Low back pain   . Depression   . Benign prostatic hypertrophy   . Lumbar back pain     chronic  . Spinal stenosis   . Herniated disc   . History of colonic polyps   . Diverticulitis, colon   . Esophageal stricture   . Hx of colonoscopy   . Chronic obstructive pulmonary disease (COPD)   . Rhinitis 11/17/2010  . Hiatal hernia   . Cameron  lesion, acute   . HOH (hard of hearing)     Past Surgical History  Procedure Date  . Heart bypass   . Bilateral knee replacements   . Hemorrhoid surgery   . Carpal tunnel release   . Shoulder arthroscopy right  . Coronary artery bypass graft 09/1997    double bypss  . Coronary angioplasty with stent placement 2/99  . Strabismus surgery 08/29/2011    Procedure: REPAIR STRABISMUS;  Surgeon: Derry Skill, MD;  Location: [facility];  Service: Ophthalmology;  Laterality: Left;    Family History  Problem Relation Age of Onset  . Stroke Father   . Emphysema Father   . Heart disease    . Hypertension    . Hyperlipidemia    . Kidney cancer Daughter   . Allergies    . Colon cancer Neg Hx   . Diabetes      History  Substance Use Topics  . Smoking status: Current Every Day Smoker -- 1.0 packs/day for 40 years    Types: Cigarettes  . Smokeless tobacco: Current User  . Alcohol Use: No      Review of Systems  All other systems reviewed and are negative.  Allergies  Celecoxib; Morphine; Nalbuphine; Prednisone; and Venlafaxine  Home Medications   Current Outpatient Rx  Name  Route  Sig  Dispense  Refill  . ALBUTEROL SULFATE HFA 108 (90 BASE) MCG/ACT IN AERS   Inhalation   Inhale 2 puffs into the lungs every 4 (four) hours as needed. For shortness of breath         . ALPRAZOLAM 0.25 MG PO TABS      take 1 tablet by mouth twice a day if needed   60 tablet   2   . ASPIRIN 81 MG PO TBDP   Oral   Take 81 mg by mouth daily.          . AZITHROMYCIN 250 MG PO TABS      Take 2 tabs PO x 1 dose, then 1 tab PO QD x 4 days   6 tablet   0   . CLONAZEPAM 0.5 MG PO TABS      take 1 tablet by mouth three times a day if needed for anxiety   90 tablet   3   . DOCUSATE SODIUM 100 MG PO CAPS   Oral   Take 200 mg by mouth daily.          . DONEPEZIL HCL 5 MG PO TABS      take 1 take tablet by mouth at bedtime   30 tablet   6   . DULOXETINE HCL 60 MG PO CPEP    Oral   Take 1 capsule (60 mg total) by mouth daily.   180 capsule   3   . FERROUS SULFATE 325 (65 FE) MG PO TBEC   Oral   Take 325 mg by mouth daily.          Marland Kitchen FLUTICASONE PROPIONATE 50 MCG/ACT NA SUSP   Nasal   Place 2 sprays into the nose daily.         Marland Kitchen GLIPIZIDE ER 2.5 MG PO TB24   Oral   Take 2.5 mg by mouth daily as needed. 1 tab by mouth per day only for blood sugar more than 180         . GLIPIZIDE ER 5 MG PO TB24   Oral   Take 5 mg by mouth daily as needed. 1 tab by mouth daily prn blood sugar > 180         . GLIPIZIDE XL 2.5 MG PO TB24      take 1 tablet by mouth once daily ONLY FOR BLOOD SUGAR MORE THAN 180   30 each   11   . HYDROCODONE-ACETAMINOPHEN 10-650 MG PO TABS   Oral   Take 1 tablet by mouth every 6 (six) hours as needed. For pain         . HYDROCODONE-HOMATROPINE 5-1.5 MG/5ML PO SYRP   Oral   Take 5 mLs by mouth every 6 (six) hours as needed for cough.   120 mL   1   . LEVOFLOXACIN 250 MG PO TABS   Oral   Take 1 tablet (250 mg total) by mouth daily.   10 tablet   0   . METFORMIN HCL 500 MG PO TABS   Oral   Take 1,000 mg by mouth 2 (two) times daily with a meal. 2 tabs by mouth twice per day         . METFORMIN HCL 500 MG PO TABS      take 2 tablets by mouth twice a day  360 tablet   3   . MULTIVITAMINS PO CAPS   Oral   Take 1 capsule by mouth daily.          Marland Kitchen NITROGLYCERIN 0.4 MG SL SUBL   Sublingual   Place 1 tablet (0.4 mg total) under the tongue every 5 (five) minutes as needed. For chest pain   25 tablet   5   . OMEPRAZOLE 20 MG PO CPDR   Oral   Take 20 mg by mouth daily.          . OXYCODONE HCL ER 80 MG PO TB12   Oral   Take 320 mg by mouth every 12 (twelve) hours. Taking 4 tabs twice daily         . PRAVASTATIN SODIUM 80 MG PO TABS      take 1 tablet by mouth once daily   90 tablet   3   . PREDNISONE 10 MG PO TABS      3 tabs by mouth per day for 3 days,2tabs per day for 3 days,1tab per  day for 3 days   18 tablet   0   . TIZANIDINE HCL 4 MG PO TABS      take 1 to 2 tablets by mouth every 6 hours if needed   120 tablet   2     Refill Approved   . ZOLPIDEM TARTRATE 10 MG PO TABS   Oral   Take 10 mg by mouth at bedtime as needed. For sleep           BP 142/83  Pulse 63  Temp 97.9 F (36.6 C) (Oral)  Resp 18  Ht 5\' 8"  (1.727 m)  Wt 164 lb (74.39 kg)  BMI 24.94 kg/m2  SpO2 96%  Physical Exam  Constitutional: He appears well-developed and well-nourished.  HENT:  Head: Normocephalic and atraumatic.  Right Ear: External ear normal.  Left Ear: External ear normal.  +nasal erythema, rhinorrhea bilaterally, + post oropharyngeal erythema    Eyes: Conjunctivae are normal. Pupils are equal, round, and reactive to light.  Neck: Normal range of motion.  Cardiovascular: Normal rate and regular rhythm.   Pulmonary/Chest:  + inspiratory wheezes diffusely   Abdominal: Soft.  Musculoskeletal: Normal range of motion.  Neurological: He is alert.  Skin: Skin is warm.    ED Course  Procedures (including critical care time)  Labs Reviewed - No data to display No results found.   1. URI (upper respiratory infection)   2. COPD (chronic obstructive pulmonary disease)   3. Tobacco abuse counseling       MDM  Viral induced COPD exacerbation  Solumedrol 125mg  IM x1 Prednisone x 5 days  zpak for atypical coverage.  Discussed smoking cessation at length.  Discussed infectious and resp red flags at length as well.  Follow up as needed.      The patient and/or caregiver has been counseled thoroughly with regard to treatment plan and/or medications prescribed including dosage, schedule, interactions, rationale for use, and possible side effects and they verbalize understanding. Diagnoses and expected course of recovery discussed and will return if not improved as expected or if the condition worsens. Patient and/or caregiver verbalized understanding.              Shanda Howells, MD 08/19/12 [phone]

## 2012-05-11 DIAGNOSIS — F4001 Agoraphobia with panic disorder: Secondary | ICD-10-CM | POA: Diagnosis not present

## 2012-05-25 DIAGNOSIS — F4001 Agoraphobia with panic disorder: Secondary | ICD-10-CM | POA: Diagnosis not present

## 2012-06-04 ENCOUNTER — Encounter: Payer: Self-pay | Admitting: *Deleted

## 2012-06-05 DIAGNOSIS — M545 Low back pain, unspecified: Secondary | ICD-10-CM | POA: Diagnosis not present

## 2012-06-05 DIAGNOSIS — H81399 Other peripheral vertigo, unspecified ear: Secondary | ICD-10-CM | POA: Diagnosis not present

## 2012-06-05 DIAGNOSIS — Z79899 Other long term (current) drug therapy: Secondary | ICD-10-CM | POA: Diagnosis not present

## 2012-06-05 DIAGNOSIS — G541 Lumbosacral plexus disorders: Secondary | ICD-10-CM | POA: Diagnosis not present

## 2012-06-05 DIAGNOSIS — H8190 Unspecified disorder of vestibular function, unspecified ear: Secondary | ICD-10-CM | POA: Diagnosis not present

## 2012-06-07 ENCOUNTER — Other Ambulatory Visit: Payer: Self-pay | Admitting: Internal Medicine

## 2012-06-12 ENCOUNTER — Ambulatory Visit: Payer: Medicare Other | Admitting: Cardiology

## 2012-06-16 ENCOUNTER — Other Ambulatory Visit: Payer: Self-pay | Admitting: Internal Medicine

## 2012-06-16 NOTE — Telephone Encounter (Signed)
Done hardcopy to robin  

## 2012-06-16 NOTE — Telephone Encounter (Signed)
Faxed hardcopy to pharmacy. 

## 2012-06-22 ENCOUNTER — Ambulatory Visit: Payer: Medicare Other | Admitting: Physician Assistant

## 2012-07-01 ENCOUNTER — Ambulatory Visit (INDEPENDENT_AMBULATORY_CARE_PROVIDER_SITE_OTHER): Payer: Medicare Other | Admitting: Physician Assistant

## 2012-07-01 ENCOUNTER — Encounter: Payer: Self-pay | Admitting: Physician Assistant

## 2012-07-01 VITALS — BP 120/78 | HR 54 | Ht 68.0 in | Wt 168.4 lb

## 2012-07-01 DIAGNOSIS — R079 Chest pain, unspecified: Secondary | ICD-10-CM

## 2012-07-01 DIAGNOSIS — F17200 Nicotine dependence, unspecified, uncomplicated: Secondary | ICD-10-CM

## 2012-07-01 DIAGNOSIS — E785 Hyperlipidemia, unspecified: Secondary | ICD-10-CM

## 2012-07-01 DIAGNOSIS — I25810 Atherosclerosis of coronary artery bypass graft(s) without angina pectoris: Secondary | ICD-10-CM | POA: Diagnosis not present

## 2012-07-01 DIAGNOSIS — J449 Chronic obstructive pulmonary disease, unspecified: Secondary | ICD-10-CM

## 2012-07-01 MED ORDER — AMLODIPINE BESYLATE 2.5 MG PO TABS: 2.5000 mg | ORAL_TABLET | Freq: Every day | ORAL | Status: DC

## 2012-07-01 NOTE — Patient Instructions (Addendum)
Your physician has requested that you have a dobutamine myoview. For furth information please visit [email or web address]. Please follow instruction sheet, as given.  Your physician recommends that you schedule a follow-up appointment in: 1 month

## 2012-07-01 NOTE — Progress Notes (Signed)
[facility]. [address], [place], [place]  [address] Phone: [phone] Fax:  [phone]  Date:  07/01/2012   ID:  Tyler WOODROME Sr., DOB [birth year], MRN [id number]  PCP:  Cathlean Cower, MD  Primary Cardiologist:  Dr. Jenell Milliner     History of Present Illness: Tyler POUSSON Sr. is a 72 y.o. male who returns for f/u.  He has a hx of CAD, s/p remote RCA stenting in 1998 and HSRA for ISR in 1999, s/p subsequent CABG in 1999, DM2, HTN, HL, tobacco abuse, DJD, PAD with known left distal SFA occlusion, chronic back pain, obesity, COPD, asbestosis. LHC 11/2003: CFX 95%, RCA occluded, SVG-RCA occluded, SVG-OM patent, native LAD patent. EF was 55%.  Patient had left to right collaterals and medical therapy was recommended. He was previously followed by Dr. Rollene Fare and established with Dr. [name] in 2008.  He was last seen 11/2010.  Patient notes episode of CP several times last week.  He is sedentary given his COPD.  He does not leave his house much and the most activity he does is walking up and down his hallway.  CP was sharp and relieved by NTG x 2.  He notes assoc nausea, diaphoresis and dyspnea.  No radiating symptoms.  No syncope.  He sleeps at an incline chronically.  No PND.  He has stable LE edema.    Labs (10/12):  LDL 85, TSH 1.76 Labs (8/13):    K 4.2, Cr 1.3, ALT 16 Labs (9/13):    K 5.5, Cr 1.1, Hgb 12.8, A1c 6.9  Wt Readings from Last 3 Encounters:  07/01/12 168 lb 6.4 oz (76.386 kg)  02/20/12 165 lb (74.844 kg)  01/27/12 164 lb (74.39 kg)     Past Medical History  Diagnosis Date  . COPD (chronic obstructive pulmonary disease)   . Allergy   . Diabetes mellitus   . Emphysema of lung   . GERD (gastroesophageal reflux disease)   . Hypertension   . Hyperlipidemia   . CAD (coronary artery disease)     a. s/p multiple PCIs to RCA and eventual CABG in 1999;  b.LHC 11/2003: CFX 95%, RCA occluded, SVG-RCA occluded, SVG-OM patent, native LAD patent. EF was 55%.  Patient  had left to right collaterals and medical therapy was recommended    . Anxiety   . PVD (peripheral vascular disease)   . OA (osteoarthritis)   . Asthma   . Iron deficiency anemia   . Memory loss   . Insomnia   . DDD (degenerative disc disease), lumbar   . OSA (obstructive sleep apnea)   . Depression   . Benign prostatic hypertrophy   . Lumbar back pain     chronic  . Spinal stenosis   . Herniated disc   . History of colonic polyps   . Diverticulitis, colon   . Esophageal stricture   . Hx of colonoscopy   . Hiatal hernia   . HOH (hard of hearing)     Current Outpatient Prescriptions  Medication Sig Dispense Refill  . albuterol (PROVENTIL HFA;VENTOLIN HFA) 108 (90 BASE) MCG/ACT inhaler Inhale 2 puffs into the lungs every 4 (four) hours as needed. For shortness of breath      . ALPRAZolam (XANAX) 0.25 MG tablet take 1 tablet by mouth twice a day if needed  60 tablet  2  . Aspirin (ADULT ASPIRIN LOW STRENGTH) 81 MG EC tablet Take 81 mg by mouth daily.       Marland Kitchen  clonazePAM (KLONOPIN) 0.5 MG tablet take 1 tablet by mouth three times a day if needed for anxiety  90 tablet  3  . docusate sodium (COLACE) 100 MG capsule Take 200 mg by mouth daily.       Marland Kitchen donepezil (ARICEPT) 5 MG tablet take 1 take tablet by mouth at bedtime  30 tablet  6  . DULoxetine (CYMBALTA) 60 MG capsule Take 1 capsule (60 mg total) by mouth daily.  180 capsule  3  . ferrous sulfate 325 (65 FE) MG EC tablet Take 325 mg by mouth daily.       Marland Kitchen glipiZIDE (GLUCOTROL XL) 5 MG 24 hr tablet Take 5 mg by mouth daily as needed. 1 tab by mouth daily prn blood sugar > 180      . GLIPIZIDE XL 2.5 MG 24 hr tablet take 1 tablet by mouth once daily ONLY FOR BLOOD SUGAR MORE THAN 180  30 each  11  . HYDROcodone-acetaminophen (LORCET) 10-650 MG per tablet Take 1 tablet by mouth every 6 (six) hours as needed. For pain      . metFORMIN (GLUCOPHAGE) 500 MG tablet Take 1,000 mg by mouth 2 (two) times daily with a meal. 2 tabs by mouth  twice per day      . Multiple Vitamin (MULTIVITAMIN) capsule Take 1 capsule by mouth daily.       . nitroGLYCERIN (NITROSTAT) 0.4 MG SL tablet Place 1 tablet (0.4 mg total) under the tongue every 5 (five) minutes as needed. For chest pain  25 tablet  5  . omeprazole (PRILOSEC) 20 MG capsule Take 20 mg by mouth daily.       Marland Kitchen oxyCODONE (OXYCONTIN) 80 MG 12 hr tablet Take 320 mg by mouth every 12 (twelve) hours. Taking 4 tabs twice daily      . pravastatin (PRAVACHOL) 80 MG tablet take 1 tablet by mouth once daily  90 tablet  3  . zolpidem (AMBIEN) 10 MG tablet Take 10 mg by mouth at bedtime as needed. For sleep       Current Facility-Administered Medications  Medication Dose Route Frequency Provider Last Rate Last Dose  . dextrose 5 % solution   Intravenous Continuous Irene Shipper, MD        Allergies:    Allergies  Allergen Reactions  . Celecoxib     REACTION: GI upset  . Morphine     REACTION: itching  . Nalbuphine     REACTION: contraindication with oxycontin.  . Prednisone     REACTION: "yeast infections"  . Venlafaxine     REACTION: GI upset    Social History:  The patient  reports that he has been smoking Cigarettes.  He has a 40 pack-year smoking history. He uses smokeless tobacco. He reports that he does not drink alcohol or use illicit drugs.   ROS:  Please see the history of present illness.   He has a chronic cough.   All other systems reviewed and negative.   PHYSICAL EXAM: VS:  BP 120/78  Pulse 54  Ht 5\' 8"  (1.727 m)  Wt 168 lb 6.4 oz (76.386 kg)  BMI 25.61 kg/m2 Chronically ill appearing male in no acute distress HEENT: normal Neck: no JVD Cardiac:  distant S1, S2; RRR; no murmur Lungs:  Decreased breath sounds bilaterally, diffuse inspiratory and expiratory wheezing Abd: soft, nontender, no hepatomegaly Ext: no edema Skin: warm and dry Neuro:  CNs 2-12 intact, no focal abnormalities noted  EKG:  Sinus  brady, HR 54, NSSTTW changes     ASSESSMENT AND  PLAN:  1. Chest Pain:  Symptoms are s/w reminiscent of his prior angina.  They were relieved with NTG.  ECG is ok.  He has not had any symptoms since last week.  Given his co-morbidities, would try to avoid cath if possible.  Reviewed with Dr. Peter Martinique (D[facility]).  Will arrange Dobutamine Myoview for risk stratification.  If low risk, will try to continue with medical Rx.   2. Coronary Artery Disease:  Proceed with myoview.  Continue ASA and statin.  Add low dose amlodipine 2.5 mg QD as an antianginal.  Bradycardia and lung disease make using beta blocker impossible.  He did not tolerate nitrates in the past due to HAs.   3. Hyperlipidemia:  Managed by PCP.  4. Tobacco Abuse:  I recommended he quit. 5. COPD:  F/u with Primary Care.  As noted, will use Dobutamine instead of adenosine or Lexiscan given significant COPD and active wheezing. 6. Disposition:  F/u with Dr. Jenell Milliner or me in 1 month or sooner if myoview abnormal.   Signed, Richardson Dopp, PA-C  4:00 PM 07/01/2012

## 2012-07-08 ENCOUNTER — Encounter (HOSPITAL_COMMUNITY): Payer: Medicare Other

## 2012-07-28 ENCOUNTER — Ambulatory Visit: Payer: Medicare Other | Admitting: Physician Assistant

## 2012-07-29 DIAGNOSIS — F4001 Agoraphobia with panic disorder: Secondary | ICD-10-CM | POA: Diagnosis not present

## 2012-08-09 ENCOUNTER — Other Ambulatory Visit: Payer: Self-pay | Admitting: Internal Medicine

## 2012-08-12 ENCOUNTER — Telehealth: Payer: Self-pay

## 2012-08-12 NOTE — Telephone Encounter (Signed)
A user error has taken place: encounter opened in error, closed for administrative reasons.

## 2012-08-13 ENCOUNTER — Telehealth: Payer: Self-pay

## 2012-08-13 NOTE — Telephone Encounter (Signed)
Updated medication list

## 2012-08-24 DIAGNOSIS — H81399 Other peripheral vertigo, unspecified ear: Secondary | ICD-10-CM | POA: Diagnosis not present

## 2012-08-24 DIAGNOSIS — M545 Low back pain, unspecified: Secondary | ICD-10-CM | POA: Diagnosis not present

## 2012-08-24 DIAGNOSIS — H8190 Unspecified disorder of vestibular function, unspecified ear: Secondary | ICD-10-CM | POA: Diagnosis not present

## 2012-08-24 DIAGNOSIS — G541 Lumbosacral plexus disorders: Secondary | ICD-10-CM | POA: Diagnosis not present

## 2012-09-01 ENCOUNTER — Other Ambulatory Visit: Payer: Self-pay | Admitting: Internal Medicine

## 2012-09-01 NOTE — Telephone Encounter (Signed)
Faxed hardcopy to [employer]

## 2012-09-01 NOTE — Telephone Encounter (Signed)
Done hardcopy to robin  

## 2012-10-07 ENCOUNTER — Encounter: Payer: Self-pay | Admitting: Cardiology

## 2012-10-07 ENCOUNTER — Ambulatory Visit (INDEPENDENT_AMBULATORY_CARE_PROVIDER_SITE_OTHER): Payer: Medicare Other | Admitting: Cardiology

## 2012-10-07 VITALS — BP 130/64 | HR 51 | Ht 68.0 in | Wt 169.0 lb

## 2012-10-07 DIAGNOSIS — I2510 Atherosclerotic heart disease of native coronary artery without angina pectoris: Secondary | ICD-10-CM

## 2012-10-07 DIAGNOSIS — I209 Angina pectoris, unspecified: Secondary | ICD-10-CM

## 2012-10-07 DIAGNOSIS — J449 Chronic obstructive pulmonary disease, unspecified: Secondary | ICD-10-CM | POA: Diagnosis not present

## 2012-10-07 DIAGNOSIS — E785 Hyperlipidemia, unspecified: Secondary | ICD-10-CM

## 2012-10-07 DIAGNOSIS — I10 Essential (primary) hypertension: Secondary | ICD-10-CM

## 2012-10-07 DIAGNOSIS — F17200 Nicotine dependence, unspecified, uncomplicated: Secondary | ICD-10-CM

## 2012-10-07 NOTE — Patient Instructions (Addendum)
Your physician recommends that you continue on your current medications as directed. Please refer to the Current Medication list given to you today.  Your physician wants you to follow-up in: 1 year with Dr. Lauree Chandler.  You will receive a reminder letter in the mail two months in advance. If you don't receive a letter, please call our office to schedule the follow-up appointment.

## 2012-10-07 NOTE — Assessment & Plan Note (Signed)
Stable. Continue secondary preventative therapy. Encouraged not to smoke.

## 2012-10-07 NOTE — Progress Notes (Signed)
HPI [name] returns today for evaluation and management of his chronic coronary artery disease and chronic angina. He has multiple cardiac risk factors and also continues to smoke heavily. We have asked him to quit many times before.  His wife says that he's fatigued all the time. He denies any increase in his angina. He seems to be compliant with his medications.  Past Medical History  Diagnosis Date  . COPD (chronic obstructive pulmonary disease)   . Allergy   . Diabetes mellitus   . Emphysema of lung   . GERD (gastroesophageal reflux disease)   . Hypertension   . Hyperlipidemia   . CAD (coronary artery disease)     a. s/p multiple PCIs to RCA and eventual CABG in 1999;  b.LHC 11/2003: CFX 95%, RCA occluded, SVG-RCA occluded, SVG-OM patent, native LAD patent. EF was 55%.  Patient had left to right collaterals and medical therapy was recommended    . Anxiety   . PVD (peripheral vascular disease)   . OA (osteoarthritis)   . Asthma   . Iron deficiency anemia   . Memory loss   . Insomnia   . DDD (degenerative disc disease), lumbar   . OSA (obstructive sleep apnea)   . Depression   . Benign prostatic hypertrophy   . Lumbar back pain     chronic  . Spinal stenosis   . Herniated disc   . History of colonic polyps   . Diverticulitis, colon   . Esophageal stricture   . Hx of colonoscopy   . Hiatal hernia   . HOH (hard of hearing)     Current Outpatient Prescriptions  Medication Sig Dispense Refill  . ALPRAZolam (XANAX) 0.25 MG tablet take 1 tablet by mouth twice a day if needed  60 tablet  2  . AMBULATORY NON FORMULARY MEDICATION 2 Liters home oxygen continuous      . amLODipine (NORVASC) 2.5 MG tablet Take 1 tablet (2.5 mg total) by mouth daily.  30 tablet  3  . Aspirin (ADULT ASPIRIN LOW STRENGTH) 81 MG EC tablet Take 81 mg by mouth daily.       . clonazePAM (KLONOPIN) 0.5 MG tablet take 1 tablet by mouth three times a day if needed for anxiety  90 tablet  3  . docusate  sodium (COLACE) 100 MG capsule Take 200 mg by mouth daily.       Marland Kitchen donepezil (ARICEPT) 5 MG tablet take 1 take tablet by mouth at bedtime  30 tablet  6  . DULoxetine (CYMBALTA) 60 MG capsule Take 1 capsule (60 mg total) by mouth daily.  180 capsule  3  . ferrous sulfate 325 (65 FE) MG EC tablet Take 325 mg by mouth daily.       Marland Kitchen glipiZIDE (GLUCOTROL XL) 5 MG 24 hr tablet Take 5 mg by mouth daily as needed. 1 tab by mouth daily prn blood sugar > 180      . GLIPIZIDE XL 2.5 MG 24 hr tablet take 1 tablet by mouth once daily ONLY FOR BLOOD SUGAR MORE THAN 180  30 each  11  . HYDROcodone-acetaminophen (LORCET) 10-650 MG per tablet Take 1 tablet by mouth every 6 (six) hours as needed. For pain      . metFORMIN (GLUCOPHAGE) 500 MG tablet Take 1,000 mg by mouth 2 (two) times daily with a meal. 2 tabs by mouth twice per day      . Multiple Vitamin (MULTIVITAMIN) capsule Take 1 capsule by mouth  daily.       . nitroGLYCERIN (NITROSTAT) 0.4 MG SL tablet Place 1 tablet (0.4 mg total) under the tongue every 5 (five) minutes as needed. For chest pain  25 tablet  5  . omeprazole (PRILOSEC) 20 MG capsule Take 20 mg by mouth daily.       Marland Kitchen oxyCODONE (OXYCONTIN) 80 MG 12 hr tablet Take 320 mg by mouth every 12 (twelve) hours. Taking 4 tabs twice daily      . pravastatin (PRAVACHOL) 80 MG tablet take 1 tablet by mouth once daily  90 tablet  3  . PROAIR HFA 108 (90 BASE) MCG/ACT inhaler inhale 2 puffs by mouth four times a day if needed  8.5 g  2  . zolpidem (AMBIEN) 10 MG tablet Take 10 mg by mouth at bedtime as needed. For sleep       Current Facility-Administered Medications  Medication Dose Route Frequency Provider Last Rate Last Dose  . dextrose 5 % solution   Intravenous Continuous Irene Shipper, MD        Allergies  Allergen Reactions  . Celecoxib     REACTION: GI upset  . Morphine     REACTION: itching  . Nalbuphine     REACTION: contraindication with oxycontin.  . Prednisone     REACTION: "yeast  infections"  . Venlafaxine     REACTION: GI upset    Family History  Problem Relation Age of Onset  . Stroke Father   . Emphysema Father   . Heart disease    . Hypertension    . Hyperlipidemia    . Kidney cancer Daughter   . Allergies    . Colon cancer Neg Hx   . Diabetes      History   Social History  . Marital Status: Married    Spouse Name: N/A    Number of Children: 52  . Years of Education: N/A   Occupational History  . Retired-construction    Social History Main Topics  . Smoking status: Current Every Day Smoker -- 1.00 packs/day for 40 years    Types: Cigarettes  . Smokeless tobacco: Current User  . Alcohol Use: No  . Drug Use: No  . Sexually Active: Not on file   Other Topics Concern  . Not on file   Social History Narrative  . No narrative on file    ROS ALL NEGATIVE EXCEPT THOSE NOTED IN HPI  PE  General Appearance: well developed, well nourished in no acute distress, looks older than stated age 72: symmetrical face, PERRLA,  Neck: no JVD, thyromegaly, or adenopathy, trachea midline Chest: symmetric without deformity Cardiac: PMI non-displaced, RRR, soft S1-S2 no gallop or murmur Lung: Inspiratory expiratory rhonchi and wheezes which is chronic Vascular: Diminished pulses in the lower extremities, significantly reduced capillary refill, dependent rubor Abdominal: nondistended, nontender, good bowel sounds, no HSM, no bruits Extremities: no cyanosis, clubbing or edema, no sign of DVT, no varicosities  Skin: normal color, no rashes Neuro: alert and oriented x 3, non-focal Pysch: normal affect  EKG  BMET    Component Value Date/Time   NA 139 12/18/2011 1547   K 5.5* 12/18/2011 1547   CL 104 12/18/2011 1547   CO2 30 12/18/2011 1547   GLUCOSE 107* 12/18/2011 1547   BUN 20 12/18/2011 1547   CREATININE 1.1 12/18/2011 1547   CALCIUM 9.1 12/18/2011 1547   GFRNONAA 72* 08/21/2011 1426   GFRAA 83* 08/21/2011 1426    Lipid Panel  Component  Value Date/Time   CHOL 155 01/17/2011 1630   TRIG 176.0* 01/17/2011 1630   HDL 34.90* 01/17/2011 1630   CHOLHDL 4 01/17/2011 1630   VLDL 35.2 01/17/2011 1630   LDLCALC 85 01/17/2011 1630    CBC    Component Value Date/Time   WBC 6.4 12/18/2011 1547   RBC 3.97* 12/18/2011 1547   HGB 12.8* 12/18/2011 1547   HCT 38.6* 12/18/2011 1547   PLT 219.0 12/18/2011 1547   MCV 97.3 12/18/2011 1547   MCH 32.1 08/21/2011 1426   MCHC 33.1 12/18/2011 1547   RDW 13.9 12/18/2011 1547   LYMPHSABS 2.3 12/18/2011 1547   MONOABS 0.7 12/18/2011 1547   EOSABS 0.5 12/18/2011 1547   BASOSABS 0.0 12/18/2011 1547

## 2012-10-07 NOTE — Assessment & Plan Note (Signed)
Stable. Continue secondary preventative therapy.

## 2012-10-16 ENCOUNTER — Encounter (HOSPITAL_COMMUNITY): Payer: Self-pay | Admitting: Adult Health

## 2012-10-16 ENCOUNTER — Emergency Department (HOSPITAL_COMMUNITY): Payer: Medicare Other

## 2012-10-16 ENCOUNTER — Inpatient Hospital Stay (HOSPITAL_COMMUNITY)
Admission: EM | Admit: 2012-10-16 | Discharge: 2012-10-19 | DRG: 190 | Disposition: A | Discharge status: Home / Self Care | Payer: Medicare Other | Attending: Internal Medicine | Admitting: Internal Medicine

## 2012-10-16 DIAGNOSIS — R5383 Other fatigue: Secondary | ICD-10-CM

## 2012-10-16 DIAGNOSIS — M4800 Spinal stenosis, site unspecified: Secondary | ICD-10-CM

## 2012-10-16 DIAGNOSIS — G4710 Hypersomnia, unspecified: Secondary | ICD-10-CM

## 2012-10-16 DIAGNOSIS — E119 Type 2 diabetes mellitus without complications: Secondary | ICD-10-CM

## 2012-10-16 DIAGNOSIS — Z0000 Encounter for general adult medical examination without abnormal findings: Secondary | ICD-10-CM

## 2012-10-16 DIAGNOSIS — M51379 Other intervertebral disc degeneration, lumbosacral region without mention of lumbar back pain or lower extremity pain: Secondary | ICD-10-CM

## 2012-10-16 DIAGNOSIS — Z9981 Dependence on supplemental oxygen: Secondary | ICD-10-CM | POA: Diagnosis not present

## 2012-10-16 DIAGNOSIS — R3911 Hesitancy of micturition: Secondary | ICD-10-CM

## 2012-10-16 DIAGNOSIS — I10 Essential (primary) hypertension: Secondary | ICD-10-CM

## 2012-10-16 DIAGNOSIS — J9620 Acute and chronic respiratory failure, unspecified whether with hypoxia or hypercapnia: Secondary | ICD-10-CM | POA: Diagnosis not present

## 2012-10-16 DIAGNOSIS — N471 Phimosis: Secondary | ICD-10-CM

## 2012-10-16 DIAGNOSIS — M545 Low back pain, unspecified: Secondary | ICD-10-CM

## 2012-10-16 DIAGNOSIS — J438 Other emphysema: Secondary | ICD-10-CM | POA: Diagnosis not present

## 2012-10-16 DIAGNOSIS — K222 Esophageal obstruction: Secondary | ICD-10-CM

## 2012-10-16 DIAGNOSIS — Z96659 Presence of unspecified artificial knee joint: Secondary | ICD-10-CM | POA: Diagnosis not present

## 2012-10-16 DIAGNOSIS — I739 Peripheral vascular disease, unspecified: Secondary | ICD-10-CM | POA: Diagnosis present

## 2012-10-16 DIAGNOSIS — F0390 Unspecified dementia without behavioral disturbance: Secondary | ICD-10-CM | POA: Diagnosis present

## 2012-10-16 DIAGNOSIS — Z9861 Coronary angioplasty status: Secondary | ICD-10-CM | POA: Diagnosis not present

## 2012-10-16 DIAGNOSIS — F3289 Other specified depressive episodes: Secondary | ICD-10-CM

## 2012-10-16 DIAGNOSIS — R141 Gas pain: Secondary | ICD-10-CM

## 2012-10-16 DIAGNOSIS — F17200 Nicotine dependence, unspecified, uncomplicated: Secondary | ICD-10-CM | POA: Diagnosis present

## 2012-10-16 DIAGNOSIS — D126 Benign neoplasm of colon, unspecified: Secondary | ICD-10-CM

## 2012-10-16 DIAGNOSIS — H9190 Unspecified hearing loss, unspecified ear: Secondary | ICD-10-CM | POA: Diagnosis present

## 2012-10-16 DIAGNOSIS — I498 Other specified cardiac arrhythmias: Secondary | ICD-10-CM | POA: Diagnosis present

## 2012-10-16 DIAGNOSIS — N320 Bladder-neck obstruction: Secondary | ICD-10-CM

## 2012-10-16 DIAGNOSIS — R413 Other amnesia: Secondary | ICD-10-CM

## 2012-10-16 DIAGNOSIS — E875 Hyperkalemia: Secondary | ICD-10-CM

## 2012-10-16 DIAGNOSIS — M5137 Other intervertebral disc degeneration, lumbosacral region: Secondary | ICD-10-CM | POA: Diagnosis not present

## 2012-10-16 DIAGNOSIS — R079 Chest pain, unspecified: Secondary | ICD-10-CM

## 2012-10-16 DIAGNOSIS — J441 Chronic obstructive pulmonary disease with (acute) exacerbation: Secondary | ICD-10-CM | POA: Diagnosis not present

## 2012-10-16 DIAGNOSIS — R0789 Other chest pain: Secondary | ICD-10-CM | POA: Diagnosis present

## 2012-10-16 DIAGNOSIS — R1319 Other dysphagia: Secondary | ICD-10-CM

## 2012-10-16 DIAGNOSIS — R5381 Other malaise: Secondary | ICD-10-CM

## 2012-10-16 DIAGNOSIS — F411 Generalized anxiety disorder: Secondary | ICD-10-CM | POA: Diagnosis present

## 2012-10-16 DIAGNOSIS — I2510 Atherosclerotic heart disease of native coronary artery without angina pectoris: Secondary | ICD-10-CM | POA: Diagnosis present

## 2012-10-16 DIAGNOSIS — G4733 Obstructive sleep apnea (adult) (pediatric): Secondary | ICD-10-CM | POA: Diagnosis present

## 2012-10-16 DIAGNOSIS — Z951 Presence of aortocoronary bypass graft: Secondary | ICD-10-CM

## 2012-10-16 DIAGNOSIS — R1084 Generalized abdominal pain: Secondary | ICD-10-CM

## 2012-10-16 DIAGNOSIS — J449 Chronic obstructive pulmonary disease, unspecified: Secondary | ICD-10-CM

## 2012-10-16 DIAGNOSIS — J42 Unspecified chronic bronchitis: Secondary | ICD-10-CM | POA: Diagnosis not present

## 2012-10-16 DIAGNOSIS — J189 Pneumonia, unspecified organism: Secondary | ICD-10-CM

## 2012-10-16 DIAGNOSIS — F329 Major depressive disorder, single episode, unspecified: Secondary | ICD-10-CM

## 2012-10-16 DIAGNOSIS — K219 Gastro-esophageal reflux disease without esophagitis: Secondary | ICD-10-CM

## 2012-10-16 DIAGNOSIS — J9611 Chronic respiratory failure with hypoxia: Secondary | ICD-10-CM | POA: Diagnosis present

## 2012-10-16 DIAGNOSIS — IMO0002 Reserved for concepts with insufficient information to code with codable children: Secondary | ICD-10-CM

## 2012-10-16 DIAGNOSIS — N478 Other disorders of prepuce: Secondary | ICD-10-CM

## 2012-10-16 DIAGNOSIS — D509 Iron deficiency anemia, unspecified: Secondary | ICD-10-CM | POA: Diagnosis present

## 2012-10-16 DIAGNOSIS — G4700 Insomnia, unspecified: Secondary | ICD-10-CM

## 2012-10-16 DIAGNOSIS — J45909 Unspecified asthma, uncomplicated: Secondary | ICD-10-CM

## 2012-10-16 DIAGNOSIS — R51 Headache: Secondary | ICD-10-CM

## 2012-10-16 DIAGNOSIS — M5430 Sciatica, unspecified side: Secondary | ICD-10-CM

## 2012-10-16 DIAGNOSIS — R1013 Epigastric pain: Secondary | ICD-10-CM

## 2012-10-16 DIAGNOSIS — R634 Abnormal weight loss: Secondary | ICD-10-CM

## 2012-10-16 DIAGNOSIS — J9610 Chronic respiratory failure, unspecified whether with hypoxia or hypercapnia: Secondary | ICD-10-CM | POA: Diagnosis not present

## 2012-10-16 DIAGNOSIS — M1990 Unspecified osteoarthritis, unspecified site: Secondary | ICD-10-CM | POA: Diagnosis present

## 2012-10-16 DIAGNOSIS — J209 Acute bronchitis, unspecified: Secondary | ICD-10-CM

## 2012-10-16 DIAGNOSIS — E785 Hyperlipidemia, unspecified: Secondary | ICD-10-CM

## 2012-10-16 DIAGNOSIS — R109 Unspecified abdominal pain: Secondary | ICD-10-CM

## 2012-10-16 DIAGNOSIS — R630 Anorexia: Secondary | ICD-10-CM

## 2012-10-16 DIAGNOSIS — K5730 Diverticulosis of large intestine without perforation or abscess without bleeding: Secondary | ICD-10-CM

## 2012-10-16 DIAGNOSIS — I209 Angina pectoris, unspecified: Secondary | ICD-10-CM

## 2012-10-16 DIAGNOSIS — N400 Enlarged prostate without lower urinary tract symptoms: Secondary | ICD-10-CM

## 2012-10-16 DIAGNOSIS — K5900 Constipation, unspecified: Secondary | ICD-10-CM

## 2012-10-16 DIAGNOSIS — J310 Chronic rhinitis: Secondary | ICD-10-CM

## 2012-10-16 LAB — CBC
HCT: 39.9 % (ref 39.0–52.0)
Hemoglobin: 13.8 g/dL (ref 13.0–17.0)
MCH: 32.5 pg (ref 26.0–34.0)
MCV: 94.1 fL (ref 78.0–100.0)
Platelets: 231 10*3/uL (ref 150–400)
RBC: 4.24 MIL/uL (ref 4.22–5.81)
WBC: 15.4 10*3/uL — ABNORMAL HIGH (ref 4.0–10.5)

## 2012-10-16 LAB — POCT I-STAT TROPONIN I: Troponin i, poc: 0 ng/mL (ref 0.00–0.08)

## 2012-10-16 LAB — BASIC METABOLIC PANEL
BUN: 22 mg/dL (ref 6–23)
CO2: 29 mEq/L (ref 19–32)
Calcium: 9.7 mg/dL (ref 8.4–10.5)
Chloride: 98 mEq/L (ref 96–112)
Creatinine, Ser: 1.03 mg/dL (ref 0.50–1.35)
Glucose, Bld: 149 mg/dL — ABNORMAL HIGH (ref 70–99)

## 2012-10-16 MED ORDER — ASPIRIN 81 MG PO CHEW
324.0000 mg | CHEWABLE_TABLET | Freq: Once | ORAL | Status: AC
  Administered 2012-10-17: 324 mg via ORAL
  Filled 2012-10-16: qty 4

## 2012-10-16 MED ORDER — ALBUTEROL SULFATE (5 MG/ML) 0.5% IN NEBU
5.0000 mg | INHALATION_SOLUTION | Freq: Once | RESPIRATORY_TRACT | Status: AC
  Administered 2012-10-17: 5 mg via RESPIRATORY_TRACT
  Filled 2012-10-16 (×2): qty 1

## 2012-10-16 MED ORDER — IPRATROPIUM BROMIDE 0.02 % IN SOLN
0.5000 mg | Freq: Once | RESPIRATORY_TRACT | Status: AC
  Administered 2012-10-17: 0.5 mg via RESPIRATORY_TRACT
  Filled 2012-10-16 (×2): qty 2.5

## 2012-10-16 MED ORDER — FENTANYL CITRATE 0.05 MG/ML IJ SOLN: 50.0000 ug | Freq: Once | INTRAMUSCULAR | Status: DC

## 2012-10-16 NOTE — ED Notes (Signed)
Patient to xray at this time

## 2012-10-16 NOTE — ED Provider Notes (Signed)
History    CSN: [id number] Arrival date & time 10/16/12  2253  First MD Initiated Contact with Patient 10/16/12 2306     Chief Complaint  Patient presents with  . Chest Pain   HPI [name]. is a 72 y.o. male presenting to the emergency department for chest pain.   patient has a pertinent medical history for coronary artery disease, status post RCA stenting in 1998, HSRA for ISR in 1999, had another CABG in 1999, also has diabetes type 2, hypertension, hyperlipidemia, continues to smoke cigarettes, COPD and peripheral arterial disease who last had a catheterization in 2005. At that time medical therapy was recommended.  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 [name] 10 days ago.  Patient presents with chest pain today associated with diaphoresis, shortness of breath, sharp and shooting star of the left side of his chest and radiated to the right side of his chest.  Denies any nausea or vomiting. Patient's had some breathing difficulty for the last couple of days, no resolution with home medications. Patient is had increased cough denies any fevers or chills. Currently pain-free. Patient also complains about left shoulder weakness. Patient says he is unable to raise his arm. Patient has normal grip and can bend at the elbow and wrist and move his fingers. No dysarthria, dysphasia, no focal neurologic deficits. Patient has a history of rotator cuff problems to  Past Medical History  Diagnosis Date  . COPD (chronic obstructive pulmonary disease)   . Allergy   . Diabetes mellitus   . Emphysema of lung   . GERD (gastroesophageal reflux disease)   . Hypertension   . Hyperlipidemia   . CAD (coronary artery disease)     a. s/p multiple PCIs to RCA and eventual CABG in 1999;  b.LHC 11/2003: CFX 95%, RCA occluded, SVG-RCA occluded, SVG-OM patent, native LAD patent. EF was 55%.  Patient had left to right collaterals and medical therapy was recommended    . Anxiety   .  PVD (peripheral vascular disease)   . OA (osteoarthritis)   . Asthma   . Iron deficiency anemia   . Memory loss   . Insomnia   . DDD (degenerative disc disease), lumbar   . OSA (obstructive sleep apnea)   . Depression   . Benign prostatic hypertrophy   . Lumbar back pain     chronic  . Spinal stenosis   . Herniated disc   . History of colonic polyps   . Diverticulitis, colon   . Esophageal stricture   . Hx of colonoscopy   . Hiatal hernia   . HOH (hard of hearing)    Past Surgical History  Procedure Laterality Date  . Heart bypass    . Bilateral knee replacements    . Hemorrhoid surgery    . Carpal tunnel release    . Shoulder arthroscopy  right  . Coronary artery bypass graft  09/1997    double bypss  . Coronary angioplasty with stent placement  2/99  . Strabismus surgery  08/29/2011    Procedure: REPAIR STRABISMUS;  Surgeon: Derry Skill, MD;  Location: [facility];  Service: Ophthalmology;  Laterality: Left;   Family History  Problem Relation Age of Onset  . Stroke Father   . Emphysema Father   . Heart disease    . Hypertension    . Hyperlipidemia    . Kidney cancer Daughter   . Allergies    . Colon cancer  Neg Hx   . Diabetes     History  Substance Use Topics  . Smoking status: Current Every Day Smoker -- 1.00 packs/day for 40 years    Types: Cigarettes  . Smokeless tobacco: Current User  . Alcohol Use: No    Review of Systems At least 10pt or greater review of systems completed and are negative except where specified in the HPI.  Allergies  Celecoxib; Morphine; Nalbuphine; Prednisone; and Venlafaxine  Home Medications   Current Outpatient Rx  Name  Route  Sig  Dispense  Refill  . ALPRAZolam (XANAX) 0.25 MG tablet      take 1 tablet by mouth twice a day if needed   60 tablet   2   . AMBULATORY NON FORMULARY MEDICATION      2 Liters home oxygen continuous         . amLODipine (NORVASC) 2.5 MG tablet   Oral   Take 1 tablet (2.5 mg total) by  mouth daily.   30 tablet   3   . Aspirin (ADULT ASPIRIN LOW STRENGTH) 81 MG EC tablet   Oral   Take 81 mg by mouth daily.          . clonazePAM (KLONOPIN) 0.5 MG tablet      take 1 tablet by mouth three times a day if needed for anxiety   90 tablet   3   . docusate sodium (COLACE) 100 MG capsule   Oral   Take 200 mg by mouth daily.          Marland Kitchen donepezil (ARICEPT) 5 MG tablet      take 1 take tablet by mouth at bedtime   30 tablet   6   . DULoxetine (CYMBALTA) 60 MG capsule   Oral   Take 1 capsule (60 mg total) by mouth daily.   180 capsule   3   . ferrous sulfate 325 (65 FE) MG EC tablet   Oral   Take 325 mg by mouth daily.          Marland Kitchen glipiZIDE (GLUCOTROL XL) 5 MG 24 hr tablet   Oral   Take 5 mg by mouth daily as needed. 1 tab by mouth daily prn blood sugar > 180         . GLIPIZIDE XL 2.5 MG 24 hr tablet      take 1 tablet by mouth once daily ONLY FOR BLOOD SUGAR MORE THAN 180   30 each   11   . HYDROcodone-acetaminophen (LORCET) 10-650 MG per tablet   Oral   Take 1 tablet by mouth every 6 (six) hours as needed. For pain         . metFORMIN (GLUCOPHAGE) 500 MG tablet   Oral   Take 1,000 mg by mouth 2 (two) times daily with a meal. 2 tabs by mouth twice per day         . Multiple Vitamin (MULTIVITAMIN) capsule   Oral   Take 1 capsule by mouth daily.          . nitroGLYCERIN (NITROSTAT) 0.4 MG SL tablet   Sublingual   Place 1 tablet (0.4 mg total) under the tongue every 5 (five) minutes as needed. For chest pain   25 tablet   5   . omeprazole (PRILOSEC) 20 MG capsule   Oral   Take 20 mg by mouth daily.          Marland Kitchen oxyCODONE (OXYCONTIN) 80 MG 12 hr  tablet   Oral   Take 320 mg by mouth every 12 (twelve) hours. Taking 4 tabs twice daily         . pravastatin (PRAVACHOL) 80 MG tablet      take 1 tablet by mouth once daily   90 tablet   3   . PROAIR HFA 108 (90 BASE) MCG/ACT inhaler      inhale 2 puffs by mouth four times a day  if needed   8.5 g   2   . zolpidem (AMBIEN) 10 MG tablet   Oral   Take 10 mg by mouth at bedtime as needed. For sleep          BP 114/55  Pulse 95  Temp(Src) 98.8 F (37.1 C) (Oral)  Resp 24  Ht 5\' 8"  (1.727 m)  Wt 165 lb (74.844 kg)  BMI 25.09 kg/m2  SpO2 88% Physical Exam  Nursing notes reviewed.  Electronic medical record reviewed. VITAL SIGNS:   Filed Vitals:   10/16/12 2257  BP: 114/55  Pulse: 95  Temp: 98.8 F (37.1 C)  TempSrc: Oral  Resp: 24  Height: 5\' 8"  (1.727 m)  Weight: 165 lb (74.844 kg)  SpO2: 88%   CONSTITUTIONAL: Awake, oriented, appears non-toxic, hard of hearing HENT: Atraumatic, normocephalic, oral mucosa pink and moist, airway patent. Nares patent without drainage. External ears normal. EYES: Conjunctiva clear, EOMI, PERRLA NECK: Trachea midline, non-tender, supple CARDIOVASCULAR: Normal heart rate, Normal rhythm, difficult to auscultate heart with loud lung sounds, well-healed median sternotomy scar.  PULMONARY/CHEST: Wheezing throughout, prolonged expiratory phase Non-tender. ABDOMINAL: Non-distended, soft, non-tender - no rebound or guarding.  BS normal. NEUROLOGIC: Non-focal, moving all four extremities, no gross sensory or motor deficits. Patient unable to raise the left arm at the shoulder, strength is 5 out of 5 flexors and extensors distal to the shoulder. EXTREMITIES: No clubbing, cyanosis, or edema SKIN: Warm, Dry, No erythema, No rash  ED Course  Procedures (including critical care time)  Date: 10/17/2012  Rate: 100  Rhythm: sinus rhythm  QRS Axis: normal  Intervals: normal  ST/T Wave abnormalities: T-wave inversions throughout the anterior lateral leads  Conduction Disutrbances: none  Narrative Interpretation: New T-wave inversions in anterior lateral leads   Labs Reviewed  CBC - Abnormal; Notable for the following:    WBC 15.4 (*)    All other components within normal limits  BASIC METABOLIC PANEL - Abnormal; Notable for  the following:    Potassium 5.6 (*)    Glucose, Bld 149 (*)    GFR calc non Af Amer 72 (*)    GFR calc Af Amer 84 (*)    All other components within normal limits  PRO B NATRIURETIC PEPTIDE  POCT I-STAT TROPONIN I   Dg Chest 2 View (if Patient Has Fever And/or Copd)  10/16/2012   *RADIOLOGY REPORT*  Clinical Data: Chest pain, shortness of breath, left arm weakness and numbness.  CHEST - 2 VIEW  Comparison: 01/17/2012  Findings: Stable appearance of postoperative changes in the mediastinum since prior study.  Normal heart size and pulmonary vascularity.  Emphysematous changes in the lungs.  Peribronchial thickening and interstitial changes consistent with chronic bronchitis.  No blunting of costophrenic angles.  No pneumothorax. Old left rib fractures.  Degenerative changes in the spine and shoulders.  Calcification of the aorta.  No significant change since previous study.  IMPRESSION: Emphysematous and chronic bronchitic changes.  No evidence of active pulmonary disease.   Original Report Authenticated By: Gwyndolyn Saxon  Gerilyn Nestle, M.D.   1. COPD exacerbation   2. Chest pain     MDM  Patient presents with coughing, shortness of breath and chest pain-patient likely a COPD exacerbation but he does have some new flipped T waves in anterolateral leads, this may be secondary to his breathing, cannot exclude ischemia, troponin is negative. Discussed with cardiology, admit to hospitalist. Patient admitted stable. Patient given steroids and azithromycin in the emergency department.  No changes on EKG consistent with hyperkalemia, potassium is 5.5 - I. do not think this is an emergency at this time, we'll defer to inpatient team for treatment. Does have history of chronic hyperkalemia.  Rhunette Croft, MD 10/17/12 [id number]

## 2012-10-16 NOTE — ED Notes (Addendum)
Present with chest pain that began today assoicated with diaphoresis and sob. Pain described as sharp and shooting from left side to right of chest. Reports inability to move left well for 3 days.  Bilateral inspiratory and expiratory wheezes.  Chest pain is intermittent, denies chest pain at this time

## 2012-10-17 ENCOUNTER — Encounter (HOSPITAL_COMMUNITY): Payer: Self-pay | Admitting: Anesthesiology

## 2012-10-17 DIAGNOSIS — F411 Generalized anxiety disorder: Secondary | ICD-10-CM

## 2012-10-17 DIAGNOSIS — J9610 Chronic respiratory failure, unspecified whether with hypoxia or hypercapnia: Secondary | ICD-10-CM | POA: Diagnosis not present

## 2012-10-17 DIAGNOSIS — R0902 Hypoxemia: Secondary | ICD-10-CM

## 2012-10-17 DIAGNOSIS — Z9981 Dependence on supplemental oxygen: Secondary | ICD-10-CM

## 2012-10-17 DIAGNOSIS — I2510 Atherosclerotic heart disease of native coronary artery without angina pectoris: Secondary | ICD-10-CM

## 2012-10-17 DIAGNOSIS — J441 Chronic obstructive pulmonary disease with (acute) exacerbation: Principal | ICD-10-CM

## 2012-10-17 DIAGNOSIS — R0789 Other chest pain: Secondary | ICD-10-CM | POA: Diagnosis not present

## 2012-10-17 DIAGNOSIS — J9611 Chronic respiratory failure with hypoxia: Secondary | ICD-10-CM | POA: Diagnosis present

## 2012-10-17 DIAGNOSIS — E875 Hyperkalemia: Secondary | ICD-10-CM

## 2012-10-17 LAB — TROPONIN I
Troponin I: <0.3 ng/mL (ref <0.30)
Troponin I: <0.3 ng/mL (ref <0.30)
Troponin I: <0.3 ng/mL (ref <0.30)

## 2012-10-17 LAB — BASIC METABOLIC PANEL
CO2: 28 mEq/L (ref 19–32)
Glucose, Bld: 186 mg/dL — ABNORMAL HIGH (ref 70–99)
Potassium: 5.5 mEq/L — ABNORMAL HIGH (ref 3.5–5.1)
Sodium: 139 mEq/L (ref 135–145)

## 2012-10-17 LAB — CBC
HCT: 39.7 % (ref 39.0–52.0)
Hemoglobin: 13.4 g/dL (ref 13.0–17.0)
MCH: 31.7 pg (ref 26.0–34.0)
MCHC: 33.8 g/dL (ref 30.0–36.0)
MCV: 93.9 fL (ref 78.0–100.0)

## 2012-10-17 LAB — HEMOGLOBIN A1C: Hgb A1c MFr Bld: 5.8 % — ABNORMAL HIGH (ref <5.7)

## 2012-10-17 LAB — GLUCOSE, CAPILLARY
Glucose-Capillary: 188 mg/dL — ABNORMAL HIGH (ref 70–99)
Glucose-Capillary: 137 mg/dL — ABNORMAL HIGH (ref 70–99)
Glucose-Capillary: 262 mg/dL — ABNORMAL HIGH (ref 70–99)
Glucose-Capillary: 231 mg/dL — ABNORMAL HIGH (ref 70–99)

## 2012-10-17 MED ORDER — ALBUTEROL SULFATE (5 MG/ML) 0.5% IN NEBU: 2.5000 mg | INHALATION_SOLUTION | RESPIRATORY_TRACT | Status: DC | PRN

## 2012-10-17 MED ORDER — AMLODIPINE BESYLATE 2.5 MG PO TABS
2.5000 mg | ORAL_TABLET | Freq: Every day | ORAL | Status: DC
Start: 2012-10-17 — End: 2012-10-19
  Administered 2012-10-17 – 2012-10-19 (×3): 2.5 mg via ORAL
  Filled 2012-10-17 (×3): qty 1

## 2012-10-17 MED ORDER — SODIUM CHLORIDE 0.9 % IJ SOLN: 3.0000 mL | INTRAMUSCULAR | Status: DC | PRN

## 2012-10-17 MED ORDER — DULOXETINE HCL 60 MG PO CPEP
60.0000 mg | ORAL_CAPSULE | Freq: Every day | ORAL | Status: DC
  Administered 2012-10-17 – 2012-10-19 (×3): 60 mg via ORAL
  Filled 2012-10-17 (×3): qty 1

## 2012-10-17 MED ORDER — ENOXAPARIN SODIUM 40 MG/0.4ML ~~LOC~~ SOLN
40.0000 mg | Freq: Every day | SUBCUTANEOUS | Status: DC
  Administered 2012-10-17 – 2012-10-19 (×3): 40 mg via SUBCUTANEOUS
  Filled 2012-10-17 (×3): qty 0.4

## 2012-10-17 MED ORDER — INSULIN ASPART 100 UNIT/ML ~~LOC~~ SOLN
0.0000 [IU] | Freq: Three times a day (TID) | SUBCUTANEOUS | Status: DC
  Administered 2012-10-17: 2 [IU] via SUBCUTANEOUS

## 2012-10-17 MED ORDER — SODIUM POLYSTYRENE SULFONATE 15 GM/60ML PO SUSP
15.0000 g | Freq: Once | ORAL | Status: AC
  Administered 2012-10-17: 15 g via ORAL
  Filled 2012-10-17: qty 60

## 2012-10-17 MED ORDER — SODIUM CHLORIDE 0.9 % IJ SOLN
3.0000 mL | Freq: Two times a day (BID) | INTRAMUSCULAR | Status: DC
  Administered 2012-10-17 – 2012-10-19 (×5): 3 mL via INTRAVENOUS

## 2012-10-17 MED ORDER — ONDANSETRON HCL 4 MG PO TABS: 4.0000 mg | ORAL_TABLET | Freq: Four times a day (QID) | ORAL | Status: DC | PRN

## 2012-10-17 MED ORDER — METHYLPREDNISOLONE SODIUM SUCC 125 MG IJ SOLR
80.0000 mg | Freq: Three times a day (TID) | INTRAMUSCULAR | Status: DC
  Administered 2012-10-17 – 2012-10-18 (×3): 80 mg via INTRAVENOUS
  Filled 2012-10-17 (×6): qty 1.28

## 2012-10-17 MED ORDER — ACETAMINOPHEN 325 MG PO TABS
650.0000 mg | ORAL_TABLET | Freq: Four times a day (QID) | ORAL | Status: DC | PRN
  Administered 2012-10-17 (×2): 650 mg via ORAL
  Filled 2012-10-17 (×2): qty 2

## 2012-10-17 MED ORDER — SODIUM CHLORIDE 0.9 % IV SOLN
INTRAVENOUS | Status: DC
  Administered 2012-10-17: 09:00:00 via INTRAVENOUS

## 2012-10-17 MED ORDER — INSULIN ASPART 100 UNIT/ML ~~LOC~~ SOLN: 0.0000 [IU] | Freq: Three times a day (TID) | SUBCUTANEOUS | Status: DC

## 2012-10-17 MED ORDER — AZITHROMYCIN 250 MG PO TABS: 250.0000 mg | ORAL_TABLET | Freq: Every day | ORAL | Status: DC

## 2012-10-17 MED ORDER — AZITHROMYCIN 250 MG PO TABS
250.0000 mg | ORAL_TABLET | Freq: Every day | ORAL | Status: DC
  Administered 2012-10-18 – 2012-10-19 (×2): 250 mg via ORAL
  Filled 2012-10-17 (×2): qty 1

## 2012-10-17 MED ORDER — AZITHROMYCIN 500 MG PO TABS: 500.0000 mg | ORAL_TABLET | Freq: Every day | ORAL | Status: DC

## 2012-10-17 MED ORDER — DONEPEZIL HCL 5 MG PO TABS
5.0000 mg | ORAL_TABLET | Freq: Every day | ORAL | Status: DC
  Administered 2012-10-17 – 2012-10-18 (×2): 5 mg via ORAL
  Filled 2012-10-17 (×3): qty 1

## 2012-10-17 MED ORDER — AZITHROMYCIN 500 MG IV SOLR
500.0000 mg | Freq: Once | INTRAVENOUS | Status: AC
  Administered 2012-10-17: 500 mg via INTRAVENOUS
  Filled 2012-10-17: qty 500

## 2012-10-17 MED ORDER — SODIUM CHLORIDE 0.9 % IV SOLN: 250.0000 mL | INTRAVENOUS | Status: DC | PRN

## 2012-10-17 MED ORDER — METHYLPREDNISOLONE SODIUM SUCC 125 MG IJ SOLR
80.0000 mg | Freq: Two times a day (BID) | INTRAMUSCULAR | Status: DC
  Administered 2012-10-17: 80 mg via INTRAVENOUS
  Filled 2012-10-17 (×2): qty 1.28

## 2012-10-17 MED ORDER — METHYLPREDNISOLONE SODIUM SUCC 125 MG IJ SOLR
125.0000 mg | Freq: Once | INTRAMUSCULAR | Status: AC
  Administered 2012-10-17: 125 mg via INTRAVENOUS
  Filled 2012-10-17: qty 2

## 2012-10-17 MED ORDER — INSULIN ASPART 100 UNIT/ML ~~LOC~~ SOLN
0.0000 [IU] | Freq: Three times a day (TID) | SUBCUTANEOUS | Status: DC
  Administered 2012-10-17: 5 [IU] via SUBCUTANEOUS
  Administered 2012-10-17: 6 [IU] via SUBCUTANEOUS
  Administered 2012-10-18: 5 [IU] via SUBCUTANEOUS
  Administered 2012-10-18: 8 [IU] via SUBCUTANEOUS
  Administered 2012-10-18: 3 [IU] via SUBCUTANEOUS
  Administered 2012-10-19: 5 [IU] via SUBCUTANEOUS

## 2012-10-17 MED ORDER — OXYCODONE-ACETAMINOPHEN 5-325 MG PO TABS
1.0000 | ORAL_TABLET | ORAL | Status: DC | PRN
  Administered 2012-10-17: 1 via ORAL
  Filled 2012-10-17: qty 1

## 2012-10-17 MED ORDER — ONDANSETRON HCL 4 MG/2ML IJ SOLN: 4.0000 mg | Freq: Three times a day (TID) | INTRAMUSCULAR | Status: DC | PRN

## 2012-10-17 MED ORDER — IPRATROPIUM BROMIDE 0.02 % IN SOLN
0.5000 mg | Freq: Four times a day (QID) | RESPIRATORY_TRACT | Status: DC
  Administered 2012-10-17 – 2012-10-18 (×7): 0.5 mg via RESPIRATORY_TRACT
  Filled 2012-10-17 (×7): qty 2.5

## 2012-10-17 MED ORDER — SODIUM CHLORIDE 0.9 % IJ SOLN
3.0000 mL | Freq: Two times a day (BID) | INTRAMUSCULAR | Status: DC
  Administered 2012-10-17 – 2012-10-18 (×2): 3 mL via INTRAVENOUS

## 2012-10-17 MED ORDER — ONDANSETRON HCL 4 MG/2ML IJ SOLN: 4.0000 mg | Freq: Four times a day (QID) | INTRAMUSCULAR | Status: DC | PRN

## 2012-10-17 MED ORDER — ALBUTEROL SULFATE (5 MG/ML) 0.5% IN NEBU
2.5000 mg | INHALATION_SOLUTION | Freq: Four times a day (QID) | RESPIRATORY_TRACT | Status: DC
  Administered 2012-10-17 – 2012-10-18 (×7): 2.5 mg via RESPIRATORY_TRACT
  Filled 2012-10-17 (×7): qty 0.5

## 2012-10-17 NOTE — ED Notes (Signed)
Pt resting and denies any pain at this time.  St's he is just sleepy.  Wife remains at bedside.

## 2012-10-17 NOTE — Progress Notes (Signed)
[facility] PROGRESS NOTE  [name] [id number] DOB: 06/16/1940 DOA: 10/16/2012 PCP: Cathlean Cower, MD  Assessment/Plan: Acute on chronic respiratory failure -Patient is option dependent on 2.5 L -Secondary to COPD exacerbation COPD exacerbation -Increase Solu-Medrol 80 mg IV q. 8 hours -continue azithromycin -Pulmonary toilet -Continue aerosolized albuterol and Atrovent Continued tobacco abuse -Tobacco cessation discussed Leukocytosis -Likely due to steroids Atypical chest pain -cycle troponins Multifocal atrial tachycardia -Heart rate is improved with treatment of COPD Hyperkalemia -IV fluids -Telemetry -Will give a dose of Kayexalate Diabetes mellitus type 2 -NovoLog sliding scale  Family Communication:   Pt at beside Disposition Plan:   Home when medically stable      Antibiotics:  Azithromycin 10/17/12>>>    Procedures/Studies: Dg Chest 2 View (if Patient Has Fever And/or Copd)  10/16/2012   *RADIOLOGY REPORT*  Clinical Data: Chest pain, shortness of breath, left arm weakness and numbness.  CHEST - 2 VIEW  Comparison: 01/17/2012  Findings: Stable appearance of postoperative changes in the mediastinum since prior study.  Normal heart size and pulmonary vascularity.  Emphysematous changes in the lungs.  Peribronchial thickening and interstitial changes consistent with chronic bronchitis.  No blunting of costophrenic angles.  No pneumothorax. Old left rib fractures.  Degenerative changes in the spine and shoulders.  Calcification of the aorta.  No significant change since previous study.  IMPRESSION: Emphysematous and chronic bronchitic changes.  No evidence of active pulmonary disease.   Original Report Authenticated By: Lucienne Capers, M.D.         Subjective: Patient feels that he is breathing somewhat better. Denies any chest discomfort now. Still has dyspnea on exertion. Denies any dizziness, nausea, vomiting, diarrhea, abdominal pain,  dysuria, hematuria  Objective: Filed Vitals:   10/17/12 0115 10/17/12 0200 10/17/12 0300 10/17/12 0323  BP: 125/71 116/71 105/38 133/70  Pulse: 98 99 88 90  Temp:    99.4 F (37.4 C)  TempSrc:    Axillary  Resp: 29 23 26 22   Height:    5\' 8"  (1.727 m)  Weight:    73.619 kg (162 lb 4.8 oz)  SpO2: 95% 96% 93% 94%   No intake or output data in the 24 hours ending 10/17/12 0844 Weight change:  Exam:   General:  Pt is alert, follows commands appropriately, not in acute distress  HEENT: No icterus, No thrush, No neck mass, [place]/AT  Cardiovascular: RRR, S1/S2, no rubs, no gallops  Respiratory: CTA bilaterally, no wheezing, no crackles, no rhonchi  Abdomen: Soft/+BS, non tender, non distended, no guarding  Extremities: No edema, No lymphangitis, No petechiae, No rashes, no synovitis  Data Reviewed: Basic Metabolic Panel:  Recent Labs Lab 10/16/12 2310 10/17/12 0630  NA 138 139  K 5.6* 5.5*  CL 98 99  CO2 29 28  GLUCOSE 149* 186*  BUN 22 20  CREATININE 1.03 0.97  CALCIUM 9.7 10.1   Liver Function Tests: No results found for this basename: AST, ALT, ALKPHOS, BILITOT, PROT, ALBUMIN,  in the last 168 hours No results found for this basename: LIPASE, AMYLASE,  in the last 168 hours No results found for this basename: AMMONIA,  in the last 168 hours CBC:  Recent Labs Lab 10/16/12 2310 10/17/12 0630  WBC 15.4* 13.5*  HGB 13.8 13.4  HCT 39.9 39.7  MCV 94.1 93.9  PLT 231 217   Cardiac Enzymes:  Recent Labs Lab 10/17/12 0630  TROPONINI <0.30   BNP: No components found with this basename: POCBNP,  CBG:  Recent Labs Lab 10/17/12 0336 10/17/12 0801  GLUCAP 188* 137*    No results found for this or any previous visit (from the past 240 hour(s)).   Scheduled Meds: . albuterol  2.5 mg Nebulization Q6H  . amLODipine  2.5 mg Oral Daily  . [START ON 10/18/2012] azithromycin  250 mg Oral Daily  . DULoxetine  60 mg Oral Daily  . enoxaparin (LOVENOX) injection   40 mg Subcutaneous Daily  . ipratropium  0.5 mg Nebulization Q6H  . methylPREDNISolone (SOLU-MEDROL) injection  80 mg Intravenous Q12H  . sodium chloride  3 mL Intravenous Q12H  . sodium chloride  3 mL Intravenous Q12H   Continuous Infusions:    Colby Reels, DO  [facility] Pager [phone]  If 7PM-7AM, please contact night-coverage [email or web address] Password [facility] 10/17/2012, 8:44 AM   LOS: 1 day

## 2012-10-17 NOTE — Progress Notes (Signed)
Pt HR 35 with 2.0 sec pause noted per CMT , pt asymptomatic ,sitting position in bed, claimed still sleepy, dont feel like eating his breakfast.Dr Tat made aware. B/p checked 142/86 mmhg denies any discomforts

## 2012-10-17 NOTE — H&P (Signed)
PCP:   Cathlean Cower, MD   Chief Complaint:  Sob, cp, wheezing  HPI: 72 yo male h/o cad/cabg, copd dependent on 2.5 L cont home oxygen, htn, tobacco abuse, dementia comes in with a day of worsening sob and wheezing.  Earlier tonight he started having some sharp pains in his left and right side of his chest with a lot of wheezing and sob.  Denies any fevers/n/v.  His wife is present with him.  No le edema or swelling.  Having no cp now.  And his breathing is much better after iv solumedrol and mult neb treatments in ED.  Review of Systems:  Positive and negative as per HPI otherwise all other systems are negative  Past Medical History: Past Medical History  Diagnosis Date  . COPD (chronic obstructive pulmonary disease)   . Allergy   . Diabetes mellitus   . Emphysema of lung   . GERD (gastroesophageal reflux disease)   . Hypertension   . Hyperlipidemia   . CAD (coronary artery disease)     a. s/p multiple PCIs to RCA and eventual CABG in 1999;  b.LHC 11/2003: CFX 95%, RCA occluded, SVG-RCA occluded, SVG-OM patent, native LAD patent. EF was 55%.  Patient had left to right collaterals and medical therapy was recommended    . Anxiety   . PVD (peripheral vascular disease)   . OA (osteoarthritis)   . Asthma   . Iron deficiency anemia   . Memory loss   . Insomnia   . DDD (degenerative disc disease), lumbar   . OSA (obstructive sleep apnea)   . Depression   . Benign prostatic hypertrophy   . Lumbar back pain     chronic  . Spinal stenosis   . Herniated disc   . History of colonic polyps   . Diverticulitis, colon   . Esophageal stricture   . Hx of colonoscopy   . Hiatal hernia   . HOH (hard of hearing)    Past Surgical History  Procedure Laterality Date  . Heart bypass    . Bilateral knee replacements    . Hemorrhoid surgery    . Carpal tunnel release    . Shoulder arthroscopy  right  . Coronary artery bypass graft  09/1997    double bypss  . Coronary angioplasty with stent  placement  2/99  . Strabismus surgery  08/29/2011    Procedure: REPAIR STRABISMUS;  Surgeon: Derry Skill, MD;  Location: [facility];  Service: Ophthalmology;  Laterality: Left;    Medications: Prior to Admission medications   Medication Sig Start Date End Date Taking? Authorizing Provider  ALPRAZolam Duanne Moron) 0.25 MG tablet take 1 tablet by mouth twice a day if needed 09/01/12  Yes Biagio Borg, MD  amLODipine (NORVASC) 2.5 MG tablet Take 1 tablet (2.5 mg total) by mouth daily. 07/01/12  Yes Liliane Shi, PA-C  Aspirin-Salicylamide-Caffeine (BC HEADACHE POWDER PO) Take 1 Package by mouth 4 (four) times daily as needed (headache or pain).   Yes Historical Provider, MD  clonazePAM (KLONOPIN) 0.5 MG tablet take 1 tablet by mouth three times a day if needed for anxiety 10/08/11  Yes Biagio Borg, MD  docusate sodium (COLACE) 100 MG capsule Take 200 mg by mouth daily.    Yes Historical Provider, MD  donepezil (ARICEPT) 5 MG tablet take 1 take tablet by mouth at bedtime 06/07/12  Yes Biagio Borg, MD  DULoxetine (CYMBALTA) 60 MG capsule Take 1 capsule (60 mg total) by  mouth daily. 12/18/11  Yes Biagio Borg, MD  ferrous sulfate 325 (65 FE) MG EC tablet Take 325 mg by mouth daily.    Yes Historical Provider, MD  glipiZIDE (GLUCOTROL XL) 5 MG 24 hr tablet Take 5 mg by mouth daily as needed. 1 tab by mouth daily prn blood sugar > 180 03/20/11  Yes Biagio Borg, MD  GLIPIZIDE XL 2.5 MG 24 hr tablet take 1 tablet by mouth once daily ONLY FOR BLOOD SUGAR MORE THAN 180 03/26/12  Yes Biagio Borg, MD  HYDROcodone-acetaminophen (LORCET) 10-650 MG per tablet Take 1 tablet by mouth every 6 (six) hours as needed (for breakthrough pain). For pain 03/20/11  Yes Biagio Borg, MD  metFORMIN (GLUCOPHAGE) 500 MG tablet Take 1,000 mg by mouth 2 (two) times daily with a meal. 2 tabs by mouth twice per day 02/06/11  Yes Biagio Borg, MD  Multiple Vitamin (MULTIVITAMIN) capsule Take 1 capsule by mouth daily.    Yes Historical  Provider, MD  omeprazole (PRILOSEC) 20 MG capsule Take 20 mg by mouth daily.    Yes Historical Provider, MD  oxyCODONE (OXYCONTIN) 80 MG 12 hr tablet Take 320-400 mg by mouth every 12 (twelve) hours. Taking 4 or 5 tablets twice daily For pain 03/20/11  Yes Biagio Borg, MD  PROAIR HFA 108 [phone] BASE) MCG/ACT inhaler inhale 2 puffs by mouth four times a day if needed 08/09/12  Yes Biagio Borg, MD  zolpidem (AMBIEN) 10 MG tablet Take 10 mg by mouth at bedtime as needed. For sleep   Yes Historical Provider, MD  AMBULATORY NON FORMULARY MEDICATION 2 Liters home oxygen continuous    Historical Provider, MD  nitroGLYCERIN (NITROSTAT) 0.4 MG SL tablet Place 1 tablet (0.4 mg total) under the tongue every 5 (five) minutes as needed. For chest pain 03/27/12   Renella Cunas, MD  pravastatin (PRAVACHOL) 80 MG tablet take 1 tablet by mouth once daily 01/10/12   Biagio Borg, MD    Allergies:   Allergies  Allergen Reactions  . Celecoxib Nausea And Vomiting    REACTION: GI upset  . Chicken Protein Other (See Comments)    Will not eat chicken  . Fish-Derived Products Other (See Comments)    Will not eat seafood of any kind  . Morphine Itching    REACTION: itching  . Nalbuphine Other (See Comments)    REACTION: contraindication with oxycontin.  . Prednisone Other (See Comments)    REACTION: "yeast infections"  . Venlafaxine Other (See Comments)    REACTION: GI upset    Social History:  reports that he has been smoking Cigarettes.  He has a 40 pack-year smoking history. He uses smokeless tobacco. He reports that he does not drink alcohol or use illicit drugs.  Family History: Family History  Problem Relation Age of Onset  . Stroke Father   . Emphysema Father   . Heart disease    . Hypertension    . Hyperlipidemia    . Kidney cancer Daughter   . Allergies    . Colon cancer Neg Hx   . Diabetes      Physical Exam: Filed Vitals:   10/16/12 2257 10/16/12 2301 10/17/12 0025  BP: 114/55     Pulse: 95    Temp: 98.8 F (37.1 C)    TempSrc: Oral    Resp: 24    Height: 5\' 8"  (1.727 m)    Weight: 74.844 kg (165 lb)  SpO2: 88% 96% 96%   General appearance: alert, cooperative and no distress Head: Normocephalic, without obvious abnormality, atraumatic Eyes: negative Nose: Nares normal. Septum midline. Mucosa normal. No drainage or sinus tenderness. Neck: no JVD and supple, symmetrical, trachea midline Lungs: wheezes bilaterally Heart: regular rate and rhythm, S1, S2 normal, no murmur, click, rub or gallop Abdomen: soft, non-tender; bowel sounds normal; no masses,  no organomegaly Extremities: extremities normal, atraumatic, no cyanosis or edema Pulses: 2+ and symmetric Skin: Skin color, texture, turgor normal. No rashes or lesions Neurologic: Grossly normal   Labs on Admission:   Recent Labs  10/16/12 2310  NA 138  K 5.6*  CL 98  CO2 29  GLUCOSE 149*  BUN 22  CREATININE 1.03  CALCIUM 9.7    Recent Labs  10/16/12 2310  WBC 15.4*  HGB 13.8  HCT 39.9  MCV 94.1  PLT 231   Radiological Exams on Admission: Dg Chest 2 View (if Patient Has Fever And/or Copd)  10/16/2012   *RADIOLOGY REPORT*  Clinical Data: Chest pain, shortness of breath, left arm weakness and numbness.  CHEST - 2 VIEW  Comparison: 01/17/2012  Findings: Stable appearance of postoperative changes in the mediastinum since prior study.  Normal heart size and pulmonary vascularity.  Emphysematous changes in the lungs.  Peribronchial thickening and interstitial changes consistent with chronic bronchitis.  No blunting of costophrenic angles.  No pneumothorax. Old left rib fractures.  Degenerative changes in the spine and shoulders.  Calcification of the aorta.  No significant change since previous study.  IMPRESSION: Emphysematous and chronic bronchitic changes.  No evidence of active pulmonary disease.   Original Report Authenticated By: Lucienne Capers, M.D.    Assessment/Plan  72 yo male with  mild copd exacerbation and atypical cp with h/o cad, oxygen dependency Principal Problem:   COPD exacerbation Active Problems:   ANXIETY   TOBACCO USER   CAD   Chest pain, atypical   Chronic respiratory failure with hypoxia   On home oxygen therapy  Place on tele romi.  Place on iv solumedrol and zpack.  cxr is neg for infiltrate.  ekg nonacute, his cardiac issues are being medically managed.  Cardiology has also been consulted.  Chest pain is atypical and likely related to his underlying copd exacerbation.  His o2 sats are good on his 2.5 Liters of oxygen.  obs overnight.  Tele monitoring.  DAVID,RACHAL A 10/17/2012, 2:37 AM

## 2012-10-17 NOTE — Progress Notes (Signed)
UR Completed.  [name] [id number] 10/17/2012

## 2012-10-18 DIAGNOSIS — E875 Hyperkalemia: Secondary | ICD-10-CM

## 2012-10-18 DIAGNOSIS — F411 Generalized anxiety disorder: Secondary | ICD-10-CM | POA: Diagnosis not present

## 2012-10-18 DIAGNOSIS — Z9981 Dependence on supplemental oxygen: Secondary | ICD-10-CM | POA: Diagnosis not present

## 2012-10-18 DIAGNOSIS — M5137 Other intervertebral disc degeneration, lumbosacral region: Secondary | ICD-10-CM | POA: Diagnosis not present

## 2012-10-18 DIAGNOSIS — J9620 Acute and chronic respiratory failure, unspecified whether with hypoxia or hypercapnia: Secondary | ICD-10-CM | POA: Diagnosis not present

## 2012-10-18 DIAGNOSIS — J441 Chronic obstructive pulmonary disease with (acute) exacerbation: Secondary | ICD-10-CM | POA: Diagnosis not present

## 2012-10-18 LAB — BASIC METABOLIC PANEL
CO2: 27 mEq/L (ref 19–32)
Calcium: 9 mg/dL (ref 8.4–10.5)
Chloride: 97 mEq/L (ref 96–112)
Creatinine, Ser: 1.11 mg/dL (ref 0.50–1.35)
Glucose, Bld: 210 mg/dL — ABNORMAL HIGH (ref 70–99)
Sodium: 134 mEq/L — ABNORMAL LOW (ref 135–145)

## 2012-10-18 LAB — GLUCOSE, CAPILLARY: Glucose-Capillary: 187 mg/dL — ABNORMAL HIGH (ref 70–99)

## 2012-10-18 MED ORDER — METHYLPREDNISOLONE SODIUM SUCC 125 MG IJ SOLR
60.0000 mg | Freq: Two times a day (BID) | INTRAMUSCULAR | Status: DC
  Administered 2012-10-18 – 2012-10-19 (×2): 60 mg via INTRAVENOUS
  Filled 2012-10-18 (×4): qty 0.96

## 2012-10-18 MED ORDER — NICOTINE 21 MG/24HR TD PT24
21.0000 mg | MEDICATED_PATCH | Freq: Every day | TRANSDERMAL | Status: DC
  Administered 2012-10-18 – 2012-10-19 (×2): 21 mg via TRANSDERMAL
  Filled 2012-10-18 (×2): qty 1

## 2012-10-18 MED ORDER — IPRATROPIUM BROMIDE 0.02 % IN SOLN
0.5000 mg | Freq: Four times a day (QID) | RESPIRATORY_TRACT | Status: DC
  Administered 2012-10-19: 0.5 mg via RESPIRATORY_TRACT
  Filled 2012-10-18: qty 2.5

## 2012-10-18 MED ORDER — OXYCODONE HCL ER 10 MG PO T12A
40.0000 mg | EXTENDED_RELEASE_TABLET | Freq: Two times a day (BID) | ORAL | Status: DC
  Administered 2012-10-18 – 2012-10-19 (×3): 40 mg via ORAL
  Filled 2012-10-18 (×4): qty 4

## 2012-10-18 MED ORDER — ALBUTEROL SULFATE (5 MG/ML) 0.5% IN NEBU
2.5000 mg | INHALATION_SOLUTION | Freq: Four times a day (QID) | RESPIRATORY_TRACT | Status: DC
  Administered 2012-10-19: 2.5 mg via RESPIRATORY_TRACT
  Filled 2012-10-18: qty 0.5

## 2012-10-18 MED ORDER — ALUM & MAG HYDROXIDE-SIMETH 200-200-20 MG/5ML PO SUSP
30.0000 mL | Freq: Four times a day (QID) | ORAL | Status: DC | PRN
  Administered 2012-10-18: 30 mL via ORAL

## 2012-10-18 MED ORDER — POTASSIUM CHLORIDE CRYS ER 10 MEQ PO TBCR
10.0000 meq | EXTENDED_RELEASE_TABLET | Freq: Once | ORAL | Status: AC
  Administered 2012-10-18: 10 meq via ORAL
  Filled 2012-10-18: qty 1

## 2012-10-18 NOTE — Discharge Summary (Signed)
Physician Discharge Summary  Tyler FITTS Sr. [id number] DOB: 1941/02/18 DOA: 10/16/2012  PCP: Cathlean Cower, MD  Admit date: 10/16/2012 Discharge date: 10/19/2012  Recommendations for Outpatient Follow-up:  1. Pt will need to follow up with PCP in 2 weeks post discharge 2. Please obtain BMP to evaluate electrolytes and kidney function 3. Please also check CBC to evaluate Hg and Hct levels   Discharge Diagnoses:  Principal Problem:   COPD exacerbation Active Problems:   ANXIETY   TOBACCO USER   CAD   Chest pain, atypical   Chronic respiratory failure with hypoxia   On home oxygen therapy   Hyperkalemia Acute on chronic respiratory failure  -Patient is option dependent on 2.5 L  -Secondary to COPD exacerbation  -May also have been due to the patient's opioid use-He states that he takes OxyContin 80 mg tablets--4 tablets twice a day  -His opioids were initially discontinued because of lethargy at the time of admission.  -The patient's mentation significantly improved and returned to baseline.  -The patient demanded to be started back on his home dose of OxyContin  -His dose of OxyContin was gradually increased to due to concerns for oversedation. His pain was controlled.  -The patient only received OxyContin 40 mg twice a day during his hospitalization. He stated his pain was controlled -Patient was instructed to go home with his OxyContin 80 mg one tablet twice a day as his pain was fairly well controlled--he was instructed to follow up with his pain management physician for further titration of his opioid therapy  COPD exacerbation  -Patient's IV Solu-Medrol was initially increased to 80 mg every 8 hours -Decrease IV Solu-Medrol to 60 mg twice a day-10/18/2012 -He was transitioned to oral prednisone  -He'll go home with prednisone 60 mg to  decrease by 10 mg daily  -continue azithromycin for 2 additional days -Pulmonary toilet  -Continue aerosolized albuterol and Atrovent   -The patient already had an albuterol MDI rescue inhaler. He will be sent home with Spiriva 1 inhalation daily. Continued tobacco abuse  -Tobacco cessation discussed  -Nicoderm patch  Leukocytosis  -Likely due to steroids  Atypical chest pain  -cycle troponins--negative  Multifocal atrial tachycardia  -Heart rate is improved with treatment of COPD  -Has converted back to sinus rhythm with treatment of his COPD and improvement of his electrolytes  Hyperkalemia  -K improved with IV fluids and ayexalate  Diabetes mellitus type 2  -NovoLog sliding scale  -Discontinue metformin until discharge  -Patient will restart metformin at the time of discharge.  -Hemoglobin A1c 5.8  Family Communication: wife at beside  Disposition Plan: Home when medically stable  Antibiotics:  Azithromycin 10/17/2012>>>   Discharge Condition: Stable  Disposition:  discharge home  Diet: Carbohydrate modified Wt Readings from Last 3 Encounters:  10/17/12 73.619 kg (162 lb 4.8 oz)  10/07/12 76.658 kg (169 lb)  07/01/12 76.386 kg (168 lb 6.4 oz)    History of present illness:  72 yo male h/o cad/cabg, copd dependent on 2.5 L cont home oxygen, htn, tobacco abuse, dementia comes in with a day of worsening sob and wheezing. On the night prior to admission, he started having some sharp pains in his left and right side of his chest with a lot of wheezing and sob. Denies any fevers/n/v. His wife is present with him. No le edema or swelling.  And his breathing is much better after iv solumedrol and mult neb treatments in ED. the patient was continued on IV  Solu-Medrol and aerosolized albuterol and Atrovent. He was initially noted to be lethargic. His opioids were discontinued. The treatment of his COPD exacerbation and holding his opioids, the patient's mentation significantly improved back to baseline. His antihypertensive regimen was continued. His blood pressure remained stable. the patient was restarted back on  OxyContin 40 mg, 1 tablet twice a day .he stated that his pain was fairly well controlled. This was much less than his usual 320 mg twice a day. The patient will be discharged home with 80 mg twice a day. He was instructed to follow up with his primary pain management physician for further titration of his opioid regimen. This was explained to the patient and he expressed understanding.       Discharge Exam: Filed Vitals:   10/19/12 0500  BP: 147/105  Pulse: 51  Temp: 97.7 F (36.5 C)  Resp: 20   Filed Vitals:   10/18/12 1438 10/18/12 2037 10/18/12 2100 10/19/12 0500  BP:   139/98 147/105  Pulse:  76 73 51  Temp:   98.9 F (37.2 C) 97.7 F (36.5 C)  TempSrc:      Resp:  18 20 20   Height:      Weight:      SpO2: 93% 94% 92% 94%   General: A&O x 3, NAD, pleasant, cooperative Cardiovascular: RRR, no rub, no gallop, no S3 Respiratory: Diminished breath sounds at the bases. No wheezes. Good air movement. Abdomen:soft, nontender, nondistended, positive bowel sounds Extremities: No edema, No lymphangitis, no petechiae  Discharge Instructions      Discharge Orders   Future Orders Complete By Expires     Diet - low sodium heart healthy  As directed     Discharge instructions  As directed     Comments:      OxyContin 80 mg--take 1 tablet two times a day at the time of discharge as this has relieved your pain Followup with your primary pain management Doctor for continued adjustment of ear pain medicine. Prednisone 60 mg (6 tablets) -start 10/20/2012 and decrease by one tablet daily-    Increase activity slowly  As directed         Medication List    STOP taking these medications       clonazePAM 0.5 MG tablet  Commonly known as:  KLONOPIN     oxyCODONE 80 MG 12 hr tablet  Commonly known as:  OXYCONTIN      TAKE these medications       ALPRAZolam 0.25 MG tablet  Commonly known as:  XANAX  take 1 tablet by mouth twice a day if needed     AMBULATORY NON FORMULARY  MEDICATION  2 Liters home oxygen continuous     amLODipine 2.5 MG tablet  Commonly known as:  NORVASC  Take 1 tablet (2.5 mg total) by mouth daily.     azithromycin 250 MG tablet  Commonly known as:  ZITHROMAX  Take 1 tablet (250 mg total) by mouth daily.     BC HEADACHE POWDER PO  Take 1 Package by mouth 4 (four) times daily as needed (headache or pain).     docusate sodium 100 MG capsule  Commonly known as:  COLACE  Take 200 mg by mouth daily.     donepezil 5 MG tablet  Commonly known as:  ARICEPT  take 1 take tablet by mouth at bedtime     DULoxetine 60 MG capsule  Commonly known as:  CYMBALTA  Take 1 capsule (60  mg total) by mouth daily.     ferrous sulfate 325 (65 FE) MG EC tablet  Take 325 mg by mouth daily.     GLIPIZIDE XL 2.5 MG 24 hr tablet  Generic drug:  glipiZIDE  take 1 tablet by mouth once daily ONLY FOR BLOOD SUGAR MORE THAN 180     HYDROcodone-acetaminophen 10-650 MG per tablet  Commonly known as:  LORCET  Take 1 tablet by mouth every 6 (six) hours as needed (for breakthrough pain). For pain     metFORMIN 500 MG tablet  Commonly known as:  GLUCOPHAGE  Take 1,000 mg by mouth 2 (two) times daily with a meal. 2 tabs by mouth twice per day     multivitamin capsule  Take 1 capsule by mouth daily.     nitroGLYCERIN 0.4 MG SL tablet  Commonly known as:  NITROSTAT  Place 1 tablet (0.4 mg total) under the tongue every 5 (five) minutes as needed. For chest pain     omeprazole 20 MG capsule  Commonly known as:  PRILOSEC  Take 20 mg by mouth daily.     pravastatin 80 MG tablet  Commonly known as:  PRAVACHOL  take 1 tablet by mouth once daily     predniSONE 10 MG tablet  Commonly known as:  DELTASONE  Take 6 tablets (60 mg total) by mouth daily with breakfast. Decrease by one tablet daily until gone  Start taking on:  10/20/2012     PROAIR HFA 108 (90 BASE) MCG/ACT inhaler  Generic drug:  albuterol  inhale 2 puffs by mouth four times a day if needed      tiotropium 18 MCG inhalation capsule  Commonly known as:  SPIRIVA HANDIHALER  Place 1 capsule (18 mcg total) into inhaler and inhale daily.     zolpidem 10 MG tablet  Commonly known as:  AMBIEN  Take 10 mg by mouth at bedtime as needed. For sleep         The results of significant diagnostics from this hospitalization (including imaging, microbiology, ancillary and laboratory) are listed below for reference.    Significant Diagnostic Studies: Dg Chest 2 View (if Patient Has Fever And/or Copd)  10/16/2012   *RADIOLOGY REPORT*  Clinical Data: Chest pain, shortness of breath, left arm weakness and numbness.  CHEST - 2 VIEW  Comparison: 01/17/2012  Findings: Stable appearance of postoperative changes in the mediastinum since prior study.  Normal heart size and pulmonary vascularity.  Emphysematous changes in the lungs.  Peribronchial thickening and interstitial changes consistent with chronic bronchitis.  No blunting of costophrenic angles.  No pneumothorax. Old left rib fractures.  Degenerative changes in the spine and shoulders.  Calcification of the aorta.  No significant change since previous study.  IMPRESSION: Emphysematous and chronic bronchitic changes.  No evidence of active pulmonary disease.   Original Report Authenticated By: Lucienne Capers, M.D.     Microbiology: No results found for this or any previous visit (from the past 240 hour(s)).   Labs: Basic Metabolic Panel:  Recent Labs Lab 10/16/12 2310 10/17/12 0630 10/17/12 1030 10/18/12 0440 10/19/12 0500  NA 138 139  --  134* 136  K 5.6* 5.5*  --  3.4* 3.9  CL 98 99  --  97 99  CO2 29 28  --  27 26  GLUCOSE 149* 186*  --  210* 189*  BUN 22 20  --  30* 34*  CREATININE 1.03 0.97  --  1.11 0.96  CALCIUM 9.7  10.1  --  9.0 8.9  MG  --   --  2.0  --   --    Liver Function Tests: No results found for this basename: AST, ALT, ALKPHOS, BILITOT, PROT, ALBUMIN,  in the last 168 hours No results found for this  basename: LIPASE, AMYLASE,  in the last 168 hours No results found for this basename: AMMONIA,  in the last 168 hours CBC:  Recent Labs Lab 10/16/12 2310 10/17/12 0630  WBC 15.4* 13.5*  HGB 13.8 13.4  HCT 39.9 39.7  MCV 94.1 93.9  PLT 231 217   Cardiac Enzymes:  Recent Labs Lab 10/17/12 0630 10/17/12 1030 10/17/12 1411  TROPONINI <0.30 <0.30 <0.30   BNP: No components found with this basename: POCBNP,  CBG:  Recent Labs Lab 10/18/12 0745 10/18/12 1144 10/18/12 1738 10/18/12 2011 10/19/12 0732  GLUCAP 187* 298* 210* 155* 204*    Time coordinating discharge:  Greater than 30 minutes  Signed:  Oliviah Agostini, DO [facility] Pager: [phone] 10/19/2012, 11:24 AM

## 2012-10-18 NOTE — Progress Notes (Signed)
[facility] PROGRESS NOTE  [name] [id number] DOB: 06-04-1940 DOA: 10/16/2012 PCP: Cathlean Cower, MD  Assessment/Plan: Acute on chronic respiratory failure  -Patient is option dependent on 2.5 L  -Secondary to COPD exacerbation  COPD exacerbation  -Decrease IV Solu-Medrol to 60 mg twice a day -continue azithromycin  -Pulmonary toilet  -Continue aerosolized albuterol and Atrovent  Continued tobacco abuse  -Tobacco cessation discussed  -Nicoderm patch Leukocytosis  -Likely due to steroids  Atypical chest pain  -cycle troponins--negative Multifocal atrial tachycardia  -Heart rate is improved with treatment of COPD  -Has converted back to sinus rhythm with treatment of his COPD and improvement of his electrolytes Hyperkalemia  -K improved with IV fluids and ayexalate  Diabetes mellitus type 2  -NovoLog sliding scale  -Discontinue metformin until discharge Family Communication: wife at beside  Disposition Plan: Home when medically stable   Antibiotics:  Azithromycin 10/17/2012>>>       Procedures/Studies: Dg Chest 2 View (if Patient Has Fever And/or Copd)  10/16/2012   *RADIOLOGY REPORT*  Clinical Data: Chest pain, shortness of breath, left arm weakness and numbness.  CHEST - 2 VIEW  Comparison: 01/17/2012  Findings: Stable appearance of postoperative changes in the mediastinum since prior study.  Normal heart size and pulmonary vascularity.  Emphysematous changes in the lungs.  Peribronchial thickening and interstitial changes consistent with chronic bronchitis.  No blunting of costophrenic angles.  No pneumothorax. Old left rib fractures.  Degenerative changes in the spine and shoulders.  Calcification of the aorta.  No significant change since previous study.  IMPRESSION: Emphysematous and chronic bronchitic changes.  No evidence of active pulmonary disease.   Original Report Authenticated By: Lucienne Capers, M.D.         Subjective:  Patient is  breathing better. He denies any fevers, chills, chest pain, shortness of breath at rest, nausea, vomiting, diarrhea, abdominal pain, dysuria.  Objective: Filed Vitals:   10/17/12 2142 10/18/12 0500 10/18/12 0900 10/18/12 1331  BP: 117/60 107/64  146/77  Pulse: 61 60  79  Temp: 98.5 F (36.9 C) 98.2 F (36.8 C)  98.2 F (36.8 C)  TempSrc: Oral   Oral  Resp: 18 20  18   Height:      Weight:      SpO2: 96% 91% 93% 93%    Intake/Output Summary (Last 24 hours) at 10/18/12 1440 Last data filed at 10/18/12 1300  Gross per 24 hour  Intake   1455 ml  Output    550 ml  Net    905 ml   Weight change:  Exam:   General:  Pt is alert, follows commands appropriately, not in acute distress  HEENT: No icterus, No thrush,  [place]/AT  Cardiovascular: RRR, S1/S2, no rubs, no gallops  Respiratory: minimal basilar wheeze. No  Rhonchi. Good air movement.  Abdomen: Soft/+BS, non tender, non distended, no guarding  Extremities: No edema, No lymphangitis, No petechiae, No rashes, no synovitis  Data Reviewed: Basic Metabolic Panel:  Recent Labs Lab 10/16/12 2310 10/17/12 0630 10/17/12 1030 10/18/12 0440  NA 138 139  --  134*  K 5.6* 5.5*  --  3.4*  CL 98 99  --  97  CO2 29 28  --  27  GLUCOSE 149* 186*  --  210*  BUN 22 20  --  30*  CREATININE 1.03 0.97  --  1.11  CALCIUM 9.7 10.1  --  9.0  MG  --   --  2.0  --  Liver Function Tests: No results found for this basename: AST, ALT, ALKPHOS, BILITOT, PROT, ALBUMIN,  in the last 168 hours No results found for this basename: LIPASE, AMYLASE,  in the last 168 hours No results found for this basename: AMMONIA,  in the last 168 hours CBC:  Recent Labs Lab 10/16/12 2310 10/17/12 0630  WBC 15.4* 13.5*  HGB 13.8 13.4  HCT 39.9 39.7  MCV 94.1 93.9  PLT 231 217   Cardiac Enzymes:  Recent Labs Lab 10/17/12 0630 10/17/12 1030 10/17/12 1411  TROPONINI <0.30 <0.30 <0.30   BNP: No components found with this basename: POCBNP,   CBG:  Recent Labs Lab 10/17/12 1117 10/17/12 1641 10/17/12 2031 10/18/12 0745 10/18/12 1144  GLUCAP 205* 262* 231* 187* 298*    No results found for this or any previous visit (from the past 240 hour(s)).   Scheduled Meds: . albuterol  2.5 mg Nebulization Q6H  . amLODipine  2.5 mg Oral Daily  . azithromycin  250 mg Oral Daily  . donepezil  5 mg Oral QHS  . DULoxetine  60 mg Oral Daily  . enoxaparin (LOVENOX) injection  40 mg Subcutaneous Daily  . insulin aspart  0-15 Units Subcutaneous TID WC  . ipratropium  0.5 mg Nebulization Q6H  . methylPREDNISolone (SOLU-MEDROL) injection  60 mg Intravenous Q12H  . nicotine  21 mg Transdermal Daily  . OxyCODONE  40 mg Oral Q12H  . potassium chloride  10 mEq Oral Once  . sodium chloride  3 mL Intravenous Q12H  . sodium chloride  3 mL Intravenous Q12H   Continuous Infusions:    Ellieana Dolecki, DO  [facility] Pager [phone]  If 7PM-7AM, please contact night-coverage [email or web address] Password [email or web address] 10/18/2012, 2:40 PM   LOS: 2 days

## 2012-10-19 DIAGNOSIS — E875 Hyperkalemia: Secondary | ICD-10-CM | POA: Diagnosis not present

## 2012-10-19 DIAGNOSIS — I2510 Atherosclerotic heart disease of native coronary artery without angina pectoris: Secondary | ICD-10-CM | POA: Diagnosis not present

## 2012-10-19 DIAGNOSIS — J441 Chronic obstructive pulmonary disease with (acute) exacerbation: Secondary | ICD-10-CM | POA: Diagnosis not present

## 2012-10-19 DIAGNOSIS — J9610 Chronic respiratory failure, unspecified whether with hypoxia or hypercapnia: Secondary | ICD-10-CM | POA: Diagnosis not present

## 2012-10-19 LAB — BASIC METABOLIC PANEL
BUN: 34 mg/dL — ABNORMAL HIGH (ref 6–23)
Calcium: 8.9 mg/dL (ref 8.4–10.5)
Creatinine, Ser: 0.96 mg/dL (ref 0.50–1.35)
GFR calc Af Amer: >90 mL/min (ref >90)
GFR calc non Af Amer: 83 mL/min — ABNORMAL LOW (ref >90)
Potassium: 3.9 mEq/L (ref 3.5–5.1)

## 2012-10-19 LAB — GLUCOSE, CAPILLARY: Glucose-Capillary: 204 mg/dL — ABNORMAL HIGH (ref 70–99)

## 2012-10-19 MED ORDER — PREDNISONE 50 MG PO TABS
60.0000 mg | ORAL_TABLET | Freq: Every day | ORAL | Status: DC
  Filled 2012-10-19: qty 1

## 2012-10-19 MED ORDER — AZITHROMYCIN 250 MG PO TABS: 250.0000 mg | ORAL_TABLET | Freq: Every day | ORAL | Status: DC

## 2012-10-19 MED ORDER — PREDNISONE 10 MG PO TABS: 60.0000 mg | ORAL_TABLET | Freq: Every day | ORAL | Status: DC

## 2012-10-19 MED ORDER — TIOTROPIUM BROMIDE MONOHYDRATE 18 MCG IN CAPS: 18.0000 ug | ORAL_CAPSULE | Freq: Every day | RESPIRATORY_TRACT | Status: DC

## 2012-10-19 NOTE — Progress Notes (Signed)
Patient alert and oriented x4.  Pt and patient's wife given discharge instructions.  Denies any questions or concerns regarding instructions.  IV access removed, cannula intact, and no redness, drainage, swelling or bruising.  Telemetry removed and placed in designated area.  Clarified oxycontin instructions with patient and informed to follow up with pcp.  Pt discharged home per wheelchair with volunteer.

## 2012-10-28 ENCOUNTER — Other Ambulatory Visit: Payer: Self-pay | Admitting: Internal Medicine

## 2012-10-28 ENCOUNTER — Other Ambulatory Visit: Payer: Self-pay | Admitting: Physician Assistant

## 2012-10-28 NOTE — Telephone Encounter (Signed)
Done hardcopy to robin  

## 2012-10-28 NOTE — Telephone Encounter (Signed)
Faxed hardcopy to [place]

## 2012-11-11 ENCOUNTER — Other Ambulatory Visit: Payer: Self-pay

## 2012-11-20 DIAGNOSIS — H81399 Other peripheral vertigo, unspecified ear: Secondary | ICD-10-CM | POA: Diagnosis not present

## 2012-11-20 DIAGNOSIS — G541 Lumbosacral plexus disorders: Secondary | ICD-10-CM | POA: Diagnosis not present

## 2012-11-20 DIAGNOSIS — M545 Low back pain, unspecified: Secondary | ICD-10-CM | POA: Diagnosis not present

## 2012-11-20 DIAGNOSIS — H8190 Unspecified disorder of vestibular function, unspecified ear: Secondary | ICD-10-CM | POA: Diagnosis not present

## 2012-11-20 DIAGNOSIS — Z79899 Other long term (current) drug therapy: Secondary | ICD-10-CM | POA: Diagnosis not present

## 2012-11-29 ENCOUNTER — Other Ambulatory Visit: Payer: Self-pay | Admitting: Internal Medicine

## 2012-11-30 NOTE — Telephone Encounter (Signed)
Faxed hardcopy to [employer]

## 2012-11-30 NOTE — Telephone Encounter (Signed)
Done hardcopy to robin  

## 2012-12-03 ENCOUNTER — Ambulatory Visit (INDEPENDENT_AMBULATORY_CARE_PROVIDER_SITE_OTHER)
Admission: RE | Admit: 2012-12-03 | Discharge: 2012-12-03 | Disposition: A | Discharge status: Home / Self Care | Payer: Medicare Other | Source: Ambulatory Visit | Attending: Internal Medicine | Admitting: Internal Medicine

## 2012-12-03 ENCOUNTER — Encounter: Payer: Self-pay | Admitting: Internal Medicine

## 2012-12-03 ENCOUNTER — Ambulatory Visit (INDEPENDENT_AMBULATORY_CARE_PROVIDER_SITE_OTHER): Payer: Medicare Other | Admitting: Internal Medicine

## 2012-12-03 VITALS — BP 140/74 | HR 88 | Temp 98.5°F | Wt 168.0 lb

## 2012-12-03 DIAGNOSIS — J441 Chronic obstructive pulmonary disease with (acute) exacerbation: Secondary | ICD-10-CM

## 2012-12-03 DIAGNOSIS — J209 Acute bronchitis, unspecified: Secondary | ICD-10-CM | POA: Diagnosis not present

## 2012-12-03 DIAGNOSIS — F17200 Nicotine dependence, unspecified, uncomplicated: Secondary | ICD-10-CM | POA: Diagnosis not present

## 2012-12-03 DIAGNOSIS — F411 Generalized anxiety disorder: Secondary | ICD-10-CM

## 2012-12-03 DIAGNOSIS — R0989 Other specified symptoms and signs involving the circulatory and respiratory systems: Secondary | ICD-10-CM | POA: Diagnosis not present

## 2012-12-03 MED ORDER — METHYLPREDNISOLONE ACETATE 80 MG/ML IJ SUSP
80.0000 mg | Freq: Once | INTRAMUSCULAR | Status: AC
  Administered 2012-12-03: 80 mg via INTRAMUSCULAR

## 2012-12-03 MED ORDER — PREDNISONE 10 MG PO TABS: ORAL_TABLET | ORAL | Status: DC

## 2012-12-03 MED ORDER — LEVOFLOXACIN 250 MG PO TABS: 250.0000 mg | ORAL_TABLET | Freq: Every day | ORAL | Status: DC

## 2012-12-03 MED ORDER — ALPRAZOLAM 0.25 MG PO TABS: ORAL_TABLET | ORAL | Status: DC

## 2012-12-03 MED ORDER — BENZONATATE 100 MG PO CAPS: ORAL_CAPSULE | ORAL | Status: DC

## 2012-12-03 NOTE — Assessment & Plan Note (Signed)
For change xanax back to tid prn,  to f/u any worsening symptoms or concerns

## 2012-12-03 NOTE — Patient Instructions (Signed)
You had the steroid shot today Please take all new medication as prescribed  - the antibiotic, cough pills, and prednisone Please continue all other medications as before, and refills have been done if requested. - the xanax as prescribed Please go to the XRAY Department in the Basement (go straight as you get off the elevator) for the x-ray testing You will be contacted by phone if any changes need to be made immediately.  Otherwise, you will receive a letter about your results with an explanation, but please check with MyChart first.  Please remember to sign up for My Chart if you have not done so, as this will be important to you in the future with finding out test results, communicating by private email, and scheduling acute appointments online when needed.  Please stop smoking  Please keep your appointments with your specialists as you have planned - Dr Angelena Form next yr

## 2012-12-03 NOTE — Progress Notes (Signed)
Subjective:    Patient ID: Tyler Oh., male    DOB: 04-Apr-1941, 72 y.o.   MRN: [id number]  HPI  Here with acute onset mild to mod 2-3 days ST, HA, general weakness and malaise, with prod cough greenish sputum, but Pt denies chest pain, increased sob or doe, wheezing, orthopnea, PND, increased LE swelling, palpitations, dizziness or syncope, except fonset wheezing x 2 days. Past Medical History  Diagnosis Date  . COPD (chronic obstructive pulmonary disease)   . Allergy   . Diabetes mellitus   . Emphysema of lung   . GERD (gastroesophageal reflux disease)   . Hypertension   . Hyperlipidemia   . CAD (coronary artery disease)     a. s/p multiple PCIs to RCA and eventual CABG in 1999;  b.LHC 11/2003: CFX 95%, RCA occluded, SVG-RCA occluded, SVG-OM patent, native LAD patent. EF was 55%.  Patient had left to right collaterals and medical therapy was recommended    . Anxiety   . PVD (peripheral vascular disease)   . OA (osteoarthritis)   . Asthma   . Iron deficiency anemia   . Memory loss   . Insomnia   . DDD (degenerative disc disease), lumbar   . OSA (obstructive sleep apnea)   . Depression   . Benign prostatic hypertrophy   . Lumbar back pain     chronic  . Spinal stenosis   . Herniated disc   . History of colonic polyps   . Diverticulitis, colon   . Esophageal stricture   . Hx of colonoscopy   . Hiatal hernia   . HOH (hard of hearing)    Past Surgical History  Procedure Laterality Date  . Heart bypass    . Bilateral knee replacements    . Hemorrhoid surgery    . Carpal tunnel release    . Shoulder arthroscopy  right  . Coronary artery bypass graft  09/1997    double bypss  . Coronary angioplasty with stent placement  2/99  . Strabismus surgery  08/29/2011    Procedure: REPAIR STRABISMUS;  Surgeon: Derry Skill, MD;  Location: [facility];  Service: Ophthalmology;  Laterality: Left;    reports that he has been smoking Cigarettes.  He has a 40 pack-year smoking  history. He uses smokeless tobacco. He reports that he does not drink alcohol or use illicit drugs. family history includes Allergies in an other family member; Diabetes in an other family member; Emphysema in his father; Heart disease in an other family member; Hyperlipidemia in an other family member; Hypertension in an other family member; Kidney cancer in his daughter; Stroke in his father. There is no history of Colon cancer. Allergies  Allergen Reactions  . Celecoxib Nausea And Vomiting    REACTION: GI upset  . Chicken Protein Other (See Comments)    Will not eat chicken  . Fish-Derived Products Other (See Comments)    Will not eat seafood of any kind  . Morphine Itching    REACTION: itching  . Nalbuphine Other (See Comments)    REACTION: contraindication with oxycontin.  . Prednisone Other (See Comments)    REACTION: "yeast infections"  . Venlafaxine Other (See Comments)    REACTION: GI upset   Current Outpatient Prescriptions on File Prior to Visit  Medication Sig Dispense Refill  . AMBULATORY NON FORMULARY MEDICATION 2 Liters home oxygen continuous      . amLODipine (NORVASC) 2.5 MG tablet take 1 tablet by mouth once daily  30  tablet  11  . Aspirin-Salicylamide-Caffeine (BC HEADACHE POWDER PO) Take 1 Package by mouth 4 (four) times daily as needed (headache or pain).      . clonazePAM (KLONOPIN) 0.5 MG tablet take 1 tablet by mouth three times a day if needed for anxiety  90 tablet  2  . docusate sodium (COLACE) 100 MG capsule Take 200 mg by mouth daily.       Marland Kitchen donepezil (ARICEPT) 5 MG tablet take 1 take tablet by mouth at bedtime  30 tablet  6  . DULoxetine (CYMBALTA) 60 MG capsule Take 1 capsule (60 mg total) by mouth daily.  180 capsule  3  . ferrous sulfate 325 (65 FE) MG EC tablet Take 325 mg by mouth daily.       Marland Kitchen GLIPIZIDE XL 2.5 MG 24 hr tablet take 1 tablet by mouth once daily ONLY FOR BLOOD SUGAR MORE THAN 180  30 each  11  . HYDROcodone-acetaminophen (LORCET)  10-650 MG per tablet Take 1 tablet by mouth every 6 (six) hours as needed (for breakthrough pain). For pain      . metFORMIN (GLUCOPHAGE) 500 MG tablet Take 1,000 mg by mouth 2 (two) times daily with a meal. 2 tabs by mouth twice per day      . Multiple Vitamin (MULTIVITAMIN) capsule Take 1 capsule by mouth daily.       . nitroGLYCERIN (NITROSTAT) 0.4 MG SL tablet Place 1 tablet (0.4 mg total) under the tongue every 5 (five) minutes as needed. For chest pain  25 tablet  5  . omeprazole (PRILOSEC) 20 MG capsule Take 20 mg by mouth daily.       . pravastatin (PRAVACHOL) 80 MG tablet take 1 tablet by mouth once daily  90 tablet  3  . PROAIR HFA 108 (90 BASE) MCG/ACT inhaler inhale 2 puffs by mouth four times a day if needed  8.5 g  2  . tiotropium (SPIRIVA HANDIHALER) 18 MCG inhalation capsule Place 1 capsule (18 mcg total) into inhaler and inhale daily.  30 capsule  12  . zolpidem (AMBIEN) 10 MG tablet Take 10 mg by mouth at bedtime as needed. For sleep       No current facility-administered medications on file prior to visit.   Review of Systems  Constitutional: Negative for unexpected weight change, or unusual diaphoresis  HENT: Negative for tinnitus.   Eyes: Negative for photophobia and visual disturbance.  Respiratory: Negative for choking and stridor.   Gastrointestinal: Negative for vomiting and blood in stool.  Genitourinary: Negative for hematuria and decreased urine volume.  Musculoskeletal: Negative for acute joint swelling Skin: Negative for color change and wound.  Neurological: Negative for tremors and numbness other than noted  Psychiatric/Behavioral: Negative for decreased concentration or  hyperactivity.       Objective:   Physical Exam BP 140/74  Pulse 88  Temp(Src) 98.5 F (36.9 C) (Oral)  Wt 168 lb (76.204 kg)  BMI 25.55 kg/m2  SpO2 91% VS noted, mild ill Constitutional: Pt appears well-developed and well-nourished.  HENT: Head: NCAT.  Right Ear: External ear  normal.  Left Ear: External ear normal.  Bilat tm's with mild erythema.  Max sinus areas non tender.  Pharynx with mild erythema, no exudate Eyes: Conjunctivae and EOM are normal. Pupils are equal, round, and reactive to light.  Neck: Normal range of motion. Neck supple.  Cardiovascular: Normal rate and regular rhythm.   Pulmonary/Chest: Effort normal and breath sounds decreased with bilat wheeze.  Abd:  Soft, NT, non-distended, + BS Neurological: Pt is alert. Not confused  Skin: Skin is warm. No erythema.  Psychiatric: Pt behavior is normal. Thought content normal. 1+ nervous     Assessment & Plan:

## 2012-12-05 NOTE — Assessment & Plan Note (Signed)
Urged to quit 

## 2012-12-05 NOTE — Assessment & Plan Note (Signed)
Mild to mod, for antibx course,  to f/u any worsening symptoms or concerns 

## 2012-12-05 NOTE — Assessment & Plan Note (Signed)
Mild to mod, for depomedrol IM, predpac asd, to f/u any worsening symptoms or concerns 

## 2012-12-15 ENCOUNTER — Other Ambulatory Visit: Payer: Self-pay | Admitting: Internal Medicine

## 2012-12-15 NOTE — Telephone Encounter (Signed)
Pt already with recent rx xanax

## 2012-12-18 ENCOUNTER — Telehealth: Payer: Self-pay

## 2012-12-18 NOTE — Telephone Encounter (Signed)
Pharmacy informed of MD instructions

## 2012-12-18 NOTE — Telephone Encounter (Signed)
Refill request from pharmacy for Lorazepam 1 mg.

## 2012-12-18 NOTE — Telephone Encounter (Signed)
Already has xanax to fill sept 14 as per recent request

## 2012-12-21 ENCOUNTER — Other Ambulatory Visit: Payer: Self-pay | Admitting: Internal Medicine

## 2013-01-04 ENCOUNTER — Other Ambulatory Visit: Payer: Self-pay | Admitting: Internal Medicine

## 2013-01-27 DIAGNOSIS — Z23 Encounter for immunization: Secondary | ICD-10-CM | POA: Diagnosis not present

## 2013-02-05 ENCOUNTER — Other Ambulatory Visit: Payer: Self-pay | Admitting: Internal Medicine

## 2013-02-11 ENCOUNTER — Other Ambulatory Visit: Payer: Self-pay

## 2013-02-16 DIAGNOSIS — M545 Low back pain, unspecified: Secondary | ICD-10-CM | POA: Diagnosis not present

## 2013-02-16 DIAGNOSIS — H8190 Unspecified disorder of vestibular function, unspecified ear: Secondary | ICD-10-CM | POA: Diagnosis not present

## 2013-02-16 DIAGNOSIS — G541 Lumbosacral plexus disorders: Secondary | ICD-10-CM | POA: Diagnosis not present

## 2013-02-16 DIAGNOSIS — H81399 Other peripheral vertigo, unspecified ear: Secondary | ICD-10-CM | POA: Diagnosis not present

## 2013-02-18 ENCOUNTER — Ambulatory Visit (INDEPENDENT_AMBULATORY_CARE_PROVIDER_SITE_OTHER): Payer: Medicare Other | Admitting: Internal Medicine

## 2013-02-18 ENCOUNTER — Ambulatory Visit (INDEPENDENT_AMBULATORY_CARE_PROVIDER_SITE_OTHER)
Admission: RE | Admit: 2013-02-18 | Discharge: 2013-02-18 | Disposition: A | Discharge status: Home / Self Care | Payer: Medicare Other | Source: Ambulatory Visit | Attending: Internal Medicine | Admitting: Internal Medicine

## 2013-02-18 ENCOUNTER — Encounter: Payer: Self-pay | Admitting: Internal Medicine

## 2013-02-18 VITALS — BP 140/78 | HR 65 | Temp 97.6°F | Ht 70.0 in | Wt 166.0 lb

## 2013-02-18 DIAGNOSIS — E119 Type 2 diabetes mellitus without complications: Secondary | ICD-10-CM

## 2013-02-18 DIAGNOSIS — I10 Essential (primary) hypertension: Secondary | ICD-10-CM

## 2013-02-18 DIAGNOSIS — J441 Chronic obstructive pulmonary disease with (acute) exacerbation: Secondary | ICD-10-CM | POA: Diagnosis not present

## 2013-02-18 DIAGNOSIS — J209 Acute bronchitis, unspecified: Secondary | ICD-10-CM | POA: Diagnosis not present

## 2013-02-18 DIAGNOSIS — Z23 Encounter for immunization: Secondary | ICD-10-CM

## 2013-02-18 DIAGNOSIS — J438 Other emphysema: Secondary | ICD-10-CM | POA: Diagnosis not present

## 2013-02-18 MED ORDER — PANTOPRAZOLE SODIUM 40 MG PO TBEC: 40.0000 mg | DELAYED_RELEASE_TABLET | Freq: Every day | ORAL | Status: DC

## 2013-02-18 MED ORDER — CEFTRIAXONE SODIUM 1 G IJ SOLR
1.0000 g | INTRAMUSCULAR | Status: DC
  Administered 2013-02-18: 1 g via INTRAMUSCULAR

## 2013-02-18 MED ORDER — LEVOFLOXACIN 250 MG PO TABS: 250.0000 mg | ORAL_TABLET | Freq: Every day | ORAL | Status: DC

## 2013-02-18 MED ORDER — PREDNISONE 10 MG PO TABS: ORAL_TABLET | ORAL | Status: DC

## 2013-02-18 MED ORDER — METHYLPREDNISOLONE ACETATE 80 MG/ML IJ SUSP
120.0000 mg | Freq: Once | INTRAMUSCULAR | Status: AC
  Administered 2013-02-18: 120 mg via INTRAMUSCULAR

## 2013-02-18 NOTE — Progress Notes (Addendum)
Subjective:    Patient ID: Tyler Oh., male    DOB: 11-13-1940, 72 y.o.   MRN: [id number]  HPI   Here with acute onset mild to mod 2-3 days ST, HA, general weakness and malaise, with prod cough greenish sputum, but Pt denies chest pain, increased sob or doe, wheezing, orthopnea, PND, increased LE swelling, palpitations, dizziness or syncope, except for onset wheezing and mild sob last night.   Pt denies polydipsia, polyuria, Pt states overall good compliance with meds, trying to follow lower cholesterol, diabetic diet, wt overall stable. Past Medical History  Diagnosis Date  . COPD (chronic obstructive pulmonary disease)   . Allergy   . Diabetes mellitus   . Emphysema of lung   . GERD (gastroesophageal reflux disease)   . Hypertension   . Hyperlipidemia   . CAD (coronary artery disease)     a. s/p multiple PCIs to RCA and eventual CABG in 1999;  b.LHC 11/2003: CFX 95%, RCA occluded, SVG-RCA occluded, SVG-OM patent, native LAD patent. EF was 55%.  Patient had left to right collaterals and medical therapy was recommended    . Anxiety   . PVD (peripheral vascular disease)   . OA (osteoarthritis)   . Asthma   . Iron deficiency anemia   . Memory loss   . Insomnia   . DDD (degenerative disc disease), lumbar   . OSA (obstructive sleep apnea)   . Depression   . Benign prostatic hypertrophy   . Lumbar back pain     chronic  . Spinal stenosis   . Herniated disc   . History of colonic polyps   . Diverticulitis, colon   . Esophageal stricture   . Hx of colonoscopy   . Hiatal hernia   . HOH (hard of hearing)    Past Surgical History  Procedure Laterality Date  . Heart bypass    . Bilateral knee replacements    . Hemorrhoid surgery    . Carpal tunnel release    . Shoulder arthroscopy  right  . Coronary artery bypass graft  09/1997    double bypss  . Coronary angioplasty with stent placement  2/99  . Strabismus surgery  08/29/2011    Procedure: REPAIR STRABISMUS;  Surgeon:  Derry Skill, MD;  Location: [facility];  Service: Ophthalmology;  Laterality: Left;    reports that he has been smoking Cigarettes.  He has a 40 pack-year smoking history. He uses smokeless tobacco. He reports that he does not drink alcohol or use illicit drugs. family history includes Allergies in an other family member; Diabetes in an other family member; Emphysema in his father; Heart disease in an other family member; Hyperlipidemia in an other family member; Hypertension in an other family member; Kidney cancer in his daughter; Stroke in his father. There is no history of Colon cancer. Allergies  Allergen Reactions  . Celecoxib Nausea And Vomiting    REACTION: GI upset  . Chicken Protein Other (See Comments)    Will not eat chicken  . Fish-Derived Products Other (See Comments)    Will not eat seafood of any kind  . Morphine Itching    REACTION: itching  . Nalbuphine Other (See Comments)    REACTION: contraindication with oxycontin.  . Prednisone Other (See Comments)    REACTION: "yeast infections"  . Venlafaxine Other (See Comments)    REACTION: GI upset   Current Outpatient Prescriptions on File Prior to Visit  Medication Sig Dispense Refill  . ALPRAZolam Duanne Moron)  0.25 MG tablet take 1 tablet by mouth tid as needed - to fill sept 14, 2014  90 tablet  2  . AMBULATORY NON FORMULARY MEDICATION 2 Liters home oxygen continuous      . amLODipine (NORVASC) 2.5 MG tablet take 1 tablet by mouth once daily  30 tablet  11  . Aspirin-Salicylamide-Caffeine (BC HEADACHE POWDER PO) Take 1 Package by mouth 4 (four) times daily as needed (headache or pain).      . benzonatate (TESSALON PERLES) 100 MG capsule 1-2 tabs by mouth every 6 hrs as needed for coughy  60 capsule  1  . docusate sodium (COLACE) 100 MG capsule Take 200 mg by mouth daily.       Marland Kitchen donepezil (ARICEPT) 5 MG tablet take 1 tablet by mouth at bedtime  30 tablet  6  . DULoxetine (CYMBALTA) 60 MG capsule take 1 capsule by mouth once  daily  90 capsule  0  . ferrous sulfate 325 (65 FE) MG EC tablet Take 325 mg by mouth daily.       . fluticasone (FLONASE) 50 MCG/ACT nasal spray instill 2 sprays into each nostril once daily  16 g  11  . GLIPIZIDE XL 2.5 MG 24 hr tablet take 1 tablet by mouth once daily ONLY FOR BLOOD SUGAR MORE THAN 180  30 each  11  . HYDROcodone-acetaminophen (LORCET) 10-650 MG per tablet Take 1 tablet by mouth every 6 (six) hours as needed (for breakthrough pain). For pain      . metFORMIN (GLUCOPHAGE) 500 MG tablet Take 1,000 mg by mouth 2 (two) times daily with a meal. 2 tabs by mouth twice per day      . Multiple Vitamin (MULTIVITAMIN) capsule Take 1 capsule by mouth daily.       . nitroGLYCERIN (NITROSTAT) 0.4 MG SL tablet Place 1 tablet (0.4 mg total) under the tongue every 5 (five) minutes as needed. For chest pain  25 tablet  5  . pravastatin (PRAVACHOL) 80 MG tablet take 1 tablet by mouth once daily  90 tablet  3  . PROAIR HFA 108 (90 BASE) MCG/ACT inhaler inhale 2 puffs by mouth four times a day if needed  8.5 g  2  . tiotropium (SPIRIVA HANDIHALER) 18 MCG inhalation capsule Place 1 capsule (18 mcg total) into inhaler and inhale daily.  30 capsule  12  . zolpidem (AMBIEN) 10 MG tablet Take 10 mg by mouth at bedtime as needed. For sleep       No current facility-administered medications on file prior to visit.   Review of Systems  Constitutional: Negative for unexpected weight change, or unusual diaphoresis  HENT: Negative for tinnitus.   Eyes: Negative for photophobia and visual disturbance.  Respiratory: Negative for choking and stridor.   Gastrointestinal: Negative for vomiting and blood in stool.  Genitourinary: Negative for hematuria and decreased urine volume.  Musculoskeletal: Negative for acute joint swelling Skin: Negative for color change and wound.  Neurological: Negative for tremors and numbness other than noted  Psychiatric/Behavioral: Negative for decreased concentration or   hyperactivity.       Objective:   Physical Exam BP 140/78  Pulse 65  Temp(Src) 97.6 F (36.4 C) (Oral)  Ht 5\' 10"  (1.778 m)  Wt 166 lb (75.297 kg)  BMI 23.82 kg/m2  SpO2 91% VS noted, mild ill Constitutional: Pt appears well-developed and well-nourished.  HENT: Head: NCAT.  Right Ear: External ear normal.  Left Ear: External ear normal.  Bilat  tm's with mild erythema.  Max sinus areas mild tender.  Pharynx with mild erythema, no exudate Eyes: Conjunctivae and EOM are normal. Pupils are equal, round, and reactive to light.  Neck: Normal range of motion. Neck supple.  Cardiovascular: Normal rate and regular rhythm.   Pulmonary/Chest: Effort normal and breath sounds decresaed, bilat wheezes.  Neurological: Pt is alert. Not confused  Skin: Skin is warm. No erythema.  Psychiatric: Pt behavior is normal. Thought content normal.     Assessment & Plan:  Quality Measures addressed:   Colorectal Cancer screening: pt declines all at this time, including FOBT, flex sig, colonoscopy Pneumonia Vaccine: pt declines, may self-refer to local pharmacy

## 2013-02-18 NOTE — Patient Instructions (Addendum)
You had the new Prevnar Pneumonia shot today You had the steroid shot today, and the antibiotic shot (rocephin) Please take all new medication as prescribed - the antibiotic, and prednisone Please continue all other medications as before, including the tessalon perle for cough  Please go to the XRAY Department in the Basement (go straight as you get off the elevator) for the x-ray testing  You will be contacted by phone if any changes need to be made immediately.  Otherwise, you will receive a letter about your results with an explanation, but please check with MyChart first.  No other lab work needed today  Please return in 3 months, or sooner if needed

## 2013-02-18 NOTE — Progress Notes (Signed)
Pre-visit discussion using our clinic review tool. No additional management support is needed unless otherwise documented below in the visit note.  

## 2013-02-19 ENCOUNTER — Other Ambulatory Visit: Payer: Self-pay | Admitting: Internal Medicine

## 2013-02-21 NOTE — Assessment & Plan Note (Signed)
For rocephin, depomedrol IM, predpack asd, cough pill,  to f/u any worsening symptoms or concerns

## 2013-02-21 NOTE — Assessment & Plan Note (Signed)
stable overall by history and exam, recent data reviewed with pt, and pt to continue medical treatment as before,  to f/u any worsening symptoms or concerns Lab Results  Component Value Date   HGBA1C 5.8* 10/17/2012   To call for onset polys

## 2013-02-21 NOTE — Assessment & Plan Note (Signed)
stable overall by history and exam, recent data reviewed with pt, and pt to continue medical treatment as before,  to f/u any worsening symptoms or concerns BP Readings from Last 3 Encounters:  02/18/13 140/78  12/03/12 140/74  10/19/12 147/105

## 2013-02-21 NOTE — Assessment & Plan Note (Signed)
Mild to mod, for antibx course,  to f/u any worsening symptoms or concerns 

## 2013-03-10 DIAGNOSIS — M545 Low back pain, unspecified: Secondary | ICD-10-CM | POA: Diagnosis not present

## 2013-03-10 DIAGNOSIS — M533 Sacrococcygeal disorders, not elsewhere classified: Secondary | ICD-10-CM | POA: Diagnosis not present

## 2013-03-10 DIAGNOSIS — M5430 Sciatica, unspecified side: Secondary | ICD-10-CM | POA: Diagnosis not present

## 2013-03-10 DIAGNOSIS — G608 Other hereditary and idiopathic neuropathies: Secondary | ICD-10-CM | POA: Diagnosis not present

## 2013-03-28 ENCOUNTER — Other Ambulatory Visit: Payer: Self-pay | Admitting: Internal Medicine

## 2013-03-30 ENCOUNTER — Other Ambulatory Visit: Payer: Self-pay | Admitting: Internal Medicine

## 2013-03-30 NOTE — Telephone Encounter (Signed)
Done hardcopy to robin  

## 2013-03-30 NOTE — Telephone Encounter (Signed)
Faxed hardcopy to [facility]

## 2013-04-09 DIAGNOSIS — M25559 Pain in unspecified hip: Secondary | ICD-10-CM | POA: Diagnosis not present

## 2013-04-09 DIAGNOSIS — M533 Sacrococcygeal disorders, not elsewhere classified: Secondary | ICD-10-CM | POA: Diagnosis not present

## 2013-04-09 DIAGNOSIS — M545 Low back pain, unspecified: Secondary | ICD-10-CM | POA: Diagnosis not present

## 2013-04-09 DIAGNOSIS — G608 Other hereditary and idiopathic neuropathies: Secondary | ICD-10-CM | POA: Diagnosis not present

## 2013-04-09 DIAGNOSIS — M25519 Pain in unspecified shoulder: Secondary | ICD-10-CM | POA: Diagnosis not present

## 2013-04-09 DIAGNOSIS — M5430 Sciatica, unspecified side: Secondary | ICD-10-CM | POA: Diagnosis not present

## 2013-04-09 DIAGNOSIS — Z79899 Other long term (current) drug therapy: Secondary | ICD-10-CM | POA: Diagnosis not present

## 2013-04-09 DIAGNOSIS — M25569 Pain in unspecified knee: Secondary | ICD-10-CM | POA: Diagnosis not present

## 2013-04-20 ENCOUNTER — Other Ambulatory Visit: Payer: Self-pay | Admitting: Internal Medicine

## 2013-04-27 DIAGNOSIS — F4001 Agoraphobia with panic disorder: Secondary | ICD-10-CM | POA: Diagnosis not present

## 2013-05-05 DIAGNOSIS — M545 Low back pain, unspecified: Secondary | ICD-10-CM | POA: Diagnosis not present

## 2013-05-05 DIAGNOSIS — M25519 Pain in unspecified shoulder: Secondary | ICD-10-CM | POA: Diagnosis not present

## 2013-05-05 DIAGNOSIS — M5430 Sciatica, unspecified side: Secondary | ICD-10-CM | POA: Diagnosis not present

## 2013-05-05 DIAGNOSIS — M25569 Pain in unspecified knee: Secondary | ICD-10-CM | POA: Diagnosis not present

## 2013-05-18 DIAGNOSIS — M545 Low back pain, unspecified: Secondary | ICD-10-CM | POA: Diagnosis not present

## 2013-05-18 DIAGNOSIS — M5430 Sciatica, unspecified side: Secondary | ICD-10-CM | POA: Diagnosis not present

## 2013-05-18 DIAGNOSIS — M533 Sacrococcygeal disorders, not elsewhere classified: Secondary | ICD-10-CM | POA: Diagnosis not present

## 2013-05-18 DIAGNOSIS — M25569 Pain in unspecified knee: Secondary | ICD-10-CM | POA: Diagnosis not present

## 2013-05-18 DIAGNOSIS — M25559 Pain in unspecified hip: Secondary | ICD-10-CM | POA: Diagnosis not present

## 2013-05-18 DIAGNOSIS — M25519 Pain in unspecified shoulder: Secondary | ICD-10-CM | POA: Diagnosis not present

## 2013-05-20 DIAGNOSIS — F0390 Unspecified dementia without behavioral disturbance: Secondary | ICD-10-CM | POA: Diagnosis not present

## 2013-05-20 DIAGNOSIS — F411 Generalized anxiety disorder: Secondary | ICD-10-CM | POA: Diagnosis not present

## 2013-05-20 DIAGNOSIS — F331 Major depressive disorder, recurrent, moderate: Secondary | ICD-10-CM | POA: Diagnosis not present

## 2013-05-20 DIAGNOSIS — F0393 Unspecified dementia, unspecified severity, with mood disturbance: Secondary | ICD-10-CM | POA: Diagnosis not present

## 2013-06-15 DIAGNOSIS — M545 Low back pain, unspecified: Secondary | ICD-10-CM | POA: Diagnosis not present

## 2013-06-15 DIAGNOSIS — M5030 Other cervical disc degeneration, unspecified cervical region: Secondary | ICD-10-CM | POA: Diagnosis not present

## 2013-06-15 DIAGNOSIS — M5137 Other intervertebral disc degeneration, lumbosacral region: Secondary | ICD-10-CM | POA: Diagnosis not present

## 2013-06-15 DIAGNOSIS — Z79899 Other long term (current) drug therapy: Secondary | ICD-10-CM | POA: Diagnosis not present

## 2013-06-15 DIAGNOSIS — M533 Sacrococcygeal disorders, not elsewhere classified: Secondary | ICD-10-CM | POA: Diagnosis not present

## 2013-06-17 DIAGNOSIS — F0393 Unspecified dementia, unspecified severity, with mood disturbance: Secondary | ICD-10-CM | POA: Diagnosis not present

## 2013-06-17 DIAGNOSIS — F0390 Unspecified dementia without behavioral disturbance: Secondary | ICD-10-CM | POA: Diagnosis not present

## 2013-06-17 DIAGNOSIS — F411 Generalized anxiety disorder: Secondary | ICD-10-CM | POA: Diagnosis not present

## 2013-06-17 DIAGNOSIS — F331 Major depressive disorder, recurrent, moderate: Secondary | ICD-10-CM | POA: Diagnosis not present

## 2013-06-29 DIAGNOSIS — M5137 Other intervertebral disc degeneration, lumbosacral region: Secondary | ICD-10-CM | POA: Diagnosis not present

## 2013-06-29 DIAGNOSIS — M545 Low back pain, unspecified: Secondary | ICD-10-CM | POA: Diagnosis not present

## 2013-06-29 DIAGNOSIS — M533 Sacrococcygeal disorders, not elsewhere classified: Secondary | ICD-10-CM | POA: Diagnosis not present

## 2013-07-06 ENCOUNTER — Encounter: Payer: Self-pay | Admitting: Emergency Medicine

## 2013-07-06 ENCOUNTER — Emergency Department (INDEPENDENT_AMBULATORY_CARE_PROVIDER_SITE_OTHER): Payer: Medicare Other

## 2013-07-06 ENCOUNTER — Emergency Department (INDEPENDENT_AMBULATORY_CARE_PROVIDER_SITE_OTHER)
Admission: EM | Admit: 2013-07-06 | Discharge: 2013-07-06 | Disposition: A | Discharge status: Home / Self Care | Payer: Medicare Other | Source: Home / Self Care | Attending: Emergency Medicine | Admitting: Emergency Medicine

## 2013-07-06 ENCOUNTER — Telehealth: Payer: Self-pay | Admitting: Internal Medicine

## 2013-07-06 ENCOUNTER — Other Ambulatory Visit: Payer: Self-pay | Admitting: Internal Medicine

## 2013-07-06 DIAGNOSIS — J45901 Unspecified asthma with (acute) exacerbation: Secondary | ICD-10-CM | POA: Diagnosis not present

## 2013-07-06 DIAGNOSIS — J441 Chronic obstructive pulmonary disease with (acute) exacerbation: Secondary | ICD-10-CM | POA: Diagnosis not present

## 2013-07-06 DIAGNOSIS — R059 Cough, unspecified: Secondary | ICD-10-CM

## 2013-07-06 DIAGNOSIS — J189 Pneumonia, unspecified organism: Secondary | ICD-10-CM

## 2013-07-06 DIAGNOSIS — R0989 Other specified symptoms and signs involving the circulatory and respiratory systems: Secondary | ICD-10-CM

## 2013-07-06 DIAGNOSIS — R062 Wheezing: Secondary | ICD-10-CM

## 2013-07-06 DIAGNOSIS — R05 Cough: Secondary | ICD-10-CM

## 2013-07-06 MED ORDER — CEFDINIR 300 MG PO CAPS: 300.0000 mg | ORAL_CAPSULE | Freq: Two times a day (BID) | ORAL | Status: DC

## 2013-07-06 MED ORDER — METHYLPREDNISOLONE SODIUM SUCC 125 MG IJ SOLR
125.0000 mg | INTRAMUSCULAR | Status: AC
  Administered 2013-07-06: 125 mg via INTRAMUSCULAR

## 2013-07-06 MED ORDER — PREDNISONE (PAK) 10 MG PO TABS: ORAL_TABLET | ORAL | Status: DC

## 2013-07-06 MED ORDER — CEFTRIAXONE SODIUM 1 G IJ SOLR
1.0000 g | INTRAMUSCULAR | Status: AC
  Administered 2013-07-06: 1 g via INTRAMUSCULAR

## 2013-07-06 MED ORDER — IPRATROPIUM-ALBUTEROL 0.5-2.5 (3) MG/3ML IN SOLN
3.0000 mL | Freq: Once | RESPIRATORY_TRACT | Status: AC
  Administered 2013-07-06: 3 mL via RESPIRATORY_TRACT

## 2013-07-06 NOTE — Telephone Encounter (Signed)
Relevant patient education assigned to patient using Emmi. ° °

## 2013-07-06 NOTE — ED Provider Notes (Signed)
CSN: [id number]     Arrival date & time 07/06/13  1454 History   First MD Initiated Contact with Patient 07/06/13 1508     Chief Complaint  Patient presents with  . Cough    HPI URI HISTORY  Elsa is a 73 y.o. male who complains of onset of chest congestion, cough productive of discolored sputum for 3 days. Associated with increased wheezing and dyspnea on exertion. He has long-standing history of severe COPD/Asthma, uses home oxygen at night. He still smokes cigarettes, and he states he's not able to quit. He describes a similar flareup last year that resolved with treatment. He's not tried any OTC treatment. He's here with wife.  No chills/sweats He is felt feverish, but has not documented his temperature  +  Nasal congestion No  Discolored Post-nasal drainage No sinus pain/pressure No sore throat  +  Cough. + wheezing + chest congestion No hemoptysis + shortness of breath No pleuritic pain. No exertional chest pain. The cough causes bilateral intercostal discomfort.  No itchy/red eyes No earache  No nausea No vomiting No abdominal pain No diarrhea  No skin rashes +  Fatigue No myalgias Mild, nonspecific headache . No syncope or focal neurologic symptoms. Past Medical History  Diagnosis Date  . COPD (chronic obstructive pulmonary disease)   . Allergy   . Diabetes mellitus   . Emphysema of lung   . GERD (gastroesophageal reflux disease)   . Hypertension   . Hyperlipidemia   . CAD (coronary artery disease)     a. s/p multiple PCIs to RCA and eventual CABG in 1999;  b.LHC 11/2003: CFX 95%, RCA occluded, SVG-RCA occluded, SVG-OM patent, native LAD patent. EF was 55%.  Patient had left to right collaterals and medical therapy was recommended    . Anxiety   . PVD (peripheral vascular disease)   . OA (osteoarthritis)   . Asthma   . Iron deficiency anemia   . Memory loss   . Insomnia   . DDD (degenerative disc disease), lumbar   . OSA (obstructive sleep apnea)    . Depression   . Benign prostatic hypertrophy   . Lumbar back pain     chronic  . Spinal stenosis   . Herniated disc   . History of colonic polyps   . Diverticulitis, colon   . Esophageal stricture   . Hx of colonoscopy   . Hiatal hernia   . HOH (hard of hearing)    Past Surgical History  Procedure Laterality Date  . Heart bypass    . Bilateral knee replacements    . Hemorrhoid surgery    . Carpal tunnel release    . Shoulder arthroscopy  right  . Coronary artery bypass graft  09/1997    double bypss  . Coronary angioplasty with stent placement  2/99  . Strabismus surgery  08/29/2011    Procedure: REPAIR STRABISMUS;  Surgeon: Derry Skill, MD;  Location: [place];  Service: Ophthalmology;  Laterality: Left;   Family History  Problem Relation Age of Onset  . Stroke Father   . Emphysema Father   . Heart disease    . Hypertension    . Hyperlipidemia    . Kidney cancer Daughter   . Allergies    . Colon cancer Neg Hx   . Diabetes     History  Substance Use Topics  . Smoking status: Current Every Day Smoker -- 1.00 packs/day for 40 years    Types: Cigarettes  .  Smokeless tobacco: Current User  . Alcohol Use: No    Review of Systems  All other systems reviewed and are negative.    Allergies  Celecoxib; Chicken protein; Fish-derived products; Morphine; Nalbuphine; Prednisone; and Venlafaxine  Home Medications   Current Outpatient Rx  Name  Route  Sig  Dispense  Refill  . ALPRAZolam (XANAX) 0.25 MG tablet      take 1 tablet by mouth three times a day if needed   90 tablet   2   . AMBULATORY NON FORMULARY MEDICATION      2 Liters home oxygen continuous         . Aspirin-Salicylamide-Caffeine (BC HEADACHE POWDER PO)   Oral   Take 1 Package by mouth 4 (four) times daily as needed (headache or pain).         Marland Kitchen docusate sodium (COLACE) 100 MG capsule   Oral   Take 200 mg by mouth daily.          Marland Kitchen donepezil (ARICEPT) 5 MG tablet      take 1 tablet  by mouth at bedtime   30 tablet   6   . DULoxetine (CYMBALTA) 60 MG capsule      take 1 capsule by mouth once daily   90 capsule   0     Overdue for yearly physical must see md b4 additio ...   . ferrous sulfate 325 (65 FE) MG EC tablet   Oral   Take 325 mg by mouth daily.          Marland Kitchen GLIPIZIDE XL 2.5 MG 24 hr tablet      take 1 tablet by mouth once daily ONLY for BLOOD SUGAR MORE THAN 180   30 tablet   11   . HYDROcodone-acetaminophen (LORCET) 10-650 MG per tablet   Oral   Take 1 tablet by mouth every 6 (six) hours as needed (for breakthrough pain). For pain         . metFORMIN (GLUCOPHAGE) 500 MG tablet      take 2 tablets by mouth twice a day   360 tablet   3   . Multiple Vitamin (MULTIVITAMIN) capsule   Oral   Take 1 capsule by mouth daily.          . nitroGLYCERIN (NITROSTAT) 0.4 MG SL tablet   Sublingual   Place 1 tablet (0.4 mg total) under the tongue every 5 (five) minutes as needed. For chest pain   25 tablet   5   . omeprazole (PRILOSEC) 20 MG capsule   Oral   Take 20 mg by mouth daily.         . pantoprazole (PROTONIX) 40 MG tablet   Oral   Take 1 tablet (40 mg total) by mouth daily.   90 tablet   3   . pravastatin (PRAVACHOL) 80 MG tablet      take 1 tablet by mouth once daily   90 tablet   3   . PROAIR HFA 108 (90 BASE) MCG/ACT inhaler      inhale 2 puffs by mouth four times a day if needed   8.5 g   2   . amLODipine (NORVASC) 2.5 MG tablet      take 1 tablet by mouth once daily   30 tablet   11   . fluticasone (FLONASE) 50 MCG/ACT nasal spray      instill 2 sprays into each nostril once daily   16 g  11   . tiotropium (SPIRIVA HANDIHALER) 18 MCG inhalation capsule   Inhalation   Place 1 capsule (18 mcg total) into inhaler and inhale daily.   30 capsule   12   . zolpidem (AMBIEN) 10 MG tablet   Oral   Take 10 mg by mouth at bedtime as needed. For sleep          BP 126/64  Pulse 70  Temp(Src) 98 F (36.7  C) (Oral)  Resp 18  Wt 153 lb (69.4 kg)  SpO2 90% Physical Exam  Nursing note and vitals reviewed. Constitutional: He is oriented to person, place, and time. He appears well-developed and well-nourished. No distress.  HENT:  Head: Normocephalic and atraumatic.  Right Ear: Tympanic membrane normal.  Left Ear: Tympanic membrane normal.  Nose: Nose normal.  Mouth/Throat: Oropharynx is clear and moist. No oropharyngeal exudate.  Eyes: Right eye exhibits no discharge. Left eye exhibits no discharge. No scleral icterus.  Neck: Neck supple.  Cardiovascular: Normal rate, regular rhythm and normal heart sounds.   Pulmonary/Chest: No respiratory distress. He has decreased breath sounds (Diffusely). He has wheezes (diffuse). He has rhonchi (Diffuse). He has no rales.  Appears chronically ill. Hacking cough noted. Not using accessory muscles. No acute respiratory distress. On room air, pulse ox is 90%, which is similar to his baseline   Abdominal: Soft. Normal appearance. He exhibits no distension. There is no tenderness.  Lymphadenopathy:    He has no cervical adenopathy.  Neurological: He is alert and oriented to person, place, and time. No cranial nerve deficit. Gait normal.  Skin: Skin is warm and dry. No rash noted. He is not diaphoretic.  Psychiatric: He has a normal mood and affect.    ED Course  Procedures (including critical care time) Labs Review Labs Reviewed - No data to display Imaging Review Dg Chest 2 View  07/06/2013   CLINICAL DATA:  Cough, chest congestion, wheezing  EXAM: CHEST  2 VIEW  COMPARISON:  Prior radiograph from 02/18/2013  FINDINGS: Median sternotomy wires with underlying surgical clips in CABG markers noted. Heart size is within normal limits. Atherosclerotic calcifications noted within the aortic arch.  Lungs are normally inflated. Patchy bibasilar opacities are present, right greater than left, suspicious for possible acute infectious pneumonitis. There is mild  central perihilar pulmonary vascular congestion without overt pulmonary edema. No pleural effusion or pneumothorax.  No acute osseous abnormality.  IMPRESSION: 1. Patchy bibasilar opacities, right greater than left. Findings may reflect infiltrates and/or vascular congestion. 2. Mild central perihilar pulmonary vascular congestion without overt pulmonary edema.   Electronically Signed   By: Jeannine Boga M.D.   On: 07/06/2013 15:49     MDM   1. Acute exacerbation of COPD with asthma   Treatment options discussed, as well as risks, benefits, alternatives. Pt refused the option of hospitalization. Patient and wife voiced understanding and agreement with the following plans:  DuoNeb nebulizer treatment given. Wheezing improved.--Still had some scattered rhonchi, with mild late expiratory wheezes, but much improved air excursion bilaterally. Pulse ox improved to 92% on room air. Solu-Medrol 125 mg IM stat. Rocephin 1 g IM stat Advised to quit smoking.  Today, we treated you for mild pneumonia and exacerbation of your emphysema. Chest x-ray shows questionable mild pneumonia. We gave you a shot of Solu-Medrol (steroid) and Rocephin, which is an antibiotic. We gave you a breathing treatment with duo Neb, and that improved the wheezing somewhat We sent your prescriptions to your pharmacy. Fill  those now. Prednisone 10 mg-6 day Dosepak Omnicef 300 mg, take one twice a day for 10 days Continue your other medications and home oxygen. We made Appointment with Dr. Linna Darner (Dr Jenny Reichmann, your PCP was not available) in 2 days-Thursday 4/2 at 1:30 pm  If any severe or worsening or new symptoms, go to emergency room immediately I printed out copy of AVS and gave to patient and wife, and I explained directions carefully. Questions invited and answered. Precautions discussed. Red flags discussed. Questions invited and answered. Patient and wife  voiced understanding and agreement.    Jacqulyn Cane,  MD 07/06/13 1630

## 2013-07-06 NOTE — Telephone Encounter (Signed)
Done hardcopy to robin  

## 2013-07-06 NOTE — ED Notes (Signed)
Pt c/o productive cough, congestion, SOB and runny nose without fever x 3-4 days.

## 2013-07-06 NOTE — ED Notes (Signed)
Per Dr. Carolyne Fiscal request, apt made with PCP office on 07/08/13 @ 1:30pm with Dr. Linna Darner.

## 2013-07-06 NOTE — Telephone Encounter (Signed)
Faxed hardcopy to [facility].  Alprazolam 0.25 mg #90 with 2 RF's

## 2013-07-06 NOTE — Discharge Instructions (Signed)
Today, we treated you for mild pneumonia and exacerbation of your emphysema. Chest x-ray shows questionable mild pneumonia. We gave you a shot of Solu-Medrol (steroid) and Rocephin, which is an antibiotic. We gave you a breathing treatment with duo Neb, and that improved the wheezing somewhat We sent your prescriptions to your pharmacy. Fill those now. Prednisone 10 mg-6 day Dosepak Omnicef 300 mg, take one twice a day for 10 days Continue your other medications and home oxygen. Appointment with Dr. Jenny Reichmann, your PCP in 2 days-Thursday.  If any severe or worsening or new symptoms, go to emergency room immediately

## 2013-07-07 ENCOUNTER — Other Ambulatory Visit: Payer: Self-pay | Admitting: Internal Medicine

## 2013-07-07 ENCOUNTER — Telehealth: Payer: Self-pay | Admitting: *Deleted

## 2013-07-08 ENCOUNTER — Ambulatory Visit (INDEPENDENT_AMBULATORY_CARE_PROVIDER_SITE_OTHER): Payer: Medicare Other | Admitting: Internal Medicine

## 2013-07-08 ENCOUNTER — Encounter: Payer: Self-pay | Admitting: Internal Medicine

## 2013-07-08 VITALS — BP 112/70 | HR 79 | Temp 97.1°F | Resp 16 | Wt 161.8 lb

## 2013-07-08 DIAGNOSIS — J441 Chronic obstructive pulmonary disease with (acute) exacerbation: Secondary | ICD-10-CM | POA: Diagnosis not present

## 2013-07-08 DIAGNOSIS — J189 Pneumonia, unspecified organism: Secondary | ICD-10-CM | POA: Diagnosis not present

## 2013-07-08 DIAGNOSIS — F17200 Nicotine dependence, unspecified, uncomplicated: Secondary | ICD-10-CM

## 2013-07-08 MED ORDER — IPRATROPIUM-ALBUTEROL 20-100 MCG/ACT IN AERS: 1.0000 | INHALATION_SPRAY | Freq: Four times a day (QID) | RESPIRATORY_TRACT | Status: DC | PRN

## 2013-07-08 NOTE — Progress Notes (Signed)
Pre visit review using our clinic review tool, if applicable. No additional management support is needed unless otherwise documented below in the visit note. 

## 2013-07-08 NOTE — Progress Notes (Signed)
   Subjective:    Patient ID: Tyler Oh., male    DOB: Sep 11, 1940, 73 y.o.   MRN: [id number]  HPI  Symptoms began 07/03/13 as cough; he was seen in urgent care 3/31. He was treated with IV Solu-Medrol and Rocephin. He was placed on Omnicef for 10 days as well as prednisone.  He does believe that he somewhat better.  He continues to have chills and sweats. He also has anterior chest pain with deep breathing.  He's had discomfort in the sinuses and associated sneezing. He continues to be short of breath and has associated wheezing  He does have a nebulizer at home.  He presents smokes 1.5 packs per day . He states he tried a   cigarette but had an anxiety attack.  The chest x-ray from urgent care was reviewed with him. This did show infiltrates in the lower lobes suggesting bilateral pneumonia. It also showed pulmonary hilar engorgement. Marked increased AP diameter the chest was noted on lateral film. There is evidence of previous bypass grafting and stents.    Review of Systems   Describes significant rhinitis which is caused his nose below  He is not having pelvic pain or otic discharge. He also denies purulent nasal discharge.     Objective:   Physical Exam General edentulous appearance: chronically ill appearing; adequately nourished; no acute distress or increased work of breathing is present.  No  lymphadenopathy about the head, neck, or axilla noted.   Eyes: No conjunctival inflammation or lid edema is present. There is no scleral icterus.  Ears:  External ear exam shows no significant lesions or deformities.  Otoscopic examination reveals clear canals, tympanic membranes are intact bilaterally without bulging, retraction, inflammation or discharge.  Nose:  External nasal examination shows no deformity or inflammation. Nasal mucosa are boggy and moist without lesions or exudates. No septal dislocation or deviation.No obstruction to airflow.   Oral exam: Edentulous;  lips and gums are healthy appearing.There is no oropharyngeal erythema or exudate noted.   Neck:  No deformities,  masses, or tenderness noted.    Heart:  Normal rate and regular rhythm. S1 and S2 normal without gallop, murmur, click, rub or other extra sounds.   Heart sounds heard best in the epigastrium.  Lungs: Diffuse harsh rhonchi and wheezes in all lung fields. Dramatically increased AP diameter. Chest fixed in inspiration  Extremities:  No cyanosis, edema, or clubbing  noted    Skin: skin is moist to touch       Assessment & Plan:  #1 bilateral lower lobe community-acquired pneumonia; clinical improvement on present regimen  #2 advanced emphysema with severe bronchospasm  #3 nicotine addiction  Plan see recommendations

## 2013-07-08 NOTE — Patient Instructions (Addendum)
Please think about quitting smoking. Review the risks we discussed. Please call 1-800-QUIT-NOW 512-388-5521) for free smoking cessation counseling. There are multiple options are to help you stop smoking. These include nicotine patches, nicotine gum, and the new nicotine vapor device.

## 2013-07-12 ENCOUNTER — Telehealth: Payer: Self-pay | Admitting: Internal Medicine

## 2013-07-12 NOTE — Telephone Encounter (Signed)
Relevant patient education mailed to patient.  

## 2013-07-13 ENCOUNTER — Other Ambulatory Visit: Payer: Self-pay

## 2013-07-13 MED ORDER — GLIPIZIDE ER 2.5 MG PO TB24: 2.5000 mg | ORAL_TABLET | Freq: Every day | ORAL | Status: DC

## 2013-07-27 DIAGNOSIS — M5430 Sciatica, unspecified side: Secondary | ICD-10-CM | POA: Diagnosis not present

## 2013-07-27 DIAGNOSIS — R209 Unspecified disturbances of skin sensation: Secondary | ICD-10-CM | POA: Diagnosis not present

## 2013-07-27 DIAGNOSIS — M545 Low back pain, unspecified: Secondary | ICD-10-CM | POA: Diagnosis not present

## 2013-07-27 DIAGNOSIS — Z79899 Other long term (current) drug therapy: Secondary | ICD-10-CM | POA: Diagnosis not present

## 2013-07-27 DIAGNOSIS — M5137 Other intervertebral disc degeneration, lumbosacral region: Secondary | ICD-10-CM | POA: Diagnosis not present

## 2013-07-27 DIAGNOSIS — M533 Sacrococcygeal disorders, not elsewhere classified: Secondary | ICD-10-CM | POA: Diagnosis not present

## 2013-07-27 DIAGNOSIS — M542 Cervicalgia: Secondary | ICD-10-CM | POA: Diagnosis not present

## 2013-08-13 DIAGNOSIS — M545 Low back pain, unspecified: Secondary | ICD-10-CM | POA: Diagnosis not present

## 2013-08-13 DIAGNOSIS — M5137 Other intervertebral disc degeneration, lumbosacral region: Secondary | ICD-10-CM | POA: Diagnosis not present

## 2013-08-13 DIAGNOSIS — R209 Unspecified disturbances of skin sensation: Secondary | ICD-10-CM | POA: Diagnosis not present

## 2013-08-13 DIAGNOSIS — M533 Sacrococcygeal disorders, not elsewhere classified: Secondary | ICD-10-CM | POA: Diagnosis not present

## 2013-08-13 DIAGNOSIS — Z79899 Other long term (current) drug therapy: Secondary | ICD-10-CM | POA: Diagnosis not present

## 2013-08-16 ENCOUNTER — Other Ambulatory Visit: Payer: Self-pay | Admitting: Anesthesiology

## 2013-08-16 DIAGNOSIS — S335XXA Sprain of ligaments of lumbar spine, initial encounter: Secondary | ICD-10-CM

## 2013-08-16 DIAGNOSIS — M545 Low back pain, unspecified: Secondary | ICD-10-CM

## 2013-08-18 ENCOUNTER — Encounter: Payer: Self-pay | Admitting: Internal Medicine

## 2013-08-18 ENCOUNTER — Ambulatory Visit (INDEPENDENT_AMBULATORY_CARE_PROVIDER_SITE_OTHER): Payer: Medicare Other

## 2013-08-18 ENCOUNTER — Ambulatory Visit (INDEPENDENT_AMBULATORY_CARE_PROVIDER_SITE_OTHER): Payer: Medicare Other | Admitting: Internal Medicine

## 2013-08-18 VITALS — BP 118/68 | HR 76 | Temp 97.7°F | Wt 151.5 lb

## 2013-08-18 DIAGNOSIS — E119 Type 2 diabetes mellitus without complications: Secondary | ICD-10-CM | POA: Diagnosis not present

## 2013-08-18 DIAGNOSIS — M545 Low back pain, unspecified: Secondary | ICD-10-CM

## 2013-08-18 DIAGNOSIS — S335XXA Sprain of ligaments of lumbar spine, initial encounter: Secondary | ICD-10-CM

## 2013-08-18 DIAGNOSIS — IMO0002 Reserved for concepts with insufficient information to code with codable children: Secondary | ICD-10-CM | POA: Diagnosis not present

## 2013-08-18 DIAGNOSIS — J449 Chronic obstructive pulmonary disease, unspecified: Secondary | ICD-10-CM | POA: Diagnosis not present

## 2013-08-18 DIAGNOSIS — M5137 Other intervertebral disc degeneration, lumbosacral region: Secondary | ICD-10-CM

## 2013-08-18 DIAGNOSIS — J4489 Other specified chronic obstructive pulmonary disease: Secondary | ICD-10-CM

## 2013-08-18 DIAGNOSIS — I10 Essential (primary) hypertension: Secondary | ICD-10-CM | POA: Diagnosis not present

## 2013-08-18 DIAGNOSIS — M48061 Spinal stenosis, lumbar region without neurogenic claudication: Secondary | ICD-10-CM

## 2013-08-18 MED ORDER — TIZANIDINE HCL 4 MG PO TABS: 4.0000 mg | ORAL_TABLET | Freq: Four times a day (QID) | ORAL | Status: DC | PRN

## 2013-08-18 MED ORDER — LIDOCAINE 5 % EX PTCH: 1.0000 | MEDICATED_PATCH | CUTANEOUS | Status: DC

## 2013-08-18 MED ORDER — PREDNISONE 10 MG PO TABS: ORAL_TABLET | ORAL | Status: DC

## 2013-08-18 NOTE — Assessment & Plan Note (Signed)
Pt reports no low sugars, but d/w wife and pt that if have low sugars, glipizie should be stopped

## 2013-08-18 NOTE — Progress Notes (Signed)
Subjective:    Patient ID: Tyler Oh., male    DOB: 22-Nov-1940, 73 y.o.   MRN: [id number]  HPI    Here with onset right lower back pain after a fall with striking the back on a foundation of the house x 2 wks.  Pt without bowel or bladder change, fever, wt loss,  worsening LE pain/numbness/weakness, gait change or falls. Can walk room to room at home.  Has lost several lbs recently for unclear reasons.  Was seen at pain clinic who ordered MRI LS spine, but o/w referred him here for further tx.  Pt admits to taking a "few extra" pain pills due to the current pain, asks for 5 days of vicodin to tide him over to next pain clinic appt.  MRI with spinal stenosis , no acute.   Past Medical History  Diagnosis Date  . COPD (chronic obstructive pulmonary disease)   . Allergy   . Diabetes mellitus   . Emphysema of lung   . GERD (gastroesophageal reflux disease)   . Hypertension   . Hyperlipidemia   . CAD (coronary artery disease)     a. s/p multiple PCIs to RCA and eventual CABG in 1999;  b.LHC 11/2003: CFX 95%, RCA occluded, SVG-RCA occluded, SVG-OM patent, native LAD patent. EF was 55%.  Patient had left to right collaterals and medical therapy was recommended    . Anxiety   . PVD (peripheral vascular disease)   . OA (osteoarthritis)   . Asthma   . Iron deficiency anemia   . Memory loss   . Insomnia   . DDD (degenerative disc disease), lumbar   . OSA (obstructive sleep apnea)   . Depression   . Benign prostatic hypertrophy   . Lumbar back pain     chronic  . Spinal stenosis   . Herniated disc   . History of colonic polyps   . Diverticulitis, colon   . Esophageal stricture   . Hx of colonoscopy   . Hiatal hernia   . HOH (hard of hearing)    Past Surgical History  Procedure Laterality Date  . Heart bypass    . Bilateral knee replacements    . Hemorrhoid surgery    . Carpal tunnel release    . Shoulder arthroscopy  right  . Coronary artery bypass graft  09/1997    double  bypss  . Coronary angioplasty with stent placement  2/99  . Strabismus surgery  08/29/2011    Procedure: REPAIR STRABISMUS;  Surgeon: Derry Skill, MD;  Location: [facility];  Service: Ophthalmology;  Laterality: Left;    reports that he has been smoking Cigarettes.  He has a 40 pack-year smoking history. He uses smokeless tobacco. He reports that he does not drink alcohol or use illicit drugs. family history includes Allergies in an other family member; Diabetes in an other family member; Emphysema in his father; Heart disease in an other family member; Hyperlipidemia in an other family member; Hypertension in an other family member; Kidney cancer in his daughter; Stroke in his father. There is no history of Colon cancer. Allergies  Allergen Reactions  . Celecoxib Nausea And Vomiting    REACTION: GI upset  . Chicken Protein Other (See Comments)    Will not eat chicken  . Fish-Derived Products Other (See Comments)    Will not eat seafood of any kind  . Morphine Itching    REACTION: itching  . Nalbuphine Other (See Comments)  REACTION: contraindication with oxycontin.  . Prednisone Other (See Comments)    REACTION: "yeast infections"  . Venlafaxine Other (See Comments)    REACTION: GI upset   Current Outpatient Prescriptions on File Prior to Visit  Medication Sig Dispense Refill  . ALPRAZolam (XANAX) 0.25 MG tablet take 1 tablet by mouth three times a day if needed  90 tablet  2  . AMBULATORY NON FORMULARY MEDICATION 2 Liters home oxygen continuous      . Aspirin-Salicylamide-Caffeine (BC HEADACHE POWDER PO) Take 1 Package by mouth 4 (four) times daily as needed (headache or pain).      Marland Kitchen docusate sodium (COLACE) 100 MG capsule Take 200 mg by mouth daily.       Marland Kitchen donepezil (ARICEPT) 5 MG tablet take 1 tablet by mouth at bedtime  30 tablet  6  . DULoxetine (CYMBALTA) 60 MG capsule take 1 capsule by mouth once daily  90 capsule  2  . ferrous sulfate 325 (65 FE) MG EC tablet Take 325 mg by  mouth daily.       . fluticasone (FLONASE) 50 MCG/ACT nasal spray instill 2 sprays into each nostril once daily  16 g  11  . glipiZIDE (GLIPIZIDE XL) 2.5 MG 24 hr tablet Take 1 tablet (2.5 mg total) by mouth daily.  90 tablet  3  . metFORMIN (GLUCOPHAGE) 500 MG tablet take 2 tablets by mouth twice a day  360 tablet  3  . Multiple Vitamin (MULTIVITAMIN) capsule Take 1 capsule by mouth daily.       . nitroGLYCERIN (NITROSTAT) 0.4 MG SL tablet Place 1 tablet (0.4 mg total) under the tongue every 5 (five) minutes as needed. For chest pain  25 tablet  5  . omeprazole (PRILOSEC) 20 MG capsule Take 20 mg by mouth daily.      . pravastatin (PRAVACHOL) 80 MG tablet take 1 tablet by mouth once daily  90 tablet  3  . PROAIR HFA 108 (90 BASE) MCG/ACT inhaler inhale 2 puffs by mouth four times a day if needed  8.5 g  2  . tiotropium (SPIRIVA HANDIHALER) 18 MCG inhalation capsule Place 1 capsule (18 mcg total) into inhaler and inhale daily.  30 capsule  12  . zolpidem (AMBIEN) 10 MG tablet Take 10 mg by mouth at bedtime as needed. For sleep       No current facility-administered medications on file prior to visit.   Review of Systems  Constitutional: Negative for unusual diaphoresis or other sweats  HENT: Negative for ringing in ear Eyes: Negative for double vision or worsening visual disturbance.  Respiratory: Negative for choking and stridor.   Gastrointestinal: Negative for vomiting or other signifcant bowel change Genitourinary: Negative for hematuria or decreased urine volume.  Musculoskeletal: Negative for other MSK pain or swelling Skin: Negative for color change and worsening wound.  Neurological: Negative for tremors and numbness other than noted  Psychiatric/Behavioral: Negative for decreased concentration or agitation other than above       Objective:   Physical Exam BP 118/68  Pulse 76  Temp(Src) 97.7 F (36.5 C) (Oral)  Wt 151 lb 8 oz (68.72 kg)  SpO2 95% VS noted,  Constitutional:  Pt appears well-developed, well-nourished.  HENT: Head: NCAT.  Right Ear: External ear normal.  Left Ear: External ear normal.  Eyes: . Pupils are equal, round, and reactive to light. Conjunctivae and EOM are normal Neck: Normal range of motion. Neck supple.  Cardiovascular: Normal rate and regular rhythm.  Pulmonary/Chest: Effort normal and breath sounds normal.  Abd:  Soft, NT, ND, + BS Spine diffuse tender, also right lumbar paravertebral tender, no swelling/red Neurological: Pt is alert. Not confused , motor grossly intact Skin: Skin is warm. No rash Psychiatric: Pt behavior is normal. No agitation.         Assessment & Plan:

## 2013-08-18 NOTE — Progress Notes (Signed)
Pre visit review using our clinic review tool, if applicable. No additional management support is needed unless otherwise documented below in the visit note. 
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
## 2013-08-18 NOTE — Assessment & Plan Note (Signed)
stable overall by history and exam, recent data reviewed with pt, and pt to continue medical treatment as before,  to f/u any worsening symptoms or concerns BP Readings from Last 3 Encounters:  08/18/13 118/68  07/08/13 112/70  07/06/13 126/64

## 2013-08-18 NOTE — Assessment & Plan Note (Signed)
stable overall by history and exam, recent data reviewed with pt, and pt to continue medical treatment as before,  to f/u any worsening symptoms or concerns SpO2 Readings from Last 3 Encounters:  08/18/13 95%  07/08/13 96%  07/06/13 92%

## 2013-08-18 NOTE — Patient Instructions (Signed)
Please take all new medication as prescribed  - the muscle relaxer, pain patch, and prednisone  Please continue all other medications as before, and refills have been done if requested. Please have the pharmacy call with any other refills you may need.  Please keep your appointments with your specialists as you have planned - the pain clinic

## 2013-08-20 ENCOUNTER — Telehealth: Payer: Self-pay | Admitting: Internal Medicine

## 2013-08-20 NOTE — Telephone Encounter (Signed)
Patient's wife called and states that [facility] faxed over PA request for the patient's Lidocaine patches yesterday. She wants to know the status.

## 2013-08-20 NOTE — Telephone Encounter (Signed)
Called the patients wife informed have received PA form and will get to it to do the first of the week.

## 2013-08-24 ENCOUNTER — Telehealth: Payer: Self-pay | Admitting: *Deleted

## 2013-08-24 DIAGNOSIS — M545 Low back pain, unspecified: Secondary | ICD-10-CM | POA: Diagnosis not present

## 2013-08-24 DIAGNOSIS — M5430 Sciatica, unspecified side: Secondary | ICD-10-CM | POA: Diagnosis not present

## 2013-08-24 DIAGNOSIS — M533 Sacrococcygeal disorders, not elsewhere classified: Secondary | ICD-10-CM | POA: Diagnosis not present

## 2013-08-24 DIAGNOSIS — S335XXA Sprain of ligaments of lumbar spine, initial encounter: Secondary | ICD-10-CM | POA: Diagnosis not present

## 2013-08-24 DIAGNOSIS — M5137 Other intervertebral disc degeneration, lumbosacral region: Secondary | ICD-10-CM | POA: Diagnosis not present

## 2013-08-24 DIAGNOSIS — Z79899 Other long term (current) drug therapy: Secondary | ICD-10-CM | POA: Diagnosis not present

## 2013-08-24 DIAGNOSIS — G894 Chronic pain syndrome: Secondary | ICD-10-CM | POA: Diagnosis not present

## 2013-08-24 NOTE — Telephone Encounter (Signed)
Received fax pt needing PA on lidoderm patches. PA was completed through cover my meds. Received PA back med was denied. Place on md desk to review...[name]

## 2013-08-24 NOTE — Telephone Encounter (Signed)
I dont have anthing further to offer. Pt needs to fu with pain management as planned

## 2013-08-25 NOTE — Telephone Encounter (Signed)
Called left message to call back 

## 2013-08-25 NOTE — Telephone Encounter (Signed)
Patients wife informed of denial and MD's instruction.

## 2013-09-06 DIAGNOSIS — F0390 Unspecified dementia without behavioral disturbance: Secondary | ICD-10-CM | POA: Diagnosis not present

## 2013-09-06 DIAGNOSIS — F331 Major depressive disorder, recurrent, moderate: Secondary | ICD-10-CM | POA: Diagnosis not present

## 2013-09-06 DIAGNOSIS — F411 Generalized anxiety disorder: Secondary | ICD-10-CM | POA: Diagnosis not present

## 2013-09-06 DIAGNOSIS — F0393 Unspecified dementia, unspecified severity, with mood disturbance: Secondary | ICD-10-CM | POA: Diagnosis not present

## 2013-09-07 DIAGNOSIS — M545 Low back pain, unspecified: Secondary | ICD-10-CM | POA: Diagnosis not present

## 2013-09-07 DIAGNOSIS — M5030 Other cervical disc degeneration, unspecified cervical region: Secondary | ICD-10-CM | POA: Diagnosis not present

## 2013-09-07 DIAGNOSIS — M5137 Other intervertebral disc degeneration, lumbosacral region: Secondary | ICD-10-CM | POA: Diagnosis not present

## 2013-09-07 DIAGNOSIS — M533 Sacrococcygeal disorders, not elsewhere classified: Secondary | ICD-10-CM | POA: Diagnosis not present

## 2013-09-07 DIAGNOSIS — Z79899 Other long term (current) drug therapy: Secondary | ICD-10-CM | POA: Diagnosis not present

## 2013-09-07 DIAGNOSIS — G894 Chronic pain syndrome: Secondary | ICD-10-CM | POA: Diagnosis not present

## 2013-09-28 ENCOUNTER — Encounter: Payer: Self-pay | Admitting: Internal Medicine

## 2013-10-05 ENCOUNTER — Telehealth: Payer: Self-pay | Admitting: *Deleted

## 2013-10-05 DIAGNOSIS — M5430 Sciatica, unspecified side: Secondary | ICD-10-CM | POA: Diagnosis not present

## 2013-10-05 DIAGNOSIS — M545 Low back pain, unspecified: Secondary | ICD-10-CM | POA: Diagnosis not present

## 2013-10-05 DIAGNOSIS — Z79899 Other long term (current) drug therapy: Secondary | ICD-10-CM | POA: Diagnosis not present

## 2013-10-05 DIAGNOSIS — E785 Hyperlipidemia, unspecified: Secondary | ICD-10-CM

## 2013-10-05 DIAGNOSIS — M542 Cervicalgia: Secondary | ICD-10-CM | POA: Diagnosis not present

## 2013-10-05 DIAGNOSIS — G894 Chronic pain syndrome: Secondary | ICD-10-CM | POA: Diagnosis not present

## 2013-10-05 DIAGNOSIS — M5137 Other intervertebral disc degeneration, lumbosacral region: Secondary | ICD-10-CM | POA: Diagnosis not present

## 2013-10-05 DIAGNOSIS — M533 Sacrococcygeal disorders, not elsewhere classified: Secondary | ICD-10-CM | POA: Diagnosis not present

## 2013-10-05 DIAGNOSIS — E119 Type 2 diabetes mellitus without complications: Secondary | ICD-10-CM

## 2013-10-05 NOTE — Telephone Encounter (Signed)
Spoke with wife and patient will come in for a lipid panel. FYI.

## 2013-10-07 ENCOUNTER — Other Ambulatory Visit: Payer: Self-pay | Admitting: Internal Medicine

## 2013-10-07 NOTE — Telephone Encounter (Signed)
Faxed hardcopy to [place]

## 2013-10-07 NOTE — Telephone Encounter (Signed)
Done hardcopy to robin  

## 2013-10-15 ENCOUNTER — Encounter: Payer: Self-pay | Admitting: Internal Medicine

## 2013-10-15 ENCOUNTER — Other Ambulatory Visit (INDEPENDENT_AMBULATORY_CARE_PROVIDER_SITE_OTHER): Payer: Medicare Other

## 2013-10-15 ENCOUNTER — Ambulatory Visit (INDEPENDENT_AMBULATORY_CARE_PROVIDER_SITE_OTHER)
Admission: RE | Admit: 2013-10-15 | Discharge: 2013-10-15 | Disposition: A | Discharge status: Home / Self Care | Payer: Medicare Other | Source: Ambulatory Visit | Attending: Internal Medicine | Admitting: Internal Medicine

## 2013-10-15 ENCOUNTER — Ambulatory Visit (INDEPENDENT_AMBULATORY_CARE_PROVIDER_SITE_OTHER): Payer: Medicare Other | Admitting: Internal Medicine

## 2013-10-15 VITALS — BP 112/62 | HR 66 | Temp 98.3°F | Wt 151.0 lb

## 2013-10-15 DIAGNOSIS — R296 Repeated falls: Secondary | ICD-10-CM

## 2013-10-15 DIAGNOSIS — E119 Type 2 diabetes mellitus without complications: Secondary | ICD-10-CM

## 2013-10-15 DIAGNOSIS — E785 Hyperlipidemia, unspecified: Secondary | ICD-10-CM | POA: Diagnosis not present

## 2013-10-15 DIAGNOSIS — R0902 Hypoxemia: Secondary | ICD-10-CM | POA: Diagnosis not present

## 2013-10-15 DIAGNOSIS — Z9181 History of falling: Secondary | ICD-10-CM

## 2013-10-15 DIAGNOSIS — J9611 Chronic respiratory failure with hypoxia: Secondary | ICD-10-CM

## 2013-10-15 DIAGNOSIS — R5381 Other malaise: Secondary | ICD-10-CM

## 2013-10-15 DIAGNOSIS — R5383 Other fatigue: Secondary | ICD-10-CM | POA: Diagnosis not present

## 2013-10-15 DIAGNOSIS — R5382 Chronic fatigue, unspecified: Secondary | ICD-10-CM

## 2013-10-15 DIAGNOSIS — J9610 Chronic respiratory failure, unspecified whether with hypoxia or hypercapnia: Secondary | ICD-10-CM | POA: Diagnosis not present

## 2013-10-15 DIAGNOSIS — J9600 Acute respiratory failure, unspecified whether with hypoxia or hypercapnia: Secondary | ICD-10-CM | POA: Diagnosis not present

## 2013-10-15 LAB — CBC WITH DIFFERENTIAL/PLATELET
BASOS ABS: 0 10*3/uL (ref 0.0–0.1)
Basophils Relative: 0.6 % (ref 0.0–3.0)
Eosinophils Absolute: 0.2 10*3/uL (ref 0.0–0.7)
Eosinophils Relative: 3.7 % (ref 0.0–5.0)
HCT: 38.3 % — ABNORMAL LOW (ref 39.0–52.0)
Hemoglobin: 12.8 g/dL — ABNORMAL LOW (ref 13.0–17.0)
LYMPHS PCT: 34.3 % (ref 12.0–46.0)
Lymphs Abs: 2.1 10*3/uL (ref 0.7–4.0)
MCHC: 33.4 g/dL (ref 30.0–36.0)
MCV: 96.3 fl (ref 78.0–100.0)
Monocytes Absolute: 0.7 10*3/uL (ref 0.1–1.0)
Monocytes Relative: 12 % (ref 3.0–12.0)
NEUTROS PCT: 49.4 % (ref 43.0–77.0)
Neutro Abs: 3 10*3/uL (ref 1.4–7.7)
Platelets: 213 10*3/uL (ref 150.0–400.0)
RBC: 3.98 Mil/uL — AB (ref 4.22–5.81)
RDW: 17.5 % — AB (ref 11.5–15.5)
WBC: 6.1 10*3/uL (ref 4.0–10.5)

## 2013-10-15 LAB — TSH: TSH: 2.04 u[IU]/mL (ref 0.35–4.50)

## 2013-10-15 LAB — LIPID PANEL
CHOL/HDL RATIO: 4
Cholesterol: 152 mg/dL (ref 0–200)
HDL: 42.1 mg/dL (ref >39.00)
LDL Cholesterol: 80 mg/dL (ref 0–99)
NONHDL: 109.9
Triglycerides: 151 mg/dL — ABNORMAL HIGH (ref 0.0–149.0)
VLDL: 30.2 mg/dL (ref 0.0–40.0)

## 2013-10-15 LAB — CK: CK TOTAL: 85 U/L (ref 7–232)

## 2013-10-15 LAB — HEPATIC FUNCTION PANEL
ALBUMIN: 3.7 g/dL (ref 3.5–5.2)
ALT: 15 U/L (ref 0–53)
AST: 22 U/L (ref 0–37)
Alkaline Phosphatase: 65 U/L (ref 39–117)
Bilirubin, Direct: 0.1 mg/dL (ref 0.0–0.3)
Total Bilirubin: 0.5 mg/dL (ref 0.2–1.2)
Total Protein: 7.1 g/dL (ref 6.0–8.3)

## 2013-10-15 LAB — BASIC METABOLIC PANEL
BUN: 25 mg/dL — AB (ref 6–23)
CO2: 28 mEq/L (ref 19–32)
Calcium: 9.2 mg/dL (ref 8.4–10.5)
Chloride: 102 mEq/L (ref 96–112)
Creatinine, Ser: 1.2 mg/dL (ref 0.4–1.5)
GFR: 61.14 mL/min (ref >60.00)
GLUCOSE: 103 mg/dL — AB (ref 70–99)
POTASSIUM: 4.3 meq/L (ref 3.5–5.1)
Sodium: 139 mEq/L (ref 135–145)

## 2013-10-15 LAB — HEMOGLOBIN A1C: HEMOGLOBIN A1C: 6.9 % — AB (ref 4.6–6.5)

## 2013-10-15 NOTE — Progress Notes (Signed)
   Subjective:    Patient ID: Tyler Oh., male    DOB: Jul 31, 1940, 73 y.o.   MRN: [id number]  HPI  He presents with fatigue  & weakness. He's had 2 falls in the last 4 days.  The first occurred when he bent down to place a door stop. The second occurred as he was trying to get off the commode. He attributes falls to weakness in his legs.  He is unable to recall whether he had any neuro or cardiac prodrome prior to the falls. He is very vague as to details of the events & his responses are variable. He states :"I can't remember". His last few weeks his glucoses have averaged 100-110. Food portion intake has decreased recently w/o specific anorexia complaint. He only takes the glipizide with blood sugars greater than 180. The been taking the metformin twice a day.    Labs 7/14 were reviewed. Glucoses ranged from 149-210. His A1c at that time was non diabetic at 5.8%.  Review of Systems  Denied were any definite change in heart rhythm or rate prior to the event. There was no associated chest pain or shortness of breath beyond his chronic COPD related SOB .  Also specifically denied prior to the episode were headache, limb weakness, tingling, or numbness. No seizure activity noted.      Objective:   Physical Exam  Significant or distinguishing  findings on physical exam are documented first.  Below that are other systems examined & findings.   He appears chronically ill but adequately nourished.Broad unstable gait . Using cane. He is completely edentulous Heart sounds were essentially totally obscured by diffuse,harsh  rhonchi and rales in all lung fields. The chest is fixed in inspiration. Diaphragmatic excursion is profoundly decreased. Tactile fremitus is markedly increased Slight clubbing of the nailbeds is suggested Subtle tremor of hands . He has decreased dorsalis pedis pulses. Scattered ecchymotic and eschar-type lesions are present on the forearms.  Eyes: No  conjunctival inflammation or scleral icterus is present. Oral exam:  lips and gums are healthy appearing.There is no oropharyngeal erythema or exudate noted.  Abdomen: bowel sounds normal, soft and non-tender without masses, organomegaly or hernias noted.   Skin:Warm & dry.  Intact without suspicious lesions or rashes ; no jaundice  Lymphatic: No lymphadenopathy is noted about the head, neck, axilla  He gave month & year and name of President. He was also able to give his anniversary date.           Assessment & Plan:  #1 fatigue, multi factorial with recurrent falls. Adult failure to thrive suggested clinically. #2 end stage COPD with hypoxemia.Ongoing tobacco abuse . Risks , especially cardiac, discussed #3 DM , ? Status Plan: See orders and recommendations

## 2013-10-15 NOTE — Progress Notes (Signed)
   Subjective:    Patient ID: Tyler Oh., male    DOB: [birth year], 73 y.o.   MRN: [id number]  HPI Pt presents today with fatigue and weakness. He has fallen twice within the past 4 days. The first fall occurred while the pt was bending down to place a door stopper. The second fall occurred as the pt was trying to get off the commode. The pt attributes the falls due to weakness in his legs. He is unable to recall whether or not he had any chest pain, dizziness, or increased SOB associated with the falls.  Pt has been checking his BS. The pt's wife states his BS has been lower than usual. His sugars are typically in the 150s, but in the past few weeks they have been on average 100-110. The pt's wife states the pt has been eating less than usual. The pt is only taking the glipizide when his BS is greater than 180. Recently he has only been taking the metformin BID.  The pt wears home oxygen at night. Today his O2 sat is 90% in clinic.     Review of Systems  Constitutional: Negative for fever and chills.  Respiratory:       Baseline SOB  Cardiovascular: Negative for chest pain.  Gastrointestinal: Negative for blood in stool.       No melena. Pt reports abd pain x 2 years  Genitourinary: Negative for dysuria and hematuria.  Neurological: Negative for numbness.      Objective:   Physical Exam        Assessment & Plan:

## 2013-10-15 NOTE — Patient Instructions (Signed)
Your next office appointment will be determined based upon review of your pending labs & x-rays. Those instructions will be transmitted to you through My Chart   Followup as needed for your acute issue. Please report any significant change in your symptoms.  Please think about quitting smoking. Review the risks we discussed. Please call 1-800-QUIT-NOW [phone]) for free smoking cessation counseling.

## 2013-10-15 NOTE — Progress Notes (Signed)
Pre visit review using our clinic review tool, if applicable. No additional management support is needed unless otherwise documented below in the visit note. 

## 2013-10-16 ENCOUNTER — Other Ambulatory Visit: Payer: Self-pay | Admitting: Internal Medicine

## 2013-10-16 DIAGNOSIS — E1159 Type 2 diabetes mellitus with other circulatory complications: Secondary | ICD-10-CM

## 2013-10-16 DIAGNOSIS — D509 Iron deficiency anemia, unspecified: Secondary | ICD-10-CM

## 2013-10-26 ENCOUNTER — Encounter: Payer: Self-pay | Admitting: Internal Medicine

## 2013-10-26 NOTE — Telephone Encounter (Signed)
[name] to note above

## 2013-10-27 ENCOUNTER — Other Ambulatory Visit: Payer: Self-pay

## 2013-10-27 DIAGNOSIS — F331 Major depressive disorder, recurrent, moderate: Secondary | ICD-10-CM | POA: Diagnosis not present

## 2013-10-27 DIAGNOSIS — F411 Generalized anxiety disorder: Secondary | ICD-10-CM | POA: Diagnosis not present

## 2013-10-27 DIAGNOSIS — F0393 Unspecified dementia, unspecified severity, with mood disturbance: Secondary | ICD-10-CM | POA: Diagnosis not present

## 2013-10-27 DIAGNOSIS — F0390 Unspecified dementia without behavioral disturbance: Secondary | ICD-10-CM | POA: Diagnosis not present

## 2013-10-27 MED ORDER — PRAVASTATIN SODIUM 80 MG PO TABS: 80.0000 mg | ORAL_TABLET | Freq: Every day | ORAL | Status: DC

## 2013-10-28 ENCOUNTER — Ambulatory Visit (INDEPENDENT_AMBULATORY_CARE_PROVIDER_SITE_OTHER): Payer: Medicare Other | Admitting: Cardiovascular Disease

## 2013-10-28 ENCOUNTER — Encounter: Payer: Self-pay | Admitting: Cardiovascular Disease

## 2013-10-28 VITALS — BP 119/71 | HR 49 | Ht 70.0 in | Wt 150.0 lb

## 2013-10-28 DIAGNOSIS — I10 Essential (primary) hypertension: Secondary | ICD-10-CM

## 2013-10-28 DIAGNOSIS — I2510 Atherosclerotic heart disease of native coronary artery without angina pectoris: Secondary | ICD-10-CM | POA: Diagnosis not present

## 2013-10-28 DIAGNOSIS — F17200 Nicotine dependence, unspecified, uncomplicated: Secondary | ICD-10-CM | POA: Diagnosis not present

## 2013-10-28 DIAGNOSIS — E785 Hyperlipidemia, unspecified: Secondary | ICD-10-CM

## 2013-10-28 MED ORDER — NITROGLYCERIN 0.4 MG SL SUBL: 0.4000 mg | SUBLINGUAL_TABLET | SUBLINGUAL | Status: DC | PRN

## 2013-10-28 NOTE — Progress Notes (Signed)
History of Present Illness: 73 yo male with history of CAD s/p CABG 1999, HTN, HLD, COPD, ongoing tobacco abuse, DM, GERD, PAD here today for follow up. He has been followed by Dr. [name]. I am meeting him for the first time today. Last cath in August 2005 demonstrated mild LAD disease, patent large OM branch with occluded distal Circumflex filling by vein graft, occluded RCA with occluded vein graft to RCA, RCA filling from left to right collaterals. He has been managed medically since that time by Dr. [name]. No recent assessment of LVEF. He continues to smoke.   He is here today for follow up. He describes chest pressure over the last few days. This is described as squeezing for a few seconds. This has happened several times over the last few days. No associated worsening dyspnea, diaphoresis or nausea. No exertional chest pain. Mostly at rest.   Primary Care Physician: Cathlean Cower  Last Lipid Profile:Lipid Panel     Component Value Date/Time   CHOL 152 10/15/2013 0750   TRIG 151.0* 10/15/2013 0750   HDL 42.10 10/15/2013 0750   CHOLHDL 4 10/15/2013 0750   VLDL 30.2 10/15/2013 0750   LDLCALC 80 10/15/2013 0750    Past Medical History  Diagnosis Date  . COPD (chronic obstructive pulmonary disease)   . Allergy   . Diabetes mellitus   . Emphysema of lung   . GERD (gastroesophageal reflux disease)   . Hypertension   . Hyperlipidemia   . CAD (coronary artery disease)     a. s/p multiple PCIs to RCA and eventual CABG in 1999;  b.LHC 11/2003: CFX 95%, RCA occluded, SVG-RCA occluded, SVG-OM patent, native LAD patent. EF was 55%.  Patient had left to right collaterals and medical therapy was recommended    . Anxiety   . PVD (peripheral vascular disease)   . OA (osteoarthritis)   . Asthma   . Iron deficiency anemia   . Memory loss   . Insomnia   . DDD (degenerative disc disease), lumbar   . OSA (obstructive sleep apnea)   . Depression   . Benign prostatic hypertrophy   . Lumbar back pain       chronic  . Spinal stenosis   . Herniated disc   . History of colonic polyps   . Diverticulitis, colon   . Esophageal stricture   . Hx of colonoscopy   . Hiatal hernia   . HOH (hard of hearing)     Past Surgical History  Procedure Laterality Date  . Heart bypass    . Bilateral knee replacements    . Hemorrhoid surgery    . Carpal tunnel release    . Shoulder arthroscopy  right  . Coronary artery bypass graft  09/1997    double bypss  . Coronary angioplasty with stent placement  2/99  . Strabismus surgery  08/29/2011    Procedure: REPAIR STRABISMUS;  Surgeon: Derry Skill, MD;  Location: [facility];  Service: Ophthalmology;  Laterality: Left;    Current Outpatient Prescriptions  Medication Sig Dispense Refill  . ALPRAZolam (XANAX) 0.25 MG tablet take 1 tablet by mouth three times a day if needed  90 tablet  2  . AMBULATORY NON FORMULARY MEDICATION 2.5 Liters home oxygen continuous      . Aspirin-Salicylamide-Caffeine (BC HEADACHE POWDER PO) Take 1 Package by mouth 4 (four) times daily as needed (headache or pain).      Marland Kitchen docusate sodium (COLACE) 100 MG capsule Take  200 mg by mouth daily.       Marland Kitchen donepezil (ARICEPT) 5 MG tablet take 1 tablet by mouth at bedtime  30 tablet  6  . DULoxetine (CYMBALTA) 60 MG capsule take 1 capsule by mouth once daily  90 capsule  2  . fluticasone (FLONASE) 50 MCG/ACT nasal spray instill 2 sprays into each nostril once daily  16 g  11  . glipiZIDE (GLUCOTROL XL) 2.5 MG 24 hr tablet Take 2.5 mg by mouth daily. Take only if blood sugar is greater than 180      . metFORMIN (GLUCOPHAGE) 500 MG tablet take 2 tablets by mouth twice a day  360 tablet  3  . Multiple Vitamin (MULTIVITAMIN) capsule Take 1 capsule by mouth daily.       . nitroGLYCERIN (NITROSTAT) 0.4 MG SL tablet Place 1 tablet (0.4 mg total) under the tongue every 5 (five) minutes as needed. For chest pain  25 tablet  5  . omeprazole (PRILOSEC) 20 MG capsule Take 20 mg by mouth daily.      .  OxyCODONE (OXYCONTIN) 40 mg T12A 12 hr tablet Take 40 mg by mouth 3 (three) times daily.      Marland Kitchen oxycodone (ROXICODONE) 30 MG immediate release tablet Take 30 mg by mouth every 4 (four) hours as needed for pain.      . pravastatin (PRAVACHOL) 80 MG tablet Take 1 tablet (80 mg total) by mouth daily.  90 tablet  3  . PROAIR HFA 108 (90 BASE) MCG/ACT inhaler inhale 2 puffs by mouth four times a day if needed  8.5 g  2  . zolpidem (AMBIEN) 10 MG tablet Take 10 mg by mouth at bedtime as needed. For sleep       No current facility-administered medications for this visit.    Allergies  Allergen Reactions  . Celecoxib Nausea And Vomiting    REACTION: GI upset  . Chicken Protein Other (See Comments)    Will not eat chicken  . Fish-Derived Products Other (See Comments)    Will not eat seafood of any kind  . Morphine Itching    REACTION: itching  . Nalbuphine Other (See Comments)    REACTION: contraindication with oxycontin.  . Prednisone Other (See Comments)    REACTION: "yeast infections"  . Venlafaxine Other (See Comments)    REACTION: GI upset    History   Social History  . Marital Status: Married    Spouse Name: N/A    Number of Children: 42  . Years of Education: N/A   Occupational History  . Retired-construction    Social History Main Topics  . Smoking status: Current Every Day Smoker -- 1.00 packs/day for 40 years    Types: Cigarettes  . Smokeless tobacco: Current User  . Alcohol Use: No  . Drug Use: No  . Sexual Activity: Not on file   Other Topics Concern  . Not on file   Social History Narrative  . No narrative on file    Family History  Problem Relation Age of Onset  . Stroke Father   . Emphysema Father   . Heart disease    . Hypertension    . Hyperlipidemia    . Kidney cancer Daughter   . Allergies    . Colon cancer Neg Hx   . Diabetes      Review of Systems:  As stated in the HPI and otherwise negative.   BP 119/71  Pulse 49  Ht 5'  10" (1.778 m)   Wt 150 lb (68.04 kg)  BMI 21.52 kg/m2  Physical Examination: General: Well developed, well nourished, NAD HEENT: OP clear, mucus membranes moist SKIN: warm, dry. No rashes. Neuro: No focal deficits Musculoskeletal: Muscle strength 5/5 all ext Psychiatric: Mood and affect normal Neck: No JVD, no carotid bruits, no thyromegaly, no lymphadenopathy. Lungs:Bilateral wheezes. No rhonci, crackles Cardiovascular: Regular rate and rhythm. No murmurs, gallops or rubs. Abdomen:Soft. Bowel sounds present. Non-tender.  Extremities: No lower extremity edema. Pulses are palpable in the bilateral DP/PT.  Cardiac cath August 2005:  The main left coronary was normal.  The left anterior descending artery had some minimal irregularities, coursed  to the apex of the heart where it bifurcated. It was essentially widely  patent with no significant stenosis and good flow throughout. There was  some bend at the junction of the mid portion and the junction of the  distal third of the RCA possibly representing bridging. However, there was  no systolic compromise present and there was good flow throughout.  There was bifurcating moderate size first diagonal before SP1 in the  proximal LAD that was normal. There was a small to moderate size DX2 at the  junction of the proximal third that was normal and there was a small DX3  from the mid LAD that was normal.  The circumflex artery gave off a moderate size long OM-1 that had no  significant stenosis, arose very proximally. The moderate size OM-2 had no  significant stenosis just beyond this and bifurcated. There was 50% smooth  narrowing just beyond this and before the PABG branch. The PABG branch  provided grade 3 collaterals to the distal RCA. There were also collaterals  from the distal LAD to the distal RCA. The PDA and PLA were visualized up  to their distal origin.  The circumflex artery beyond the PABG branch had 95% segmental stenosis  before the  insertion of the graft which was seen on retrograde filling.  The right coronary was totally occluded in its proximal third just before  the previously placed tandem proximal and mid RCA stents. There was no  antegrade flow.  Saphenous vein graft to the RCA was totally occluded in its proximal portion  with a bullet shaped configuration.  The saphenous vein graft to the obtuse marginal was widely patent. There  was a large valve in the proximal third, but there was excellent flow and an  excellent anastomosis to the distal marginal branch. It filled the distal  marginal branches and its branches well antegrade.  EKG: sinus brady, rate 49 bpm. Incomplete RBBB  Assessment and Plan:   1. CAD: s/p CABG in 1999. Last cath 2005. He has been on a statin. He is not taking a daily ASA or beta blocker. He uses BC powders every day. Recent chest pains. Cannot exclude angina with known severe underlying CAD. Will arrange Lexiscan stress myoview to exclude ischemia.   2. HTN: BP controlled. No changes.   3. HLD: Lipids controlled. Continue statin.   4. Tobacco abuse, ongoing: Smoking cessation advised.

## 2013-10-28 NOTE — Patient Instructions (Signed)
Your physician wants you to follow-up in:  12 months. You will receive a reminder letter in the mail two months in advance. If you don't receive a letter, please call our office to schedule the follow-up appointment.  Your physician has requested that you have a lexiscan myoview. For further information please visit [email or web address]. Please follow instruction sheet, as given.

## 2013-11-02 DIAGNOSIS — M545 Low back pain, unspecified: Secondary | ICD-10-CM | POA: Diagnosis not present

## 2013-11-02 DIAGNOSIS — M5137 Other intervertebral disc degeneration, lumbosacral region: Secondary | ICD-10-CM | POA: Diagnosis not present

## 2013-11-02 DIAGNOSIS — M533 Sacrococcygeal disorders, not elsewhere classified: Secondary | ICD-10-CM | POA: Diagnosis not present

## 2013-11-05 ENCOUNTER — Encounter: Payer: Self-pay | Admitting: Internal Medicine

## 2013-11-05 ENCOUNTER — Telehealth: Payer: Self-pay

## 2013-11-05 DIAGNOSIS — R5383 Other fatigue: Principal | ICD-10-CM

## 2013-11-05 DIAGNOSIS — R5381 Other malaise: Secondary | ICD-10-CM

## 2013-11-05 DIAGNOSIS — Z9181 History of falling: Secondary | ICD-10-CM

## 2013-11-05 NOTE — Telephone Encounter (Signed)
Wheel Chair ordered for this patient.

## 2013-11-09 NOTE — Telephone Encounter (Signed)
Done hardcopy to robin  

## 2013-11-10 ENCOUNTER — Encounter (HOSPITAL_COMMUNITY): Payer: Medicare Other

## 2013-11-10 ENCOUNTER — Telehealth: Payer: Self-pay | Admitting: Internal Medicine

## 2013-11-11 NOTE — Telephone Encounter (Signed)
Pts wife states that he is having burning in his stomach and has lost a lot of weight. Pt scheduled to see Amy Esterwood PA 11/18/13@2 :30pm. Pt aware.

## 2013-11-18 ENCOUNTER — Ambulatory Visit (INDEPENDENT_AMBULATORY_CARE_PROVIDER_SITE_OTHER): Payer: Medicare Other | Admitting: Physician Assistant

## 2013-11-18 ENCOUNTER — Encounter: Payer: Self-pay | Admitting: Physician Assistant

## 2013-11-18 VITALS — BP 108/60 | HR 60 | Ht 65.5 in | Wt 146.4 lb

## 2013-11-18 DIAGNOSIS — I2510 Atherosclerotic heart disease of native coronary artery without angina pectoris: Secondary | ICD-10-CM | POA: Diagnosis not present

## 2013-11-18 DIAGNOSIS — R634 Abnormal weight loss: Secondary | ICD-10-CM | POA: Diagnosis not present

## 2013-11-18 DIAGNOSIS — R1013 Epigastric pain: Secondary | ICD-10-CM

## 2013-11-18 DIAGNOSIS — R1033 Periumbilical pain: Secondary | ICD-10-CM

## 2013-11-18 MED ORDER — OMEPRAZOLE 20 MG PO CPDR: 20.0000 mg | DELAYED_RELEASE_CAPSULE | Freq: Two times a day (BID) | ORAL | Status: DC

## 2013-11-18 NOTE — Progress Notes (Signed)
Subjective:    Patient ID: Tyler Oh., male    DOB: 09-Sep-1940, 73 y.o.   MRN: [id number]  HPI  [name] is a pleasant 73 year old white male known to Dr. Jenny Reichmann. [name]. He comes in today with his wife with complaints of 3-4 month history of epigastric and hypogastric abdominal pain ,decrease in appetite and weight loss of about 29 pounds over the past year. Patient has multiple medical problems including COPD, with nocturnal O2 dependence 2-1/2 L, continued smoking, adult onset diabetes mellitus, peripheral arterial disease, hypertension, and coronary artery disease for which he underwent CABG in 1999 and since has had stents. He had EGD done in February of 2013 found to have a ringlike stricture in the distal esophagus which was balloon dilated and gastritis. He was H. pylori negative. He had colonoscopy in November 2008 - history of hyperplastic polyps ,there were no recurrent polyps- had mild sigmoid diverticulosis.   Labs done in July 2015, hemoglobin 12.8 hematocrit of 38.3 WBC of 6.1 platelets 213. Review of medications, patient has been taking Prilosec 20 mg daily chronically. Unfortunately he also takes at least 4 packages of [facility] powders every day for chronic headaches. He states" I think I'm addicted to them". Denies other aspirin or NSAIDs.    Review of Systems  Constitutional: Positive for appetite change and unexpected weight change.  HENT: Negative.   Eyes: Negative.   Respiratory: Positive for shortness of breath.   Cardiovascular: Negative.   Gastrointestinal: Positive for nausea and abdominal pain.  Endocrine: Negative.   Genitourinary: Negative.   Musculoskeletal: Positive for arthralgias and back pain.  Skin: Negative.   Allergic/Immunologic: Negative.   Neurological: Negative.   Hematological: Negative.   Psychiatric/Behavioral: Negative.    Outpatient Prescriptions Prior to Visit  Medication Sig Dispense Refill  . ALPRAZolam (XANAX) 0.25 MG tablet take 1  tablet by mouth three times a day if needed  90 tablet  2  . AMBULATORY NON FORMULARY MEDICATION 2.5 Liters home oxygen continuous      . Aspirin-Salicylamide-Caffeine (BC HEADACHE POWDER PO) Take 1 Package by mouth 4 (four) times daily as needed (headache or pain).      Marland Kitchen docusate sodium (COLACE) 100 MG capsule Take 200 mg by mouth daily.       Marland Kitchen donepezil (ARICEPT) 5 MG tablet take 1 tablet by mouth at bedtime  30 tablet  6  . DULoxetine (CYMBALTA) 60 MG capsule take 1 capsule by mouth once daily  90 capsule  2  . fluticasone (FLONASE) 50 MCG/ACT nasal spray instill 2 sprays into each nostril once daily  16 g  11  . glipiZIDE (GLUCOTROL XL) 2.5 MG 24 hr tablet Take 2.5 mg by mouth daily. Take only if blood sugar is greater than 180      . metFORMIN (GLUCOPHAGE) 500 MG tablet take 2 tablets by mouth twice a day  360 tablet  3  . Multiple Vitamin (MULTIVITAMIN) capsule Take 1 capsule by mouth daily.       . nitroGLYCERIN (NITROSTAT) 0.4 MG SL tablet Place 1 tablet (0.4 mg total) under the tongue every 5 (five) minutes as needed. For chest pain  25 tablet  6  . OxyCODONE (OXYCONTIN) 40 mg T12A 12 hr tablet Take 40 mg by mouth 3 (three) times daily.      Marland Kitchen oxycodone (ROXICODONE) 30 MG immediate release tablet Take 30 mg by mouth every 4 (four) hours as needed for pain.      . pravastatin (  PRAVACHOL) 80 MG tablet Take 1 tablet (80 mg total) by mouth daily.  90 tablet  3  . PROAIR HFA 108 (90 BASE) MCG/ACT inhaler inhale 2 puffs by mouth four times a day if needed  8.5 g  2  . zolpidem (AMBIEN) 10 MG tablet Take 10 mg by mouth at bedtime as needed. For sleep      . omeprazole (PRILOSEC) 20 MG capsule Take 20 mg by mouth daily.       No facility-administered medications prior to visit.   Allergies  Allergen Reactions  . Celecoxib Nausea And Vomiting    REACTION: GI upset  . Chicken Protein Other (See Comments)    Will not eat chicken  . Fish-Derived Products Other (See Comments)    Will not eat  seafood of any kind  . Morphine Itching    REACTION: itching  . Nalbuphine Other (See Comments)    REACTION: contraindication with oxycontin.  . Prednisone Other (See Comments)    REACTION: "yeast infections"  . Venlafaxine Other (See Comments)    REACTION: GI upset   Patient Active Problem List   Diagnosis Date Noted  . Acute low back pain 08/18/2013  . Smoker 12/03/2012  . Chest pain, atypical 10/17/2012  . Chronic respiratory failure with hypoxia 10/17/2012  . On home oxygen therapy 10/17/2012  . Hyperkalemia 10/17/2012  . Headache 02/20/2012  . Pneumonia involving left lung 12/18/2011  . Weight loss 04/17/2011  . Bladder neck obstruction 01/17/2011  . COPD (chronic obstructive pulmonary disease) 11/17/2010  . Preventative health care 11/17/2010  . Rhinitis 11/17/2010  . CONSTIPATION 06/12/2010  . FLATULENCE-GAS-BLOATING 06/12/2010  . Abdominal pain, epigastric 06/12/2010  . SCIATICA, LEFT 05/31/2010  . PHIMOSIS 04/06/2009  . TOBACCO USER 01/17/2009  . DYSPHAGIA 07/25/2008  . ASTHMA 06/24/2008  . OSTEOARTHRITIS 06/24/2008  . ANEMIA-IRON DEFICIENCY 05/20/2008  . INSOMNIA-SLEEP DISORDER-UNSPEC 05/18/2008  . Memory loss 05/18/2008  . DEGENERATIVE DISC DISEASE, LUMBAR SPINE 01/03/2008  . EMPHYSEMA 09/07/2007  . OBSTRUCTIVE SLEEP APNEA 07/03/2007  . ANXIETY 06/24/2007  . DEPRESSION 06/24/2007  . BENIGN PROSTATIC HYPERTROPHY 06/24/2007  . HYPERSOMNIA 06/24/2007  . FATIGUE 06/24/2007  . Urinary hesitancy 06/24/2007  . COLONIC POLYPS 06/12/2007  . Type II or unspecified type diabetes mellitus with peripheral circulatory disorders, not stated as uncontrolled(250.70) 06/12/2007  . HYPERLIPIDEMIA 06/12/2007  . HYPERTENSION 06/12/2007  . ANGINA PECTORIS 06/12/2007  . CAD 06/12/2007  . PERIPHERAL VASCULAR DISEASE 06/12/2007  . ESOPHAGEAL STRICTURE 06/12/2007  . GERD 06/12/2007  . DIVERTICULOSIS, COLON 06/12/2007  . HERNIATED DISC 06/12/2007  . SPINAL STENOSIS 06/12/2007   . Lumbago 06/12/2007   History  Substance Use Topics  . Smoking status: Current Every Day Smoker -- 1.00 packs/day for 40 years    Types: Cigarettes  . Smokeless tobacco: Never Used  . Alcohol Use: No   family history includes Allergies in an other family member; Diabetes in an other family member; Emphysema in his father; Heart disease in an other family member; Hyperlipidemia in an other family member; Hypertension in an other family member; Kidney cancer in his daughter; Stroke in his father. There is no history of Colon cancer.     Objective:   Physical Exam  well-developed older white male in no acute distress, blood pressure 108/60 pulse 60 height 5 foot 5 weight 146. HEENT; nontraumatic normocephalic EOMI PERRLA sclera anicteric, Supple ,no JVD, Cardiovascular; regular rate and rhythm with S1-S2 his a sternal incisional scar, Pulmonary diffuse rhonchi bilaterally, Abdomen; soft and is  in minimal tenderness in the hypogastrium there is no guarding or rebound no palpable mass or hepatosplenomegaly bowel sounds are present, Rectal; exam not done, Extremities; no clubbing cyanosis or edema skin warm and dry, Psych ;mood and affect appropriate.        Assessment & Plan:  #80  73 year old white male with 3-4 month history of epigastric/hypogastric abdominal pain, nausea and weight loss. Differential includes BC powder-induced peptic ulcer disease or gastropathy versus underlying neoplasm #2 coronary artery disease status post CABG and remote stents-no anticoagulation #3 COPD with nocturnal O2 dependence #4 hypertension #5 diabetes mellitus #6 peripheral arterial disease #7 GERD with prior dilation of distal esophageal ringlike stricture  Plan; increase omeprazole to 20 mg by mouth twice daily Patient was asked to decrease Goody powder use and try to discontinue altogether Schedule for upper endoscopy with Dr. Henrene Gardner -this will need to be done at the hospital given his COPD with oxygen  use. Procedure discussed in detail with patient and his wife and they're agreeable to proceed Start protein supplement like Boost or Ensure plus high-protein twice daily

## 2013-11-18 NOTE — Patient Instructions (Addendum)
You have been given a separate informational sheet regarding your tobacco use, the importance of quitting and local resources to help you quit.  You have been scheduled for an endoscopy. Please follow written instructions given to you at your visit today. If you use inhalers (even only as needed), please bring them with you on the day of your procedure.  Increase your Prilosec to 20 mg one tablet by mouth twice daily. A new prescription has been sent to your pharmacy.  Decrease the amount of BC powder you take.   Take Boost plus or high protein daily.   You have been scheduled for a CT scan of the abdomen and pelvis at [facility] ([id number]---this is in the same building as [facility]).   You are scheduled on 11/24/13 at 10:00am. You should arrive 15 minutes prior to your appointment time for registration. Please follow the written instructions below on the day of your exam:  WARNING: IF YOU ARE ALLERGIC TO IODINE/X-RAY DYE, PLEASE NOTIFY RADIOLOGY IMMEDIATELY AT [phone]! YOU WILL BE GIVEN A 13 HOUR PREMEDICATION PREP.  1) Do not eat or drink anything after 6:00am (4 hours prior to your test) 2) You have been given 2 bottles of oral contrast to drink. The solution may taste better if refrigerated, but do NOT add ice or any other liquid to this solution. Shake well before drinking.    Drink 1 bottle of contrast @ 8:00am (2 hours prior to your exam)  Drink 1 bottle of contrast @ 9:00am (1 hour prior to your exam)  You may take any medications as prescribed with a small amount of water except for the following: Metformin, Glucophage, Glucovance, Avandamet, Riomet, Fortamet, Actoplus Met, Janumet, Glumetza or Metaglip. The above medications must be held the day of the exam AND 48 hours after the exam.  The purpose of you drinking the oral contrast is to aid in the visualization of your intestinal tract. The contrast solution may cause some diarrhea. Before your  exam is started, you will be given a small amount of fluid to drink. Depending on your individual set of symptoms, you may also receive an intravenous injection of x-ray contrast/dye. Plan on being at [facility] for 30 minutes or long, depending on the type of exam you are having performed.  This test typically takes 30-45 minutes to complete.  If you have any questions regarding your exam or if you need to reschedule, you may call the CT department at [phone] between the hours of 8:00 am and 5:00 pm, Monday-Friday.  ________________________________________________________________________

## 2013-11-23 ENCOUNTER — Other Ambulatory Visit: Payer: Self-pay

## 2013-11-23 DIAGNOSIS — R109 Unspecified abdominal pain: Secondary | ICD-10-CM

## 2013-11-23 NOTE — Progress Notes (Signed)
Pts EGD cancelled at [facility]. Pt scheduled for Upper GI at [facility] 12/01/13, pt to arrive there at 9:45am for a 10am appt. Pt to be NPO after 7am. Pts wife aware of appt.

## 2013-11-23 NOTE — Progress Notes (Signed)
Agree with initial assessment. However, upper endoscopy likely to be low yield. Would prefer upper GI series to avoid the risk of sedation. If upper GI series is negative, then contrast-enhanced CT scan with fine cuts through the pancreas and GI followup thereafter. Vaughan Basta, let patient know that I want to perform upper GI series "abdominal pain and weight loss" and cancel hospital EGD as above. Thank you

## 2013-11-24 ENCOUNTER — Ambulatory Visit (INDEPENDENT_AMBULATORY_CARE_PROVIDER_SITE_OTHER)
Admission: RE | Admit: 2013-11-24 | Discharge: 2013-11-24 | Disposition: A | Discharge status: Home / Self Care | Payer: Medicare Other | Source: Ambulatory Visit | Attending: Physician Assistant | Admitting: Physician Assistant

## 2013-11-24 ENCOUNTER — Other Ambulatory Visit: Payer: Self-pay | Admitting: Internal Medicine

## 2013-11-24 DIAGNOSIS — K449 Diaphragmatic hernia without obstruction or gangrene: Secondary | ICD-10-CM | POA: Diagnosis not present

## 2013-11-24 DIAGNOSIS — R1013 Epigastric pain: Secondary | ICD-10-CM

## 2013-11-24 DIAGNOSIS — R1033 Periumbilical pain: Secondary | ICD-10-CM

## 2013-11-24 DIAGNOSIS — R634 Abnormal weight loss: Secondary | ICD-10-CM | POA: Diagnosis not present

## 2013-11-24 MED ORDER — IOHEXOL 300 MG/ML  SOLN
100.0000 mL | Freq: Once | INTRAMUSCULAR | Status: AC | PRN
  Administered 2013-11-24: 100 mL via INTRAVENOUS

## 2013-11-25 ENCOUNTER — Telehealth: Payer: Self-pay | Admitting: Physician Assistant

## 2013-11-25 NOTE — Telephone Encounter (Signed)
After I have reviewed, I will have Vaughan Basta call patient with my recommendations, thanks

## 2013-11-25 NOTE — Telephone Encounter (Signed)
Patient is asking for CT scan results. Amy Esterwood, PA is out of office this week. Patient's primary GI is Dr. Henrene Pastor. Please, advise.

## 2013-11-30 DIAGNOSIS — M533 Sacrococcygeal disorders, not elsewhere classified: Secondary | ICD-10-CM | POA: Diagnosis not present

## 2013-11-30 DIAGNOSIS — M542 Cervicalgia: Secondary | ICD-10-CM | POA: Diagnosis not present

## 2013-11-30 DIAGNOSIS — Z79899 Other long term (current) drug therapy: Secondary | ICD-10-CM | POA: Diagnosis not present

## 2013-11-30 DIAGNOSIS — M5137 Other intervertebral disc degeneration, lumbosacral region: Secondary | ICD-10-CM | POA: Diagnosis not present

## 2013-11-30 DIAGNOSIS — M545 Low back pain, unspecified: Secondary | ICD-10-CM | POA: Diagnosis not present

## 2013-11-30 DIAGNOSIS — G894 Chronic pain syndrome: Secondary | ICD-10-CM | POA: Diagnosis not present

## 2013-11-30 DIAGNOSIS — M5430 Sciatica, unspecified side: Secondary | ICD-10-CM | POA: Diagnosis not present

## 2013-12-01 ENCOUNTER — Encounter: Payer: Self-pay | Admitting: Internal Medicine

## 2013-12-01 ENCOUNTER — Ambulatory Visit (HOSPITAL_COMMUNITY)
Admission: RE | Admit: 2013-12-01 | Discharge: 2013-12-01 | Disposition: A | Discharge status: Home / Self Care | Payer: Medicare Other | Source: Ambulatory Visit | Attending: Internal Medicine | Admitting: Internal Medicine

## 2013-12-01 DIAGNOSIS — R109 Unspecified abdominal pain: Secondary | ICD-10-CM

## 2013-12-01 DIAGNOSIS — K219 Gastro-esophageal reflux disease without esophagitis: Secondary | ICD-10-CM | POA: Insufficient documentation

## 2013-12-01 DIAGNOSIS — K449 Diaphragmatic hernia without obstruction or gangrene: Secondary | ICD-10-CM | POA: Insufficient documentation

## 2013-12-02 DIAGNOSIS — F331 Major depressive disorder, recurrent, moderate: Secondary | ICD-10-CM | POA: Diagnosis not present

## 2013-12-02 DIAGNOSIS — F0390 Unspecified dementia without behavioral disturbance: Secondary | ICD-10-CM | POA: Diagnosis not present

## 2013-12-02 DIAGNOSIS — F411 Generalized anxiety disorder: Secondary | ICD-10-CM | POA: Diagnosis not present

## 2013-12-02 DIAGNOSIS — F0393 Unspecified dementia, unspecified severity, with mood disturbance: Secondary | ICD-10-CM | POA: Diagnosis not present

## 2013-12-03 ENCOUNTER — Other Ambulatory Visit (INDEPENDENT_AMBULATORY_CARE_PROVIDER_SITE_OTHER): Payer: Medicare Other

## 2013-12-03 ENCOUNTER — Encounter: Payer: Self-pay | Admitting: Internal Medicine

## 2013-12-03 ENCOUNTER — Ambulatory Visit (INDEPENDENT_AMBULATORY_CARE_PROVIDER_SITE_OTHER): Payer: Medicare Other | Admitting: Internal Medicine

## 2013-12-03 VITALS — BP 110/72 | HR 100 | Temp 97.9°F | Wt 144.5 lb

## 2013-12-03 DIAGNOSIS — Z0000 Encounter for general adult medical examination without abnormal findings: Secondary | ICD-10-CM

## 2013-12-03 DIAGNOSIS — Z125 Encounter for screening for malignant neoplasm of prostate: Secondary | ICD-10-CM

## 2013-12-03 DIAGNOSIS — I2510 Atherosclerotic heart disease of native coronary artery without angina pectoris: Secondary | ICD-10-CM | POA: Diagnosis not present

## 2013-12-03 DIAGNOSIS — E119 Type 2 diabetes mellitus without complications: Secondary | ICD-10-CM

## 2013-12-03 DIAGNOSIS — Z23 Encounter for immunization: Secondary | ICD-10-CM | POA: Diagnosis not present

## 2013-12-03 DIAGNOSIS — E785 Hyperlipidemia, unspecified: Secondary | ICD-10-CM | POA: Diagnosis not present

## 2013-12-03 DIAGNOSIS — I10 Essential (primary) hypertension: Secondary | ICD-10-CM

## 2013-12-03 DIAGNOSIS — R634 Abnormal weight loss: Secondary | ICD-10-CM | POA: Diagnosis not present

## 2013-12-03 DIAGNOSIS — E1159 Type 2 diabetes mellitus with other circulatory complications: Secondary | ICD-10-CM

## 2013-12-03 LAB — BASIC METABOLIC PANEL
BUN: 31 mg/dL — ABNORMAL HIGH (ref 6–23)
CO2: 28 meq/L (ref 19–32)
Calcium: 9.3 mg/dL (ref 8.4–10.5)
Chloride: 101 mEq/L (ref 96–112)
Creatinine, Ser: 1.2 mg/dL (ref 0.4–1.5)
GFR: 61.11 mL/min (ref >60.00)
GLUCOSE: 90 mg/dL (ref 70–99)
POTASSIUM: 4.9 meq/L (ref 3.5–5.1)
Sodium: 139 mEq/L (ref 135–145)

## 2013-12-03 LAB — CBC WITH DIFFERENTIAL/PLATELET
BASOS ABS: 0 10*3/uL (ref 0.0–0.1)
Basophils Relative: 0.5 % (ref 0.0–3.0)
EOS ABS: 0.3 10*3/uL (ref 0.0–0.7)
Eosinophils Relative: 3.3 % (ref 0.0–5.0)
HCT: 42.2 % (ref 39.0–52.0)
Hemoglobin: 13.7 g/dL (ref 13.0–17.0)
Lymphocytes Relative: 21.8 % (ref 12.0–46.0)
Lymphs Abs: 1.9 10*3/uL (ref 0.7–4.0)
MCHC: 32.5 g/dL (ref 30.0–36.0)
MCV: 97.4 fl (ref 78.0–100.0)
MONO ABS: 0.8 10*3/uL (ref 0.1–1.0)
MONOS PCT: 8.8 % (ref 3.0–12.0)
NEUTROS ABS: 5.8 10*3/uL (ref 1.4–7.7)
Neutrophils Relative %: 65.6 % (ref 43.0–77.0)
PLATELETS: 241 10*3/uL (ref 150.0–400.0)
RBC: 4.33 Mil/uL (ref 4.22–5.81)
RDW: 16.4 % — AB (ref 11.5–15.5)
WBC: 8.8 10*3/uL (ref 4.0–10.5)

## 2013-12-03 LAB — HEPATIC FUNCTION PANEL
ALBUMIN: 3.6 g/dL (ref 3.5–5.2)
ALT: 11 U/L (ref 0–53)
AST: 19 U/L (ref 0–37)
Alkaline Phosphatase: 79 U/L (ref 39–117)
BILIRUBIN DIRECT: 0.1 mg/dL (ref 0.0–0.3)
Total Bilirubin: 0.4 mg/dL (ref 0.2–1.2)
Total Protein: 7 g/dL (ref 6.0–8.3)

## 2013-12-03 LAB — LIPID PANEL
CHOLESTEROL: 134 mg/dL (ref 0–200)
HDL: 43.1 mg/dL (ref >39.00)
LDL Cholesterol: 65 mg/dL (ref 0–99)
NONHDL: 90.9
Total CHOL/HDL Ratio: 3
Triglycerides: 128 mg/dL (ref 0.0–149.0)
VLDL: 25.6 mg/dL (ref 0.0–40.0)

## 2013-12-03 LAB — PSA: PSA: 0.1 ng/mL (ref 0.10–4.00)

## 2013-12-03 LAB — TSH: TSH: 1.19 u[IU]/mL (ref 0.35–4.50)

## 2013-12-03 NOTE — Progress Notes (Signed)
Pre visit review using our clinic review tool, if applicable. No additional management support is needed unless otherwise documented below in the visit note. 

## 2013-12-03 NOTE — Patient Instructions (Addendum)

## 2013-12-03 NOTE — Progress Notes (Signed)
Subjective:    Patient ID: Tyler Oh., male    DOB: [birth year], 73 y.o.   MRN: [id number]  HPI   Here for wellness and f/u; hx limited by dementia but accompanied by wife who assits,  Overall doing ok;  Pt denies CP, worsening SOB, DOE over baseline recently, increased wheezing, orthopnea, PND, worsening LE edema, palpitations, dizziness or syncope.  Pt denies neurological change such as new headache, facial or extremity weakness.  Pt denies polydipsia, polyuria, or low sugar symptoms. Pt states overall good compliance with treatment and medications, good tolerability, and has been trying to follow lower cholesterol diet.  Pt denies recent worsening depressive symptoms, suicidal ideation or panic. No fever, night sweats, wt loss, loss of appetite, or other constitutional symptoms.  Pt states fair ability with ADL's, has low to mod fall risk, home safety reviewed and adequate, no other significant changes in hearing or vision recent, and not really active with exercise. Still smoking. Concerned about wt loss. No current other specific complaints  Due for flu shot Wt Readings from Last 3 Encounters:  12/03/13 144 lb 8 oz (65.545 kg)  11/18/13 146 lb 6 oz (66.395 kg)  10/28/13 150 lb (68.04 kg)   Cont's to see psychiatry in [place] Past Medical History  Diagnosis Date  . COPD (chronic obstructive pulmonary disease)   . Allergy   . Diabetes mellitus   . Emphysema of lung   . GERD (gastroesophageal reflux disease)   . Hypertension   . Hyperlipidemia   . CAD (coronary artery disease)     a. s/p multiple PCIs to RCA and eventual CABG in 1999;  b.LHC 11/2003: CFX 95%, RCA occluded, SVG-RCA occluded, SVG-OM patent, native LAD patent. EF was 55%.  Patient had left to right collaterals and medical therapy was recommended    . Anxiety   . PVD (peripheral vascular disease)   . OA (osteoarthritis)   . Asthma   . Iron deficiency anemia   . Memory loss   . Insomnia   . DDD (degenerative  disc disease), lumbar   . OSA (obstructive sleep apnea)   . Depression   . Benign prostatic hypertrophy   . Lumbar back pain     chronic  . Spinal stenosis   . Herniated disc   . History of colonic polyps   . Diverticulitis, colon   . Esophageal stricture   . Hx of colonoscopy   . Hiatal hernia   . HOH (hard of hearing)    Past Surgical History  Procedure Laterality Date  . Heart bypass    . Bilateral knee replacements    . Hemorrhoid surgery    . Carpal tunnel release    . Shoulder arthroscopy  right  . Coronary artery bypass graft  09/1997    double bypss  . Coronary angioplasty with stent placement  2/99  . Strabismus surgery  08/29/2011    Procedure: REPAIR STRABISMUS;  Surgeon: Derry Skill, MD;  Location: [facility];  Service: Ophthalmology;  Laterality: Left;    reports that he has been smoking Cigarettes.  He has a 40 pack-year smoking history. He has never used smokeless tobacco. He reports that he does not drink alcohol or use illicit drugs. family history includes Allergies in an other family member; Diabetes in an other family member; Emphysema in his father; Heart disease in an other family member; Hyperlipidemia in an other family member; Hypertension in an other family member; Kidney cancer in his daughter;  Stroke in his father. There is no history of Colon cancer. Allergies  Allergen Reactions  . Celecoxib Nausea And Vomiting    REACTION: GI upset  . Chicken Protein Other (See Comments)    Will not eat chicken  . Fish-Derived Products Other (See Comments)    Will not eat seafood of any kind  . Morphine Itching    REACTION: itching  . Nalbuphine Other (See Comments)    REACTION: contraindication with oxycontin.  . Prednisone Other (See Comments)    REACTION: "yeast infections"  . Venlafaxine Other (See Comments)    REACTION: GI upset   Current Outpatient Prescriptions on File Prior to Visit  Medication Sig Dispense Refill  . ALPRAZolam (XANAX) 0.25 MG  tablet take 1 tablet by mouth three times a day if needed  90 tablet  2  . AMBULATORY NON FORMULARY MEDICATION 2.5 Liters home oxygen continuous      . Aspirin-Salicylamide-Caffeine (BC HEADACHE POWDER PO) Take 1 Package by mouth 4 (four) times daily as needed (headache or pain).      Marland Kitchen docusate sodium (COLACE) 100 MG capsule Take 200 mg by mouth daily.       Marland Kitchen donepezil (ARICEPT) 5 MG tablet take 1 tablet by mouth at bedtime  30 tablet  6  . DULoxetine (CYMBALTA) 30 MG capsule Take 30 mg by mouth daily. Pt takes 90 mg daily      . DULoxetine (CYMBALTA) 60 MG capsule take 1 capsule by mouth once daily  90 capsule  2  . fluticasone (FLONASE) 50 MCG/ACT nasal spray instill 2 sprays into each nostril once daily  16 g  11  . glipiZIDE (GLUCOTROL XL) 2.5 MG 24 hr tablet Take 2.5 mg by mouth daily. Take only if blood sugar is greater than 180      . metFORMIN (GLUCOPHAGE) 500 MG tablet take 2 tablets by mouth twice a day  360 tablet  3  . Multiple Vitamin (MULTIVITAMIN) capsule Take 1 capsule by mouth daily.       . nitroGLYCERIN (NITROSTAT) 0.4 MG SL tablet Place 1 tablet (0.4 mg total) under the tongue every 5 (five) minutes as needed. For chest pain  25 tablet  6  . omeprazole (PRILOSEC) 20 MG capsule Take 1 capsule (20 mg total) by mouth 2 (two) times daily before a meal.  60 capsule  11  . OxyCODONE (OXYCONTIN) 40 mg T12A 12 hr tablet Take 40 mg by mouth 3 (three) times daily.      Marland Kitchen oxycodone (ROXICODONE) 30 MG immediate release tablet Take 30 mg by mouth every 4 (four) hours as needed for pain.      . pravastatin (PRAVACHOL) 80 MG tablet Take 1 tablet (80 mg total) by mouth daily.  90 tablet  3  . PROAIR HFA 108 (90 BASE) MCG/ACT inhaler inhale 1 to 2 puffs four times a day if needed  8.5 g  11  . zolpidem (AMBIEN) 10 MG tablet Take 10 mg by mouth at bedtime as needed. For sleep       No current facility-administered medications on file prior to visit.   Review of Systems  Constitutional:  Negative for increased diaphoresis, other activity, appetite or other siginficant weight change  HENT: Negative for worsening hearing loss, ear pain, facial swelling, mouth sores and neck stiffness.   Eyes: Negative for other worsening pain, redness or visual disturbance.  Respiratory: Negative for shortness of breath and wheezing.   Cardiovascular: Negative for chest pain and  palpitations.  Gastrointestinal: Negative for diarrhea, blood in stool, abdominal distention or other pain Genitourinary: Negative for hematuria, flank pain or change in urine volume.  Musculoskeletal: Negative for myalgias or other joint complaints.  Skin: Negative for color change and wound.  Neurological: Negative for syncope and numbness. other than noted Hematological: Negative for adenopathy. or other swelling Psychiatric/Behavioral: Negative for hallucinations, self-injury, decreased concentration or other worsening agitation.      Objective:   Physical Exam BP 110/72  Pulse 100  Temp(Src) 97.9 F (36.6 C) (Oral)  Wt 144 lb 8 oz (65.545 kg)  SpO2 91% VS noted, on home o2 Constitutional: Pt appears to have lose wt since last seen HENT: Head: NCAT.  Right Ear: External ear normal.  Left Ear: External ear normal.  Eyes: . Pupils are equal, round, and reactive to light. Conjunctivae and EOM are normal Neck: Normal range of motion. Neck supple.  Cardiovascular: Normal rate and regular rhythm.   Pulmonary/Chest: Effort normal and breath sounds decreased, no rales or wheezing.  Abd:  Soft, NT, ND, + BS Neurological: Pt is alert. At baseline confused/demented , motor grossly intact, walks with cane Skin: Skin is warm. No rash Psychiatric: Pt behavior is normal. No agitation.     Assessment & Plan:

## 2013-12-06 DIAGNOSIS — H526 Other disorders of refraction: Secondary | ICD-10-CM | POA: Diagnosis not present

## 2013-12-06 DIAGNOSIS — H02839 Dermatochalasis of unspecified eye, unspecified eyelid: Secondary | ICD-10-CM | POA: Diagnosis not present

## 2013-12-06 DIAGNOSIS — E119 Type 2 diabetes mellitus without complications: Secondary | ICD-10-CM | POA: Diagnosis not present

## 2013-12-06 DIAGNOSIS — H353190 Nonexudative age-related macular degeneration, unspecified eye, stage unspecified: Secondary | ICD-10-CM | POA: Diagnosis not present

## 2013-12-06 DIAGNOSIS — H2589 Other age-related cataract: Secondary | ICD-10-CM | POA: Diagnosis not present

## 2013-12-06 DIAGNOSIS — H5005 Alternating esotropia: Secondary | ICD-10-CM | POA: Diagnosis not present

## 2013-12-06 LAB — URINALYSIS, ROUTINE W REFLEX MICROSCOPIC
BILIRUBIN URINE: NEGATIVE
KETONES UR: NEGATIVE
LEUKOCYTES UA: NEGATIVE
NITRITE: NEGATIVE
Specific Gravity, Urine: 1.02 (ref 1.000–1.030)
Total Protein, Urine: NEGATIVE
UROBILINOGEN UA: 0.2 (ref 0.0–1.0)
Urine Glucose: NEGATIVE
WBC, UA: NONE SEEN (ref 0–?)
pH: 5.5 (ref 5.0–8.0)

## 2013-12-06 NOTE — Assessment & Plan Note (Signed)
stable overall by history and exam, recent data reviewed with pt, and pt to continue medical treatment as before,  to f/u any worsening symptoms or concerns Lab Results  Component Value Date   LDLCALC 65 12/03/2013

## 2013-12-06 NOTE — Assessment & Plan Note (Addendum)
Etiology unclear, has seen GI with egd planned, has severe lung dz and dementa liekly related, for SPEP as well

## 2013-12-06 NOTE — Assessment & Plan Note (Signed)
stable overall by history and exam, recent data reviewed with pt, and pt to continue medical treatment as before,  to f/u any worsening symptoms or concerns Lab Results  Component Value Date   HGBA1C 6.9* 10/15/2013

## 2013-12-06 NOTE — Assessment & Plan Note (Signed)
stable overall by history and exam, recent data reviewed with pt, and pt to continue medical treatment as before,  to f/u any worsening symptoms or concerns BP Readings from Last 3 Encounters:  12/03/13 110/72  11/18/13 108/60  10/28/13 119/71

## 2013-12-06 NOTE — Assessment & Plan Note (Signed)

## 2013-12-07 LAB — PROTEIN ELECTROPHORESIS, SERUM
ALPHA-1-GLOBULIN: 4.3 % (ref 2.9–4.9)
ALPHA-2-GLOBULIN: 14.1 % — AB (ref 7.1–11.8)
Albumin ELP: 57.8 % (ref 55.8–66.1)
BETA 2: 5.1 % (ref 3.2–6.5)
Beta Globulin: 6.7 % (ref 4.7–7.2)
GAMMA GLOBULIN: 12 % (ref 11.1–18.8)
Total Protein, Serum Electrophoresis: 6.5 g/dL (ref 6.0–8.3)

## 2013-12-15 ENCOUNTER — Encounter (HOSPITAL_COMMUNITY): Admission: RE | Payer: Self-pay | Source: Ambulatory Visit

## 2013-12-15 ENCOUNTER — Ambulatory Visit (HOSPITAL_COMMUNITY): Admission: RE | Admit: 2013-12-15 | Payer: Medicare Other | Source: Ambulatory Visit | Admitting: Internal Medicine

## 2013-12-15 SURGERY — EGD (ESOPHAGOGASTRODUODENOSCOPY)
Anesthesia: Moderate Sedation

## 2013-12-28 DIAGNOSIS — M542 Cervicalgia: Secondary | ICD-10-CM | POA: Diagnosis not present

## 2013-12-28 DIAGNOSIS — M5430 Sciatica, unspecified side: Secondary | ICD-10-CM | POA: Diagnosis not present

## 2013-12-28 DIAGNOSIS — G540 Brachial plexus disorders: Secondary | ICD-10-CM | POA: Diagnosis not present

## 2014-01-01 ENCOUNTER — Encounter: Payer: Self-pay | Admitting: Internal Medicine

## 2014-01-15 ENCOUNTER — Other Ambulatory Visit: Payer: Self-pay | Admitting: Internal Medicine

## 2014-01-18 NOTE — Telephone Encounter (Signed)
Faxed script back to [facility]...lmb 

## 2014-01-18 NOTE — Telephone Encounter (Signed)
Done hardcopy to robin  

## 2014-01-27 DIAGNOSIS — G603 Idiopathic progressive neuropathy: Secondary | ICD-10-CM | POA: Diagnosis not present

## 2014-01-27 DIAGNOSIS — G8929 Other chronic pain: Secondary | ICD-10-CM | POA: Diagnosis not present

## 2014-01-27 DIAGNOSIS — Z79891 Long term (current) use of opiate analgesic: Secondary | ICD-10-CM | POA: Diagnosis not present

## 2014-02-01 ENCOUNTER — Ambulatory Visit: Payer: Medicare Other | Admitting: Internal Medicine

## 2014-02-01 ENCOUNTER — Encounter: Payer: Self-pay | Admitting: Internal Medicine

## 2014-02-01 ENCOUNTER — Ambulatory Visit (INDEPENDENT_AMBULATORY_CARE_PROVIDER_SITE_OTHER): Payer: Medicare Other | Admitting: Internal Medicine

## 2014-02-01 VITALS — BP 112/72 | HR 65 | Temp 97.3°F | Wt 142.0 lb

## 2014-02-01 DIAGNOSIS — I10 Essential (primary) hypertension: Secondary | ICD-10-CM | POA: Diagnosis not present

## 2014-02-01 DIAGNOSIS — I2510 Atherosclerotic heart disease of native coronary artery without angina pectoris: Secondary | ICD-10-CM | POA: Diagnosis not present

## 2014-02-01 DIAGNOSIS — J438 Other emphysema: Secondary | ICD-10-CM | POA: Diagnosis not present

## 2014-02-01 DIAGNOSIS — R3911 Hesitancy of micturition: Secondary | ICD-10-CM

## 2014-02-01 DIAGNOSIS — R300 Dysuria: Secondary | ICD-10-CM | POA: Diagnosis not present

## 2014-02-01 MED ORDER — TAMSULOSIN HCL 0.4 MG PO CAPS: 0.4000 mg | ORAL_CAPSULE | Freq: Every day | ORAL | Status: DC

## 2014-02-01 MED ORDER — DOXYCYCLINE HYCLATE 100 MG PO TABS
100.0000 mg | ORAL_TABLET | Freq: Two times a day (BID) | ORAL | Status: DC
Start: 2014-02-01 — End: 2014-04-07

## 2014-02-01 NOTE — Patient Instructions (Signed)
Please take all new medication as prescribed - the one month of antibiotic, as well as the generic Flomax  OK to stop both after one month, but to re-start the flomax if the symptoms return  Please call if overall not improved in 2 weeks, for Urology referral  Please continue all other medications as before  Please have the pharmacy call with any other refills you may need.  Please keep your appointments with your specialists as you may have planned

## 2014-02-01 NOTE — Progress Notes (Signed)
Subjective:    Patient ID: Tyler Oh., male    DOB: 1941-01-11, 73 y.o.   MRN: [id number]  HPI   Here to f/u, hx difficult due to dementia, and unable to give urinary sample at this time.  C/o 2-3 wks onset urinary burning intermittent but without frequency,  flank pain, hematuria or n/v, fever, chills, but has urgency, slowed stream, hesitancy to start, and some sensation of unable to void compeletly, sometimes start and stop with urination.  Had prior similar episode tx as prostatitis with success resolution of symptoms.  Has hx of BPH, declines urology referral at this time. Pt denies chest pain, increased sob or doe, wheezing, orthopnea, PND, increased LE swelling, palpitations, dizziness or syncope.  Remains on home o2. Past Medical History  Diagnosis Date  . COPD (chronic obstructive pulmonary disease)   . Allergy   . Diabetes mellitus   . Emphysema of lung   . GERD (gastroesophageal reflux disease)   . Hypertension   . Hyperlipidemia   . CAD (coronary artery disease)     a. s/p multiple PCIs to RCA and eventual CABG in 1999;  b.LHC 11/2003: CFX 95%, RCA occluded, SVG-RCA occluded, SVG-OM patent, native LAD patent. EF was 55%.  Patient had left to right collaterals and medical therapy was recommended    . Anxiety   . PVD (peripheral vascular disease)   . OA (osteoarthritis)   . Asthma   . Iron deficiency anemia   . Memory loss   . Insomnia   . DDD (degenerative disc disease), lumbar   . OSA (obstructive sleep apnea)   . Depression   . Benign prostatic hypertrophy   . Lumbar back pain     chronic  . Spinal stenosis   . Herniated disc   . History of colonic polyps   . Diverticulitis, colon   . Esophageal stricture   . Hx of colonoscopy   . Hiatal hernia   . HOH (hard of hearing)    Past Surgical History  Procedure Laterality Date  . Heart bypass    . Bilateral knee replacements    . Hemorrhoid surgery    . Carpal tunnel release    . Shoulder arthroscopy   right  . Coronary artery bypass graft  09/1997    double bypss  . Coronary angioplasty with stent placement  2/99  . Strabismus surgery  08/29/2011    Procedure: REPAIR STRABISMUS;  Surgeon: Derry Skill, MD;  Location: [facility];  Service: Ophthalmology;  Laterality: Left;    reports that he has been smoking Cigarettes.  He has a 40 pack-year smoking history. He has never used smokeless tobacco. He reports that he does not drink alcohol or use illicit drugs. family history includes Allergies in an other family member; Diabetes in an other family member; Emphysema in his father; Heart disease in an other family member; Hyperlipidemia in an other family member; Hypertension in an other family member; Kidney cancer in his daughter; Stroke in his father. There is no history of Colon cancer. Allergies  Allergen Reactions  . Celecoxib Nausea And Vomiting    REACTION: GI upset  . Chicken Protein Other (See Comments)    Will not eat chicken  . Fish-Derived Products Other (See Comments)    Will not eat seafood of any kind  . Morphine Itching    REACTION: itching  . Nalbuphine Other (See Comments)    REACTION: contraindication with oxycontin.  . Prednisone Other (See Comments)  REACTION: "yeast infections"  . Venlafaxine Other (See Comments)    REACTION: GI upset   Current Outpatient Prescriptions on File Prior to Visit  Medication Sig Dispense Refill  . ALPRAZolam (XANAX) 0.25 MG tablet take 1 tablet by mouth three times a day if needed  90 tablet  2  . AMBULATORY NON FORMULARY MEDICATION 2.5 Liters home oxygen continuous      . Aspirin-Salicylamide-Caffeine (BC HEADACHE POWDER PO) Take 1 Package by mouth 4 (four) times daily as needed (headache or pain).      Marland Kitchen docusate sodium (COLACE) 100 MG capsule Take 200 mg by mouth daily.       Marland Kitchen donepezil (ARICEPT) 5 MG tablet take 1 tablet by mouth at bedtime  30 tablet  6  . DULoxetine (CYMBALTA) 30 MG capsule Take 30 mg by mouth daily. Pt takes 90  mg daily      . DULoxetine (CYMBALTA) 60 MG capsule take 1 capsule by mouth once daily  90 capsule  2  . fluticasone (FLONASE) 50 MCG/ACT nasal spray instill 2 sprays into each nostril once daily  16 g  11  . glipiZIDE (GLUCOTROL XL) 2.5 MG 24 hr tablet Take 2.5 mg by mouth daily. Take only if blood sugar is greater than 180      . metFORMIN (GLUCOPHAGE) 500 MG tablet take 2 tablets by mouth twice a day  360 tablet  3  . Multiple Vitamin (MULTIVITAMIN) capsule Take 1 capsule by mouth daily.       . nitroGLYCERIN (NITROSTAT) 0.4 MG SL tablet Place 1 tablet (0.4 mg total) under the tongue every 5 (five) minutes as needed. For chest pain  25 tablet  6  . omeprazole (PRILOSEC) 20 MG capsule Take 1 capsule (20 mg total) by mouth 2 (two) times daily before a meal.  60 capsule  11  . OxyCODONE (OXYCONTIN) 40 mg T12A 12 hr tablet Take 40 mg by mouth 3 (three) times daily.      Marland Kitchen oxycodone (ROXICODONE) 30 MG immediate release tablet Take 30 mg by mouth every 4 (four) hours as needed for pain.      . pravastatin (PRAVACHOL) 80 MG tablet Take 1 tablet (80 mg total) by mouth daily.  90 tablet  3  . PROAIR HFA 108 (90 BASE) MCG/ACT inhaler inhale 1 to 2 puffs four times a day if needed  8.5 g  11  . zolpidem (AMBIEN) 10 MG tablet Take 10 mg by mouth at bedtime as needed. For sleep       No current facility-administered medications on file prior to visit.   Review of Systems  Constitutional: Negative for unusual diaphoresis or other sweats  HENT: Negative for ringing in ear Eyes: Negative for double vision or worsening visual disturbance.  Respiratory: Negative for choking and stridor.   Gastrointestinal: Negative for vomiting or other signifcant bowel change Genitourinary: Negative for hematuria or decreased urine volume.  Musculoskeletal: Negative for other MSK pain or swelling Skin: Negative for color change and worsening wound.  Neurological: Negative for tremors and numbness other than noted    Psychiatric/Behavioral: Negative for decreased concentration or agitation other than above       Objective:   Physical Exam BP 112/72  Pulse 65  Temp(Src) 97.3 F (36.3 C) (Oral)  Wt 142 lb (64.411 kg)  SpO2 97% VS noted,  Constitutional: Pt appears well-developed, well-nourished.  HENT: Head: NCAT.  Right Ear: External ear normal.  Left Ear: External ear normal.  Eyes: .  Pupils are equal, round, and reactive to light. Conjunctivae and EOM are normal Neck: Normal range of motion. Neck supple.  Cardiovascular: Normal rate and regular rhythm.   Pulmonary/Chest: Effort normal and breath sounds normal.  Abd:  Soft, NT, ND, + BS, no flank tender Neurological: Pt is alert. + confused - prob at baseline , motor grossly intact Skin: Skin is warm. No rash, no edema Psychiatric: Pt behavior is normal. No agitation.  DRE: declined    Assessment & Plan:

## 2014-02-01 NOTE — Progress Notes (Signed)
Pre visit review using our clinic review tool, if applicable. No additional management support is needed unless otherwise documented below in the visit note. 

## 2014-02-05 DIAGNOSIS — R300 Dysuria: Secondary | ICD-10-CM | POA: Insufficient documentation

## 2014-02-05 NOTE — Assessment & Plan Note (Signed)
Suspect likely recurrent prostatitis, for one month antibx, for UA if able, to f/u any worsening symptoms or concerns

## 2014-02-05 NOTE — Assessment & Plan Note (Signed)
Also for flomax x 1 mo, then trial off, but to re-start for recurrent symtpoms

## 2014-02-05 NOTE — Assessment & Plan Note (Signed)
stable overall by history and exam, recent data reviewed with pt, and pt to continue medical treatment as before,  to f/u any worsening symptoms or concerns BP Readings from Last 3 Encounters:  02/01/14 112/72  12/03/13 110/72  11/18/13 108/60

## 2014-02-05 NOTE — Assessment & Plan Note (Signed)
stable overall by history and exam, recent data reviewed with pt, and pt to continue medical treatment as before,  to f/u any worsening symptoms or concerns SpO2 Readings from Last 3 Encounters:  02/01/14 97%  12/03/13 91%  10/15/13 90%

## 2014-02-23 DIAGNOSIS — M25551 Pain in right hip: Secondary | ICD-10-CM | POA: Diagnosis not present

## 2014-02-23 DIAGNOSIS — M25552 Pain in left hip: Secondary | ICD-10-CM | POA: Diagnosis not present

## 2014-02-23 DIAGNOSIS — M25512 Pain in left shoulder: Secondary | ICD-10-CM | POA: Diagnosis not present

## 2014-02-23 DIAGNOSIS — M545 Low back pain: Secondary | ICD-10-CM | POA: Diagnosis not present

## 2014-02-23 DIAGNOSIS — G8929 Other chronic pain: Secondary | ICD-10-CM | POA: Diagnosis not present

## 2014-02-23 DIAGNOSIS — M25511 Pain in right shoulder: Secondary | ICD-10-CM | POA: Diagnosis not present

## 2014-02-24 DIAGNOSIS — H5021 Vertical strabismus, right eye: Secondary | ICD-10-CM | POA: Diagnosis not present

## 2014-02-24 DIAGNOSIS — E11329 Type 2 diabetes mellitus with mild nonproliferative diabetic retinopathy without macular edema: Secondary | ICD-10-CM | POA: Diagnosis not present

## 2014-02-24 DIAGNOSIS — H532 Diplopia: Secondary | ICD-10-CM | POA: Diagnosis not present

## 2014-02-24 DIAGNOSIS — F17200 Nicotine dependence, unspecified, uncomplicated: Secondary | ICD-10-CM | POA: Diagnosis not present

## 2014-02-24 DIAGNOSIS — H5010 Unspecified exotropia: Secondary | ICD-10-CM | POA: Diagnosis not present

## 2014-03-01 ENCOUNTER — Other Ambulatory Visit: Payer: Self-pay | Admitting: Internal Medicine

## 2014-03-08 DIAGNOSIS — M545 Low back pain: Secondary | ICD-10-CM | POA: Diagnosis not present

## 2014-03-08 DIAGNOSIS — M5412 Radiculopathy, cervical region: Secondary | ICD-10-CM | POA: Diagnosis not present

## 2014-03-08 DIAGNOSIS — G8929 Other chronic pain: Secondary | ICD-10-CM | POA: Diagnosis not present

## 2014-03-22 DIAGNOSIS — G8929 Other chronic pain: Secondary | ICD-10-CM | POA: Diagnosis not present

## 2014-03-22 DIAGNOSIS — M25551 Pain in right hip: Secondary | ICD-10-CM | POA: Diagnosis not present

## 2014-03-22 DIAGNOSIS — M25552 Pain in left hip: Secondary | ICD-10-CM | POA: Diagnosis not present

## 2014-03-22 DIAGNOSIS — Z79891 Long term (current) use of opiate analgesic: Secondary | ICD-10-CM | POA: Diagnosis not present

## 2014-03-22 DIAGNOSIS — M25561 Pain in right knee: Secondary | ICD-10-CM | POA: Diagnosis not present

## 2014-03-22 DIAGNOSIS — M25562 Pain in left knee: Secondary | ICD-10-CM | POA: Diagnosis not present

## 2014-03-22 DIAGNOSIS — M545 Low back pain: Secondary | ICD-10-CM | POA: Diagnosis not present

## 2014-03-25 DIAGNOSIS — M545 Low back pain: Secondary | ICD-10-CM | POA: Diagnosis not present

## 2014-03-25 DIAGNOSIS — G890 Central pain syndrome: Secondary | ICD-10-CM | POA: Diagnosis not present

## 2014-03-25 DIAGNOSIS — R202 Paresthesia of skin: Secondary | ICD-10-CM | POA: Diagnosis not present

## 2014-04-07 ENCOUNTER — Encounter: Payer: Self-pay | Admitting: Internal Medicine

## 2014-04-07 ENCOUNTER — Ambulatory Visit (INDEPENDENT_AMBULATORY_CARE_PROVIDER_SITE_OTHER): Payer: Medicare Other | Admitting: Internal Medicine

## 2014-04-07 VITALS — BP 112/72 | HR 73 | Temp 98.1°F | Wt 144.5 lb

## 2014-04-07 DIAGNOSIS — H6691 Otitis media, unspecified, right ear: Secondary | ICD-10-CM

## 2014-04-07 DIAGNOSIS — F411 Generalized anxiety disorder: Secondary | ICD-10-CM | POA: Diagnosis not present

## 2014-04-07 DIAGNOSIS — Z720 Tobacco use: Secondary | ICD-10-CM | POA: Diagnosis not present

## 2014-04-07 DIAGNOSIS — J9611 Chronic respiratory failure with hypoxia: Secondary | ICD-10-CM | POA: Diagnosis not present

## 2014-04-07 DIAGNOSIS — I2510 Atherosclerotic heart disease of native coronary artery without angina pectoris: Secondary | ICD-10-CM

## 2014-04-07 DIAGNOSIS — J449 Chronic obstructive pulmonary disease, unspecified: Secondary | ICD-10-CM | POA: Diagnosis not present

## 2014-04-07 DIAGNOSIS — F17200 Nicotine dependence, unspecified, uncomplicated: Secondary | ICD-10-CM

## 2014-04-07 MED ORDER — ALPRAZOLAM 0.25 MG PO TABS: ORAL_TABLET | ORAL | Status: DC

## 2014-04-07 MED ORDER — ALBUTEROL SULFATE HFA 108 (90 BASE) MCG/ACT IN AERS: 2.0000 | INHALATION_SPRAY | Freq: Four times a day (QID) | RESPIRATORY_TRACT | Status: DC | PRN

## 2014-04-07 MED ORDER — DULOXETINE HCL 60 MG PO CPEP: 60.0000 mg | ORAL_CAPSULE | Freq: Every day | ORAL | Status: DC

## 2014-04-07 MED ORDER — AZITHROMYCIN 250 MG PO TABS
ORAL_TABLET | ORAL | Status: DC
Start: 2014-04-07 — End: 2014-05-13

## 2014-04-07 NOTE — Patient Instructions (Addendum)
Please take all new medication as prescribed - the antibiotic  You can also take Delsym OTC for cough, and/or Mucinex (or it's generic off brand) for congestion, and tylenol as needed for pain.  Please continue all other medications as before, and refills have been done if requested, including changing the proair to ventolin due to the insurance coverage  Please have the pharmacy call with any other refills you may need.  Please keep your appointments with your specialists as you may have planned  You will be contacted regarding the referral for: CT chest for lung cancer screening

## 2014-04-07 NOTE — Progress Notes (Signed)
Pre visit review using our clinic review tool, if applicable. No additional management support is needed unless otherwise documented below in the visit note. 

## 2014-04-07 NOTE — Progress Notes (Signed)
Subjective:    Patient ID: Tyler Oh., male    DOB: [birth year], 73 y.o.   MRN: [id number]  HPI   Here with 2-3 days acute onset fever, right ear pain with reduced hearing, pressure, headache, general weakness and malaise, and greenish d/c, with mild ST and cough, but pt denies chest pain, wheezing, increased sob or doe, orthopnea, PND, increased LE swelling, palpitations, dizziness or syncope. Pt denies new neurological symptoms such as new headache, or facial or extremity weakness or numbness   Pt denies polydipsia, polyuria,.  Pt states overall good compliance with meds, trying to follow lower cholesterol, diabetic diet, wt overall stable but little exercise however.   Denies worsening depressive symptoms, suicidal ideation, or panic; has ongoing anxiety, not increased recently, asks for xanax refill. Dementia overall stable symptomatically, and not assoc with behavioral changes such as hallucinations, paranoia, or agitation.  Still smoking - 1 ppd x 65 yrs, not ready to quit. For start low dose CT lung Ca screening;  Per CMS guidelines, I have determined the eligibility including patient age (89-80), and absence of any signs of lung cancer.  Specific calculation of number of pack years is documented.  Quit smoking years is documented.  Shared decision making engaged today, including a discussion of the benefits and harms of screening, discussion of need for followup with additional testing, risks of over-diagnosis, risk of false-positive screening examinations, and risk of radiation exposure.  Counseling done today on the importance of adherence to annual lunge cancer LDCT screening, importance of quit smoking and remaining quit, and tobacco cessation instructions given.  There is also discussion with patient regarding the impact of comorbidities, and patient states is able and willing to undergo diagnosis and treatment. Past Medical History  Diagnosis Date  . COPD (chronic obstructive  pulmonary disease)   . Allergy   . Diabetes mellitus   . Emphysema of lung   . GERD (gastroesophageal reflux disease)   . Hypertension   . Hyperlipidemia   . CAD (coronary artery disease)     a. s/p multiple PCIs to RCA and eventual CABG in 1999;  b.LHC 11/2003: CFX 95%, RCA occluded, SVG-RCA occluded, SVG-OM patent, native LAD patent. EF was 55%.  Patient had left to right collaterals and medical therapy was recommended    . Anxiety   . PVD (peripheral vascular disease)   . OA (osteoarthritis)   . Asthma   . Iron deficiency anemia   . Memory loss   . Insomnia   . DDD (degenerative disc disease), lumbar   . OSA (obstructive sleep apnea)   . Depression   . Benign prostatic hypertrophy   . Lumbar back pain     chronic  . Spinal stenosis   . Herniated disc   . History of colonic polyps   . Diverticulitis, colon   . Esophageal stricture   . Hx of colonoscopy   . Hiatal hernia   . HOH (hard of hearing)    Past Surgical History  Procedure Laterality Date  . Heart bypass    . Bilateral knee replacements    . Hemorrhoid surgery    . Carpal tunnel release    . Shoulder arthroscopy  right  . Coronary artery bypass graft  09/1997    double bypss  . Coronary angioplasty with stent placement  2/99  . Strabismus surgery  08/29/2011    Procedure: REPAIR STRABISMUS;  Surgeon: Derry Skill, MD;  Location: [facility];  Service: Ophthalmology;  Laterality: Left;    reports that he has been smoking Cigarettes.  He has a 40 pack-year smoking history. He has never used smokeless tobacco. He reports that he does not drink alcohol or use illicit drugs. family history includes Allergies in an other family member; Diabetes in an other family member; Emphysema in his father; Heart disease in an other family member; Hyperlipidemia in an other family member; Hypertension in an other family member; Kidney cancer in his daughter; Stroke in his father. There is no history of Colon cancer. Allergies    Allergen Reactions  . Celecoxib Nausea And Vomiting    REACTION: GI upset  . Chicken Protein Other (See Comments)    Will not eat chicken  . Fish-Derived Products Other (See Comments)    Will not eat seafood of any kind  . Morphine Itching    REACTION: itching  . Nalbuphine Other (See Comments)    REACTION: contraindication with oxycontin.  . Prednisone Other (See Comments)    REACTION: "yeast infections"  . Venlafaxine Other (See Comments)    REACTION: GI upset   Current Outpatient Prescriptions on File Prior to Visit  Medication Sig Dispense Refill  . AMBULATORY NON FORMULARY MEDICATION 2.5 Liters home oxygen continuous    . Aspirin-Salicylamide-Caffeine (BC HEADACHE POWDER PO) Take 1 Package by mouth 4 (four) times daily as needed (headache or pain).    Marland Kitchen docusate sodium (COLACE) 100 MG capsule Take 200 mg by mouth daily.     Marland Kitchen donepezil (ARICEPT) 5 MG tablet take 1 tablet by mouth at bedtime 30 tablet 6  . fluticasone (FLONASE) 50 MCG/ACT nasal spray instill 2 sprays into each nostril once daily 16 g 11  . glipiZIDE (GLUCOTROL XL) 2.5 MG 24 hr tablet Take 2.5 mg by mouth daily. Take only if blood sugar is greater than 180    . metFORMIN (GLUCOPHAGE) 500 MG tablet take 2 tablet by mouth twice a day 360 tablet 3  . Multiple Vitamin (MULTIVITAMIN) capsule Take 1 capsule by mouth daily.     . nitroGLYCERIN (NITROSTAT) 0.4 MG SL tablet Place 1 tablet (0.4 mg total) under the tongue every 5 (five) minutes as needed. For chest pain 25 tablet 6  . omeprazole (PRILOSEC) 20 MG capsule Take 1 capsule (20 mg total) by mouth 2 (two) times daily before a meal. 60 capsule 11  . OxyCODONE (OXYCONTIN) 40 mg T12A 12 hr tablet Take 40 mg by mouth 3 (three) times daily.    Marland Kitchen oxycodone (ROXICODONE) 30 MG immediate release tablet Take 30 mg by mouth every 4 (four) hours as needed for pain.    . pravastatin (PRAVACHOL) 80 MG tablet Take 1 tablet (80 mg total) by mouth daily. 90 tablet 3  . zolpidem  (AMBIEN) 10 MG tablet Take 10 mg by mouth at bedtime as needed. For sleep     No current facility-administered medications on file prior to visit.   Review of Systems  Constitutional: Negative for unusual diaphoresis or other sweats  HENT: Negative for ringing in ear Eyes: Negative for double vision or worsening visual disturbance.  Respiratory: Negative for choking and stridor.   Gastrointestinal: Negative for vomiting or other signifcant bowel change Genitourinary: Negative for hematuria or decreased urine volume.  Musculoskeletal: Negative for other MSK pain or swelling Skin: Negative for color change and worsening wound.  Neurological: Negative for tremors and numbness other than noted  Psychiatric/Behavioral: Negative for decreased concentration or agitation other than above  Objective:   Physical Exam BP 112/72 mmHg  Pulse 73  Temp(Src) 98.1 F (36.7 C) (Oral)  Wt 144 lb 8 oz (65.545 kg)  SpO2 92% VS noted, not ill appearing Constitutional: Pt appears well-developed, well-nourished.  HENT: Head: NCAT.  Right Ear: External ear normal.  Left Ear: External ear normal.  Eyes: . Pupils are equal, round, and reactive to light. Conjunctivae and EOM are normal Right tm's with mod erythema and retraction,  Max sinus areas mild tender.  Pharynx with mild erythema, no exudate Neck: Normal range of motion. Neck supple.  Cardiovascular: Normal rate and regular rhythm.   Pulmonary/Chest: Effort normal and breath sounds without rales or wheezing. but decreased bilat Neurological: Pt is alert. + confused at baseline , motor grossly intact Skin: Skin is warm. No rash Psychiatric: Pt behavior is normal. No agitation.     Assessment & Plan:

## 2014-04-09 NOTE — Assessment & Plan Note (Signed)
stable overall by history and exam, recent data reviewed with pt, and pt to continue medical treatment as before,  to f/u any worsening symptoms or concerns SpO2 Readings from Last 3 Encounters:  04/07/14 92%  02/01/14 97%  12/03/13 91%

## 2014-04-09 NOTE — Assessment & Plan Note (Signed)
Urged to quit, not yet ready, also for low dose CT chest for yearly lung ca screening

## 2014-04-09 NOTE — Assessment & Plan Note (Signed)
stable overall by history and exam, recent data reviewed with pt, and pt to continue medical treatment as before,  to f/u any worsening symptoms or concerns Lab Results  Component Value Date   WBC 8.8 12/03/2013   HGB 13.7 12/03/2013   HCT 42.2 12/03/2013   PLT 241.0 12/03/2013   GLUCOSE 90 12/03/2013   CHOL 134 12/03/2013   TRIG 128.0 12/03/2013   HDL 43.10 12/03/2013   LDLDIRECT 98.6 05/31/2010   LDLCALC 65 12/03/2013   ALT 11 12/03/2013   AST 19 12/03/2013   NA 139 12/03/2013   K 4.9 12/03/2013   CL 101 12/03/2013   CREATININE 1.2 12/03/2013   BUN 31* 12/03/2013   CO2 28 12/03/2013   TSH 1.19 12/03/2013   PSA 0.10 12/03/2013   HGBA1C 6.9* 10/15/2013   MICROALBUR 1.0 01/16/2010

## 2014-04-09 NOTE — Assessment & Plan Note (Addendum)
Mild to mod, for antibx course,  to f/u any worsening symptoms or concerns  Note:  Total time for pt hx, exam, review of record with pt in the room, determination of diagnoses and plan for further eval and tx is > 40 min, with over 50% spent in coordination and counseling of patient 

## 2014-04-14 ENCOUNTER — Encounter: Payer: Self-pay | Admitting: Internal Medicine

## 2014-04-14 DIAGNOSIS — H9209 Otalgia, unspecified ear: Secondary | ICD-10-CM

## 2014-04-14 DIAGNOSIS — G894 Chronic pain syndrome: Secondary | ICD-10-CM

## 2014-04-18 ENCOUNTER — Other Ambulatory Visit: Payer: Self-pay | Admitting: Internal Medicine

## 2014-04-18 ENCOUNTER — Encounter: Payer: Self-pay | Admitting: Internal Medicine

## 2014-04-19 MED ORDER — ZOLPIDEM TARTRATE 5 MG PO TABS: 5.0000 mg | ORAL_TABLET | Freq: Every evening | ORAL | Status: DC | PRN

## 2014-04-19 NOTE — Telephone Encounter (Signed)
Done hardcopy to [employer]

## 2014-04-21 ENCOUNTER — Ambulatory Visit (INDEPENDENT_AMBULATORY_CARE_PROVIDER_SITE_OTHER)
Admission: RE | Admit: 2014-04-21 | Discharge: 2014-04-21 | Disposition: A | Discharge status: Home / Self Care | Payer: Medicare Other | Source: Ambulatory Visit | Attending: Internal Medicine | Admitting: Internal Medicine

## 2014-04-21 DIAGNOSIS — Z720 Tobacco use: Secondary | ICD-10-CM | POA: Diagnosis not present

## 2014-04-21 DIAGNOSIS — J9809 Other diseases of bronchus, not elsewhere classified: Secondary | ICD-10-CM | POA: Diagnosis not present

## 2014-04-21 DIAGNOSIS — Z122 Encounter for screening for malignant neoplasm of respiratory organs: Secondary | ICD-10-CM | POA: Diagnosis not present

## 2014-04-21 DIAGNOSIS — J439 Emphysema, unspecified: Secondary | ICD-10-CM | POA: Diagnosis not present

## 2014-04-21 DIAGNOSIS — J449 Chronic obstructive pulmonary disease, unspecified: Secondary | ICD-10-CM | POA: Diagnosis not present

## 2014-04-21 DIAGNOSIS — F17200 Nicotine dependence, unspecified, uncomplicated: Secondary | ICD-10-CM

## 2014-04-25 ENCOUNTER — Encounter: Payer: Self-pay | Admitting: Internal Medicine

## 2014-05-02 ENCOUNTER — Other Ambulatory Visit: Payer: Self-pay | Admitting: Internal Medicine

## 2014-05-08 ENCOUNTER — Encounter: Payer: Self-pay | Admitting: Internal Medicine

## 2014-05-09 DIAGNOSIS — H903 Sensorineural hearing loss, bilateral: Secondary | ICD-10-CM | POA: Diagnosis not present

## 2014-05-09 DIAGNOSIS — R42 Dizziness and giddiness: Secondary | ICD-10-CM | POA: Diagnosis not present

## 2014-05-09 DIAGNOSIS — H9311 Tinnitus, right ear: Secondary | ICD-10-CM | POA: Diagnosis not present

## 2014-05-10 NOTE — Telephone Encounter (Signed)
PCC's to see pt remarks

## 2014-05-11 ENCOUNTER — Other Ambulatory Visit: Payer: Self-pay | Admitting: Internal Medicine

## 2014-05-11 DIAGNOSIS — H532 Diplopia: Secondary | ICD-10-CM | POA: Diagnosis not present

## 2014-05-11 DIAGNOSIS — Z9889 Other specified postprocedural states: Secondary | ICD-10-CM | POA: Diagnosis not present

## 2014-05-11 DIAGNOSIS — H50012 Monocular esotropia, left eye: Secondary | ICD-10-CM | POA: Diagnosis not present

## 2014-05-11 DIAGNOSIS — H5000 Unspecified esotropia: Secondary | ICD-10-CM | POA: Diagnosis not present

## 2014-05-13 ENCOUNTER — Encounter: Payer: Self-pay | Admitting: Internal Medicine

## 2014-05-13 ENCOUNTER — Ambulatory Visit (INDEPENDENT_AMBULATORY_CARE_PROVIDER_SITE_OTHER): Payer: Medicare Other | Admitting: Internal Medicine

## 2014-05-13 VITALS — BP 140/68 | HR 66 | Temp 97.7°F | Ht 65.0 in | Wt 142.5 lb

## 2014-05-13 DIAGNOSIS — J441 Chronic obstructive pulmonary disease with (acute) exacerbation: Secondary | ICD-10-CM | POA: Diagnosis not present

## 2014-05-13 DIAGNOSIS — I10 Essential (primary) hypertension: Secondary | ICD-10-CM | POA: Diagnosis not present

## 2014-05-13 DIAGNOSIS — E119 Type 2 diabetes mellitus without complications: Secondary | ICD-10-CM | POA: Diagnosis not present

## 2014-05-13 MED ORDER — METHYLPREDNISOLONE 4 MG PO KIT: PACK | ORAL | Status: DC

## 2014-05-13 MED ORDER — METHYLPREDNISOLONE ACETATE 80 MG/ML IJ SUSP
80.0000 mg | Freq: Once | INTRAMUSCULAR | Status: AC
  Administered 2014-05-13: 80 mg via INTRAMUSCULAR

## 2014-05-13 MED ORDER — HYDROCODONE-HOMATROPINE 5-1.5 MG/5ML PO SYRP: 5.0000 mL | ORAL_SOLUTION | Freq: Four times a day (QID) | ORAL | Status: DC | PRN

## 2014-05-13 MED ORDER — CEFTRIAXONE SODIUM 1 G IJ SOLR
1.0000 g | Freq: Once | INTRAMUSCULAR | Status: AC
  Administered 2014-05-13: 1 g via INTRAMUSCULAR

## 2014-05-13 MED ORDER — LEVOFLOXACIN 250 MG PO TABS: 250.0000 mg | ORAL_TABLET | Freq: Every day | ORAL | Status: DC

## 2014-05-13 MED ORDER — BUDESONIDE 0.5 MG/2ML IN SUSP: 0.5000 mg | Freq: Two times a day (BID) | RESPIRATORY_TRACT | Status: DC

## 2014-05-13 NOTE — Progress Notes (Signed)
Subjective:    Patient ID: Tyler Oh., male    DOB: [birth year], 74 y.o.   MRN: [id number]  HPI  Here with wife, with acute onset 2-3 days feverish, prod cough yellow/gray sputum, with sob/wheezing/increased wob despite o2 and inhaled meds.  Pt denies chest pain, orthopnea, PND, increased LE swelling, palpitations, dizziness or syncope. Pt denies new neurological symptoms such as new headache, or facial or extremity weakness or numbness   Pt denies polydipsia, polyuria, Dementia overall stable symptomatically with gradual worsening at best, and not assoc with behavioral changes such as hallucinations, paranoia, or agitation. Past Medical History  Diagnosis Date  . COPD (chronic obstructive pulmonary disease)   . Allergy   . Diabetes mellitus   . Emphysema of lung   . GERD (gastroesophageal reflux disease)   . Hypertension   . Hyperlipidemia   . CAD (coronary artery disease)     a. s/p multiple PCIs to RCA and eventual CABG in 1999;  b.LHC 11/2003: CFX 95%, RCA occluded, SVG-RCA occluded, SVG-OM patent, native LAD patent. EF was 55%.  Patient had left to right collaterals and medical therapy was recommended    . Anxiety   . PVD (peripheral vascular disease)   . OA (osteoarthritis)   . Asthma   . Iron deficiency anemia   . Memory loss   . Insomnia   . DDD (degenerative disc disease), lumbar   . OSA (obstructive sleep apnea)   . Depression   . Benign prostatic hypertrophy   . Lumbar back pain     chronic  . Spinal stenosis   . Herniated disc   . History of colonic polyps   . Diverticulitis, colon   . Esophageal stricture   . Hx of colonoscopy   . Hiatal hernia   . HOH (hard of hearing)    Past Surgical History  Procedure Laterality Date  . Heart bypass    . Bilateral knee replacements    . Hemorrhoid surgery    . Carpal tunnel release    . Shoulder arthroscopy  right  . Coronary artery bypass graft  09/1997    double bypss  . Coronary angioplasty with stent  placement  2/99  . Strabismus surgery  08/29/2011    Procedure: REPAIR STRABISMUS;  Surgeon: Derry Skill, MD;  Location: [facility];  Service: Ophthalmology;  Laterality: Left;    reports that he has been smoking Cigarettes.  He has a 40 pack-year smoking history. He has never used smokeless tobacco. He reports that he does not drink alcohol or use illicit drugs. family history includes Allergies in an other family member; Diabetes in an other family member; Emphysema in his father; Heart disease in an other family member; Hyperlipidemia in an other family member; Hypertension in an other family member; Kidney cancer in his daughter; Stroke in his father. There is no history of Colon cancer. Allergies  Allergen Reactions  . Celecoxib Nausea And Vomiting    REACTION: GI upset  . Chicken Protein Other (See Comments)    Will not eat chicken  . Fish-Derived Products Other (See Comments)    Will not eat seafood of any kind  . Morphine Itching    REACTION: itching  . Nalbuphine Other (See Comments)    REACTION: contraindication with oxycontin.  . Prednisone Other (See Comments)    REACTION: "yeast infections"  . Venlafaxine Other (See Comments)    REACTION: GI upset   Current Outpatient Prescriptions on File Prior to Visit  Medication Sig Dispense Refill  . albuterol (VENTOLIN HFA) 108 (90 BASE) MCG/ACT inhaler Inhale 2 puffs into the lungs every 6 (six) hours as needed for wheezing or shortness of breath. 1 Inhaler 11  . ALPRAZolam (XANAX) 0.25 MG tablet take 1 tablet by mouth three times a day if needed 90 tablet 2  . AMBULATORY NON FORMULARY MEDICATION 2.5 Liters home oxygen continuous    . Aspirin-Salicylamide-Caffeine (BC HEADACHE POWDER PO) Take 1 Package by mouth 4 (four) times daily as needed (headache or pain).    Marland Kitchen docusate sodium (COLACE) 100 MG capsule Take 200 mg by mouth daily.     Marland Kitchen donepezil (ARICEPT) 10 MG tablet Take 1 tablet by mouth at bedtime 30 tablet 8  . donepezil  (ARICEPT) 5 MG tablet take 1 tablet by mouth at bedtime 30 tablet 6  . DULoxetine (CYMBALTA) 60 MG capsule Take 1 capsule (60 mg total) by mouth daily. 30 capsule 11  . fluticasone (FLONASE) 50 MCG/ACT nasal spray instill 2 sprays into each nostril once daily 16 g 11  . glipiZIDE (GLUCOTROL XL) 2.5 MG 24 hr tablet Take 2.5 mg by mouth daily. Take only if blood sugar is greater than 180    . metFORMIN (GLUCOPHAGE) 500 MG tablet take 2 tablet by mouth twice a day 360 tablet 3  . Multiple Vitamin (MULTIVITAMIN) capsule Take 1 capsule by mouth daily.     . nitroGLYCERIN (NITROSTAT) 0.4 MG SL tablet Place 1 tablet (0.4 mg total) under the tongue every 5 (five) minutes as needed. For chest pain 25 tablet 6  . omeprazole (PRILOSEC) 20 MG capsule Take 1 capsule (20 mg total) by mouth 2 (two) times daily before a meal. 60 capsule 11  . OxyCODONE (OXYCONTIN) 40 mg T12A 12 hr tablet Take 40 mg by mouth 3 (three) times daily.    Marland Kitchen oxycodone (ROXICODONE) 30 MG immediate release tablet Take 30 mg by mouth every 4 (four) hours as needed for pain.    . pravastatin (PRAVACHOL) 80 MG tablet Take 1 tablet (80 mg total) by mouth daily. 90 tablet 3  . zolpidem (AMBIEN) 10 MG tablet Take 10 mg by mouth at bedtime as needed. For sleep    . zolpidem (AMBIEN) 5 MG tablet Take 1 tablet (5 mg total) by mouth at bedtime as needed for sleep. 30 tablet 5   No current facility-administered medications on file prior to visit.   Review of Systems  Constitutional: Negative for unusual diaphoresis or other sweats  HENT: Negative for ringing in ear Eyes: Negative for double vision or worsening visual disturbance.  Respiratory: Negative for choking and stridor.   Gastrointestinal: Negative for vomiting or other signifcant bowel change Genitourinary: Negative for hematuria or decreased urine volume.  Musculoskeletal: Negative for other MSK pain or swelling Skin: Negative for color change and worsening wound.  Neurological:  Negative for tremors and numbness other than noted  Psychiatric/Behavioral: Negative for decreased concentration or agitation other than above       Objective:   Physical Exam BP 140/68 mmHg  Pulse 66  Temp(Src) 97.7 F (36.5 C) (Oral)  Ht 5\' 5"  (1.651 m)  Wt 142 lb 8 oz (64.638 kg)  BMI 23.71 kg/m2  SpO2 93% VS noted, mild ill Constitutional: Pt appears well-developed, well-nourished.  HENT: Head: NCAT.  Right Ear: External ear normal.  Left Ear: External ear normal.  Eyes: . Pupils are equal, round, and reactive to light. Conjunctivae and EOM are normal Neck: Normal range of  motion. Neck supple.  Cardiovascular: Normal rate and regular rhythm.   Pulmonary/Chest: Effort normal but decreased breath sounds without rales but diffuse midl to mod wheezing.  Abd:  Soft, NT, ND, + BS Neurological: Pt is alert. + confused at baseline , motor grossly intact Skin: Skin is warm. No rash Psychiatric: Pt behavior is normal. No agitation.     Assessment & Plan:

## 2014-05-13 NOTE — Patient Instructions (Signed)
You had the steroid shot today, and the antibiotic shot  Please take all new medication as prescribed - the antibiotic, cough medicine, and medrol pack (pill steroid)  We will fax the order for Nebulizer and Pulmicort nebs to [facility]  Please continue all other medications as before, and refills have been done if requested.  Please have the pharmacy call with any other refills you may need.  Please keep your appointments with your specialists as you may have planned

## 2014-05-13 NOTE — Progress Notes (Signed)
Pre visit review using our clinic review tool, if applicable. No additional management support is needed unless otherwise documented below in the visit note. 

## 2014-05-15 NOTE — Assessment & Plan Note (Signed)
stable overall by history and exam, recent data reviewed with pt, and pt to continue medical treatment as before,  to f/u any worsening symptoms or concerns Lab Results  Component Value Date   HGBA1C 6.9* 10/15/2013

## 2014-05-15 NOTE — Assessment & Plan Note (Addendum)
Mild to mod, for antibx course, depomedrol IM, predpac asd, cont'd inhaled med use except to add pulmicort nebs at home,  to f/u any worsening symptoms or concerns, declines cxr today

## 2014-05-15 NOTE — Assessment & Plan Note (Signed)
stable overall by history and exam, recent data reviewed with pt, and pt to continue medical treatment as before,  to f/u any worsening symptoms or concerns BP Readings from Last 3 Encounters:  05/13/14 140/68  04/07/14 112/72  02/01/14 112/72

## 2014-05-25 ENCOUNTER — Telehealth: Payer: Self-pay | Admitting: Internal Medicine

## 2014-05-25 NOTE — Telephone Encounter (Signed)
noted 

## 2014-05-25 NOTE — Telephone Encounter (Signed)
Calling to advise nebulizer order for the patient was declined by the patient. He does not want it.

## 2014-06-09 DIAGNOSIS — F332 Major depressive disorder, recurrent severe without psychotic features: Secondary | ICD-10-CM | POA: Diagnosis not present

## 2014-06-09 DIAGNOSIS — F0281 Dementia in other diseases classified elsewhere with behavioral disturbance: Secondary | ICD-10-CM | POA: Diagnosis not present

## 2014-06-09 DIAGNOSIS — F411 Generalized anxiety disorder: Secondary | ICD-10-CM | POA: Diagnosis not present

## 2014-07-05 DIAGNOSIS — M545 Low back pain: Secondary | ICD-10-CM | POA: Diagnosis not present

## 2014-07-05 DIAGNOSIS — G890 Central pain syndrome: Secondary | ICD-10-CM | POA: Diagnosis not present

## 2014-07-05 DIAGNOSIS — M5441 Lumbago with sciatica, right side: Secondary | ICD-10-CM | POA: Diagnosis not present

## 2014-07-05 DIAGNOSIS — M5412 Radiculopathy, cervical region: Secondary | ICD-10-CM | POA: Diagnosis not present

## 2014-07-13 ENCOUNTER — Other Ambulatory Visit: Payer: Self-pay | Admitting: *Deleted

## 2014-07-13 DIAGNOSIS — H50012 Monocular esotropia, left eye: Secondary | ICD-10-CM | POA: Diagnosis not present

## 2014-07-13 NOTE — Telephone Encounter (Signed)
Received fax pt needing PA on his zolpidem. Completed PA on cover-my-meds. Received approval confirmation stating med is covered through 04/14/14-04/08/15. Called pharmacy spoke with Josie Dixon gave her approval status....[name]

## 2014-07-14 ENCOUNTER — Other Ambulatory Visit: Payer: Self-pay | Admitting: Internal Medicine

## 2014-07-14 DIAGNOSIS — F332 Major depressive disorder, recurrent severe without psychotic features: Secondary | ICD-10-CM | POA: Diagnosis not present

## 2014-07-14 DIAGNOSIS — F411 Generalized anxiety disorder: Secondary | ICD-10-CM | POA: Diagnosis not present

## 2014-07-14 DIAGNOSIS — F0281 Dementia in other diseases classified elsewhere with behavioral disturbance: Secondary | ICD-10-CM | POA: Diagnosis not present

## 2014-07-15 NOTE — Telephone Encounter (Signed)
Done hardcopy to Cherina  

## 2014-07-15 NOTE — Telephone Encounter (Signed)
Rx done. 

## 2014-08-02 DIAGNOSIS — G890 Central pain syndrome: Secondary | ICD-10-CM | POA: Diagnosis not present

## 2014-08-02 DIAGNOSIS — M5440 Lumbago with sciatica, unspecified side: Secondary | ICD-10-CM | POA: Diagnosis not present

## 2014-08-02 DIAGNOSIS — M25511 Pain in right shoulder: Secondary | ICD-10-CM | POA: Diagnosis not present

## 2014-08-02 DIAGNOSIS — M25512 Pain in left shoulder: Secondary | ICD-10-CM | POA: Diagnosis not present

## 2014-08-02 DIAGNOSIS — M25562 Pain in left knee: Secondary | ICD-10-CM | POA: Diagnosis not present

## 2014-08-02 DIAGNOSIS — M25561 Pain in right knee: Secondary | ICD-10-CM | POA: Diagnosis not present

## 2014-08-03 ENCOUNTER — Other Ambulatory Visit: Payer: Self-pay | Admitting: Internal Medicine

## 2014-08-03 DIAGNOSIS — G894 Chronic pain syndrome: Secondary | ICD-10-CM

## 2014-09-01 DIAGNOSIS — M545 Low back pain: Secondary | ICD-10-CM | POA: Diagnosis not present

## 2014-09-01 DIAGNOSIS — G8929 Other chronic pain: Secondary | ICD-10-CM | POA: Diagnosis not present

## 2014-09-15 DIAGNOSIS — F332 Major depressive disorder, recurrent severe without psychotic features: Secondary | ICD-10-CM | POA: Diagnosis not present

## 2014-09-15 DIAGNOSIS — G4700 Insomnia, unspecified: Secondary | ICD-10-CM | POA: Diagnosis not present

## 2014-09-15 DIAGNOSIS — F411 Generalized anxiety disorder: Secondary | ICD-10-CM | POA: Diagnosis not present

## 2014-09-15 DIAGNOSIS — F0281 Dementia in other diseases classified elsewhere with behavioral disturbance: Secondary | ICD-10-CM | POA: Diagnosis not present

## 2014-09-28 DIAGNOSIS — M5440 Lumbago with sciatica, unspecified side: Secondary | ICD-10-CM | POA: Diagnosis not present

## 2014-09-28 DIAGNOSIS — M25561 Pain in right knee: Secondary | ICD-10-CM | POA: Diagnosis not present

## 2014-09-28 DIAGNOSIS — G8929 Other chronic pain: Secondary | ICD-10-CM | POA: Diagnosis not present

## 2014-09-28 DIAGNOSIS — M25562 Pain in left knee: Secondary | ICD-10-CM | POA: Diagnosis not present

## 2014-09-28 DIAGNOSIS — M545 Low back pain: Secondary | ICD-10-CM | POA: Diagnosis not present

## 2014-09-28 DIAGNOSIS — M25512 Pain in left shoulder: Secondary | ICD-10-CM | POA: Diagnosis not present

## 2014-09-28 DIAGNOSIS — M25511 Pain in right shoulder: Secondary | ICD-10-CM | POA: Diagnosis not present

## 2014-09-28 DIAGNOSIS — G890 Central pain syndrome: Secondary | ICD-10-CM | POA: Diagnosis not present

## 2014-10-03 ENCOUNTER — Other Ambulatory Visit: Payer: Self-pay

## 2014-10-11 DIAGNOSIS — F411 Generalized anxiety disorder: Secondary | ICD-10-CM | POA: Diagnosis not present

## 2014-10-11 DIAGNOSIS — F0281 Dementia in other diseases classified elsewhere with behavioral disturbance: Secondary | ICD-10-CM | POA: Diagnosis not present

## 2014-10-11 DIAGNOSIS — F332 Major depressive disorder, recurrent severe without psychotic features: Secondary | ICD-10-CM | POA: Diagnosis not present

## 2014-10-25 DIAGNOSIS — R202 Paresthesia of skin: Secondary | ICD-10-CM | POA: Diagnosis not present

## 2014-10-25 DIAGNOSIS — M545 Low back pain: Secondary | ICD-10-CM | POA: Diagnosis not present

## 2014-10-25 DIAGNOSIS — G890 Central pain syndrome: Secondary | ICD-10-CM | POA: Diagnosis not present

## 2014-10-25 DIAGNOSIS — M542 Cervicalgia: Secondary | ICD-10-CM | POA: Diagnosis not present

## 2014-11-08 ENCOUNTER — Other Ambulatory Visit: Payer: Self-pay | Admitting: Internal Medicine

## 2014-11-08 NOTE — Telephone Encounter (Signed)
Rx faxed to pharmacy  

## 2014-11-08 NOTE — Telephone Encounter (Signed)
Done hardcopy to Dahlia  

## 2014-11-09 ENCOUNTER — Encounter: Payer: Self-pay | Admitting: Cardiovascular Disease

## 2014-11-09 ENCOUNTER — Ambulatory Visit (INDEPENDENT_AMBULATORY_CARE_PROVIDER_SITE_OTHER): Payer: Medicare Other | Admitting: Internal Medicine

## 2014-11-09 ENCOUNTER — Ambulatory Visit (INDEPENDENT_AMBULATORY_CARE_PROVIDER_SITE_OTHER): Payer: Medicare Other | Admitting: Cardiovascular Disease

## 2014-11-09 ENCOUNTER — Other Ambulatory Visit (INDEPENDENT_AMBULATORY_CARE_PROVIDER_SITE_OTHER): Payer: Medicare Other

## 2014-11-09 ENCOUNTER — Encounter: Payer: Self-pay | Admitting: Internal Medicine

## 2014-11-09 ENCOUNTER — Telehealth: Payer: Self-pay

## 2014-11-09 VITALS — BP 86/60 | HR 62 | Ht 66.0 in | Wt 140.8 lb

## 2014-11-09 VITALS — BP 124/72 | HR 63 | Temp 97.7°F | Ht 66.0 in | Wt 141.0 lb

## 2014-11-09 DIAGNOSIS — E119 Type 2 diabetes mellitus without complications: Secondary | ICD-10-CM

## 2014-11-09 DIAGNOSIS — R51 Headache: Secondary | ICD-10-CM

## 2014-11-09 DIAGNOSIS — J9611 Chronic respiratory failure with hypoxia: Secondary | ICD-10-CM | POA: Diagnosis not present

## 2014-11-09 DIAGNOSIS — J438 Other emphysema: Secondary | ICD-10-CM | POA: Diagnosis not present

## 2014-11-09 DIAGNOSIS — I2510 Atherosclerotic heart disease of native coronary artery without angina pectoris: Secondary | ICD-10-CM | POA: Diagnosis not present

## 2014-11-09 DIAGNOSIS — E785 Hyperlipidemia, unspecified: Secondary | ICD-10-CM

## 2014-11-09 DIAGNOSIS — I10 Essential (primary) hypertension: Secondary | ICD-10-CM | POA: Diagnosis not present

## 2014-11-09 DIAGNOSIS — Z720 Tobacco use: Secondary | ICD-10-CM | POA: Diagnosis not present

## 2014-11-09 DIAGNOSIS — R519 Headache, unspecified: Secondary | ICD-10-CM | POA: Insufficient documentation

## 2014-11-09 LAB — BASIC METABOLIC PANEL
BUN: 28 mg/dL — ABNORMAL HIGH (ref 6–23)
BUN: 27 mg/dL — ABNORMAL HIGH (ref 7–25)
CHLORIDE: 105 mmol/L (ref 98–110)
CO2: 26 mEq/L (ref 19–32)
CO2: 20 mmol/L (ref 20–31)
CREATININE: 1.41 mg/dL — AB (ref 0.70–1.18)
Calcium: 9.8 mg/dL (ref 8.4–10.5)
Calcium: 8.6 mg/dL (ref 8.6–10.3)
Chloride: 106 mEq/L (ref 96–112)
Creatinine, Ser: 1.45 mg/dL (ref 0.40–1.50)
GFR: 50.88 mL/min — ABNORMAL LOW (ref >60.00)
GLUCOSE: 70 mg/dL (ref 65–99)
Glucose, Bld: 79 mg/dL (ref 70–99)
Potassium: 6.3 mEq/L (ref 3.5–5.1)
Potassium: 4.7 mmol/L (ref 3.5–5.3)
SODIUM: 138 mmol/L (ref 135–146)
Sodium: 139 mEq/L (ref 135–145)

## 2014-11-09 LAB — HEPATIC FUNCTION PANEL
ALBUMIN: 4.3 g/dL (ref 3.5–5.2)
ALT: 10 U/L (ref 0–53)
AST: 14 U/L (ref 0–37)
Alkaline Phosphatase: 80 U/L (ref 39–117)
BILIRUBIN TOTAL: 0.2 mg/dL (ref 0.2–1.2)
Bilirubin, Direct: 0.1 mg/dL (ref 0.0–0.3)
Total Protein: 7.8 g/dL (ref 6.0–8.3)

## 2014-11-09 LAB — LIPID PANEL
CHOL/HDL RATIO: 3
CHOLESTEROL: 129 mg/dL (ref 0–200)
HDL: 42.8 mg/dL (ref >39.00)
NONHDL: 86.54
Triglycerides: 208 mg/dL — ABNORMAL HIGH (ref 0.0–149.0)
VLDL: 41.6 mg/dL — ABNORMAL HIGH (ref 0.0–40.0)

## 2014-11-09 LAB — HEMOGLOBIN A1C: Hgb A1c MFr Bld: 6.5 % (ref 4.6–6.5)

## 2014-11-09 LAB — LDL CHOLESTEROL, DIRECT: LDL DIRECT: 70 mg/dL

## 2014-11-09 MED ORDER — NITROGLYCERIN 0.4 MG SL SUBL: 0.4000 mg | SUBLINGUAL_TABLET | SUBLINGUAL | Status: DC | PRN

## 2014-11-09 NOTE — Patient Instructions (Addendum)
Medication Instructions:  Your physician recommends that you continue on your current medications as directed. Please refer to the Current Medication list given to you today.    Labwork: Lab work to be done today --BMP  (requested by Dr. Jenny Reichmann)  Testing/Procedures: none  Follow-Up: Your physician wants you to follow-up in: 12 months.  You will receive a reminder letter in the mail two months in advance. If you don't receive a letter, please call our office to schedule the follow-up appointment.

## 2014-11-09 NOTE — Telephone Encounter (Signed)
Per Jonelle Sidle - Dr. Camillia Herter nurse has been advised and will repeat labs STAT and call to office or to MD once results are available

## 2014-11-09 NOTE — Assessment & Plan Note (Signed)
Stable, cont home o2 3L [place] at night only

## 2014-11-09 NOTE — Addendum Note (Signed)
Addended by: Thompson Grayer on: 11/09/2014 04:59 PM   Modules accepted: Orders

## 2014-11-09 NOTE — Telephone Encounter (Signed)
Pt lives in [place] ([place]) with wife who drives him  According to the EPIC, pt is NOW seeing Dr Angelena Form (cardiology)  Please call Dr Angelena Form office to inform of elevated K, and ask to repeat with pt at his lab if possible, so pt does not have to drive again to repeat

## 2014-11-09 NOTE — Patient Instructions (Signed)
Please stop all BC powders  Please continue all other medications as before, and refills have been done if requested.  Please have the pharmacy call with any other refills you may need.  Please continue your efforts at being more active, low cholesterol diet, and weight control.  You are otherwise up to date with prevention measures today.  Please keep your appointments with your specialists as you may have planned  You will be contacted regarding the referral for: Neurology, and Podiatry  Please go to the LAB in the Basement (turn left off the elevator) for the tests to be done today  You will be contacted by phone if any changes need to be made immediately.  Otherwise, you will receive a letter about your results with an explanation, but please check with MyChart first.  Please remember to sign up for MyChart if you have not done so, as this will be important to you in the future with finding out test results, communicating by private email, and scheduling acute appointments online when needed.  Please return in 6 months, or sooner if needed

## 2014-11-09 NOTE — Progress Notes (Signed)
Chief Complaint  Patient presents with  . Chest Pain      History of Present Illness: 74 yo male with history of CAD s/p CABG 1999, HTN, HLD, COPD, ongoing tobacco abuse, DM, GERD, PAD here today for follow up. He has been followed by Dr. [name]. I met him in July 2015. Last cath in August 2005 demonstrated mild LAD disease, patent large OM branch with occluded distal Circumflex filling by vein graft, occluded RCA with occluded vein graft to RCA, RCA filling from left to right collaterals. He has been managed medically since that time by Dr. [name]. He continues to smoke. When I met him in July 2015, he c/o chest pain. I arranged a stress myoview but he cancelled the test.   He is here today for follow up. He has occasional chest pain but only lasts a few seconds. NO changes in breathing. Still smoking 1 ppd. Occasional leg pain. No claudication, rest pain or ulcerations.    Primary Care Physician: Cathlean Cower  Last Lipid Profile:Lipid Panel     Component Value Date/Time   CHOL 129 11/09/2014 1428   TRIG 208.0* 11/09/2014 1428   HDL 42.80 11/09/2014 1428   CHOLHDL 3 11/09/2014 1428   VLDL 41.6* 11/09/2014 1428   LDLCALC 65 12/03/2013 1445    Past Medical History  Diagnosis Date  . COPD (chronic obstructive pulmonary disease)   . Allergy   . Diabetes mellitus   . Emphysema of lung   . GERD (gastroesophageal reflux disease)   . Hypertension   . Hyperlipidemia   . CAD (coronary artery disease)     a. s/p multiple PCIs to RCA and eventual CABG in 1999;  b.LHC 11/2003: CFX 95%, RCA occluded, SVG-RCA occluded, SVG-OM patent, native LAD patent. EF was 55%.  Patient had left to right collaterals and medical therapy was recommended    . Anxiety   . PVD (peripheral vascular disease)   . OA (osteoarthritis)   . Asthma   . Iron deficiency anemia   . Memory loss   . Insomnia   . DDD (degenerative disc disease), lumbar   . OSA (obstructive sleep apnea)   . Depression   . Benign  prostatic hypertrophy   . Lumbar back pain     chronic  . Spinal stenosis   . Herniated disc   . History of colonic polyps   . Diverticulitis, colon   . Esophageal stricture   . Hx of colonoscopy   . Hiatal hernia   . HOH (hard of hearing)     Past Surgical History  Procedure Laterality Date  . Heart bypass    . Bilateral knee replacements    . Hemorrhoid surgery    . Carpal tunnel release    . Shoulder arthroscopy  right  . Coronary artery bypass graft  09/1997    double bypss  . Coronary angioplasty with stent placement  2/99  . Strabismus surgery  08/29/2011    Procedure: REPAIR STRABISMUS;  Surgeon: Derry Skill, MD;  Location: [facility];  Service: Ophthalmology;  Laterality: Left;    Current Outpatient Prescriptions  Medication Sig Dispense Refill  . albuterol (VENTOLIN HFA) 108 (90 BASE) MCG/ACT inhaler Inhale 2 puffs into the lungs every 6 (six) hours as needed for wheezing or shortness of breath. 1 Inhaler 11  . ALPRAZolam (XANAX) 0.25 MG tablet take 1 tablet by mouth three times a day if needed 90 tablet 2  . AMBULATORY NON FORMULARY MEDICATION 2.5  Liters home oxygen continuous    . Aspirin-Salicylamide-Caffeine (BC HEADACHE POWDER PO) Take 1 Package by mouth 4 (four) times daily as needed (headache or pain).    Marland Kitchen donepezil (ARICEPT) 10 MG tablet Take 1 tablet by mouth at bedtime 30 tablet 8  . DULoxetine (CYMBALTA) 60 MG capsule Take 1 capsule (60 mg total) by mouth daily. (Patient taking differently: Take 90 mg by mouth daily. ) 30 capsule 11  . fluticasone (FLONASE) 50 MCG/ACT nasal spray instill 2 sprays into each nostril once daily 16 g 11  . glipiZIDE (GLUCOTROL XL) 2.5 MG 24 hr tablet Take 2.5 mg by mouth daily. Take only if blood sugar is greater than 180    . HYDROcodone-acetaminophen (NORCO) 10-325 MG per tablet Take 1 tablet by mouth 4 (four) times daily.  0  . metFORMIN (GLUCOPHAGE) 500 MG tablet take 2 tablet by mouth twice a day 360 tablet 3  . Multiple  Vitamin (MULTIVITAMIN) capsule Take 1 capsule by mouth daily.     . nitroGLYCERIN (NITROSTAT) 0.4 MG SL tablet Place 1 tablet (0.4 mg total) under the tongue every 5 (five) minutes as needed. For chest pain 25 tablet 6  . omeprazole (PRILOSEC) 20 MG capsule Take 1 capsule (20 mg total) by mouth 2 (two) times daily before a meal. 60 capsule 11  . pravastatin (PRAVACHOL) 80 MG tablet Take 1 tablet (80 mg total) by mouth daily. 90 tablet 3  . zolpidem (AMBIEN) 10 MG tablet Take 10 mg by mouth at bedtime as needed. For sleep     No current facility-administered medications for this visit.    Allergies  Allergen Reactions  . Celecoxib Nausea And Vomiting    REACTION: GI upset  . Chicken Protein Other (See Comments)    Will not eat chicken  . Fish-Derived Products Other (See Comments)    Will not eat seafood of any kind  . Morphine Itching    REACTION: itching  . Nalbuphine Other (See Comments)    REACTION: contraindication with oxycontin.  . Prednisone Other (See Comments)    REACTION: "yeast infections"  . Venlafaxine Other (See Comments)    REACTION: GI upset    History   Social History  . Marital Status: Married    Spouse Name: N/A  . Number of Children: 5  . Years of Education: N/A   Occupational History  . Retired-construction    Social History Main Topics  . Smoking status: Current Every Day Smoker -- 1.00 packs/day for 40 years    Types: Cigarettes  . Smokeless tobacco: Never Used  . Alcohol Use: No  . Drug Use: No  . Sexual Activity: Not on file   Other Topics Concern  . Not on file   Social History Narrative    Family History  Problem Relation Age of Onset  . Stroke Father   . Emphysema Father   . Heart disease    . Hypertension    . Hyperlipidemia    . Kidney cancer Daughter   . Allergies    . Colon cancer Neg Hx   . Diabetes      Review of Systems:  As stated in the HPI and otherwise negative.   BP 86/60 mmHg  Pulse 62  Ht '5\' 6"'  (1.676 m)   Wt 140 lb 12.8 oz (63.866 kg)  BMI 22.74 kg/m2  SpO2 96%  Physical Examination: General: Well developed, well nourished, NAD HEENT: OP clear, mucus membranes moist SKIN: warm, dry. No rashes. Neuro:  No focal deficits Musculoskeletal: Muscle strength 5/5 all ext Psychiatric: Mood and affect normal Neck: No JVD, no carotid bruits, no thyromegaly, no lymphadenopathy. Lungs:Bilateral wheezes. No rhonci, crackles Cardiovascular: Regular rate and rhythm. No murmurs, gallops or rubs. Abdomen:Soft. Bowel sounds present. Non-tender.  Extremities: No lower extremity edema. Pulses are palpable in the bilateral DP/PT.  Cardiac cath August 2005:  The main left coronary was normal.  The left anterior descending artery had some minimal irregularities, coursed  to the apex of the heart where it bifurcated. It was essentially widely  patent with no significant stenosis and good flow throughout. There was  some bend at the junction of the mid portion and the junction of the  distal third of the RCA possibly representing bridging. However, there was  no systolic compromise present and there was good flow throughout.  There was bifurcating moderate size first diagonal before SP1 in the  proximal LAD that was normal. There was a small to moderate size DX2 at the  junction of the proximal third that was normal and there was a small DX3  from the mid LAD that was normal.  The circumflex artery gave off a moderate size long OM-1 that had no  significant stenosis, arose very proximally. The moderate size OM-2 had no  significant stenosis just beyond this and bifurcated. There was 50% smooth  narrowing just beyond this and before the PABG branch. The PABG branch  provided grade 3 collaterals to the distal RCA. There were also collaterals  from the distal LAD to the distal RCA. The PDA and PLA were visualized up  to their distal origin.  The circumflex artery beyond the PABG branch had 95% segmental  stenosis  before the insertion of the graft which was seen on retrograde filling.  The right coronary was totally occluded in its proximal third just before  the previously placed tandem proximal and mid RCA stents. There was no  antegrade flow.  Saphenous vein graft to the RCA was totally occluded in its proximal portion  with a bullet shaped configuration.  The saphenous vein graft to the obtuse marginal was widely patent. There  was a large valve in the proximal third, but there was excellent flow and an  excellent anastomosis to the distal marginal branch. It filled the distal  marginal branches and its branches well antegrade.  EKG:  EKG is ordered today. The ekg ordered today demonstrates NSR, rate 62 bpm. 1st degree AV block.   Recent Labs: 12/03/2013: Hemoglobin 13.7; Platelets 241.0; TSH 1.19 11/09/2014: ALT 10; BUN 28*; Creatinine, Ser 1.45; Potassium 6.3*; Sodium 139   Lipid Panel    Component Value Date/Time   CHOL 129 11/09/2014 1428   TRIG 208.0* 11/09/2014 1428   HDL 42.80 11/09/2014 1428   CHOLHDL 3 11/09/2014 1428   VLDL 41.6* 11/09/2014 1428   LDLCALC 65 12/03/2013 1445   LDLDIRECT 70.0 11/09/2014 1428     Wt Readings from Last 3 Encounters:  11/09/14 140 lb 12.8 oz (63.866 kg)  11/09/14 141 lb (63.957 kg)  05/13/14 142 lb 8 oz (64.638 kg)     Other studies Reviewed: Additional studies/ records that were reviewed today include: . Review of the above records demonstrates:    Assessment and Plan:   1. CAD: s/p CABG in 1999. Last cath 2005. He has been on a statin. He is not taking a daily ASA or beta blocker. He uses BC powders every day. He does not wish to add any medications.  He does not wish to stop smoking. He knows that his CAD will continue to progress while smoking. Will refill NTG.   2. HTN: BP controlled. It is low now but was ok earlier today at primary care visit. No changes.   3. HLD: Lipids controlled. Continue statin.   4. Tobacco abuse,  ongoing: Smoking cessation advised. I spent 10 minutes counseling him to stop. He does not wish to stop smoking. He knows continued smoking will be detrimental to his health.  Of note, we were called at 4:30pm by Dr. Gwynn Burly office asking if we could check a BMET on this patient. Potassium over 6.0 today in their office. Will have him go to our lab for redraw now. Dr. Gwynn Burly office to follow up on lab tonight and advise pt if any action necessary.   Current medicines are reviewed at length with the patient today.  The patient does not have concerns regarding medicines.  The following changes have been made:  no change  Labs/ tests ordered today include:  No orders of the defined types were placed in this encounter.    Disposition:   FU with me in 12  months  Signed, Lauree Chandler, MD 11/09/2014 4:23 PM    [facility] [address], [place], [place]  [address] Phone: [phone]; Fax: [phone]

## 2014-11-09 NOTE — Progress Notes (Signed)
Pre visit review using our clinic review tool, if applicable. No additional management support is needed unless otherwise documented below in the visit note. 

## 2014-11-09 NOTE — Progress Notes (Signed)
Subjective:    Patient ID: Tyler Oh., male    DOB: 06/04/1940, 74 y.o.   MRN: [id number]  HPI  Here to f/u chronic HA's, has been using BC powders regularly for over 40 yrs, now up to 15 per day supplied by wife, with occas GI upset but no recent GI bleed or anemia, on vicodin as well for chronic pain, tends to have what sounds like rebound HA's when misses dosing.  Pt denies chest pain, increased sob or doe, wheezing, orthopnea, PND, increased LE swelling, palpitations, dizziness or syncope, at baseline breathing it seems. Is on 3L  nocturnal only, Pt is continueing to use the home oxygen, pt is benefitting from the use of the home oxygen, and has a portable system and uses it within the home.  Pt denies new neurological symptoms such as new headache, or facial or extremity weakness or numbness, though has been more HOH as of late.   Pt denies fever, wt loss, night sweats, loss of appetite, or other constitutional symptoms  Has not seen neurology for HA, nor podiatry for feet and DM Wt Readings from Last 3 Encounters:  11/09/14 141 lb (63.957 kg)  05/13/14 142 lb 8 oz (64.638 kg)  04/07/14 144 lb 8 oz (65.545 kg)   Past Medical History  Diagnosis Date  . COPD (chronic obstructive pulmonary disease)   . Allergy   . Diabetes mellitus   . Emphysema of lung   . GERD (gastroesophageal reflux disease)   . Hypertension   . Hyperlipidemia   . CAD (coronary artery disease)     a. s/p multiple PCIs to RCA and eventual CABG in 1999;  b.LHC 11/2003: CFX 95%, RCA occluded, SVG-RCA occluded, SVG-OM patent, native LAD patent. EF was 55%.  Patient had left to right collaterals and medical therapy was recommended    . Anxiety   . PVD (peripheral vascular disease)   . OA (osteoarthritis)   . Asthma   . Iron deficiency anemia   . Memory loss   . Insomnia   . DDD (degenerative disc disease), lumbar   . OSA (obstructive sleep apnea)   . Depression   . Benign prostatic hypertrophy   . Lumbar  back pain     chronic  . Spinal stenosis   . Herniated disc   . History of colonic polyps   . Diverticulitis, colon   . Esophageal stricture   . Hx of colonoscopy   . Hiatal hernia   . HOH (hard of hearing)    Past Surgical History  Procedure Laterality Date  . Heart bypass    . Bilateral knee replacements    . Hemorrhoid surgery    . Carpal tunnel release    . Shoulder arthroscopy  right  . Coronary artery bypass graft  09/1997    double bypss  . Coronary angioplasty with stent placement  2/99  . Strabismus surgery  08/29/2011    Procedure: REPAIR STRABISMUS;  Surgeon: Derry Skill, MD;  Location: [facility];  Service: Ophthalmology;  Laterality: Left;    reports that he has been smoking Cigarettes.  He has a 40 pack-year smoking history. He has never used smokeless tobacco. He reports that he does not drink alcohol or use illicit drugs. family history includes Allergies in an other family member; Diabetes in an other family member; Emphysema in his father; Heart disease in an other family member; Hyperlipidemia in an other family member; Hypertension in an other family member; Kidney  cancer in his daughter; Stroke in his father. There is no history of Colon cancer. Allergies  Allergen Reactions  . Celecoxib Nausea And Vomiting    REACTION: GI upset  . Chicken Protein Other (See Comments)    Will not eat chicken  . Fish-Derived Products Other (See Comments)    Will not eat seafood of any kind  . Morphine Itching    REACTION: itching  . Nalbuphine Other (See Comments)    REACTION: contraindication with oxycontin.  . Prednisone Other (See Comments)    REACTION: "yeast infections"  . Venlafaxine Other (See Comments)    REACTION: GI upset   Current Outpatient Prescriptions on File Prior to Visit  Medication Sig Dispense Refill  . albuterol (VENTOLIN HFA) 108 (90 BASE) MCG/ACT inhaler Inhale 2 puffs into the lungs every 6 (six) hours as needed for wheezing or shortness of breath.  1 Inhaler 11  . ALPRAZolam (XANAX) 0.25 MG tablet take 1 tablet by mouth three times a day if needed 90 tablet 2  . AMBULATORY NON FORMULARY MEDICATION 2.5 Liters home oxygen continuous    . Aspirin-Salicylamide-Caffeine (BC HEADACHE POWDER PO) Take 1 Package by mouth 4 (four) times daily as needed (headache or pain).    Marland Kitchen donepezil (ARICEPT) 10 MG tablet Take 1 tablet by mouth at bedtime 30 tablet 8  . DULoxetine (CYMBALTA) 60 MG capsule Take 1 capsule (60 mg total) by mouth daily. (Patient taking differently: Take 90 mg by mouth daily. ) 30 capsule 11  . fluticasone (FLONASE) 50 MCG/ACT nasal spray instill 2 sprays into each nostril once daily 16 g 11  . glipiZIDE (GLUCOTROL XL) 2.5 MG 24 hr tablet Take 2.5 mg by mouth daily. Take only if blood sugar is greater than 180    . metFORMIN (GLUCOPHAGE) 500 MG tablet take 2 tablet by mouth twice a day 360 tablet 3  . Multiple Vitamin (MULTIVITAMIN) capsule Take 1 capsule by mouth daily.     . nitroGLYCERIN (NITROSTAT) 0.4 MG SL tablet Place 1 tablet (0.4 mg total) under the tongue every 5 (five) minutes as needed. For chest pain 25 tablet 6  . omeprazole (PRILOSEC) 20 MG capsule Take 1 capsule (20 mg total) by mouth 2 (two) times daily before a meal. 60 capsule 11  . pravastatin (PRAVACHOL) 80 MG tablet Take 1 tablet (80 mg total) by mouth daily. 90 tablet 3  . zolpidem (AMBIEN) 10 MG tablet Take 10 mg by mouth at bedtime as needed. For sleep    . budesonide (PULMICORT) 0.5 MG/2ML nebulizer solution Take 2 mLs (0.5 mg total) by nebulization 2 (two) times daily. (Patient not taking: Reported on 11/09/2014) 120 mL 12  . docusate sodium (COLACE) 100 MG capsule Take 200 mg by mouth daily.     Marland Kitchen donepezil (ARICEPT) 5 MG tablet take 1 tablet by mouth at bedtime (Patient not taking: Reported on 11/09/2014) 30 tablet 6  . HYDROcodone-homatropine (HYCODAN) 5-1.5 MG/5ML syrup Take 5 mLs by mouth every 6 (six) hours as needed for cough. (Patient not taking: Reported  on 11/09/2014) 180 mL 0  . levofloxacin (LEVAQUIN) 250 MG tablet Take 1 tablet (250 mg total) by mouth daily. (Patient not taking: Reported on 11/09/2014) 10 tablet 0  . methylPREDNISolone (MEDROL, PAK,) 4 MG tablet follow package directions (Patient not taking: Reported on 11/09/2014) 21 tablet 0  . OxyCODONE (OXYCONTIN) 40 mg T12A 12 hr tablet Take 40 mg by mouth 3 (three) times daily.    Marland Kitchen oxycodone (ROXICODONE) 30  MG immediate release tablet Take 30 mg by mouth every 4 (four) hours as needed for pain.    Marland Kitchen zolpidem (AMBIEN) 5 MG tablet Take 1 tablet (5 mg total) by mouth at bedtime as needed for sleep. (Patient not taking: Reported on 11/09/2014) 30 tablet 5   No current facility-administered medications on file prior to visit.    Review of Systems  Constitutional: Negative for unusual diaphoresis or night sweats HENT: Negative for ringing in ear or discharge Eyes: Negative for double vision or worsening visual disturbance.  Respiratory: Negative for choking and stridor.   Gastrointestinal: Negative for vomiting or other signifcant bowel change Genitourinary: Negative for hematuria or change in urine volume.  Musculoskeletal: Negative for other MSK pain or swelling Skin: Negative for color change and worsening wound.  Neurological: Negative for tremors and numbness other than noted  Psychiatric/Behavioral: Negative for decreased concentration or agitation other than above       Objective:   Physical Exam BP 124/72 mmHg  Pulse 63  Temp(Src) 97.7 F (36.5 C) (Oral)  Ht 5\' 6"  (1.676 m)  Wt 141 lb (63.957 kg)  BMI 22.77 kg/m2  SpO2 95% VS noted,  Constitutional: Pt appears in no significant distress HENT: Head: NCAT.  Right Ear: External ear normal.  Left Ear: External ear normal.  Eyes: . Pupils are equal, round, and reactive to light. Conjunctivae and EOM are normal Neck: Normal range of motion. Neck supple.  Cardiovascular: Normal rate and regular rhythm.   Pulmonary/Chest: Effort  normal and breath sounds decreased without rales or wheezing.  Abd:  Soft, NT, ND, + BS Neurological: Pt is alert. Not confused , motor grossly intact Skin: Skin is warm. No rash, no LE edema Psychiatric: Pt behavior is normal. No agitation.     Assessment & Plan:

## 2014-11-09 NOTE — Telephone Encounter (Signed)
Critical Lab Value - Potassium 6.3

## 2014-11-09 NOTE — Assessment & Plan Note (Signed)
C/w rebound, pt to simplly stop all BC powders, will refer Dr Lewit/neurology for HA as well

## 2014-11-09 NOTE — Assessment & Plan Note (Signed)
stable overall by history and exam, recent data reviewed with pt, and pt to continue medical treatment as before,  to f/u any worsening symptoms or concerns SpO2 Readings from Last 3 Encounters:  11/09/14 95%  05/13/14 93%  04/07/14 92%

## 2014-11-09 NOTE — Assessment & Plan Note (Signed)
stable overall by history and exam, recent data reviewed with pt, and pt to continue medical treatment as before,  to f/u any worsening symptoms or concerns. For labs, and refer podiatry

## 2014-11-14 DIAGNOSIS — F411 Generalized anxiety disorder: Secondary | ICD-10-CM | POA: Diagnosis not present

## 2014-11-14 DIAGNOSIS — F0281 Dementia in other diseases classified elsewhere with behavioral disturbance: Secondary | ICD-10-CM | POA: Diagnosis not present

## 2014-11-14 DIAGNOSIS — F332 Major depressive disorder, recurrent severe without psychotic features: Secondary | ICD-10-CM | POA: Diagnosis not present

## 2014-11-17 ENCOUNTER — Encounter: Payer: Self-pay | Admitting: Internal Medicine

## 2014-11-22 DIAGNOSIS — M546 Pain in thoracic spine: Secondary | ICD-10-CM | POA: Diagnosis not present

## 2014-11-22 DIAGNOSIS — G890 Central pain syndrome: Secondary | ICD-10-CM | POA: Diagnosis not present

## 2014-11-22 DIAGNOSIS — M545 Low back pain: Secondary | ICD-10-CM | POA: Diagnosis not present

## 2014-11-22 DIAGNOSIS — M542 Cervicalgia: Secondary | ICD-10-CM | POA: Diagnosis not present

## 2014-11-25 ENCOUNTER — Ambulatory Visit (INDEPENDENT_AMBULATORY_CARE_PROVIDER_SITE_OTHER): Payer: Medicare Other | Admitting: Podiatry

## 2014-11-25 ENCOUNTER — Encounter: Payer: Self-pay | Admitting: Podiatry

## 2014-11-25 VITALS — BP 121/61 | HR 43 | Ht 66.0 in | Wt 140.0 lb

## 2014-11-25 DIAGNOSIS — B351 Tinea unguium: Secondary | ICD-10-CM

## 2014-11-25 DIAGNOSIS — B353 Tinea pedis: Secondary | ICD-10-CM

## 2014-11-25 DIAGNOSIS — M79606 Pain in leg, unspecified: Secondary | ICD-10-CM

## 2014-11-25 MED ORDER — CLOTRIMAZOLE-BETAMETHASONE 1-0.05 % EX CREA: 1.0000 "application " | TOPICAL_CREAM | Freq: Two times a day (BID) | CUTANEOUS | Status: DC

## 2014-11-25 NOTE — Progress Notes (Signed)
SUBJECTIVE: 74 y.o. year old male presents accompanied by his wife for diabetic foot care. Patient is ambulatory without assistance.  Stated that he is diabetic and blood sugar is under control. His feet gets dry and scaly. Nails are thick and deformed.  Both feet hurts in the morning when getting out of bed.   OBJECTIVE: DERMATOLOGIC EXAMINATION: Nails: Thick, yellow, and dystrophic nails x 10.  Both feet are scaly and has peeling skin on dorsum and plantar. VASCULAR EXAMINATION OF LOWER LIMBS: Pedal pulses: DP and PT are palpable on right foot. Not palpable on left foot. No signs of ischemic change in left foot noted.  NEUROLOGIC EXAMINATION OF THE LOWER LIMBS: Achilles DTR is present and within normal. Monofilament (Semmes-Weinstein 10-gm) sensory testing positive 6 out of 6, bilateral. Sharp and Dull discriminatory sensations at the plantar ball of hallux is intact bilateral.  MUSCULOSKELETAL EXAMINATION: High arch foot with nuks forefoot varus when forefoot is loaded.  ASSESSMENT: Tinea pedis both feet. Mycotic nails x 10.  Morning stiffness in both foot and ankle due to faulty biomechanics.   PLAN: Reviewed findings and available treatment options.  All nails debrided. Vinegar soak and Lotrsone cream prescribed. Advised to keep socks off at home and wear open sandals.  Return in 3 month for RFC or sooner if needed.

## 2014-11-25 NOTE — Patient Instructions (Signed)
Diabetic foot care.  Debrided all nails. Do Vinegar soak 2-3 times/week. Use Lotrisone cream for itch peeling skin. Return in 3 months.

## 2014-11-26 ENCOUNTER — Other Ambulatory Visit: Payer: Self-pay | Admitting: Internal Medicine

## 2014-11-27 ENCOUNTER — Other Ambulatory Visit: Payer: Self-pay | Admitting: Physician Assistant

## 2014-11-30 DIAGNOSIS — Z23 Encounter for immunization: Secondary | ICD-10-CM | POA: Diagnosis not present

## 2014-12-01 DIAGNOSIS — F332 Major depressive disorder, recurrent severe without psychotic features: Secondary | ICD-10-CM | POA: Diagnosis not present

## 2014-12-19 DIAGNOSIS — M25562 Pain in left knee: Secondary | ICD-10-CM | POA: Diagnosis not present

## 2014-12-19 DIAGNOSIS — M545 Low back pain: Secondary | ICD-10-CM | POA: Diagnosis not present

## 2014-12-19 DIAGNOSIS — M25511 Pain in right shoulder: Secondary | ICD-10-CM | POA: Diagnosis not present

## 2014-12-19 DIAGNOSIS — Z79891 Long term (current) use of opiate analgesic: Secondary | ICD-10-CM | POA: Diagnosis not present

## 2014-12-19 DIAGNOSIS — M5440 Lumbago with sciatica, unspecified side: Secondary | ICD-10-CM | POA: Diagnosis not present

## 2014-12-19 DIAGNOSIS — G8929 Other chronic pain: Secondary | ICD-10-CM | POA: Diagnosis not present

## 2014-12-19 DIAGNOSIS — G890 Central pain syndrome: Secondary | ICD-10-CM | POA: Diagnosis not present

## 2014-12-19 DIAGNOSIS — M25561 Pain in right knee: Secondary | ICD-10-CM | POA: Diagnosis not present

## 2015-01-04 DIAGNOSIS — F332 Major depressive disorder, recurrent severe without psychotic features: Secondary | ICD-10-CM | POA: Diagnosis not present

## 2015-01-04 DIAGNOSIS — F0281 Dementia in other diseases classified elsewhere with behavioral disturbance: Secondary | ICD-10-CM | POA: Diagnosis not present

## 2015-01-04 DIAGNOSIS — F411 Generalized anxiety disorder: Secondary | ICD-10-CM | POA: Diagnosis not present

## 2015-01-16 DIAGNOSIS — G890 Central pain syndrome: Secondary | ICD-10-CM | POA: Diagnosis not present

## 2015-01-16 DIAGNOSIS — M25562 Pain in left knee: Secondary | ICD-10-CM | POA: Diagnosis not present

## 2015-01-16 DIAGNOSIS — G603 Idiopathic progressive neuropathy: Secondary | ICD-10-CM | POA: Diagnosis not present

## 2015-01-16 DIAGNOSIS — M25561 Pain in right knee: Secondary | ICD-10-CM | POA: Diagnosis not present

## 2015-01-16 DIAGNOSIS — G8929 Other chronic pain: Secondary | ICD-10-CM | POA: Diagnosis not present

## 2015-01-16 DIAGNOSIS — M25512 Pain in left shoulder: Secondary | ICD-10-CM | POA: Diagnosis not present

## 2015-01-16 DIAGNOSIS — Z79891 Long term (current) use of opiate analgesic: Secondary | ICD-10-CM | POA: Diagnosis not present

## 2015-01-16 DIAGNOSIS — M25511 Pain in right shoulder: Secondary | ICD-10-CM | POA: Diagnosis not present

## 2015-01-18 ENCOUNTER — Encounter: Payer: Self-pay | Admitting: Internal Medicine

## 2015-01-18 ENCOUNTER — Ambulatory Visit (INDEPENDENT_AMBULATORY_CARE_PROVIDER_SITE_OTHER): Payer: Medicare Other | Admitting: Internal Medicine

## 2015-01-18 VITALS — BP 100/50 | HR 55 | Temp 97.6°F | Resp 18 | Wt 143.8 lb

## 2015-01-18 DIAGNOSIS — J029 Acute pharyngitis, unspecified: Secondary | ICD-10-CM

## 2015-01-18 DIAGNOSIS — J438 Other emphysema: Secondary | ICD-10-CM

## 2015-01-18 DIAGNOSIS — F17200 Nicotine dependence, unspecified, uncomplicated: Secondary | ICD-10-CM

## 2015-01-18 DIAGNOSIS — J310 Chronic rhinitis: Secondary | ICD-10-CM | POA: Diagnosis not present

## 2015-01-18 DIAGNOSIS — Z720 Tobacco use: Secondary | ICD-10-CM

## 2015-01-18 DIAGNOSIS — I2510 Atherosclerotic heart disease of native coronary artery without angina pectoris: Secondary | ICD-10-CM

## 2015-01-18 MED ORDER — AMOXICILLIN 500 MG PO CAPS: 500.0000 mg | ORAL_CAPSULE | Freq: Three times a day (TID) | ORAL | Status: DC

## 2015-01-18 NOTE — Progress Notes (Signed)
Pre visit review using our clinic review tool, if applicable. No additional management support is needed unless otherwise documented below in the visit note. 

## 2015-01-18 NOTE — Patient Instructions (Signed)
Plain Mucinex (NOT D) for thick secretions ;force NON dairy fluids .   Nasal cleansing in the shower as discussed with lather of mild shampoo.After 10 seconds wash off lather while  exhaling through nostrils. Make sure that all residual soap is removed to prevent irritation.  Flonase OR Nasacort AQ 1 spray in each nostril twice a day as needed. Use the "crossover" technique into opposite nostril spraying toward opposite ear @ 45 degree angle, not straight up into nostril.  Plain Allegra (NOT D )  160 daily , Loratidine 10 mg , OR Zyrtec 10 mg @ bedtime  as needed for itchy eyes & sneezing. Zicam Melts or Zinc lozenges as per package label for sore throat .  Fill the  prescription for antibiotic if no better in the next 48-72 hours.

## 2015-01-18 NOTE — Progress Notes (Signed)
   Subjective:    Patient ID: Tyler Oh., male    DOB: 1940/05/29, 74 y.o.   MRN: [id number]  HPI He describes a sore throat for 1 month. This is in the context of chronic rhinorrhea which has been present for years. He has associated nasal congestion and frontal headache. He has intermittent cough with clear/yellow sputum. He also has itchy, watery eyes. His mouth stays dry.  The sore throat is mainly with swallowing. There is no associated dysphagia  Dyspnea is chronic related to his smoking one & one half-2 packs per day. He states this is down from 4 packs a day. He does not use nonsteroidals or drink alcohol. He drinks 2 cups a day of coffee.  Review of Systems  He denies significant heartburn at this time or other GI symptoms.  He's not having fever, chills, or sweats.  He has no purulent nasal secretions.     Objective:   Physical Exam There is an overpowering tobacco odor in the room. He appears chronically malnourished. He has a small mustache. Pattern alopecia is noted. He is edentulous. There are no exudates in posterior pharynx. He has severe erythema of the nasal mucosa especially on the right. There is minor punctate hemorrhage in the left inferior canal. By history he uses a bobby pin to clean his ears. He has a slow irregular distant heart rhythm. He has diffuse inspiratory pops and wheezes in all lung fields. Clubbing of the nailbeds is noted. He has profound nicotine staining of multiple fingers  General appearance:in no acute distress or increased work of breathing is present surprisingly  Lymphatic: No  lymphadenopathy about the head, neck, or axilla .  Eyes: No conjunctival inflammation or lid edema is present. There is no scleral icterus.  Ears:  External ear exam shows no significant lesions or deformities.    Nose:  External nasal examination shows no deformity or inflammation.   Neck:  No deformities, thyromegaly, masses, or tenderness noted.      Heart:  No gallop, murmur, click, rub or other extra sounds.   Extremities:  No cyanosis, edema noted    Skin: Warm & dry w/o tenting or jaundice. No significant lesions or rash.     Assessment & Plan:  #1 pharyngitis in the context of profound rhinitis   #2 advanced COPD with an asthmatic component  #3 nicotine addiction with no interest in stopping smoking  Plan: See orders and after visit summary.ENT referral for direct laryngoscopy if symptoms persist to rule out upper airway malignancy in view of incredible smoking history.

## 2015-01-19 DIAGNOSIS — F331 Major depressive disorder, recurrent, moderate: Secondary | ICD-10-CM | POA: Diagnosis not present

## 2015-01-23 ENCOUNTER — Other Ambulatory Visit: Payer: Self-pay | Admitting: Internal Medicine

## 2015-02-03 ENCOUNTER — Other Ambulatory Visit: Payer: Self-pay | Admitting: Internal Medicine

## 2015-02-07 NOTE — Telephone Encounter (Signed)
Done hardcopy to Dahlia  

## 2015-02-07 NOTE — Telephone Encounter (Signed)
Rx faxed to pharmacy  

## 2015-02-16 DIAGNOSIS — M545 Low back pain: Secondary | ICD-10-CM | POA: Diagnosis not present

## 2015-02-16 DIAGNOSIS — G8929 Other chronic pain: Secondary | ICD-10-CM | POA: Diagnosis not present

## 2015-02-22 ENCOUNTER — Ambulatory Visit: Payer: Medicare Other | Admitting: Podiatry

## 2015-02-24 ENCOUNTER — Ambulatory Visit: Payer: Medicare Other | Admitting: Podiatry

## 2015-02-27 DIAGNOSIS — H50012 Monocular esotropia, left eye: Secondary | ICD-10-CM | POA: Diagnosis not present

## 2015-02-27 DIAGNOSIS — H532 Diplopia: Secondary | ICD-10-CM | POA: Diagnosis not present

## 2015-03-07 ENCOUNTER — Other Ambulatory Visit: Payer: Self-pay | Admitting: Internal Medicine

## 2015-03-09 DIAGNOSIS — G4441 Drug-induced headache, not elsewhere classified, intractable: Secondary | ICD-10-CM | POA: Diagnosis not present

## 2015-03-09 DIAGNOSIS — G43719 Chronic migraine without aura, intractable, without status migrainosus: Secondary | ICD-10-CM | POA: Diagnosis not present

## 2015-03-09 DIAGNOSIS — G44221 Chronic tension-type headache, intractable: Secondary | ICD-10-CM | POA: Diagnosis not present

## 2015-03-16 DIAGNOSIS — M545 Low back pain: Secondary | ICD-10-CM | POA: Diagnosis not present

## 2015-03-16 DIAGNOSIS — G8929 Other chronic pain: Secondary | ICD-10-CM | POA: Diagnosis not present

## 2015-03-20 DIAGNOSIS — F0281 Dementia in other diseases classified elsewhere with behavioral disturbance: Secondary | ICD-10-CM | POA: Diagnosis not present

## 2015-03-20 DIAGNOSIS — F332 Major depressive disorder, recurrent severe without psychotic features: Secondary | ICD-10-CM | POA: Diagnosis not present

## 2015-03-20 DIAGNOSIS — F411 Generalized anxiety disorder: Secondary | ICD-10-CM | POA: Diagnosis not present

## 2015-04-04 ENCOUNTER — Encounter: Payer: Self-pay | Admitting: Osteopathic Medicine

## 2015-04-04 ENCOUNTER — Ambulatory Visit (INDEPENDENT_AMBULATORY_CARE_PROVIDER_SITE_OTHER): Payer: Medicare Other | Admitting: Osteopathic Medicine

## 2015-04-04 VITALS — BP 145/72 | HR 45 | Ht 66.0 in | Wt 140.0 lb

## 2015-04-04 DIAGNOSIS — I25709 Atherosclerosis of coronary artery bypass graft(s), unspecified, with unspecified angina pectoris: Secondary | ICD-10-CM

## 2015-04-04 DIAGNOSIS — R413 Other amnesia: Secondary | ICD-10-CM | POA: Diagnosis not present

## 2015-04-04 DIAGNOSIS — M1990 Unspecified osteoarthritis, unspecified site: Secondary | ICD-10-CM | POA: Diagnosis not present

## 2015-04-04 DIAGNOSIS — F411 Generalized anxiety disorder: Secondary | ICD-10-CM

## 2015-04-04 DIAGNOSIS — Z9181 History of falling: Secondary | ICD-10-CM

## 2015-04-04 DIAGNOSIS — J449 Chronic obstructive pulmonary disease, unspecified: Secondary | ICD-10-CM

## 2015-04-04 DIAGNOSIS — G4700 Insomnia, unspecified: Secondary | ICD-10-CM

## 2015-04-04 DIAGNOSIS — M899 Disorder of bone, unspecified: Secondary | ICD-10-CM

## 2015-04-04 DIAGNOSIS — E1159 Type 2 diabetes mellitus with other circulatory complications: Secondary | ICD-10-CM

## 2015-04-04 DIAGNOSIS — M949 Disorder of cartilage, unspecified: Secondary | ICD-10-CM

## 2015-04-04 NOTE — Progress Notes (Signed)
HPI: Tyler BALSTER Sr. is a 74 y.o. male who presents to [facility] today for chief complaint of:  Chief Complaint  Patient presents with  . Establish Care    Medicines and Chronic Medical Problems Reviewed:  GERD - controlled on Prilosec.   DIABETES - on Glipizide if sugars over 180, takes Metformin 500mg  2 pills bid  COPD ON O2 - Uses O2 at night but probably needs this all the time. On Albuterol prn, only inhaler. He was tried on soemthing else previously (thinks Symbicort) but this makes him really jittery so he stopped, the Albuterol doesn't seem to make him jittery, uses about once or twice per day. PFT he thinks done several years ago. Previously seen by  CAD S/P CABG - sees Cardiology annually (Dr Jessee Avers). Uses Nitro on occasion. On pravastatin. Was on Lisinopril few years ago, "took him off everything except pravastatin." Not sure why took him off these. On BC Powder rather than ASA  ANXIETY/INSOMNIA - Takes Ambien every night, on Aricept for memory problems, Alprazolam once per day for anxiety usually in the evenings, Lamictal for mood problems (one in morning, 2 at night)  HYPERTENSION - see below for vitals, controlled.   ARTHRITIS - on Vicodin 5 per day   OSA - no CPAP, only on nasal O2  Doesn't need refills today.     Past medical, social and family history reviewed: Past Medical History  Diagnosis Date  . COPD (chronic obstructive pulmonary disease) ([place])   . Allergy   . Diabetes mellitus   . Emphysema of lung ([place])   . GERD (gastroesophageal reflux disease)   . Hypertension   . Hyperlipidemia   . CAD (coronary artery disease)     a. s/p multiple PCIs to RCA and eventual CABG in 1999;  b.LHC 11/2003: CFX 95%, RCA occluded, SVG-RCA occluded, SVG-OM patent, native LAD patent. EF was 55%.  Patient had left to right collaterals and medical therapy was recommended    . Anxiety   . PVD (peripheral vascular disease) ([place])    . OA (osteoarthritis)   . Asthma   . Iron deficiency anemia   . Memory loss   . Insomnia   . DDD (degenerative disc disease), lumbar   . OSA (obstructive sleep apnea)   . Depression   . Benign prostatic hypertrophy   . Lumbar back pain     chronic  . Spinal stenosis   . Herniated disc   . History of colonic polyps   . Diverticulitis, colon   . Esophageal stricture   . Hx of colonoscopy   . Hiatal hernia   . HOH (hard of hearing)    Past Surgical History  Procedure Laterality Date  . Heart bypass    . Bilateral knee replacements    . Hemorrhoid surgery    . Carpal tunnel release    . Shoulder arthroscopy  right  . Coronary artery bypass graft  09/1997    double bypss  . Coronary angioplasty with stent placement  2/99  . Strabismus surgery  08/29/2011    Procedure: REPAIR STRABISMUS;  Surgeon: Derry Skill, MD;  Location: [facility];  Service: Ophthalmology;  Laterality: Left;   Social History  Substance Use Topics  . Smoking status: Current Every Day Smoker -- 1.00 packs/day for 40 years    Types: Cigarettes  . Smokeless tobacco: Never Used  . Alcohol Use: No   Family History  Problem Relation Age of Onset  .  Stroke Father   . Emphysema Father   . Heart disease    . Hypertension    . Hyperlipidemia    . Kidney cancer Daughter   . Allergies    . Colon cancer Neg Hx   . Diabetes      Current Outpatient Prescriptions  Medication Sig Dispense Refill  . albuterol (VENTOLIN HFA) 108 (90 BASE) MCG/ACT inhaler Inhale 2 puffs into the lungs every 6 (six) hours as needed for wheezing or shortness of breath. 1 Inhaler 11  . ALPRAZolam (XANAX) 0.25 MG tablet TAKE 1 TABLET BY MOUTH THREE TIMES A DAY IF NEEDED 90 tablet 2  . AMBULATORY NON FORMULARY MEDICATION 2.5 Liters home oxygen continuous    . Aspirin-Salicylamide-Caffeine (BC HEADACHE POWDER PO) Take 1 Package by mouth 4 (four) times daily as needed (headache or pain).    Marland Kitchen donepezil (ARICEPT) 10 MG tablet take 1 tablet  by mouth at bedtime 30 tablet 5  . DULoxetine (CYMBALTA) 30 MG capsule   0  . DULoxetine (CYMBALTA) 60 MG capsule Take 1 capsule (60 mg total) by mouth daily. (Patient taking differently: Take 90 mg by mouth daily. ) 30 capsule 11  . fluticasone (FLONASE) 50 MCG/ACT nasal spray instill 2 sprays into each nostril once daily 16 g 11  . glipiZIDE (GLUCOTROL XL) 2.5 MG 24 hr tablet Take 2.5 mg by mouth daily. Take only if blood sugar is greater than 180    . lamoTRIgine (LAMICTAL) 25 MG tablet take 1 tablet at bedtime for 14 days then 1 tablet twice a day  0  . metFORMIN (GLUCOPHAGE) 500 MG tablet TAKE 2 TABLETS BY MOUTH TWICE A DAY 360 tablet 1  . Multiple Vitamin (MULTIVITAMIN) capsule Take 1 capsule by mouth daily.     . nitroGLYCERIN (NITROSTAT) 0.4 MG SL tablet Place 1 tablet (0.4 mg total) under the tongue every 5 (five) minutes as needed. For chest pain 25 tablet 6  . omeprazole (PRILOSEC) 20 MG capsule take 1 capsule by mouth twice a day before meals 60 capsule 11  . pravastatin (PRAVACHOL) 80 MG tablet take 1 tablet by mouth once daily 90 tablet 3  . zolpidem (AMBIEN) 10 MG tablet Take 10 mg by mouth at bedtime as needed. For sleep     No current facility-administered medications for this visit.   Allergies  Allergen Reactions  . Celecoxib Nausea And Vomiting    REACTION: GI upset  . Chicken Protein Other (See Comments)    Will not eat chicken  . Fish-Derived Products Other (See Comments)    Will not eat seafood of any kind  . Morphine Itching    REACTION: itching  . Nalbuphine Other (See Comments)    REACTION: contraindication with oxycontin.  . Prednisone Other (See Comments)    REACTION: "yeast infections"  . Venlafaxine Other (See Comments)    REACTION: GI upset      Review of Systems: CONSTITUTIONAL:  No  fever, no chills, No  unintentional weight changes HEAD/EYES/EARS/NOSE/THROAT: Yes  headache, no recent vision change, no hearing change, No  sore throat, No  sinus  pressure CARDIAC: intermittent chest pain, No  pressure, No palpitations, No  orthopnea RESPIRATORY: Yes  cough, Yes  shortness of breath/wheeze GASTROINTESTINAL: No  nausea, No  vomiting, No  abdominal pain, No  blood in stool, No  diarrhea, No  constipation  MUSCULOSKELETAL: Yes  myalgia/arthralgia GENITOURINARY: No  incontinence, No  abnormal genital bleeding/discharge SKIN: No  rash/wounds/concerning lesions HEM/ONC: No  easy bruising/bleeding, No  abnormal lymph node ENDOCRINE: No  polyuria/polydipsia/polyphagia, No  heat/cold intolerance  NEUROLOGIC: Yes  weakness, No  dizziness, No  slurred speech PSYCHIATRIC: Yes concerns with depression, Yes  concerns with anxiety, Yes sleep problems    Exam:  BP 145/72 mmHg  Pulse 45  Ht 5\' 6"  (1.676 m)  Wt 140 lb (63.504 kg)  BMI 22.61 kg/m2  SpO2 93% Constitutional: VS see above. General Appearance: alert, well-developed, well-nourished, NAD Eyes: Normal lids and conjunctive, non-icteric sclera Respiratory: Labored respiratory effort. (+) diffuse wheeze, no rhonchi Cardiovascular: S1/S2 normal, no murmur, no rub/gallop auscultated. RRR. No lower extremity edema. Gastrointestinal: Nontender Musculoskeletal: Gait antalgic, ambulates with cane. No clubbing/cyanosis of digits.  Neurological: No cranial nerve deficit on limited exam. Motor and sensation intact and symmetric Skin: warm, dry, intact. No rash/ulcer. No concerning nevi or subq nodules on limited exam.   Psychiatric: Normal judgment/insight. Normal mood and affect. Pleasant.    No results found for this or any previous visit (from the past 72 hour(s)).  Oxygen saturation 91% at rest, patient unable to tolerate 6 minute walk as he did walk for approximately 1 minute and SaO2 did not drop below 91%  ASSESSMENT/PLAN:  Atherosclerosis of coronary artery bypass graft of native heart with unspecified angina pectoris - Patient on suboptimal statin therapy, not on aspirin but is taking  Goody powders instead, no ACE inhibitor or beta blocker. Need to review cardiology records - Plan: CBC with Differential/Platelet, COMPLETE METABOLIC PANEL WITH GFR, Lipid panel  Osteoarthritis, unspecified osteoarthritis type, unspecified site - Patient on chronic opiate therapy and ambulates with cane  Insomnia - He has been taking Xanax and Ambien along with opiate therapy for pain, patient advised December 2016 to discontinue these meds, can explore other treatment  - Plan: TSH  Memory problem - Patient and wife tonight official diagnosis of dementia, patient on Aricept and doing well  Chronic obstructive pulmonary disease, unspecified COPD type ([place]) - Pulmonary function tests pending, patient on when necessary albuterol, escalate inhaled therapy based on PFT results  Type 2 diabetes mellitus with other circulatory complication ([place]) - Plan: Hemoglobin A1c, Urine Microalbumin w/creat. ratio  Risk for falls - Benzodiazepine & hypnotic therapy discontinued 03/2015, pt may benefit from DEXA, vitamin D  Disorder of bone and cartilage - Plan: VITAMIN D 25 Hydroxy (Vit-D Deficiency, Fractures)  Anxiety state - Xanax discontinued 03/2015, he is still taking Cymbalta and Lamictal  DMV papers filled out for patient to maintain handicap parking permit, left at front desk for when patient comes back to get lab work done he can pick it up   Return in about 1 week (around 04/11/2015), or if symptoms worsen or fail to improve, for BREATHING TESTS AND GO OVER LABS (30 MINUTE VISIT).  Total time spent 45 minutes, greater than 50% of the visit was counseling and coordinating care for diagnosis of see diagnosis list.

## 2015-04-04 NOTE — Patient Instructions (Signed)
IT IS NOT SAFE FOR YOU TO BE TAKING OPIATE PAIN MEDICATIONS (VICODIN) PLUS OTHER SEDATING MEDICINES SUCH AS ALPRAZOLAM AND AMBIEN. I WOULD LIKE YOU TO STOP THE ALPRAZOLAM AND THE AMBIEN, I WILL NOT BE CONTINUING TO PRESCRIBE THESE MEDICINES FOR YOU DUE TO THE RISK OF SEDATION, BREATHING PROBLEMS, DELIRIUM AND FALLS IN SOMEONE WITH YOUR MEDICAL PROBLEMS. IF TROUBLE SLEEPING IS SOMETHING WE NEED TO ADDRESS, WE CAN TALK ABOUT OTHER TREATMENTS FOR THAT PROBLEM.   I WILL NEED RECORDS FORM YOUR CARDIOLOGIST. FOR PATIENTS WHO HAVE HAD A BYPASS SURGERY, YOU SHOULD BE TAKING A FEW MEDICINES TO HELP PROTECT YOUR HEART, BUT I DON'T SEE THESE MEDICINES ON YOUR RECORD.   I'D LIKE YOU TO COME BACK TO THE OFFICE FOR FORMAL BREATHING TESTS. I THINK THAT A DAILY INHALER WOULD HELP YOUR BREATHING. WE WILL WORK ON GETTING YOU 24 HOUR OXYGEN.

## 2015-04-05 DIAGNOSIS — R413 Other amnesia: Secondary | ICD-10-CM | POA: Insufficient documentation

## 2015-04-05 DIAGNOSIS — G4700 Insomnia, unspecified: Secondary | ICD-10-CM | POA: Insufficient documentation

## 2015-04-05 DIAGNOSIS — Z9181 History of falling: Secondary | ICD-10-CM | POA: Insufficient documentation

## 2015-04-05 DIAGNOSIS — E1159 Type 2 diabetes mellitus with other circulatory complications: Secondary | ICD-10-CM | POA: Insufficient documentation

## 2015-04-06 ENCOUNTER — Encounter: Payer: Self-pay | Admitting: *Deleted

## 2015-04-06 DIAGNOSIS — M899 Disorder of bone, unspecified: Secondary | ICD-10-CM | POA: Diagnosis not present

## 2015-04-06 DIAGNOSIS — G4700 Insomnia, unspecified: Secondary | ICD-10-CM | POA: Diagnosis not present

## 2015-04-06 DIAGNOSIS — I209 Angina pectoris, unspecified: Secondary | ICD-10-CM | POA: Diagnosis not present

## 2015-04-06 DIAGNOSIS — M949 Disorder of cartilage, unspecified: Secondary | ICD-10-CM | POA: Diagnosis not present

## 2015-04-06 DIAGNOSIS — I25709 Atherosclerosis of coronary artery bypass graft(s), unspecified, with unspecified angina pectoris: Secondary | ICD-10-CM | POA: Diagnosis not present

## 2015-04-06 DIAGNOSIS — E1159 Type 2 diabetes mellitus with other circulatory complications: Secondary | ICD-10-CM | POA: Diagnosis not present

## 2015-04-06 LAB — CBC WITH DIFFERENTIAL/PLATELET
BASOS ABS: 0.1 10*3/uL (ref 0.0–0.1)
BASOS PCT: 1 % (ref 0–1)
EOS ABS: 0.1 10*3/uL (ref 0.0–0.7)
EOS PCT: 2 % (ref 0–5)
HEMATOCRIT: 36 % — AB (ref 39.0–52.0)
Hemoglobin: 11.8 g/dL — ABNORMAL LOW (ref 13.0–17.0)
LYMPHS PCT: 31 % (ref 12–46)
Lymphs Abs: 1.9 10*3/uL (ref 0.7–4.0)
MCH: 31.6 pg (ref 26.0–34.0)
MCHC: 32.8 g/dL (ref 30.0–36.0)
MCV: 96.3 fL (ref 78.0–100.0)
MONO ABS: 0.5 10*3/uL (ref 0.1–1.0)
MPV: 8.7 fL (ref 8.6–12.4)
Monocytes Relative: 8 % (ref 3–12)
NEUTROS ABS: 3.5 10*3/uL (ref 1.7–7.7)
NEUTROS PCT: 58 % (ref 43–77)
PLATELETS: 435 10*3/uL — AB (ref 150–400)
RBC: 3.74 MIL/uL — AB (ref 4.22–5.81)
RDW: 15.2 % (ref 11.5–15.5)
WBC: 6.1 10*3/uL (ref 4.0–10.5)

## 2015-04-07 LAB — COMPLETE METABOLIC PANEL WITH GFR
ALT: 7 U/L — ABNORMAL LOW (ref 9–46)
AST: 13 U/L (ref 10–35)
Albumin: 4.2 g/dL (ref 3.6–5.1)
Alkaline Phosphatase: 74 U/L (ref 40–115)
BUN: 24 mg/dL (ref 7–25)
CALCIUM: 9.5 mg/dL (ref 8.6–10.3)
CHLORIDE: 108 mmol/L (ref 98–110)
CO2: 22 mmol/L (ref 20–31)
Creat: 1.52 mg/dL — ABNORMAL HIGH (ref 0.70–1.18)
GFR, EST AFRICAN AMERICAN: 52 mL/min — AB (ref >=60)
GFR, Est Non African American: 45 mL/min — ABNORMAL LOW (ref >=60)
Glucose, Bld: 125 mg/dL — ABNORMAL HIGH (ref 65–99)
POTASSIUM: 5.7 mmol/L — AB (ref 3.5–5.3)
Sodium: 141 mmol/L (ref 135–146)
Total Bilirubin: 0.2 mg/dL (ref 0.2–1.2)
Total Protein: 7.3 g/dL (ref 6.1–8.1)

## 2015-04-07 LAB — LIPID PANEL
CHOL/HDL RATIO: 4.1 ratio (ref <=5.0)
Cholesterol: 127 mg/dL (ref 125–200)
HDL: 31 mg/dL — AB (ref >=40)
LDL Cholesterol: 58 mg/dL (ref <130)
TRIGLYCERIDES: 190 mg/dL — AB (ref <150)
VLDL: 38 mg/dL — ABNORMAL HIGH (ref <30)

## 2015-04-07 LAB — VITAMIN D 25 HYDROXY (VIT D DEFICIENCY, FRACTURES): Vit D, 25-Hydroxy: 34 ng/mL (ref 30–100)

## 2015-04-07 LAB — TSH: TSH: 1.176 u[IU]/mL (ref 0.350–4.500)

## 2015-04-07 LAB — MICROALBUMIN / CREATININE URINE RATIO
Creatinine, Urine: 129 mg/dL (ref 20–370)
MICROALB UR: 16.6 mg/dL
MICROALB/CREAT RATIO: 129 ug/mg{creat} — AB (ref <30)

## 2015-04-07 LAB — HEMOGLOBIN A1C
HEMOGLOBIN A1C: 5.7 % — AB (ref <5.7)
MEAN PLASMA GLUCOSE: 117 mg/dL — AB (ref <117)

## 2015-04-10 ENCOUNTER — Other Ambulatory Visit: Payer: Self-pay | Admitting: Internal Medicine

## 2015-04-11 ENCOUNTER — Ambulatory Visit (INDEPENDENT_AMBULATORY_CARE_PROVIDER_SITE_OTHER): Payer: Medicare Other | Admitting: Osteopathic Medicine

## 2015-04-11 ENCOUNTER — Encounter: Payer: Self-pay | Admitting: Osteopathic Medicine

## 2015-04-11 VITALS — BP 146/72 | HR 52 | Wt 138.0 lb

## 2015-04-11 DIAGNOSIS — E875 Hyperkalemia: Secondary | ICD-10-CM

## 2015-04-11 DIAGNOSIS — J449 Chronic obstructive pulmonary disease, unspecified: Secondary | ICD-10-CM

## 2015-04-11 DIAGNOSIS — R001 Bradycardia, unspecified: Secondary | ICD-10-CM

## 2015-04-11 NOTE — Progress Notes (Signed)
HPI: Tyler LUCIBELLO Sr. is a 75 y.o. male who presents to [facility] today for chief complaint of:  Chief Complaint  Patient presents with  . Follow-up    labs    . Hyperkalemia 5.7 from an routine labs a few days ago. Pt here to get labs redone and discuss management. Most likely CKD however needs to be followed and be sure not critical - pt voices understanding.   Bradycardia - chronic, pt denies dizziness.LOC, palpitations, he is seeing caridologist.   COPD - pt has been on multiple inhalers and says only albuterol helps, also others are expensive, I advised we can try coupons or other inhalers but pt not interested   Past medical, social and family history reviewed: Past Medical History  Diagnosis Date  . COPD (chronic obstructive pulmonary disease) ([place])   . Allergy   . Diabetes mellitus   . Emphysema of lung ([place])   . GERD (gastroesophageal reflux disease)   . Hypertension   . Hyperlipidemia   . CAD (coronary artery disease)     a. s/p multiple PCIs to RCA and eventual CABG in 1999;  b.LHC 11/2003: CFX 95%, RCA occluded, SVG-RCA occluded, SVG-OM patent, native LAD patent. EF was 55%.  Patient had left to right collaterals and medical therapy was recommended    . Anxiety   . PVD (peripheral vascular disease) ([place])   . OA (osteoarthritis)   . Asthma   . Iron deficiency anemia   . Memory loss   . Insomnia   . DDD (degenerative disc disease), lumbar   . OSA (obstructive sleep apnea)   . Depression   . Benign prostatic hypertrophy   . Lumbar back pain     chronic  . Spinal stenosis   . Herniated disc   . History of colonic polyps   . Diverticulitis, colon   . Esophageal stricture   . Hx of colonoscopy   . Hiatal hernia   . HOH (hard of hearing)    Past Surgical History  Procedure Laterality Date  . Heart bypass    . Bilateral knee replacements    . Hemorrhoid surgery    . Carpal tunnel release    . Shoulder arthroscopy   right  . Coronary artery bypass graft  09/1997    double bypss  . Coronary angioplasty with stent placement  2/99  . Strabismus surgery  08/29/2011    Procedure: REPAIR STRABISMUS;  Surgeon: Derry Skill, MD;  Location: [facility];  Service: Ophthalmology;  Laterality: Left;   Social History  Substance Use Topics  . Smoking status: Current Every Day Smoker -- 1.00 packs/day for 40 years    Types: Cigarettes  . Smokeless tobacco: Never Used  . Alcohol Use: No   Family History  Problem Relation Age of Onset  . Stroke Father   . Emphysema Father   . Heart disease    . Hypertension    . Hyperlipidemia    . Kidney cancer Daughter   . Allergies    . Colon cancer Neg Hx   . Diabetes      Current Outpatient Prescriptions  Medication Sig Dispense Refill  . albuterol (VENTOLIN HFA) 108 (90 BASE) MCG/ACT inhaler Inhale 2 puffs into the lungs every 6 (six) hours as needed for wheezing or shortness of breath. 1 Inhaler 11  . ALPRAZolam (XANAX) 0.25 MG tablet TAKE 1 TABLET BY MOUTH THREE TIMES A DAY IF NEEDED 90 tablet 2  .  AMBULATORY NON FORMULARY MEDICATION 2.5 Liters home oxygen continuous    . Aspirin-Salicylamide-Caffeine (BC HEADACHE POWDER PO) Take 1 Package by mouth 4 (four) times daily as needed (headache or pain).    Marland Kitchen donepezil (ARICEPT) 10 MG tablet take 1 tablet by mouth at bedtime 30 tablet 5  . DULoxetine (CYMBALTA) 30 MG capsule   0  . DULoxetine (CYMBALTA) 60 MG capsule take 1 capsule by mouth once daily 30 capsule 5  . fluticasone (FLONASE) 50 MCG/ACT nasal spray instill 2 sprays into each nostril once daily 16 g 11  . glipiZIDE (GLUCOTROL XL) 2.5 MG 24 hr tablet Take 2.5 mg by mouth daily. Take only if blood sugar is greater than 180    . lamoTRIgine (LAMICTAL) 25 MG tablet take 1 tablet at bedtime for 14 days then 1 tablet twice a day  0  . metFORMIN (GLUCOPHAGE) 500 MG tablet TAKE 2 TABLETS BY MOUTH TWICE A DAY 360 tablet 1  . Multiple Vitamin (MULTIVITAMIN) capsule Take 1  capsule by mouth daily.     . nitroGLYCERIN (NITROSTAT) 0.4 MG SL tablet Place 1 tablet (0.4 mg total) under the tongue every 5 (five) minutes as needed. For chest pain 25 tablet 6  . omeprazole (PRILOSEC) 20 MG capsule take 1 capsule by mouth twice a day before meals 60 capsule 11  . pravastatin (PRAVACHOL) 80 MG tablet take 1 tablet by mouth once daily 90 tablet 3  . zolpidem (AMBIEN) 10 MG tablet Take 10 mg by mouth at bedtime as needed. For sleep     No current facility-administered medications for this visit.   Allergies  Allergen Reactions  . Celecoxib Nausea And Vomiting    REACTION: GI upset  . Chicken Protein Other (See Comments)    Will not eat chicken  . Fish-Derived Products Other (See Comments)    Will not eat seafood of any kind  . Morphine Itching    REACTION: itching  . Nalbuphine Other (See Comments)    REACTION: contraindication with oxycontin.  . Prednisone Other (See Comments)    REACTION: "yeast infections"  . Venlafaxine Other (See Comments)    REACTION: GI upset      Review of Systems: CONSTITUTIONAL:  No  fever, no chills, No  unintentional weight changes HEAD/EYES/EARS/NOSE/THROAT: No  headache, no vision change, no hearing change, No  sore throat, No  sinus pressure CARDIAC: No  chest pain, No  pressure, No palpitations, No  Orthopnea, no dizziness, no LOC, no LE edema RESPIRATORY: Yes  cough, chronic shortness of breath/wheeze     Exam:  BP 146/72 mmHg  Pulse 52  Wt 138 lb (62.596 kg) Constitutional: VS see above. General Appearance: alert, well-developed, well-nourished, NAD Respiratory: Normal respiratory effort. (+) diffuse bilateral wheeze, no rhonchi, no rales Cardiovascular: S1/S2 normal, no murmur, no rub/gallop auscultated. RRR rate approx 60 on auscultation   EKG reviewed: from cardiologist 10/28/13 and 11/09/14  Sinus brady down to 49 in office, incomplete RBBB   ASSESSMENT/PLAN:   Hyperkalemia - Plan: BASIC METABOLIC PANEL WITH GFR  repeat labs, signed out to on call person possibility of critical value, pt aware to expect call if critical level otherwise will call with results tomorrw.   Chronic obstructive pulmonary disease, unspecified COPD type ([place]), pt declines inhalers, likely needs O2, did not fail 6 min walk last visit, will get PFT, pt states he'll make appt for this.   Bradycardia - chronic, pt following with cardiology   Return (next visit, please schedule  for nurse visit PFT prior to visit with Dr A for COPD).

## 2015-04-12 ENCOUNTER — Telehealth: Payer: Self-pay | Admitting: Osteopathic Medicine

## 2015-04-12 DIAGNOSIS — M5441 Lumbago with sciatica, right side: Secondary | ICD-10-CM | POA: Diagnosis not present

## 2015-04-12 DIAGNOSIS — M5416 Radiculopathy, lumbar region: Secondary | ICD-10-CM | POA: Diagnosis not present

## 2015-04-12 DIAGNOSIS — G890 Central pain syndrome: Secondary | ICD-10-CM | POA: Diagnosis not present

## 2015-04-12 DIAGNOSIS — M5442 Lumbago with sciatica, left side: Secondary | ICD-10-CM | POA: Diagnosis not present

## 2015-04-12 DIAGNOSIS — G894 Chronic pain syndrome: Secondary | ICD-10-CM | POA: Diagnosis not present

## 2015-04-12 NOTE — Telephone Encounter (Signed)
Hadn't seen results come back for follow-up potassium, I called the patient, spoke to his wife, she states that when they went down to have this blood drawn after their visit with me the other day, blood was unable to be obtained in one day due to a vein rolling and the second tube that was gotten clotted. They had initially planned to come back today to get this blood work done however he had an appointment with his pain management physician and was unable to do this. Wife states that they plan to come tomorrow to have this lab check, I reiterated the importance of following up on blood work as was discussed in his office visit, due to potentially serious nature of abnormal potassium.

## 2015-04-13 DIAGNOSIS — E875 Hyperkalemia: Secondary | ICD-10-CM | POA: Diagnosis not present

## 2015-04-14 LAB — BASIC METABOLIC PANEL WITH GFR
BUN: 28 mg/dL — AB (ref 7–25)
CHLORIDE: 105 mmol/L (ref 98–110)
CO2: 25 mmol/L (ref 20–31)
CREATININE: 1.24 mg/dL — AB (ref 0.70–1.18)
Calcium: 9.1 mg/dL (ref 8.6–10.3)
GFR, Est African American: 67 mL/min (ref >=60)
GFR, Est Non African American: 58 mL/min — ABNORMAL LOW (ref >=60)
Glucose, Bld: 93 mg/dL (ref 65–99)
Potassium: 5.1 mmol/L (ref 3.5–5.3)
Sodium: 139 mmol/L (ref 135–146)

## 2015-04-17 ENCOUNTER — Ambulatory Visit: Payer: Medicare Other | Admitting: Osteopathic Medicine

## 2015-04-19 DIAGNOSIS — F332 Major depressive disorder, recurrent severe without psychotic features: Secondary | ICD-10-CM | POA: Diagnosis not present

## 2015-04-26 DIAGNOSIS — F332 Major depressive disorder, recurrent severe without psychotic features: Secondary | ICD-10-CM | POA: Diagnosis not present

## 2015-04-26 DIAGNOSIS — F0281 Dementia in other diseases classified elsewhere with behavioral disturbance: Secondary | ICD-10-CM | POA: Diagnosis not present

## 2015-04-26 DIAGNOSIS — F411 Generalized anxiety disorder: Secondary | ICD-10-CM | POA: Diagnosis not present

## 2015-04-28 ENCOUNTER — Other Ambulatory Visit: Payer: Medicare Other

## 2015-05-08 ENCOUNTER — Encounter (INDEPENDENT_AMBULATORY_CARE_PROVIDER_SITE_OTHER): Payer: Medicare Other | Admitting: Osteopathic Medicine

## 2015-05-08 ENCOUNTER — Ambulatory Visit: Payer: Medicare Other | Admitting: Osteopathic Medicine

## 2015-05-08 DIAGNOSIS — Z720 Tobacco use: Secondary | ICD-10-CM

## 2015-05-08 DIAGNOSIS — F17200 Nicotine dependence, unspecified, uncomplicated: Secondary | ICD-10-CM

## 2015-05-09 ENCOUNTER — Ambulatory Visit (INDEPENDENT_AMBULATORY_CARE_PROVIDER_SITE_OTHER): Payer: Medicare Other | Admitting: Osteopathic Medicine

## 2015-05-09 ENCOUNTER — Encounter: Payer: Self-pay | Admitting: Osteopathic Medicine

## 2015-05-09 VITALS — BP 107/68 | HR 60 | Wt 140.0 lb

## 2015-05-09 VITALS — BP 107/68 | HR 60 | Wt 138.0 lb

## 2015-05-09 DIAGNOSIS — Z720 Tobacco use: Secondary | ICD-10-CM

## 2015-05-09 DIAGNOSIS — J449 Chronic obstructive pulmonary disease, unspecified: Secondary | ICD-10-CM | POA: Diagnosis not present

## 2015-05-09 DIAGNOSIS — J439 Emphysema, unspecified: Secondary | ICD-10-CM

## 2015-05-09 DIAGNOSIS — F17200 Nicotine dependence, unspecified, uncomplicated: Secondary | ICD-10-CM

## 2015-05-09 MED ORDER — ALBUTEROL SULFATE (2.5 MG/3ML) 0.083% IN NEBU
2.5000 mg | INHALATION_SOLUTION | Freq: Once | RESPIRATORY_TRACT | Status: AC
  Administered 2015-05-09: 2.5 mg via RESPIRATORY_TRACT

## 2015-05-09 MED ORDER — FLUTICASONE FUROATE-VILANTEROL 100-25 MCG/INH IN AEPB
1.0000 | INHALATION_SPRAY | Freq: Every day | RESPIRATORY_TRACT | Status: DC
Start: 2015-05-09 — End: 2015-10-31

## 2015-05-09 NOTE — Progress Notes (Signed)
HPI: Tyler LELLO Sr. is a 75 y.o. male who presents to [facility] today for chief complaint of:  Chief Complaint  Patient presents with  . Follow-up    COPD    COPD: Patient here to follow-up on spirometry testing results. These were reviewed in detail with the patient and his wife. See scanned documents. Results consistent with COPD goal stage III, mild restrictive component, some reversibility. Patient has not been interested in inhalers up until now. He does note some difficulty performing activities of daily living which involve much walking, at previous visit he did have 6 and a walk in the office maintained oxygen saturation but did grow tired easily. Open to starting inhaled medication therapy at this time.    Past medical, social and family history reviewed: Past Medical History  Diagnosis Date  . COPD (chronic obstructive pulmonary disease) ([place])   . Allergy   . Diabetes mellitus   . Emphysema of lung ([place])   . GERD (gastroesophageal reflux disease)   . Hypertension   . Hyperlipidemia   . CAD (coronary artery disease)     a. s/p multiple PCIs to RCA and eventual CABG in 1999;  b.LHC 11/2003: CFX 95%, RCA occluded, SVG-RCA occluded, SVG-OM patent, native LAD patent. EF was 55%.  Patient had left to right collaterals and medical therapy was recommended    . Anxiety   . PVD (peripheral vascular disease) ([place])   . OA (osteoarthritis)   . Asthma   . Iron deficiency anemia   . Memory loss   . Insomnia   . DDD (degenerative disc disease), lumbar   . OSA (obstructive sleep apnea)   . Depression   . Benign prostatic hypertrophy   . Lumbar back pain     chronic  . Spinal stenosis   . Herniated disc   . History of colonic polyps   . Diverticulitis, colon   . Esophageal stricture   . Hx of colonoscopy   . Hiatal hernia   . HOH (hard of hearing)    Past Surgical History  Procedure Laterality Date  . Heart bypass    . Bilateral knee  replacements    . Hemorrhoid surgery    . Carpal tunnel release    . Shoulder arthroscopy  right  . Coronary artery bypass graft  09/1997    double bypss  . Coronary angioplasty with stent placement  2/99  . Strabismus surgery  08/29/2011    Procedure: REPAIR STRABISMUS;  Surgeon: Derry Skill, MD;  Location: [facility];  Service: Ophthalmology;  Laterality: Left;   Social History  Substance Use Topics  . Smoking status: Current Every Day Smoker -- 1.00 packs/day for 40 years    Types: Cigarettes  . Smokeless tobacco: Never Used  . Alcohol Use: No   Family History  Problem Relation Age of Onset  . Stroke Father   . Emphysema Father   . Heart disease    . Hypertension    . Hyperlipidemia    . Kidney cancer Daughter   . Allergies    . Colon cancer Neg Hx   . Diabetes      Current Outpatient Prescriptions  Medication Sig Dispense Refill  . albuterol (VENTOLIN HFA) 108 (90 BASE) MCG/ACT inhaler Inhale 2 puffs into the lungs every 6 (six) hours as needed for wheezing or shortness of breath. 1 Inhaler 11  . ALPRAZolam (XANAX) 0.25 MG tablet TAKE 1 TABLET BY MOUTH THREE TIMES A  DAY IF NEEDED 90 tablet 2  . AMBULATORY NON FORMULARY MEDICATION 2.5 Liters home oxygen continuous    . Aspirin-Salicylamide-Caffeine (BC HEADACHE POWDER PO) Take 1 Package by mouth 4 (four) times daily as needed (headache or pain).    Marland Kitchen donepezil (ARICEPT) 10 MG tablet take 1 tablet by mouth at bedtime 30 tablet 5  . DULoxetine (CYMBALTA) 30 MG capsule   0  . DULoxetine (CYMBALTA) 60 MG capsule take 1 capsule by mouth once daily 30 capsule 5  . fluticasone (FLONASE) 50 MCG/ACT nasal spray instill 2 sprays into each nostril once daily 16 g 11  . glipiZIDE (GLUCOTROL XL) 2.5 MG 24 hr tablet Take 2.5 mg by mouth daily. Take only if blood sugar is greater than 180    . lamoTRIgine (LAMICTAL) 25 MG tablet take 1 tablet at bedtime for 14 days then 1 tablet twice a day  0  . metFORMIN (GLUCOPHAGE) 500 MG tablet TAKE 2  TABLETS BY MOUTH TWICE A DAY 360 tablet 1  . Multiple Vitamin (MULTIVITAMIN) capsule Take 1 capsule by mouth daily.     . nitroGLYCERIN (NITROSTAT) 0.4 MG SL tablet Place 1 tablet (0.4 mg total) under the tongue every 5 (five) minutes as needed. For chest pain 25 tablet 6  . omeprazole (PRILOSEC) 20 MG capsule take 1 capsule by mouth twice a day before meals 60 capsule 11  . pravastatin (PRAVACHOL) 80 MG tablet take 1 tablet by mouth once daily 90 tablet 3  . zolpidem (AMBIEN) 10 MG tablet Take 10 mg by mouth at bedtime as needed. For sleep     No current facility-administered medications for this visit.   Allergies  Allergen Reactions  . Celecoxib Nausea And Vomiting    REACTION: GI upset  . Chicken Protein Other (See Comments)    Will not eat chicken  . Fish-Derived Products Other (See Comments)    Will not eat seafood of any kind  . Morphine Itching    REACTION: itching  . Nalbuphine Other (See Comments)    REACTION: contraindication with oxycontin.  . Prednisone Other (See Comments)    REACTION: "yeast infections"  . Venlafaxine Other (See Comments)    REACTION: GI upset      Review of Systems: CONSTITUTIONAL:  No  fever, no chills, No  unintentional weight changes CARDIAC: No  chest pain, No  pressure, No palpitations, No  orthopnea RESPIRATORY: (+) Chronic cough, (+) chronic shortness of breath/wheeze  Exam:  BP 107/68 mmHg  Pulse 60  Wt 138 lb (62.596 kg) Constitutional: VS see above. General Appearance: alert, well-developed, well-nourished, NAD Eyes: Normal lids and conjunctive, non-icteric sclera, Respiratory: Somewhat increased respiratory effort. (+) Diffuse wheeze, (+) diffuse rhonchi, no rales, Menest breath sounds bilaterally Cardiovascular: S1/S2 normal, no murmur, no rub/gallop auscultated. RRR.     PFT INTERPRETATION  FEV1/FVC >70 *AND*  FEV1 >80% predicted  = NORMAL SPIROMETRY NORMAL? no  1. Valid study? yes 2. Flow-Volume Loop: borderline 3.  FEV1/FVC: 48%  >70 = Normal  <70 = Obstructive    = COPD yes 3. Severity of Obstruction ~ Post-Bronchodilator FEV1: 1.63 = 55% predicted  FEV1 80+ = GOLD Stage I = Mild  FEV1 50-79 = GOLD Stage II = Moderate  FEV1 30-49 = GOLD Stage III = Severe  FEV1 <30 = GOLD Stage IV = Very Severe 5. Bronchodilator Challenge  Increase FEV1 or FVC by 200+mL? yes *AND*  Increased same FEV1 or FVC by 12+%? yes  Reversible? yes 6. FVC <  80% = Restrictive mild, 74% 7. Lung Volumes  TLC, VC, RV Normal = 80-120%  TLC <80% = Restrictive   TLC >120% = Hyperinflation  RV >120% = Air Trapping  RV <80% = RLD or OLD  VC <80% = RLD or OLD 8. Diffusion Capacity - refer to Pulm needed? Not at this time    No results found for this or any previous visit (from the past 72 hour(s)).    ASSESSMENT/PLAN: Patient counseled on use of inhaler, given written prescription and savings coupon, advised to call office if medication is prohibitively expensive and we will try alternative.  Pulmonary emphysema, unspecified emphysema type (H[facility])  COPD, severe ([place]) - Plan: Fluticasone Furoate-Vilanterol (BREO ELLIPTA) 100-25 MCG/INH AEPB   Patient has been educated on importance of tobacco cessation to decrease risks of cardiovascular and pulmonary disease and to improve overall health & wellbeing. He is not interested in quitting smoking    Return in about 6 weeks (around 06/20/2015) for evaluate breathing on inhaler.   Total time spent 25 minutes, greater than 50% of the visit was counseling and coordinating care for diagnosis of copd.

## 2015-05-09 NOTE — Progress Notes (Signed)
See office visit note for full details

## 2015-05-10 DIAGNOSIS — J449 Chronic obstructive pulmonary disease, unspecified: Secondary | ICD-10-CM | POA: Insufficient documentation

## 2015-05-11 ENCOUNTER — Other Ambulatory Visit: Payer: Self-pay | Admitting: Internal Medicine

## 2015-05-12 NOTE — Telephone Encounter (Signed)
Please advise 

## 2015-05-12 NOTE — Telephone Encounter (Signed)
Medication printed signed and faxed to pharmacy  

## 2015-05-12 NOTE — Telephone Encounter (Signed)
Done hardcopy to Corinne  

## 2015-05-26 NOTE — Progress Notes (Signed)
This encounter was created in error - please disregard.

## 2015-05-30 DIAGNOSIS — F17200 Nicotine dependence, unspecified, uncomplicated: Secondary | ICD-10-CM | POA: Diagnosis not present

## 2015-05-30 DIAGNOSIS — Z885 Allergy status to narcotic agent status: Secondary | ICD-10-CM | POA: Diagnosis not present

## 2015-05-30 DIAGNOSIS — E7800 Pure hypercholesterolemia, unspecified: Secondary | ICD-10-CM | POA: Diagnosis not present

## 2015-05-30 DIAGNOSIS — E119 Type 2 diabetes mellitus without complications: Secondary | ICD-10-CM | POA: Diagnosis not present

## 2015-05-30 DIAGNOSIS — Z7984 Long term (current) use of oral hypoglycemic drugs: Secondary | ICD-10-CM | POA: Diagnosis not present

## 2015-05-30 DIAGNOSIS — Z79899 Other long term (current) drug therapy: Secondary | ICD-10-CM | POA: Diagnosis not present

## 2015-05-30 DIAGNOSIS — H5000 Unspecified esotropia: Secondary | ICD-10-CM | POA: Diagnosis not present

## 2015-05-30 DIAGNOSIS — Z888 Allergy status to other drugs, medicaments and biological substances status: Secondary | ICD-10-CM | POA: Diagnosis not present

## 2015-05-30 DIAGNOSIS — Z955 Presence of coronary angioplasty implant and graft: Secondary | ICD-10-CM | POA: Diagnosis not present

## 2015-05-30 DIAGNOSIS — Z7982 Long term (current) use of aspirin: Secondary | ICD-10-CM | POA: Diagnosis not present

## 2015-05-30 DIAGNOSIS — I10 Essential (primary) hypertension: Secondary | ICD-10-CM | POA: Diagnosis not present

## 2015-05-30 DIAGNOSIS — J45909 Unspecified asthma, uncomplicated: Secondary | ICD-10-CM | POA: Diagnosis not present

## 2015-05-30 DIAGNOSIS — J449 Chronic obstructive pulmonary disease, unspecified: Secondary | ICD-10-CM | POA: Diagnosis not present

## 2015-05-30 DIAGNOSIS — H532 Diplopia: Secondary | ICD-10-CM | POA: Diagnosis not present

## 2015-06-12 ENCOUNTER — Ambulatory Visit (INDEPENDENT_AMBULATORY_CARE_PROVIDER_SITE_OTHER): Payer: Medicare Other | Admitting: Osteopathic Medicine

## 2015-06-12 ENCOUNTER — Ambulatory Visit (INDEPENDENT_AMBULATORY_CARE_PROVIDER_SITE_OTHER): Payer: Medicare Other | Admitting: Sports Medicine

## 2015-06-12 VITALS — BP 138/58 | HR 89

## 2015-06-12 VITALS — BP 138/58 | HR 89 | Ht 66.0 in | Wt 144.0 lb

## 2015-06-12 DIAGNOSIS — M12811 Other specific arthropathies, not elsewhere classified, right shoulder: Secondary | ICD-10-CM

## 2015-06-12 DIAGNOSIS — M75101 Unspecified rotator cuff tear or rupture of right shoulder, not specified as traumatic: Principal | ICD-10-CM

## 2015-06-12 DIAGNOSIS — M75102 Unspecified rotator cuff tear or rupture of left shoulder, not specified as traumatic: Principal | ICD-10-CM

## 2015-06-12 DIAGNOSIS — J449 Chronic obstructive pulmonary disease, unspecified: Secondary | ICD-10-CM | POA: Diagnosis not present

## 2015-06-12 DIAGNOSIS — J309 Allergic rhinitis, unspecified: Secondary | ICD-10-CM

## 2015-06-12 DIAGNOSIS — M25512 Pain in left shoulder: Secondary | ICD-10-CM | POA: Diagnosis not present

## 2015-06-12 DIAGNOSIS — M12812 Other specific arthropathies, not elsewhere classified, left shoulder: Secondary | ICD-10-CM | POA: Diagnosis not present

## 2015-06-12 DIAGNOSIS — M19212 Secondary osteoarthritis, left shoulder: Secondary | ICD-10-CM

## 2015-06-12 DIAGNOSIS — M19211 Secondary osteoarthritis, right shoulder: Secondary | ICD-10-CM | POA: Insufficient documentation

## 2015-06-12 NOTE — Progress Notes (Signed)
HPI: Tyler GRINDER Sr. is a 75 y.o. male who presents to [facility]  today for chief complaint of:  Chief Complaint  Patient presents with  . Nasal Congestion  . Shoulder Pain    LEFT   RUNNY NOSE & HEADACHE "I HAVE ALLERGIES" . Location: sinuses . Quality: runny nose, sinus headaches  . Assoc signs/symptoms: see ROS, (+) bloody nose, no fever, (+) chronic cough myabe a bit worse . Modifying factors: has tried the following OTC/Rx medications: Flonase    SHOULDER PAIN . Location: L shoulder painful, R was bad but now is ok  . Quality: like a toothache . Duration: 3 weeks  . Timing: worse with movement.  . Context: "no cuff on either shoulder"  . Modifying factors: any movements hurt it. Ace bandage was a bit helpful but not really. Never had surgery on it.  . Assoc signs/symptoms: some tingling in the fingers since shoulder started hurting  Pt requests injection if possible   Past medical, social and family history reviewed. Current medications and allergies reviewed.     Review of Systems: CONSTITUTIONAL: no fever/chills HEAD/EYES/EARS/NOSE/THROAT: yes headache, no vision change or hearing change, no sore throat CARDIAC: No chest pain/pressure/palpitations RESPIRATORY: yes cough, yes shortness of breath - chronic GASTROINTESTINAL: no nausea, no vomiting, no abdominal pain, no diarrhea MUSCULOSKELETAL: yes myalgia/arthralgia, see HPI re shoulder pain   Exam:  BP 138/58 mmHg  Pulse 89  Ht 5\' 6"  (1.676 m)  Wt 144 lb (65.318 kg)  BMI 23.25 kg/m2 Constitutional: VSS, see above. General Appearance: alert, well-developed, well-nourished, NAD Eyes: Normal lids and conjunctive, non-icteric sclera,  Ears, Nose, Mouth, Throat: Normal external inspection ears/nares/mouth/lips/gums, MMM;       posterior pharynx without erythema, without exudate Neck: No masses, trachea midline. normal lymph nodes Respiratory: Normal respiratory effort.  Breath sounds diminished all fields, diffuse wheeze/rhonchi/rales - stable from previous exams Cardiovascular: sounds faint but S1/S2 normal, no murmur/rub/gallop auscultated. RRR. Musculoskeletal: Shoulder: left - passive and active ROM limited in all directions due to pain, incomplete exam. Joint - no effusion, nontender to palpation. Apprehension test: unable to tolerate positioning for exam    No results found for this or any previous visit (from the past 72 hour(s)). No results found.   ASSESSMENT/PLAN: See patient instructions - allergies vs viral URI, mild symptoms, avoid abx at this time but ER/RTC precautions reviewed.   Left shoulder pain - Dr T. consulted for US-guided injection  Allergic rhinitis, unspecified allergic rhinitis type - continue Flonase, ok to add second generation antihistamine  COPD, severe ([place])    Return in about 6 weeks (around 07/24/2015), or or sooner if coughing i sno better or gets worse.Marland Kitchen

## 2015-06-12 NOTE — Assessment & Plan Note (Signed)
Injection placed into the glenohumeral joint resulted in complete pain relief. Patient declines any form of physical therapy, return to see me as needed.

## 2015-06-12 NOTE — Patient Instructions (Addendum)
For sinuses - Continue Flonase, try adding a daily over-the-counter antihistamine such as Claritin or Allegra - note Allegra (generic: fexofenadine) is the least sedating of these medicines. DO NOT take Benadryl as this has been shows to be dangerously sedating in older people, especially with the other medicines you are taking. Please call the office if this isn't working for you after a week or so, I can call in something else if needed, you don't need to come in for another appointment.   If your cough gets worse, especially if you develop fever or worse shortness of breath, let me know. I would continue with the Breo inhaler and we can also add another inhaler if needed, but I'd like to see if we can get the allergies better controlled first because this might be why you are coughing more.   If shoulder is still bothering you, I suggest making an appointment to follow-up with Dr Dianah Field (Dr. Darene Lamer.) and he may be able to offer other types of treatments or injections.   Any questions or problems, let me know! Take care!

## 2015-06-12 NOTE — Progress Notes (Signed)
   Subjective:    I'm seeing this patient as a consultation for:  Dr. [name]  CC: left shoulder pain  HPI: This is a pleasant 75 year old male, he has a long history of chronic shoulder pain, with known torn and retracted biceps tendon, as well as retracted and torn rotator cuff tendons. He has glenohumeral and acromioclavicular osteoarthritis. Pain is moderate, persistent and localized over the deltoid, worse with nearly any motion of her shoulder. He desires interventional treatment today and declines any formal physical therapy.  Past medical history, Surgical history, Family history not pertinant except as noted below, Social history, Allergies, and medications have been entered into the medical record, reviewed, and no changes needed.   Review of Systems: No headache, visual changes, nausea, vomiting, diarrhea, constipation, dizziness, abdominal pain, skin rash, fevers, chills, night sweats, weight loss, swollen lymph nodes, body aches, joint swelling, muscle aches, chest pain, shortness of breath, mood changes, visual or auditory hallucinations.   Objective:   General: Well Developed, well nourished, and in no acute distress.  Neuro/Psych: Alert and oriented x3, extra-ocular muscles intact, able to move all 4 extremities, sensation grossly intact. Skin: Warm and dry, no rashes noted.  Respiratory: Not using accessory muscles, speaking in full sentences, trachea midline.  Cardiovascular: Pulses palpable, no extremity edema. Abdomen: Does not appear distended. Left shoulder: Positive Hawkins sign, Neer sign, empty can sign, positive crank test as well.  Procedure: Real-time Ultrasound Guided Injection of left glenohumeral joint Device: GE Logiq E  Verbal informed consent obtained.  Time-out conducted.  Noted no overlying erythema, induration, or other signs of local infection.  Skin prepped in a sterile fashion.  Local anesthesia: Topical Ethyl chloride.  With sterile  technique and under real time ultrasound guidance:  22-gauge needle advanced into the glenohumeral joint and 1 mL kenalog 40, 3 mL lidocaine injected easily. Completed without difficulty  Pain immediately resolved suggesting accurate placement of the medication.  Advised to call if fevers/chills, erythema, induration, drainage, or persistent bleeding.  Images permanently stored and available for review in the ultrasound unit.  Impression: Technically successful ultrasound guided injection.  Impression and Recommendations:   This case required medical decision making of moderate complexity.

## 2015-06-14 DIAGNOSIS — F0281 Dementia in other diseases classified elsewhere with behavioral disturbance: Secondary | ICD-10-CM | POA: Diagnosis not present

## 2015-06-14 DIAGNOSIS — F332 Major depressive disorder, recurrent severe without psychotic features: Secondary | ICD-10-CM | POA: Diagnosis not present

## 2015-06-14 DIAGNOSIS — F411 Generalized anxiety disorder: Secondary | ICD-10-CM | POA: Diagnosis not present

## 2015-06-19 DIAGNOSIS — H532 Diplopia: Secondary | ICD-10-CM | POA: Diagnosis not present

## 2015-06-19 DIAGNOSIS — S058X1A Other injuries of right eye and orbit, initial encounter: Secondary | ICD-10-CM | POA: Diagnosis not present

## 2015-06-20 ENCOUNTER — Ambulatory Visit (INDEPENDENT_AMBULATORY_CARE_PROVIDER_SITE_OTHER): Payer: Medicare Other

## 2015-06-20 ENCOUNTER — Ambulatory Visit (INDEPENDENT_AMBULATORY_CARE_PROVIDER_SITE_OTHER): Payer: Medicare Other | Admitting: Sports Medicine

## 2015-06-20 ENCOUNTER — Encounter: Payer: Self-pay | Admitting: Sports Medicine

## 2015-06-20 DIAGNOSIS — M12811 Other specific arthropathies, not elsewhere classified, right shoulder: Secondary | ICD-10-CM

## 2015-06-20 DIAGNOSIS — M75102 Unspecified rotator cuff tear or rupture of left shoulder, not specified as traumatic: Principal | ICD-10-CM

## 2015-06-20 DIAGNOSIS — M12812 Other specific arthropathies, not elsewhere classified, left shoulder: Secondary | ICD-10-CM

## 2015-06-20 DIAGNOSIS — M75101 Unspecified rotator cuff tear or rupture of right shoulder, not specified as traumatic: Principal | ICD-10-CM

## 2015-06-20 DIAGNOSIS — M25512 Pain in left shoulder: Secondary | ICD-10-CM | POA: Diagnosis not present

## 2015-06-20 MED ORDER — TRAMADOL HCL 50 MG PO TABS: ORAL_TABLET | ORAL | Status: DC

## 2015-06-20 NOTE — Progress Notes (Signed)
  Subjective:    CC: follow-up  HPI: Left shoulder pain: This is a pleasant 75 year old male with known rotator cuff arthropathy of the left shoulder, he has multiple rotator cuff tears, tears of the long head of the biceps as well as acromioclavicular osteoarthritis. We did glenohumeral injection in a previous visit, and he had complete relief of her glenohumeral type pain, continues to have pain over the acromioclavicular joint as well as over the deltoid with abduction.  Past medical history, Surgical history, Family history not pertinant except as noted below, Social history, Allergies, and medications have been entered into the medical record, reviewed, and no changes needed.   Review of Systems: No fevers, chills, night sweats, weight loss, chest pain, or shortness of breath.   Objective:    General: Well Developed, well nourished, and in no acute distress.  Neuro: Alert and oriented x3, extra-ocular muscles intact, sensation grossly intact.  HEENT: Normocephalic, atraumatic, pupils equal round reactive to light, neck supple, no masses, no lymphadenopathy, thyroid nonpalpable.  Skin: Warm and dry, no rashes. Cardiac: Regular rate and rhythm, no murmurs rubs or gallops, no lower extremity edema.  Respiratory: Clear to auscultation bilaterally. Not using accessory muscles, speaking in full sentences. Left Shoulder: Inspection reveals no abnormalities, atrophy or asymmetry. Pain over the acromioclavicular joint with a positive cross arm sign ROM is full in all planes. Rotator cuff strength normal throughout. positive Neer and Hawkin's tests, empty can. Speeds and Yergason's tests normal. No labral pathology noted with negative Obrien's, negative crank, negative clunk, and good stability. Normal scapular function observed. No painful arc and no drop arm sign. No apprehension sign  Procedure: Real-time Ultrasound Guided Injection of left subacromial bursa Device: GE Logiq E  Verbal  informed consent obtained.  Time-out conducted.  Noted no overlying erythema, induration, or other signs of local infection.  Skin prepped in a sterile fashion.  Local anesthesia: Topical Ethyl chloride.  With sterile technique and under real time ultrasound guidance:  1 mL Kenalog 40, 2 mL lidocaine 20 mL Marcaine injected easily Completed without difficulty  Pain immediately resolved suggesting accurate placement of the medication.  Advised to call if fevers/chills, erythema, induration, drainage, or persistent bleeding.  Images permanently stored and available for review in the ultrasound unit.  Impression: Technically successful ultrasound guided injection.  Procedure: Real-time Ultrasound Guided Injection of left acromioclavicular joint Device: GE Logiq E  Verbal informed consent obtained.  Time-out conducted.  Noted no overlying erythema, induration, or other signs of local infection.  Skin prepped in a sterile fashion.  Local anesthesia: Topical Ethyl chloride.  With sterile technique and under real time ultrasound guidance:  25-gauge daily advanced into joint, 1/2 mL kenalog 40, 1/2 mL lidocaine injected easily. Completed without difficulty  Pain immediately resolved suggesting accurate placement of the medication.  Advised to call if fevers/chills, erythema, induration, drainage, or persistent bleeding.  Images permanently stored and available for review in the ultrasound unit.  Impression: Technically successful ultrasound guided injection.  Impression and Recommendations:

## 2015-06-20 NOTE — Assessment & Plan Note (Signed)
Glenohumeral injection provided good relief of joint related pain, still has some impingement type pain as well as acromioclavicular pain. Injections placed into the acromioclavicular joint and subacromial bursa today, tramadol for pain, he will touch base with his cardiologist for cardiac clearance. I'm going to set him up for referral with Dr. Tamera Punt. MRI was back in 2010, ordering a new one.

## 2015-06-21 ENCOUNTER — Telehealth: Payer: Self-pay | Admitting: Cardiovascular Disease

## 2015-06-21 NOTE — Telephone Encounter (Signed)
error 

## 2015-06-26 ENCOUNTER — Ambulatory Visit (INDEPENDENT_AMBULATORY_CARE_PROVIDER_SITE_OTHER): Payer: Medicare Other

## 2015-06-26 DIAGNOSIS — M75102 Unspecified rotator cuff tear or rupture of left shoulder, not specified as traumatic: Principal | ICD-10-CM

## 2015-06-26 DIAGNOSIS — M75101 Unspecified rotator cuff tear or rupture of right shoulder, not specified as traumatic: Principal | ICD-10-CM

## 2015-06-26 DIAGNOSIS — S46912A Strain of unspecified muscle, fascia and tendon at shoulder and upper arm level, left arm, initial encounter: Secondary | ICD-10-CM | POA: Diagnosis not present

## 2015-06-26 DIAGNOSIS — S4382XA Sprain of other specified parts of left shoulder girdle, initial encounter: Secondary | ICD-10-CM | POA: Diagnosis not present

## 2015-06-26 DIAGNOSIS — X58XXXA Exposure to other specified factors, initial encounter: Secondary | ICD-10-CM | POA: Diagnosis not present

## 2015-06-26 DIAGNOSIS — M12811 Other specific arthropathies, not elsewhere classified, right shoulder: Secondary | ICD-10-CM

## 2015-06-26 DIAGNOSIS — M12812 Other specific arthropathies, not elsewhere classified, left shoulder: Secondary | ICD-10-CM

## 2015-06-27 ENCOUNTER — Telehealth: Payer: Self-pay

## 2015-06-27 DIAGNOSIS — M75102 Unspecified rotator cuff tear or rupture of left shoulder, not specified as traumatic: Principal | ICD-10-CM

## 2015-06-27 DIAGNOSIS — M12811 Other specific arthropathies, not elsewhere classified, right shoulder: Secondary | ICD-10-CM

## 2015-06-27 DIAGNOSIS — M75101 Unspecified rotator cuff tear or rupture of right shoulder, not specified as traumatic: Principal | ICD-10-CM

## 2015-06-27 DIAGNOSIS — M12812 Other specific arthropathies, not elsewhere classified, left shoulder: Secondary | ICD-10-CM

## 2015-06-27 MED ORDER — TRAMADOL HCL 50 MG PO TABS: ORAL_TABLET | ORAL | Status: DC

## 2015-06-27 NOTE — Telephone Encounter (Signed)
Pt wife left VM and would like to know if he can have something else for pain relief to make it to his appointment with Dr. Tamera Punt on 07/05/15. He is out of the medication you recently gave him. Please advise.

## 2015-06-27 NOTE — Telephone Encounter (Signed)
Tramadol printed out.

## 2015-07-03 DIAGNOSIS — F411 Generalized anxiety disorder: Secondary | ICD-10-CM | POA: Diagnosis not present

## 2015-07-03 DIAGNOSIS — F332 Major depressive disorder, recurrent severe without psychotic features: Secondary | ICD-10-CM | POA: Diagnosis not present

## 2015-07-03 DIAGNOSIS — F0281 Dementia in other diseases classified elsewhere with behavioral disturbance: Secondary | ICD-10-CM | POA: Diagnosis not present

## 2015-07-05 DIAGNOSIS — M19212 Secondary osteoarthritis, left shoulder: Secondary | ICD-10-CM | POA: Diagnosis not present

## 2015-07-05 DIAGNOSIS — M25512 Pain in left shoulder: Secondary | ICD-10-CM | POA: Diagnosis not present

## 2015-07-05 DIAGNOSIS — M75122 Complete rotator cuff tear or rupture of left shoulder, not specified as traumatic: Secondary | ICD-10-CM | POA: Diagnosis not present

## 2015-07-11 ENCOUNTER — Other Ambulatory Visit: Payer: Self-pay

## 2015-07-11 DIAGNOSIS — G894 Chronic pain syndrome: Secondary | ICD-10-CM | POA: Diagnosis not present

## 2015-07-11 DIAGNOSIS — M12811 Other specific arthropathies, not elsewhere classified, right shoulder: Secondary | ICD-10-CM

## 2015-07-11 DIAGNOSIS — M75101 Unspecified rotator cuff tear or rupture of right shoulder, not specified as traumatic: Principal | ICD-10-CM

## 2015-07-11 DIAGNOSIS — M5442 Lumbago with sciatica, left side: Secondary | ICD-10-CM | POA: Diagnosis not present

## 2015-07-11 DIAGNOSIS — M75102 Unspecified rotator cuff tear or rupture of left shoulder, not specified as traumatic: Principal | ICD-10-CM

## 2015-07-11 DIAGNOSIS — G890 Central pain syndrome: Secondary | ICD-10-CM | POA: Diagnosis not present

## 2015-07-11 DIAGNOSIS — M5441 Lumbago with sciatica, right side: Secondary | ICD-10-CM | POA: Diagnosis not present

## 2015-07-11 DIAGNOSIS — M5416 Radiculopathy, lumbar region: Secondary | ICD-10-CM | POA: Diagnosis not present

## 2015-07-11 DIAGNOSIS — M12812 Other specific arthropathies, not elsewhere classified, left shoulder: Secondary | ICD-10-CM

## 2015-07-11 MED ORDER — TRAMADOL HCL 50 MG PO TABS: ORAL_TABLET | ORAL | Status: DC

## 2015-07-20 DIAGNOSIS — F332 Major depressive disorder, recurrent severe without psychotic features: Secondary | ICD-10-CM | POA: Diagnosis not present

## 2015-07-20 DIAGNOSIS — F411 Generalized anxiety disorder: Secondary | ICD-10-CM | POA: Diagnosis not present

## 2015-07-20 DIAGNOSIS — F0281 Dementia in other diseases classified elsewhere with behavioral disturbance: Secondary | ICD-10-CM | POA: Diagnosis not present

## 2015-07-26 ENCOUNTER — Telehealth: Payer: Self-pay

## 2015-07-26 MED ORDER — DONEPEZIL HCL 10 MG PO TABS: 10.0000 mg | ORAL_TABLET | Freq: Every day | ORAL | Status: DC

## 2015-07-26 NOTE — Telephone Encounter (Signed)
Patient request refill for Aricept 10 mg #30 5 refills sent to [facility]. Ryen Rhames,CMA

## 2015-08-04 ENCOUNTER — Ambulatory Visit (INDEPENDENT_AMBULATORY_CARE_PROVIDER_SITE_OTHER): Payer: Medicare Other | Admitting: Sports Medicine

## 2015-08-04 ENCOUNTER — Encounter: Payer: Self-pay | Admitting: Sports Medicine

## 2015-08-04 VITALS — BP 136/84 | HR 84 | Resp 18 | Wt 155.1 lb

## 2015-08-04 DIAGNOSIS — M19211 Secondary osteoarthritis, right shoulder: Secondary | ICD-10-CM | POA: Diagnosis not present

## 2015-08-04 DIAGNOSIS — M19212 Secondary osteoarthritis, left shoulder: Secondary | ICD-10-CM

## 2015-08-04 NOTE — Progress Notes (Signed)
  Subjective:    CC: Right shoulder pain  HPI: Bilateral rotator cuff tear osteoarthritis, previous injections into the left subacromial bursa and acromioclavicular joint provided minimal relief, he does have an appointment with his cardiologist for surgical clearance in anticipation of a left reverse shoulder arthroplasty. He is also having some pain in the right shoulder localized at the joint line, moderate, persistent without radiation and desires interventional treatment today.   Past medical history, Surgical history, Family history not pertinant except as noted below, Social history, Allergies, and medications have been entered into the medical record, reviewed, and no changes needed.   Review of Systems: No fevers, chills, night sweats, weight loss, chest pain, or shortness of breath.   Objective:    General: Well Developed, well nourished, and in no acute distress.  Neuro: Alert and oriented x3, extra-ocular muscles intact, sensation grossly intact.  HEENT: Normocephalic, atraumatic, pupils equal round reactive to light, neck supple, no masses, no lymphadenopathy, thyroid nonpalpable.  Skin: Warm and dry, no rashes. Cardiac: Regular rate and rhythm, no murmurs rubs or gallops, no lower extremity edema.  Respiratory: Clear to auscultation bilaterally. Not using accessory muscles, speaking in full sentences. Right shoulder: Positive crank test. Negative impingement signs.  Procedure: Real-time Ultrasound Guided Injection of right glenohumeral joint Device: GE Logiq E  Verbal informed consent obtained.  Time-out conducted.  Noted no overlying erythema, induration, or other signs of local infection.  Skin prepped in a sterile fashion.  Local anesthesia: Topical Ethyl chloride.  With sterile technique and under real time ultrasound guidance:  Spinal needle advanced into the joint and 1 mL kenalog 40, 2 mL lidocaine, 2 mL Marcaine injected easily. Completed without difficulty  Pain  immediately resolved suggesting accurate placement of the medication.  Advised to call if fevers/chills, erythema, induration, drainage, or persistent bleeding.  Images permanently stored and available for review in the ultrasound unit.  Impression: Technically successful ultrasound guided injection.  Impression and Recommendations:   I spent 25 minutes with this patient, greater than 50% was face-to-face time counseling regarding the above diagnoses

## 2015-08-04 NOTE — Assessment & Plan Note (Signed)
Bilateral rotator cuff tear osteoarthritis, previous injections into the left subacromial bursa and acromioclavicular joint provided minimal relief, he does have an appointment with his cardiologist for surgical clearance in anticipation of a left reverse shoulder arthroplasty. He is also having some pain in the right shoulder localized at the joint line and desires interventional treatment today.  Glenohumeral injection as above, return in one month.

## 2015-08-10 DIAGNOSIS — G894 Chronic pain syndrome: Secondary | ICD-10-CM | POA: Diagnosis not present

## 2015-08-10 DIAGNOSIS — M5442 Lumbago with sciatica, left side: Secondary | ICD-10-CM | POA: Diagnosis not present

## 2015-08-10 DIAGNOSIS — Z79891 Long term (current) use of opiate analgesic: Secondary | ICD-10-CM | POA: Diagnosis not present

## 2015-08-10 DIAGNOSIS — M5441 Lumbago with sciatica, right side: Secondary | ICD-10-CM | POA: Diagnosis not present

## 2015-08-10 DIAGNOSIS — G890 Central pain syndrome: Secondary | ICD-10-CM | POA: Diagnosis not present

## 2015-08-11 ENCOUNTER — Other Ambulatory Visit: Payer: Self-pay

## 2015-08-11 MED ORDER — ALPRAZOLAM 0.25 MG PO TABS: ORAL_TABLET | ORAL | Status: DC

## 2015-08-11 NOTE — Telephone Encounter (Addendum)
Rx faxed to [facility]. Rhonda Cunningham,CMA

## 2015-08-11 NOTE — Telephone Encounter (Signed)
Patient request a refill for Alprazolam 0.25. Rx was originally prescribed by Dr. Cathlean Cower. Patient stated that Dr. Sheppard Coil stated that if he needs refills she would refill it but I didn't see any documentation of that so please advice. Rhonda Cunningham,CMA

## 2015-08-23 ENCOUNTER — Encounter: Payer: Self-pay | Admitting: Osteopathic Medicine

## 2015-08-23 ENCOUNTER — Ambulatory Visit (INDEPENDENT_AMBULATORY_CARE_PROVIDER_SITE_OTHER): Payer: Medicare Other

## 2015-08-23 ENCOUNTER — Ambulatory Visit (INDEPENDENT_AMBULATORY_CARE_PROVIDER_SITE_OTHER): Payer: Medicare Other | Admitting: Osteopathic Medicine

## 2015-08-23 VITALS — BP 134/87 | HR 101 | Ht 66.0 in | Wt 143.0 lb

## 2015-08-23 DIAGNOSIS — N182 Chronic kidney disease, stage 2 (mild): Secondary | ICD-10-CM | POA: Diagnosis not present

## 2015-08-23 DIAGNOSIS — E440 Moderate protein-calorie malnutrition: Secondary | ICD-10-CM

## 2015-08-23 DIAGNOSIS — R6881 Early satiety: Secondary | ICD-10-CM

## 2015-08-23 DIAGNOSIS — N183 Chronic kidney disease, stage 3 unspecified: Secondary | ICD-10-CM

## 2015-08-23 DIAGNOSIS — J449 Chronic obstructive pulmonary disease, unspecified: Secondary | ICD-10-CM

## 2015-08-23 DIAGNOSIS — E1122 Type 2 diabetes mellitus with diabetic chronic kidney disease: Secondary | ICD-10-CM

## 2015-08-23 DIAGNOSIS — J441 Chronic obstructive pulmonary disease with (acute) exacerbation: Secondary | ICD-10-CM

## 2015-08-23 DIAGNOSIS — J9611 Chronic respiratory failure with hypoxia: Secondary | ICD-10-CM

## 2015-08-23 DIAGNOSIS — R531 Weakness: Secondary | ICD-10-CM | POA: Diagnosis not present

## 2015-08-23 DIAGNOSIS — E1129 Type 2 diabetes mellitus with other diabetic kidney complication: Secondary | ICD-10-CM | POA: Diagnosis not present

## 2015-08-23 DIAGNOSIS — B353 Tinea pedis: Secondary | ICD-10-CM

## 2015-08-23 DIAGNOSIS — R0602 Shortness of breath: Secondary | ICD-10-CM | POA: Diagnosis not present

## 2015-08-23 DIAGNOSIS — J439 Emphysema, unspecified: Secondary | ICD-10-CM | POA: Diagnosis not present

## 2015-08-23 DIAGNOSIS — R634 Abnormal weight loss: Secondary | ICD-10-CM

## 2015-08-23 MED ORDER — PREDNISONE 20 MG PO TABS: 20.0000 mg | ORAL_TABLET | Freq: Two times a day (BID) | ORAL | Status: DC

## 2015-08-23 MED ORDER — TERBINAFINE HCL 1 % EX CREA: 1.0000 "application " | TOPICAL_CREAM | Freq: Two times a day (BID) | CUTANEOUS | Status: DC

## 2015-08-23 MED ORDER — ALPRAZOLAM 0.25 MG PO TABS: 0.2500 mg | ORAL_TABLET | Freq: Every evening | ORAL | Status: DC | PRN

## 2015-08-23 MED ORDER — UMECLIDINIUM BROMIDE 62.5 MCG/INH IN AEPB: 1.0000 | INHALATION_SPRAY | Freq: Every day | RESPIRATORY_TRACT | Status: DC

## 2015-08-23 NOTE — Patient Instructions (Addendum)
Regarding the breathing issues, particularly with the weight loss, not feeling well rested, known COPD lung disease -   Increase oxygen to continuous use at home, 2 L during the day, 2.5-3 L at night  We are adding an inhaler called Incruse to take in addition to [facility]  We are getting a chest x-ray today, I will call you with the results of this. If it looks like pneumonia, we will treat with antibiotics  Continue the albuterol as needed for shortness of breath  We are adding a steroid taper to help breathing and inflammation  If you experience severe shortness of breath, go to the emergency room right away  We are cutting back on the alprazolam, I understand you have been on this medication along time and it's difficult to stop right away, but I don't think it safe for you to be on this medicine. We are cutting back to once a day, take only as absolutely needed.  Regarding the stomach issues, vomiting, and appetite problems -   We are ordering a test called a barium swallow, this is an x-ray where we swallow contrast and watch how it goes down into the stomach  Based on the results of this test, we may consider further imaging with a CAT scan or we may need to send to GI for an upper endoscopy  Would recommend supplementing with liquid shakes such as Ensure or Boost  Return to clinic in 1 week for weight check  For the feet We are treating with topical cream, let us know if this doesn't get better. Used twice daily for 3-4 weeks, or for 7 days after the scan looks totally normal. Change to clean socks 2-3 times per day  We are getting blood work today to make sure that you are not severely anemic, no kidney problems or other things which may be making you feel bad and causing these symptoms of weakness.

## 2015-08-23 NOTE — Progress Notes (Signed)
HPI: Tyler SUDBERRY Sr. is a 75 y.o. male who presents to [facility] today for chief complaint of:  Chief Complaint  Patient presents with  . Fatigue  . Foot Problem  . COPD  . Rash     WEAKNESS . Location: generalized  . Quality: greatest complaint is leg weakness. Eating causes early satiety, with solids and liquids. Night sweats. Getting up and not feeling well rested. Feels like may be getting pneumonia, Noticing more shortness of breath. . Severity: 12 lb weight loss in 3 weeks . Assoc signs/symptoms: night sweats, fatigue, low appetite. Feet are red and scaly.   SKIN ON FEET - hasn't been washing feet "we wanted you to see it" skin is red, flaky, very itchy  Past medical, social and family history reviewed: Past Medical History  Diagnosis Date  . COPD (chronic obstructive pulmonary disease) ([place])   . Allergy   . Diabetes mellitus   . Emphysema of lung ([place])   . GERD (gastroesophageal reflux disease)   . Hypertension   . Hyperlipidemia   . CAD (coronary artery disease)     a. s/p multiple PCIs to RCA and eventual CABG in 1999;  b.LHC 11/2003: CFX 95%, RCA occluded, SVG-RCA occluded, SVG-OM patent, native LAD patent. EF was 55%.  Patient had left to right collaterals and medical therapy was recommended    . Anxiety   . PVD (peripheral vascular disease) ([place])   . OA (osteoarthritis)   . Asthma   . Iron deficiency anemia   . Memory loss   . Insomnia   . DDD (degenerative disc disease), lumbar   . OSA (obstructive sleep apnea)   . Depression   . Benign prostatic hypertrophy   . Lumbar back pain     chronic  . Spinal stenosis   . Herniated disc   . History of colonic polyps   . Diverticulitis, colon   . Esophageal stricture   . Hx of colonoscopy   . Hiatal hernia   . HOH (hard of hearing)    Past Surgical History  Procedure Laterality Date  . Heart bypass    . Bilateral knee replacements    . Hemorrhoid surgery    . Carpal  tunnel release    . Shoulder arthroscopy  right  . Coronary artery bypass graft  09/1997    double bypss  . Coronary angioplasty with stent placement  2/99  . Strabismus surgery  08/29/2011    Procedure: REPAIR STRABISMUS;  Surgeon: Derry Skill, MD;  Location: [facility];  Service: Ophthalmology;  Laterality: Left;   Social History  Substance Use Topics  . Smoking status: Current Every Day Smoker -- 1.00 packs/day for 40 years    Types: Cigarettes  . Smokeless tobacco: Never Used  . Alcohol Use: No   Family History  Problem Relation Age of Onset  . Stroke Father   . Emphysema Father   . Heart disease    . Hypertension    . Hyperlipidemia    . Kidney cancer Daughter   . Allergies    . Colon cancer Neg Hx   . Diabetes      Current Outpatient Prescriptions  Medication Sig Dispense Refill  . albuterol (VENTOLIN HFA) 108 (90 BASE) MCG/ACT inhaler Inhale 2 puffs into the lungs every 6 (six) hours as needed for wheezing or shortness of breath. 1 Inhaler 11  . ALPRAZolam (XANAX) 0.25 MG tablet TAKE 1 TABLET BY MOUTH THREE TIMES A  DAY IF NEEDED 30 tablet 0  . AMBULATORY NON FORMULARY MEDICATION 2.5 Liters home oxygen continuous    . Aspirin-Salicylamide-Caffeine (BC HEADACHE POWDER PO) Take 1 Package by mouth 4 (four) times daily as needed (headache or pain).    Marland Kitchen donepezil (ARICEPT) 10 MG tablet Take 1 tablet (10 mg total) by mouth at bedtime. 30 tablet 5  . DULoxetine (CYMBALTA) 60 MG capsule take 1 capsule by mouth once daily 30 capsule 5  . fluticasone (FLONASE) 50 MCG/ACT nasal spray instill 2 sprays into each nostril once daily 16 g 11  . Fluticasone Furoate-Vilanterol (BREO ELLIPTA) 100-25 MCG/INH AEPB Inhale 1 puff into the lungs daily. 1 each 11  . glipiZIDE (GLUCOTROL XL) 2.5 MG 24 hr tablet Take 2.5 mg by mouth daily. Take only if blood sugar is greater than 180    . HYDROcodone-acetaminophen (NORCO) 10-325 MG tablet take 1 tablet by mouth every 4 to 6 hours if needed for pain  (NO MORE THAN 5 PER DAY)  0  . lamoTRIgine (LAMICTAL) 25 MG tablet take 1 tablet at bedtime for 14 days then 1 tablet twice a day  0  . metFORMIN (GLUCOPHAGE) 500 MG tablet TAKE 2 TABLETS BY MOUTH TWICE A DAY 360 tablet 1  . Multiple Vitamin (MULTIVITAMIN) capsule Take 1 capsule by mouth daily.     . nitroGLYCERIN (NITROSTAT) 0.4 MG SL tablet Place 1 tablet (0.4 mg total) under the tongue every 5 (five) minutes as needed. For chest pain 25 tablet 6  . omeprazole (PRILOSEC) 20 MG capsule take 1 capsule by mouth twice a day before meals 60 capsule 11  . pravastatin (PRAVACHOL) 80 MG tablet take 1 tablet by mouth once daily 90 tablet 3  . traMADol (ULTRAM) 50 MG tablet 1-2 tabs by mouth Q8 hours, maximum 6 tabs per day. 40 tablet 0  . zolpidem (AMBIEN) 10 MG tablet Take 10 mg by mouth at bedtime as needed. For sleep     No current facility-administered medications for this visit.   Allergies  Allergen Reactions  . Celecoxib Nausea And Vomiting    REACTION: GI upset  . Chicken Protein Other (See Comments)    Will not eat chicken  . Fish-Derived Products Other (See Comments)    Will not eat seafood of any kind  . Morphine Itching    REACTION: itching  . Nalbuphine Other (See Comments)    REACTION: contraindication with oxycontin.  . Prednisone Other (See Comments)    REACTION: "yeast infections"  . Venlafaxine Other (See Comments)    REACTION: GI upset      Review of Systems: CONSTITUTIONAL:  No  fever, no chills, (+) unintentional weight changes, (+) night sweats CARDIAC: No  chest pain, No  pressure, No palpitations, No  orthopnea RESPIRATORY: (+) chfronic cough, (+) chronic shortness of breath/wheeze GASTROINTESTINAL: No  nausea, (+) early satiety and vomiting, No  abdominal pain, No  blood in stool, No  diarrhea, No  constipation  SKIN: (+) rash/wounds/concerning lesions NEUROLOGIC: (+) weakness, No  dizziness, No  slurred speech PSYCHIATRIC: No  concerns with depression, No   concerns with anxiety, (+) sleep problems  Exam:  BP 134/87 mmHg  Pulse 101  Ht '5\' 6"'  (1.676 m)  Wt 143 lb (64.864 kg)  BMI 23.09 kg/m2  SpO2 84%  Constitutional: VS see above. General Appearance: alert, well-developed, well-nourished, NAD Eyes: Normal lids and conjunctive, non-icteric sclera Ears, Nose, Mouth, Throat: MMM Neck: No masses, trachea midline. No thyroid enlargement/tenderness/mass appreciated. No  lymphadenopathy Respiratory: Normal respiratory effort. (+) diffuse bilateral wheeze, (+) faint bilateral rhonchi, no rales - consistent with previous exams Cardiovascular: S1/S2 normal, no murmur, no rub/gallop auscultated. RRR. No lower extremity edema. Gastrointestinal: Nontender, no masses. No hepatomegaly, no splenomegaly. No hernia appreciated. Bowel sounds normal. Rectal exam deferred.  Skin: warm, dry, intact. See below - erythematous rash of feet with flaki skin and distinct border c/w severe tinea - digits are warm, no edema, nontender. No concerning nevi or subq nodules on limited exam.   Psychiatric: Normal judgment/insight. Normal mood and affect. Oriented x3.          Results for orders placed or performed in visit on 08/23/15 (from the past 72 hour(s))  CBC with Differential/Platelet     Status: Abnormal   Collection Time: 08/23/15  4:45 PM  Result Value Ref Range   WBC 9.4 3.8 - 10.8 K/uL   RBC 4.22 4.20 - 5.80 MIL/uL   Hemoglobin 13.0 (L) 13.2 - 17.1 g/dL   HCT 39.9 38.5 - 50.0 %   MCV 94.5 80.0 - 100.0 fL   MCH 30.8 27.0 - 33.0 pg   MCHC 32.6 32.0 - 36.0 g/dL   RDW 16.7 (H) 11.0 - 15.0 %   Platelets 422 (H) 140 - 400 K/uL   MPV 8.8 7.5 - 12.5 fL   Neutro Abs 6486 1500 - 7800 cells/uL   Lymphs Abs 1880 850 - 3900 cells/uL   Monocytes Absolute 846 200 - 950 cells/uL   Eosinophils Absolute 188 15 - 500 cells/uL   Basophils Absolute 0 0 - 200 cells/uL   Neutrophils Relative % 69 %   Lymphocytes Relative 20 %   Monocytes Relative 9 %   Eosinophils  Relative 2 %   Basophils Relative 0 %   Smear Review Criteria for review not met     Comment: ** Please note change in unit of measure and reference range(s). **  COMPLETE METABOLIC PANEL WITH GFR     Status: Abnormal   Collection Time: 08/23/15  4:45 PM  Result Value Ref Range   Sodium 138 135 - 146 mmol/L   Potassium 5.5 (H) 3.5 - 5.3 mmol/L    Comment: No visible hemolysis.   Chloride 105 98 - 110 mmol/L   CO2 18 (L) 20 - 31 mmol/L   Glucose, Bld 92 65 - 99 mg/dL   BUN 21 7 - 25 mg/dL   Creat 1.61 (H) 0.70 - 1.18 mg/dL   Total Bilirubin 0.2 0.2 - 1.2 mg/dL   Alkaline Phosphatase 100 40 - 115 U/L   AST 15 10 - 35 U/L   ALT 9 9 - 46 U/L   Total Protein 7.7 6.1 - 8.1 g/dL   Albumin 4.2 3.6 - 5.1 g/dL   Calcium 9.7 8.6 - 10.3 mg/dL   GFR, Est African American 49 (L) >=60 mL/min   GFR, Est Non African American 42 (L) >=60 mL/min    Comment:   The estimated GFR is a calculation valid for adults (>=67 years old) that uses the CKD-EPI algorithm to adjust for age and sex. It is   not to be used for children, pregnant women, hospitalized patients,    patients on dialysis, or with rapidly changing kidney function. According to the NKDEP, eGFR >89 is normal, 60-89 shows mild impairment, 30-59 shows moderate impairment, 15-29 shows severe impairment and <15 is ESRD.     Hemoglobin A1c     Status: Abnormal   Collection Time: 08/23/15  4:45 PM  Result Value Ref Range   Hgb A1c MFr Bld 5.8 (H) <5.7 %    Comment:   For someone without known diabetes, a hemoglobin A1c value between 5.7% and 6.4% is consistent with prediabetes and should be confirmed with a follow-up test.   For someone with known diabetes, a value <7% indicates that their diabetes is well controlled. A1c targets should be individualized based on duration of diabetes, age, co-morbid conditions and other considerations.   This assay result is consistent with an increased risk of diabetes.   Currently, no consensus  exists regarding use of hemoglobin A1c for diagnosis of diabetes in children.      Mean Plasma Glucose 120 mg/dL  TSH     Status: None   Collection Time: 08/23/15  4:45 PM  Result Value Ref Range   TSH 0.87 0.40 - 4.50 mIU/L   Dg Chest 2 View  08/23/2015  CLINICAL DATA:  Shortness of breath today. History of emphysema. Initial encounter. EXAM: CHEST  2 VIEW COMPARISON:  CT chest 04/21/2014.  PA and lateral chest 10/15/2013. FINDINGS: The lungs are emphysematous but clear. Heart size is normal. No pneumothorax or pleural effusion. Small hiatal hernia is noted. No focal bony abnormality. The patient is status post CABG. IMPRESSION: Emphysema without acute disease. Electronically Signed   By: Inge Rise M.D.   On: 08/23/2015 17:05     ASSESSMENT/PLAN: See patient instructions which were printed and reviewed with him prior to discharge.  Oxygen is low but patient is conversing in full sentences, appears about at baseline from what I have seen him at before. He is absolutely not interested in going to the ER. Ideally, would send to GI for scope but patient is resistant to this, he is amenable to a barium swallow so we will go ahead and get this. I have low suspicion for pneumonia though certainly will go ahead and treat as COPD exacerbation. He is resistant to consultation with pulmonology, I only recently convinced him to get on inhalers, I let him know that I do have a suspicion of possible lung cancer on my list of differential diagnoses which may be causing his problems. ER/RTC precautions reviewed at length with the patient and his wife.   After lab review, CKD bit worse, Potassium elevated, plan to repeat this Monday - spoke to wife to let er know results and plan to get CT by next week (may take time with prior auth) and stop diabetes medicines.   General weakness - Plan: CBC with Differential/Platelet, COMPLETE METABOLIC PANEL WITH GFR, TSH, CT Chest W Contrast, CT Abdomen Pelvis W  Contrast  Type 2 diabetes mellitus with stage 2 chronic kidney disease, without long-term current use of insulin (HCC) - Plan: Hemoglobin A1c  COPD, severe (HCC) - Plan: umeclidinium bromide (INCRUSE ELLIPTA) 62.5 MCG/INH AEPB, DG Chest 2 View, predniSONE (DELTASONE) 20 MG tablet, CT Chest W Contrast  Other dysphagia - Plan: CANCELED: DG Esophagus  Tinea pedis of both feet - Plan: terbinafine (LAMISIL AT) 1 % cream  Early satiety - Plan: CT Abdomen Pelvis W Contrast  Loss of weight - Plan: CT Chest W Contrast, CT Abdomen Pelvis W Contrast  Shortness of breath - Plan: CT Chest W Contrast   All questions were answered. Visit summary with updated medication list and pertinent instructions was printed for patient. ER/RTC precautions were reviewed with the patient. Return in about 1 week (around 08/30/2015) for WEIGHT CHECK, FOLLOW UP APPETITE AND BREATHING (OV30).

## 2015-08-24 DIAGNOSIS — J441 Chronic obstructive pulmonary disease with (acute) exacerbation: Secondary | ICD-10-CM | POA: Insufficient documentation

## 2015-08-24 DIAGNOSIS — E440 Moderate protein-calorie malnutrition: Secondary | ICD-10-CM | POA: Insufficient documentation

## 2015-08-24 LAB — CBC WITH DIFFERENTIAL/PLATELET
BASOS PCT: 0 %
Basophils Absolute: 0 cells/uL (ref 0–200)
Eosinophils Absolute: 188 cells/uL (ref 15–500)
Eosinophils Relative: 2 %
HEMATOCRIT: 39.9 % (ref 38.5–50.0)
Hemoglobin: 13 g/dL — ABNORMAL LOW (ref 13.2–17.1)
LYMPHS ABS: 1880 {cells}/uL (ref 850–3900)
LYMPHS PCT: 20 %
MCH: 30.8 pg (ref 27.0–33.0)
MCHC: 32.6 g/dL (ref 32.0–36.0)
MCV: 94.5 fL (ref 80.0–100.0)
MONO ABS: 846 {cells}/uL (ref 200–950)
MPV: 8.8 fL (ref 7.5–12.5)
Monocytes Relative: 9 %
Neutro Abs: 6486 cells/uL (ref 1500–7800)
Neutrophils Relative %: 69 %
PLATELETS: 422 10*3/uL — AB (ref 140–400)
RBC: 4.22 MIL/uL (ref 4.20–5.80)
RDW: 16.7 % — AB (ref 11.0–15.0)
WBC: 9.4 10*3/uL (ref 3.8–10.8)

## 2015-08-24 LAB — COMPLETE METABOLIC PANEL WITH GFR
ALT: 9 U/L (ref 9–46)
AST: 15 U/L (ref 10–35)
Albumin: 4.2 g/dL (ref 3.6–5.1)
Alkaline Phosphatase: 100 U/L (ref 40–115)
BUN: 21 mg/dL (ref 7–25)
CALCIUM: 9.7 mg/dL (ref 8.6–10.3)
CHLORIDE: 105 mmol/L (ref 98–110)
CO2: 18 mmol/L — AB (ref 20–31)
Creat: 1.61 mg/dL — ABNORMAL HIGH (ref 0.70–1.18)
GFR, EST AFRICAN AMERICAN: 49 mL/min — AB (ref >=60)
GFR, EST NON AFRICAN AMERICAN: 42 mL/min — AB (ref >=60)
Glucose, Bld: 92 mg/dL (ref 65–99)
POTASSIUM: 5.5 mmol/L — AB (ref 3.5–5.3)
Sodium: 138 mmol/L (ref 135–146)
Total Bilirubin: 0.2 mg/dL (ref 0.2–1.2)
Total Protein: 7.7 g/dL (ref 6.1–8.1)

## 2015-08-24 LAB — HEMOGLOBIN A1C
HEMOGLOBIN A1C: 5.8 % — AB (ref <5.7)
MEAN PLASMA GLUCOSE: 120 mg/dL

## 2015-08-24 LAB — TSH: TSH: 0.87 m[IU]/L (ref 0.40–4.50)

## 2015-08-29 ENCOUNTER — Ambulatory Visit (HOSPITAL_BASED_OUTPATIENT_CLINIC_OR_DEPARTMENT_OTHER)
Admission: RE | Admit: 2015-08-29 | Discharge: 2015-08-29 | Disposition: A | Discharge status: Home / Self Care | Payer: Medicare Other | Source: Ambulatory Visit | Attending: Osteopathic Medicine | Admitting: Osteopathic Medicine

## 2015-08-29 ENCOUNTER — Encounter (HOSPITAL_BASED_OUTPATIENT_CLINIC_OR_DEPARTMENT_OTHER): Payer: Self-pay

## 2015-08-29 DIAGNOSIS — R911 Solitary pulmonary nodule: Secondary | ICD-10-CM | POA: Insufficient documentation

## 2015-08-29 DIAGNOSIS — N281 Cyst of kidney, acquired: Secondary | ICD-10-CM | POA: Diagnosis not present

## 2015-08-29 DIAGNOSIS — R6881 Early satiety: Secondary | ICD-10-CM | POA: Insufficient documentation

## 2015-08-29 DIAGNOSIS — R634 Abnormal weight loss: Secondary | ICD-10-CM

## 2015-08-29 DIAGNOSIS — J449 Chronic obstructive pulmonary disease, unspecified: Secondary | ICD-10-CM

## 2015-08-29 DIAGNOSIS — K5641 Fecal impaction: Secondary | ICD-10-CM | POA: Insufficient documentation

## 2015-08-29 DIAGNOSIS — R531 Weakness: Secondary | ICD-10-CM | POA: Diagnosis not present

## 2015-08-29 DIAGNOSIS — K6389 Other specified diseases of intestine: Secondary | ICD-10-CM | POA: Insufficient documentation

## 2015-08-29 DIAGNOSIS — IMO0001 Reserved for inherently not codable concepts without codable children: Secondary | ICD-10-CM | POA: Insufficient documentation

## 2015-08-29 DIAGNOSIS — M47815 Spondylosis without myelopathy or radiculopathy, thoracolumbar region: Secondary | ICD-10-CM | POA: Diagnosis not present

## 2015-08-29 DIAGNOSIS — R0602 Shortness of breath: Secondary | ICD-10-CM

## 2015-08-29 MED ORDER — IOPAMIDOL (ISOVUE-300) INJECTION 61%
80.0000 mL | Freq: Once | INTRAVENOUS | Status: AC | PRN
  Administered 2015-08-29: 80 mL via INTRAVENOUS

## 2015-08-30 ENCOUNTER — Encounter: Payer: Self-pay | Admitting: Osteopathic Medicine

## 2015-08-30 ENCOUNTER — Ambulatory Visit (INDEPENDENT_AMBULATORY_CARE_PROVIDER_SITE_OTHER): Payer: Medicare Other | Admitting: Osteopathic Medicine

## 2015-08-30 VITALS — BP 125/75 | HR 60 | Ht 66.0 in | Wt 154.0 lb

## 2015-08-30 DIAGNOSIS — R531 Weakness: Secondary | ICD-10-CM

## 2015-08-30 DIAGNOSIS — N183 Chronic kidney disease, stage 3 unspecified: Secondary | ICD-10-CM

## 2015-08-30 DIAGNOSIS — N189 Chronic kidney disease, unspecified: Secondary | ICD-10-CM | POA: Insufficient documentation

## 2015-08-30 DIAGNOSIS — R634 Abnormal weight loss: Secondary | ICD-10-CM

## 2015-08-30 DIAGNOSIS — Z8719 Personal history of other diseases of the digestive system: Secondary | ICD-10-CM

## 2015-08-30 DIAGNOSIS — E1159 Type 2 diabetes mellitus with other circulatory complications: Secondary | ICD-10-CM

## 2015-08-30 DIAGNOSIS — R933 Abnormal findings on diagnostic imaging of other parts of digestive tract: Secondary | ICD-10-CM

## 2015-08-30 DIAGNOSIS — E875 Hyperkalemia: Secondary | ICD-10-CM

## 2015-08-30 DIAGNOSIS — K449 Diaphragmatic hernia without obstruction or gangrene: Secondary | ICD-10-CM

## 2015-08-30 DIAGNOSIS — J449 Chronic obstructive pulmonary disease, unspecified: Secondary | ICD-10-CM | POA: Diagnosis not present

## 2015-08-30 DIAGNOSIS — R6881 Early satiety: Secondary | ICD-10-CM

## 2015-08-30 NOTE — Progress Notes (Signed)
HPI: Tyler KRAATZ Sr. is a 75 y.o. male who presents to [facility] today for chief complaint of:  Chief Complaint  Patient presents with  . Follow-up    Fatigue, weight check, night sweats, dysphagia     WEAKNESS . Location: generalized  . Quality: Overall feeling a bit better from last week, he was treated at that point for COPD exacerbation . Severity: 12 lb weight loss in 3 weeks at last visit, weight has rebounded today. . Assoc signs/symptoms: night sweats are a bit better but still having fatigue, low appetite.   SKIN ON FEET - better since treatment with antifungal  DYSPHAGIA/EARLY SATIETY - CT results reviewed at length with the patient, records from GI were also reviewed as far back as 2013. Patient has history of esophageal stricture and hiatal hernia, at last visit was advised he should probably go to GI but he was resistant to doing this. In light of weakness, weight loss, night sweats, we opted to get CT scan. Hiatal hernia and abnormal finding on sigmoid colon were noted in the radiology report, images were also personally reviewed by myself. Patient states he is still having some occasional difficulty swallowing/eating, not necessarily painful, occasional heartburn issues  COPD/BREATHING - patient was treated for COPD exacerbation last week, oxygen saturation is better today, patient reports that her breathing. Did not need antibiotics    Past medical, social and family history reviewed: Past Medical History  Diagnosis Date  . COPD (chronic obstructive pulmonary disease) ([place])   . Allergy   . Diabetes mellitus   . Emphysema of lung ([place])   . GERD (gastroesophageal reflux disease)   . Hypertension   . Hyperlipidemia   . CAD (coronary artery disease)     a. s/p multiple PCIs to RCA and eventual CABG in 1999;  b.LHC 11/2003: CFX 95%, RCA occluded, SVG-RCA occluded, SVG-OM patent, native LAD patent. EF was 55%.  Patient had left to  right collaterals and medical therapy was recommended    . Anxiety   . PVD (peripheral vascular disease) ([place])   . OA (osteoarthritis)   . Asthma   . Iron deficiency anemia   . Memory loss   . Insomnia   . DDD (degenerative disc disease), lumbar   . OSA (obstructive sleep apnea)   . Depression   . Benign prostatic hypertrophy   . Lumbar back pain     chronic  . Spinal stenosis   . Herniated disc   . History of colonic polyps   . Diverticulitis, colon   . Esophageal stricture   . Hx of colonoscopy   . Hiatal hernia   . HOH (hard of hearing)    Past Surgical History  Procedure Laterality Date  . Heart bypass    . Bilateral knee replacements    . Hemorrhoid surgery    . Carpal tunnel release    . Shoulder arthroscopy  right  . Coronary artery bypass graft  09/1997    double bypss  . Coronary angioplasty with stent placement  2/99  . Strabismus surgery  08/29/2011    Procedure: REPAIR STRABISMUS;  Surgeon: Derry Skill, MD;  Location: [facility];  Service: Ophthalmology;  Laterality: Left;   Social History  Substance Use Topics  . Smoking status: Current Every Day Smoker -- 1.00 packs/day for 40 years    Types: Cigarettes  . Smokeless tobacco: Never Used  . Alcohol Use: No   Family History  Problem Relation  Age of Onset  . Stroke Father   . Emphysema Father   . Heart disease    . Hypertension    . Hyperlipidemia    . Kidney cancer Daughter   . Allergies    . Colon cancer Neg Hx   . Diabetes      Current Outpatient Prescriptions  Medication Sig Dispense Refill  . albuterol (VENTOLIN HFA) 108 (90 BASE) MCG/ACT inhaler Inhale 2 puffs into the lungs every 6 (six) hours as needed for wheezing or shortness of breath. 1 Inhaler 11  . ALPRAZolam (XANAX) 0.25 MG tablet Take 1 tablet (0.25 mg total) by mouth at bedtime as needed for anxiety or sleep (USE SPARINGLY - THIS MEDICINE CAN AFFECT YOUR BREATHING AND CAN CAUSE SEDATION AND WEAKNESS). TAKE 1 TABLET BY MOUTH THREE TIMES  A DAY IF NEEDED 30 tablet 0  . AMBULATORY NON FORMULARY MEDICATION 2.5 Liters home oxygen continuous    . Aspirin-Salicylamide-Caffeine (BC HEADACHE POWDER PO) Take 1 Package by mouth 4 (four) times daily as needed (headache or pain).    Marland Kitchen donepezil (ARICEPT) 10 MG tablet Take 1 tablet (10 mg total) by mouth at bedtime. 30 tablet 5  . DULoxetine (CYMBALTA) 30 MG capsule     . DULoxetine (CYMBALTA) 60 MG capsule take 1 capsule by mouth once daily 30 capsule 5  . fluticasone (FLONASE) 50 MCG/ACT nasal spray instill 2 sprays into each nostril once daily 16 g 11  . Fluticasone Furoate-Vilanterol (BREO ELLIPTA) 100-25 MCG/INH AEPB Inhale 1 puff into the lungs daily. 1 each 11  . HYDROcodone-acetaminophen (NORCO) 10-325 MG tablet take 1 tablet by mouth every 4 to 6 hours if needed for pain (NO MORE THAN 5 PER DAY)  0  . lamoTRIgine (LAMICTAL) 25 MG tablet take 1 tablet at bedtime for 14 days then 1 tablet twice a day  0  . Multiple Vitamin (MULTIVITAMIN) capsule Take 1 capsule by mouth daily.     . nitroGLYCERIN (NITROSTAT) 0.4 MG SL tablet Place 1 tablet (0.4 mg total) under the tongue every 5 (five) minutes as needed. For chest pain 25 tablet 6  . omeprazole (PRILOSEC) 20 MG capsule take 1 capsule by mouth twice a day before meals 60 capsule 11  . pravastatin (PRAVACHOL) 80 MG tablet take 1 tablet by mouth once daily 90 tablet 3  . terbinafine (LAMISIL AT) 1 % cream Apply 1 application topically 2 (two) times daily. 30 g 0  . traMADol (ULTRAM) 50 MG tablet 1-2 tabs by mouth Q8 hours, maximum 6 tabs per day. 40 tablet 0  . umeclidinium bromide (INCRUSE ELLIPTA) 62.5 MCG/INH AEPB Inhale 1 puff into the lungs daily. 7 each 3  . zolpidem (AMBIEN) 10 MG tablet Take 10 mg by mouth at bedtime as needed. For sleep     No current facility-administered medications for this visit.   Allergies  Allergen Reactions  . Celecoxib Nausea And Vomiting    REACTION: GI upset  . Chicken Protein Other (See Comments)     Will not eat chicken  . Fish-Derived Products Other (See Comments)    Will not eat seafood of any kind  . Morphine Itching    REACTION: itching  . Nalbuphine Other (See Comments)    REACTION: contraindication with oxycontin.  . Prednisone Other (See Comments)    REACTION: "yeast infections"  . Venlafaxine Other (See Comments)    REACTION: GI upset      Review of Systems: CONSTITUTIONAL:  No  fever, no chills, (+)  unintentional weight changes, (+) night sweats CARDIAC: No  chest pain, No  pressure, No palpitations, No  orthopnea RESPIRATORY: (+) chfronic cough, (+) chronic shortness of breath/wheeze GASTROINTESTINAL: No  nausea, (+) early satiety and vomiting, No  abdominal pain, No  blood in stool, No  diarrhea, No  constipation  SKIN: (+) rash/wounds/concerning lesions NEUROLOGIC: (+) weakness, No  dizziness, No  slurred speech PSYCHIATRIC: No  concerns with depression, No  concerns with anxiety, (+) sleep problems  Exam:  BP 125/75 mmHg  Pulse 60  Ht 5\' 6"  (1.676 m)  Wt 154 lb (69.854 kg)  BMI 24.87 kg/m2  SpO2 92%  Constitutional: VS see above. General Appearance: alert, well-developed, well-nourished, NAD Respiratory: Normal respiratory effort. (+) diffuse bilateral wheeze, (+) faint bilateral rhonchi, no rales - consistent with previous exams Cardiovascular: S1/S2 normal, no murmur, no rub/gallop auscultated. RRR. No lower extremity edema. Gastrointestinal: Nontender, no masses.  Psychiatric: Normal judgment/insight. Normal mood and affect. Oriented x3.          No results found for this or any previous visit (from the past 72 hour(s)). Dg Chest 2 View  08/23/2015  CLINICAL DATA:  Shortness of breath today. History of emphysema. Initial encounter. EXAM: CHEST  2 VIEW COMPARISON:  CT chest 04/21/2014.  PA and lateral chest 10/15/2013. FINDINGS: The lungs are emphysematous but clear. Heart size is normal. No pneumothorax or pleural effusion. Small hiatal hernia is  noted. No focal bony abnormality. The patient is status post CABG. IMPRESSION: Emphysema without acute disease. Electronically Signed   By: Inge Rise M.D.   On: 08/23/2015 17:05   Ct Chest, abdomen, pelvis W Contrast  08/29/2015  CLINICAL DATA:  Generalized weakness. Severe COPD. Loss of weight. Shortness of breath. Night sweats. EXAM: CT CHEST, ABDOMEN, AND PELVIS WITH CONTRAST TECHNIQUE: Multidetector CT imaging of the chest, abdomen and pelvis was performed following the standard protocol during bolus administration of intravenous contrast. CONTRAST:  64mL ISOVUE-300 IOPAMIDOL (ISOVUE-300) INJECTION 61% COMPARISON:  Two-view chest x-ray 08/23/2015 CT chest 04/21/2014. CT abdomen and pelvis 11/24/2013. FINDINGS: CT CHEST FINDINGS Mediastinum/Lymph Nodes: Scattered sub cm peritracheal lymph nodes are present. There are no pathologically enlarged nodes. No significant axillary adenopathy is present. The heart size is normal. Median sternotomy for CABG is noted. Coronary artery calcifications and stents are noted. There is no significant pericardial or pleural effusion. The thoracic inlet is within normal limits. A large hiatal hernia is present. Lungs/Pleura: Centrilobular and paraseptal emphysema is present. Diffuse bronchial wall thickening is again noted. Tree-in-bud appearance is again noted within the lingula and right lower lobe. No other focal nodule or mass lesion is present. A 4-5 mm nodule in the right upper lobe is stable. Musculoskeletal: Vertebral body heights alignment are maintained. Schmorl's nodes are stable. No focal lytic or blastic lesions are evident. CT ABDOMEN PELVIS FINDINGS Hepatobiliary: The liver, common bile duct, and gallbladder are normal. Pancreas: Pancreas is unremarkable. Spleen: The spleen is within normal limits. Adrenals/Urinary Tract: The adrenal glands are normal bilaterally. A 2.6 cm simple cyst at the upper pole of the right kidney demonstrates some interval growth.  The kidneys and ureters are otherwise within normal limits. The urinary bladder is normal. Stomach/Bowel: A large hiatal hernia is again noted. The stomach and duodenum are otherwise within normal limits. The small bowel is unremarkable. The appendix is visualized and normal. Moderate stool is present throughout the ascending and transverse colon. There is moderate stool mild distension in the descending colon as well. A 6  cm portion of sigmoid colon is more collapsed. No discrete mass lesion is present. Normal caliber stool is present beyond this level. Vascular/Lymphatic: Atherosclerotic calcifications are present throughout the abdominal aorta and branch vessels. There is no significant adenopathy. There is no aneurysm. Reproductive: Within normal limits. Other: No significant free fluid is present. Musculoskeletal: Levoconvex curvature is present in the thoracolumbar spine, centered at L1. Slight rightward curvature is present at L4-5. Asymmetric left-sided facet hypertrophy is present. Slight retrolisthesis is present at L4-5 and L3-4. No focal lytic or blastic lesions are present. The pelvis is intact. IMPRESSION: 1. Majority of the colon is mildly distended and filled with stool. 2. There is an abrupt transition in the sigmoid colon with a more narrowed area over 6 cm. No discrete mass lesion is present. Early colon cancer is not excluded. Colonoscopy or sigmoidoscopy is recommended for further evaluation. The area in question is within 30 cm of the rectum. 3. 4-5 mm nodule in the right upper lobe is stable and does not require further follow-up. 4. Emphysema. 5. Chronic tree-in-bud appearance in the lingula and right lower lobe likely reflecting chronic inflammation. 6. Enlarging simple cyst at the upper pole of the right kidney. 7. Multilevel spondylosis in the thoracolumbar spine. Electronically Signed   By: San Morelle M.D.   On: 08/29/2015 15:20    ASSESSMENT/PLAN: Weight and energy are  improving a bit, given hiatal hernia and abnormal sigmoid colon as noted on CT, as well as history of esophageal stricture with dilation, patient is at this point amenable to GI referral, for consideration of EGD/sigmoidoscopy or colonoscopy. We'll recheck labs with regard to previous measurement of hyperkalemia and decreased kidney function from baseline. Reiterated the patient's A1c was well within goal, he can stop oral medications for diabetes.  Loss of weight - Plan: Ambulatory referral to Gastroenterology  General weakness  Chronic obstructive pulmonary disease, unspecified COPD type ([place])  Type 2 diabetes mellitus with other circulatory complication ([facility])  Hyperkalemia - Plan: BASIC METABOLIC PANEL WITH GFR  Chronic kidney disease, stage 3 (moderate) - Plan: BASIC METABOLIC PANEL WITH GFR  History of esophageal stricture  Abnormal CT scan, sigmoid colon - Plan: Ambulatory referral to Gastroenterology  Early satiety - Plan: Ambulatory referral to Gastroenterology  Hiatal hernia - Plan: Ambulatory referral to Gastroenterology   All questions were answered. Visit summary with updated medication list and pertinent instructions was printed for patient. ER/RTC precautions were reviewed with the patient. Return in about 3 months (around 11/30/2015), or sooner if needed, for DIABETES FOLLOW-UP, [place] VISIT.

## 2015-08-31 ENCOUNTER — Encounter: Payer: Self-pay | Admitting: Osteopathic Medicine

## 2015-08-31 ENCOUNTER — Ambulatory Visit (INDEPENDENT_AMBULATORY_CARE_PROVIDER_SITE_OTHER): Payer: Medicare Other | Admitting: Osteopathic Medicine

## 2015-08-31 ENCOUNTER — Other Ambulatory Visit: Payer: Self-pay | Admitting: Sports Medicine

## 2015-08-31 ENCOUNTER — Telehealth: Payer: Self-pay | Admitting: Internal Medicine

## 2015-08-31 VITALS — BP 142/89 | HR 98

## 2015-08-31 DIAGNOSIS — N183 Chronic kidney disease, stage 3 unspecified: Secondary | ICD-10-CM

## 2015-08-31 DIAGNOSIS — E875 Hyperkalemia: Secondary | ICD-10-CM | POA: Diagnosis not present

## 2015-08-31 LAB — COMPLETE METABOLIC PANEL WITH GFR
ALBUMIN: 3.6 g/dL (ref 3.6–5.1)
ALT: 19 U/L (ref 9–46)
AST: 16 U/L (ref 10–35)
Alkaline Phosphatase: 92 U/L (ref 40–115)
BUN: 34 mg/dL — AB (ref 7–25)
CO2: 25 mmol/L (ref 20–31)
CREATININE: 1.41 mg/dL — AB (ref 0.70–1.18)
Calcium: 9 mg/dL (ref 8.6–10.3)
Chloride: 102 mmol/L (ref 98–110)
GFR, Est African American: 57 mL/min — ABNORMAL LOW (ref >=60)
GFR, Est Non African American: 49 mL/min — ABNORMAL LOW (ref >=60)
GLUCOSE: 139 mg/dL — AB (ref 65–99)
POTASSIUM: 6.1 mmol/L — AB (ref 3.5–5.3)
SODIUM: 139 mmol/L (ref 135–146)
TOTAL PROTEIN: 6.3 g/dL (ref 6.1–8.1)
Total Bilirubin: 0.2 mg/dL (ref 0.2–1.2)

## 2015-08-31 LAB — BASIC METABOLIC PANEL WITH GFR
BUN: 37 mg/dL — ABNORMAL HIGH (ref 7–25)
CHLORIDE: 102 mmol/L (ref 98–110)
CO2: 29 mmol/L (ref 20–31)
CREATININE: 1.39 mg/dL — AB (ref 0.70–1.18)
Calcium: 9.4 mg/dL (ref 8.6–10.3)
GFR, EST NON AFRICAN AMERICAN: 50 mL/min — AB (ref >=60)
GFR, Est African American: 58 mL/min — ABNORMAL LOW (ref >=60)
Glucose, Bld: 118 mg/dL — ABNORMAL HIGH (ref 65–99)
POTASSIUM: 6 mmol/L — AB (ref 3.5–5.3)
SODIUM: 139 mmol/L (ref 135–146)

## 2015-08-31 MED ORDER — SODIUM POLYSTYRENE SULFONATE PO POWD: Freq: Once | ORAL | Status: DC

## 2015-08-31 MED ORDER — FUROSEMIDE 20 MG PO TABS: 10.0000 mg | ORAL_TABLET | Freq: Two times a day (BID) | ORAL | Status: DC

## 2015-08-31 NOTE — Telephone Encounter (Signed)
Patient notified of date and time.  

## 2015-08-31 NOTE — Progress Notes (Signed)
Hyperkalemia in a patient with stage 3 kidney disease,recent ECG in the office today showed no peaked t-waves, patient has declined a trip to the emergency department, we are going to treat the patient with kayexalate one dose po x 1 and patient will return to the office in the morning.

## 2015-08-31 NOTE — Addendum Note (Signed)
Addended by: Doree Albee on: 08/31/2015 04:17 PM   Modules accepted: Orders

## 2015-08-31 NOTE — Patient Instructions (Signed)
Come back Tuesday for labs in the morning by 9:00 We are getting labs today - if potassium is at a dangerous level, we may ask you to go to the ER.

## 2015-08-31 NOTE — Progress Notes (Signed)
HPI: Tyler COCCIA Sr. is a 75 y.o. male who presents to [facility] today for chief complaint of:  Chief Complaint  Patient presents with  . Follow-up    LABS     Hyperkalemia: last week K was 5.5, repeated yesterday, reviewed results today and K was 6, had patient come in for EKG and further evaluation as well as repeat lab. She reports some increased fatigue today, otherwise no significant change or new concern  Past medical, social and family history reviewed: Past Medical History  Diagnosis Date  . COPD (chronic obstructive pulmonary disease) ([place])   . Allergy   . Diabetes mellitus   . Emphysema of lung ([place])   . GERD (gastroesophageal reflux disease)   . Hypertension   . Hyperlipidemia   . CAD (coronary artery disease)     a. s/p multiple PCIs to RCA and eventual CABG in 1999;  b.LHC 11/2003: CFX 95%, RCA occluded, SVG-RCA occluded, SVG-OM patent, native LAD patent. EF was 55%.  Patient had left to right collaterals and medical therapy was recommended    . Anxiety   . PVD (peripheral vascular disease) ([place])   . OA (osteoarthritis)   . Asthma   . Iron deficiency anemia   . Memory loss   . Insomnia   . DDD (degenerative disc disease), lumbar   . OSA (obstructive sleep apnea)   . Depression   . Benign prostatic hypertrophy   . Lumbar back pain     chronic  . Spinal stenosis   . Herniated disc   . History of colonic polyps   . Diverticulitis, colon   . Esophageal stricture   . Hx of colonoscopy   . Hiatal hernia   . HOH (hard of hearing)    Past Surgical History  Procedure Laterality Date  . Heart bypass    . Bilateral knee replacements    . Hemorrhoid surgery    . Carpal tunnel release    . Shoulder arthroscopy  right  . Coronary artery bypass graft  09/1997    double bypss  . Coronary angioplasty with stent placement  2/99  . Strabismus surgery  08/29/2011    Procedure: REPAIR STRABISMUS;  Surgeon: Derry Skill, MD;   Location: [facility];  Service: Ophthalmology;  Laterality: Left;   Social History  Substance Use Topics  . Smoking status: Current Every Day Smoker -- 1.00 packs/day for 40 years    Types: Cigarettes  . Smokeless tobacco: Never Used  . Alcohol Use: No   Family History  Problem Relation Age of Onset  . Stroke Father   . Emphysema Father   . Heart disease    . Hypertension    . Hyperlipidemia    . Kidney cancer Daughter   . Allergies    . Colon cancer Neg Hx   . Diabetes      Current Outpatient Prescriptions  Medication Sig Dispense Refill  . albuterol (VENTOLIN HFA) 108 (90 BASE) MCG/ACT inhaler Inhale 2 puffs into the lungs every 6 (six) hours as needed for wheezing or shortness of breath. 1 Inhaler 11  . ALPRAZolam (XANAX) 0.25 MG tablet Take 1 tablet (0.25 mg total) by mouth at bedtime as needed for anxiety or sleep (USE SPARINGLY - THIS MEDICINE CAN AFFECT YOUR BREATHING AND CAN CAUSE SEDATION AND WEAKNESS). TAKE 1 TABLET BY MOUTH THREE TIMES A DAY IF NEEDED 30 tablet 0  . AMBULATORY NON FORMULARY MEDICATION 2.5 Liters home oxygen continuous    .  Aspirin-Salicylamide-Caffeine (BC HEADACHE POWDER PO) Take 1 Package by mouth 4 (four) times daily as needed (headache or pain).    Marland Kitchen donepezil (ARICEPT) 10 MG tablet Take 1 tablet (10 mg total) by mouth at bedtime. 30 tablet 5  . DULoxetine (CYMBALTA) 30 MG capsule     . DULoxetine (CYMBALTA) 60 MG capsule take 1 capsule by mouth once daily 30 capsule 5  . fluticasone (FLONASE) 50 MCG/ACT nasal spray instill 2 sprays into each nostril once daily 16 g 11  . Fluticasone Furoate-Vilanterol (BREO ELLIPTA) 100-25 MCG/INH AEPB Inhale 1 puff into the lungs daily. 1 each 11  . furosemide (LASIX) 20 MG tablet Take 0.5 tablets (10 mg total) by mouth 2 (two) times daily. 20 tablet 0  . HYDROcodone-acetaminophen (NORCO) 10-325 MG tablet take 1 tablet by mouth every 4 to 6 hours if needed for pain (NO MORE THAN 5 PER DAY)  0  . lamoTRIgine (LAMICTAL) 25  MG tablet take 1 tablet at bedtime for 14 days then 1 tablet twice a day  0  . Multiple Vitamin (MULTIVITAMIN) capsule Take 1 capsule by mouth daily.     . nitroGLYCERIN (NITROSTAT) 0.4 MG SL tablet Place 1 tablet (0.4 mg total) under the tongue every 5 (five) minutes as needed. For chest pain 25 tablet 6  . omeprazole (PRILOSEC) 20 MG capsule take 1 capsule by mouth twice a day before meals 60 capsule 11  . pravastatin (PRAVACHOL) 80 MG tablet take 1 tablet by mouth once daily 90 tablet 3  . terbinafine (LAMISIL AT) 1 % cream Apply 1 application topically 2 (two) times daily. 30 g 0  . traMADol (ULTRAM) 50 MG tablet 1-2 tabs by mouth Q8 hours, maximum 6 tabs per day. 40 tablet 0  . umeclidinium bromide (INCRUSE ELLIPTA) 62.5 MCG/INH AEPB Inhale 1 puff into the lungs daily. 7 each 3  . zolpidem (AMBIEN) 10 MG tablet Take 10 mg by mouth at bedtime as needed. For sleep     No current facility-administered medications for this visit.   Allergies  Allergen Reactions  . Celecoxib Nausea And Vomiting    REACTION: GI upset  . Chicken Protein Other (See Comments)    Will not eat chicken  . Fish-Derived Products Other (See Comments)    Will not eat seafood of any kind  . Morphine Itching    REACTION: itching  . Nalbuphine Other (See Comments)    REACTION: contraindication with oxycontin.  . Prednisone Other (See Comments)    REACTION: "yeast infections"  . Venlafaxine Other (See Comments)    REACTION: GI upset      Review of Systems: CONSTITUTIONAL:  No  fever, no chills, (+) unintentional weight change CARDIAC: No  chest pain, No  pressure, No palpitations, No  orthopnea RESPIRATORY: (+) chronic cough, (+) chronic shortness of breath/wheeze NEUROLOGIC: (+) weakness, No  dizziness, No  slurred speech   Exam:  BP 142/89 mmHg  Pulse 98  Constitutional: VS see above. General Appearance: alert, well-developed, well-nourished, NAD Respiratory: Normal respiratory effort. (+) diffuse  bilateral wheeze, (+) faint bilateral rhonchi, no rales - consistent with previous exams Cardiovascular: S1/S2 normal, no murmur, no rub/gallop auscultated. RRR. No lower extremity edema. Gastrointestinal: Nontender, no masses.  Psychiatric: Normal judgment/insight. Normal mood and affect. Oriented x3.     Results for orders placed or performed in visit on 08/30/15 (from the past 72 hour(s))  BASIC METABOLIC PANEL WITH GFR     Status: Abnormal   Collection Time: 08/30/15  3:34 PM  Result Value Ref Range   Sodium 139 135 - 146 mmol/L   Potassium 6.0 (H) 3.5 - 5.3 mmol/L    Comment: No visible hemolysis.   Chloride 102 98 - 110 mmol/L   CO2 29 20 - 31 mmol/L   Glucose, Bld 118 (H) 65 - 99 mg/dL   BUN 37 (H) 7 - 25 mg/dL   Creat 1.39 (H) 0.70 - 1.18 mg/dL   Calcium 9.4 8.6 - 10.3 mg/dL   GFR, Est African American 58 (L) >=60 mL/min   GFR, Est Non African American 50 (L) >=60 mL/min    Comment:   The estimated GFR is a calculation valid for adults (>=28 years old) that uses the CKD-EPI algorithm to adjust for age and sex. It is   not to be used for children, pregnant women, hospitalized patients,    patients on dialysis, or with rapidly changing kidney function. According to the NKDEP, eGFR >89 is normal, 60-89 shows mild impairment, 30-59 shows moderate impairment, 15-29 shows severe impairment and <15 is ESRD.       EKG interpretation: Rate: 93 Rhythm: sinus No ST/T changes concerning for acute ischemia/infarct  No peak T waves   ASSESSMENT/PLAN: EKG today shows no significant hyperkalemic changes, we'll repeat labs stat, as well as urine electrolytes, magnesium. Start small dose Lasix, I don't think patient's blood pressure will tolerate high-dose of this, we'll hold off on Kayexalate for now given patient's other GI issues, he would probably want to opt for this rather then trip to the emergency room if potassium is still high, however he is advised that if it does come back  at a critical level I will advise that he go to emergency room. Consider aldosterone/renin function, we'll need to get this drawn another time in the morning, patient adamantly refuses referral to nephrology at this point. He is aware that my expertise may be limited, will do workup as noted already, if this problem persists despite appropriate management, I would have to insist on referral, at this point patient is certainly capable of making his own decisions and he understands the risks versus benefits of specialist consultation. This information was signed out to the on-call physician in case they get alerted about the stat lab results before I see them.    Hyperkalemia - Plan: Magnesium, Sodium, urine, random, Potassium, urine, random, COMPLETE METABOLIC PANEL WITH GFR, CANCELED: COMPLETE METABOLIC PANEL WITH GFR, CANCELED: Aldosterone + renin activity w/ ratio, CANCELED: Ambulatory referral to Nephrology  Chronic kidney disease, stage 3 (moderate) - Plan: CANCELED: Ambulatory referral to Nephrology   All questions were answered. Visit summary with updated medication list and pertinent instructions was printed for patient. ER/RTC precautions were reviewed with the patient. Return in about 5 days (around 09/05/2015) for likely will need to repeat labs, follow K.

## 2015-08-31 NOTE — Telephone Encounter (Signed)
Pt scheduled to see Amy Esterwood PA 09/11/15@10am . Please notify pt of appt.

## 2015-08-31 NOTE — Telephone Encounter (Signed)
5/25  Dr. Henrene Pastor patient - referral in [facility] for Early satiety, abnormal CT, and loss of weight.  Advise on scheduling.

## 2015-09-01 ENCOUNTER — Telehealth: Payer: Self-pay

## 2015-09-01 ENCOUNTER — Other Ambulatory Visit: Payer: Self-pay | Admitting: Osteopathic Medicine

## 2015-09-01 ENCOUNTER — Other Ambulatory Visit: Payer: Self-pay | Admitting: Sports Medicine

## 2015-09-01 DIAGNOSIS — N183 Chronic kidney disease, stage 3 unspecified: Secondary | ICD-10-CM

## 2015-09-01 DIAGNOSIS — N189 Chronic kidney disease, unspecified: Secondary | ICD-10-CM | POA: Diagnosis not present

## 2015-09-01 DIAGNOSIS — E875 Hyperkalemia: Secondary | ICD-10-CM

## 2015-09-01 LAB — COMPLETE METABOLIC PANEL WITH GFR
ALBUMIN: 3.7 g/dL (ref 3.6–5.1)
ALK PHOS: 85 U/L (ref 40–115)
ALT: 18 U/L (ref 9–46)
AST: 13 U/L (ref 10–35)
BILIRUBIN TOTAL: 0.2 mg/dL (ref 0.2–1.2)
BUN: 29 mg/dL — AB (ref 7–25)
CALCIUM: 9.1 mg/dL (ref 8.6–10.3)
CO2: 27 mmol/L (ref 20–31)
Chloride: 102 mmol/L (ref 98–110)
Creat: 1.47 mg/dL — ABNORMAL HIGH (ref 0.70–1.18)
GFR, Est African American: 54 mL/min — ABNORMAL LOW (ref >=60)
GFR, Est Non African American: 47 mL/min — ABNORMAL LOW (ref >=60)
GLUCOSE: 190 mg/dL — AB (ref 65–99)
POTASSIUM: 5.8 mmol/L — AB (ref 3.5–5.3)
Sodium: 140 mmol/L (ref 135–146)
TOTAL PROTEIN: 6.3 g/dL (ref 6.1–8.1)

## 2015-09-01 LAB — MAGNESIUM: Magnesium: 2.1 mg/dL (ref 1.5–2.5)

## 2015-09-01 MED ORDER — SODIUM POLYSTYRENE SULFONATE PO POWD: Freq: Once | ORAL | Status: DC

## 2015-09-01 NOTE — Addendum Note (Signed)
Addended by: Maryla Morrow on: 09/01/2015 09:39 AM   Modules accepted: Orders

## 2015-09-01 NOTE — Addendum Note (Signed)
Addended by: Maryla Morrow on: 09/01/2015 12:23 PM   Modules accepted: Orders

## 2015-09-02 LAB — SODIUM, URINE, RANDOM: SODIUM UR: 82 mmol/L (ref 28–272)

## 2015-09-02 LAB — POTASSIUM, URINE, RANDOM: Potassium Urine: 67 mmol/L (ref 12–129)

## 2015-09-05 DIAGNOSIS — N189 Chronic kidney disease, unspecified: Secondary | ICD-10-CM | POA: Diagnosis not present

## 2015-09-06 LAB — POTASSIUM: POTASSIUM: 5.1 mmol/L (ref 3.5–5.3)

## 2015-09-08 DIAGNOSIS — G8929 Other chronic pain: Secondary | ICD-10-CM | POA: Diagnosis not present

## 2015-09-08 DIAGNOSIS — M545 Low back pain: Secondary | ICD-10-CM | POA: Diagnosis not present

## 2015-09-08 DIAGNOSIS — Z79891 Long term (current) use of opiate analgesic: Secondary | ICD-10-CM | POA: Diagnosis not present

## 2015-09-08 DIAGNOSIS — M5442 Lumbago with sciatica, left side: Secondary | ICD-10-CM | POA: Diagnosis not present

## 2015-09-08 DIAGNOSIS — G894 Chronic pain syndrome: Secondary | ICD-10-CM | POA: Diagnosis not present

## 2015-09-09 DIAGNOSIS — Z79891 Long term (current) use of opiate analgesic: Secondary | ICD-10-CM | POA: Diagnosis not present

## 2015-09-11 ENCOUNTER — Ambulatory Visit (INDEPENDENT_AMBULATORY_CARE_PROVIDER_SITE_OTHER): Payer: Medicare Other | Admitting: Physician Assistant

## 2015-09-11 ENCOUNTER — Ambulatory Visit: Payer: Medicare Other | Admitting: Physician Assistant

## 2015-09-11 ENCOUNTER — Encounter: Payer: Self-pay | Admitting: Physician Assistant

## 2015-09-11 VITALS — BP 118/64 | HR 84 | Ht 66.0 in | Wt 151.0 lb

## 2015-09-11 DIAGNOSIS — R634 Abnormal weight loss: Secondary | ICD-10-CM

## 2015-09-11 DIAGNOSIS — R1084 Generalized abdominal pain: Secondary | ICD-10-CM

## 2015-09-11 DIAGNOSIS — R938 Abnormal findings on diagnostic imaging of other specified body structures: Secondary | ICD-10-CM | POA: Diagnosis not present

## 2015-09-11 DIAGNOSIS — R9389 Abnormal findings on diagnostic imaging of other specified body structures: Secondary | ICD-10-CM

## 2015-09-11 LAB — ALDOSTERONE + RENIN ACTIVITY W/ RATIO
ALDO / PRA Ratio: 5.1 Ratio (ref 0.9–28.9)
Aldosterone: 2 ng/dL
PRA LC/MS/MS: 0.39 ng/mL/h (ref 0.25–5.82)

## 2015-09-11 NOTE — Progress Notes (Signed)
Patient ID: [name]., male   DOB: 01/14/1941, 75 y.o.   MRN: [id number]   Subjective:    Patient ID: Tyler Napoleon Sr., male    DOB: 10-Mar-1941, 75 y.o.   MRN: [id number]  HPI  [name] is a pleasant 75 year old white male known to Dr. Henrene Pastor remotely from EGD and colonoscopy. He is referred today by [name] DO for evaluation of weight loss and abnormal CT of the abdomen. Patient had undergone screening colonoscopy in November 2008, this was a normal exam with the exception of left-sided diverticulosis which was mild. He last had EGD in 2012 with balloon dilation of a distal stricture. He has history of very artery disease is status post CABG and several stents all done remotely, adult-onset diabetes mellitus, chronic kidney disease stage III, hypertension, and severe COPD for which she is on home O2 2.5 L at night. Patient has had a recent weight loss of about 15 pounds over the past 4-6 weeks. He says his appetite is okay and he is not aware that he is eating significantly less than usual. Some episodes of acid reflux. He complains of mild mid abdominal discomfort "like a toothache"'s been present for about a year. This bowel movements have been fairly normal some mild constipation he has not noted any melena or hematochezia. Nice any dysphagia or odynophagia. After being seen by his PCP he had labs done on 09/01/2015 BO and 29 creatinine 1.45 LFTs were normal, hemoglobin 13 hematocrit of 39. He had CT of the chest abdomen and pelvis done on 5/23  Shows a large hiatal hernia, 2.6 cm right kidney cyst and there is an abrupt transition in the sigmoid colon with a narrowed area extending over about 6 cm without definite mass being seen. Cannot rule out colon cancer. CT of the chest shows emphysema no pathologically enlarged nodes no significant axillary adenopathy artery calcifications and stents are noted and a stable for a 5 mm nodule in the right upper lobe  Review of  Systems;Pertinent positive and negative review of systems were noted in the above HPI section.  All other review of systems was otherwise negative.  Outpatient Encounter Prescriptions as of 09/11/2015  Medication Sig  . albuterol (VENTOLIN HFA) 108 (90 BASE) MCG/ACT inhaler Inhale 2 puffs into the lungs every 6 (six) hours as needed for wheezing or shortness of breath.  . ALPRAZolam (XANAX) 0.25 MG tablet Take 1 tablet (0.25 mg total) by mouth at bedtime as needed for anxiety or sleep (USE SPARINGLY - THIS MEDICINE CAN AFFECT YOUR BREATHING AND CAN CAUSE SEDATION AND WEAKNESS). TAKE 1 TABLET BY MOUTH THREE TIMES A DAY IF NEEDED  . AMBULATORY NON FORMULARY MEDICATION 2.5 Liters home oxygen continuous  . Aspirin-Salicylamide-Caffeine (BC HEADACHE POWDER PO) Take 1 Package by mouth 4 (four) times daily as needed (headache or pain).  Marland Kitchen donepezil (ARICEPT) 10 MG tablet Take 1 tablet (10 mg total) by mouth at bedtime.  . DULoxetine (CYMBALTA) 30 MG capsule   . DULoxetine (CYMBALTA) 60 MG capsule take 1 capsule by mouth once daily  . fluticasone (FLONASE) 50 MCG/ACT nasal spray instill 2 sprays into each nostril once daily  . Fluticasone Furoate-Vilanterol (BREO ELLIPTA) 100-25 MCG/INH AEPB Inhale 1 puff into the lungs daily.  . furosemide (LASIX) 20 MG tablet Take 0.5 tablets (10 mg total) by mouth 2 (two) times daily.  Marland Kitchen HYDROcodone-acetaminophen (NORCO) 10-325 MG tablet take 1 tablet by mouth every 4 to 6 hours if needed for pain (  NO MORE THAN 5 PER DAY)  . lamoTRIgine (LAMICTAL) 25 MG tablet take 1 tablet at bedtime for 14 days then 1 tablet twice a day  . Multiple Vitamin (MULTIVITAMIN) capsule Take 1 capsule by mouth daily.   . nitroGLYCERIN (NITROSTAT) 0.4 MG SL tablet Place 1 tablet (0.4 mg total) under the tongue every 5 (five) minutes as needed. For chest pain  . omeprazole (PRILOSEC) 20 MG capsule take 1 capsule by mouth twice a day before meals  . pravastatin (PRAVACHOL) 80 MG tablet take 1  tablet by mouth once daily  . terbinafine (LAMISIL AT) 1 % cream Apply 1 application topically 2 (two) times daily.  Marland Kitchen umeclidinium bromide (INCRUSE ELLIPTA) 62.5 MCG/INH AEPB Inhale 1 puff into the lungs daily.  Marland Kitchen zolpidem (AMBIEN) 10 MG tablet Take 10 mg by mouth at bedtime as needed. For sleep  . [DISCONTINUED] sodium polystyrene (KAYEXALATE) powder Take by mouth once. 15g PO x1  . [DISCONTINUED] traMADol (ULTRAM) 50 MG tablet 1-2 tabs by mouth Q8 hours, maximum 6 tabs per day.   No facility-administered encounter medications on file as of 09/11/2015.   Allergies  Allergen Reactions  . Celecoxib Nausea And Vomiting    REACTION: GI upset  . Chicken Protein Other (See Comments)    Will not eat chicken  . Fish-Derived Products Other (See Comments)    Will not eat seafood of any kind  . Morphine Itching    REACTION: itching  . Nalbuphine Other (See Comments)    REACTION: contraindication with oxycontin.  . Prednisone Other (See Comments)    REACTION: "yeast infections"  . Venlafaxine Other (See Comments)    REACTION: GI upset   Patient Active Problem List   Diagnosis Date Noted  . Chronic kidney disease 08/30/2015  . Moderate protein-calorie malnutrition ([place]) 08/24/2015  . COPD with acute exacerbation ([place]) 08/24/2015  . Other secondary osteoarthritis of both shoulders 06/12/2015  . Rhinitis, allergic 06/12/2015  . COPD, severe ([place]) 05/10/2015  . Bradycardia 04/11/2015  . Insomnia 04/05/2015  . Risk for falls 04/05/2015  . Type 2 diabetes mellitus with circulatory disorder ([place]) 04/05/2015  . Onychomycosis 11/25/2014  . Tinea pedis 11/25/2014  . COPD exacerbation ([place]) 05/13/2014  . Acute low back pain 08/18/2013  . Chest pain, atypical 10/17/2012  . Chronic respiratory failure with hypoxia ([place]) 10/17/2012  . On home oxygen therapy 10/17/2012  . Hyperkalemia 10/17/2012  . Headache(784.0) 02/20/2012  . Weight loss 04/17/2011  . Bladder neck obstruction 01/17/2011  .  Preventative health care 11/17/2010  . CONSTIPATION 06/12/2010  . FLATULENCE-GAS-BLOATING 06/12/2010  . SCIATICA, LEFT 05/31/2010  . PHIMOSIS 04/06/2009  . TOBACCO USER 01/17/2009  . DYSPHAGIA 07/25/2008  . Osteoarthritis 06/24/2008  . ANEMIA-IRON DEFICIENCY 05/20/2008  . Memory loss 05/18/2008  . DEGENERATIVE DISC DISEASE, LUMBAR SPINE 01/03/2008  . OBSTRUCTIVE SLEEP APNEA 07/03/2007  . Anxiety state 06/24/2007  . DEPRESSION 06/24/2007  . BENIGN PROSTATIC HYPERTROPHY 06/24/2007  . HYPERSOMNIA 06/24/2007  . FATIGUE 06/24/2007  . Urinary hesitancy 06/24/2007  . COLONIC POLYPS 06/12/2007  . Diabetes ([place]) 06/12/2007  . HYPERLIPIDEMIA 06/12/2007  . Essential hypertension 06/12/2007  . ANGINA PECTORIS 06/12/2007  . Coronary atherosclerosis 06/12/2007  . PERIPHERAL VASCULAR DISEASE 06/12/2007  . ESOPHAGEAL STRICTURE 06/12/2007  . GERD 06/12/2007  . DIVERTICULOSIS, COLON 06/12/2007  . HERNIATED DISC 06/12/2007  . SPINAL STENOSIS 06/12/2007  . Lumbago 06/12/2007   Social History   Social History  . Marital Status: Married    Spouse Name: N/A  .  Number of Children: 5  . Years of Education: N/A   Occupational History  . Retired-construction    Social History Main Topics  . Smoking status: Current Every Day Smoker -- 1.00 packs/day for 40 years    Types: Cigarettes  . Smokeless tobacco: Never Used  . Alcohol Use: No  . Drug Use: No  . Sexual Activity: Not on file   Other Topics Concern  . Not on file   Social History Narrative    Mr. Trantham's family history includes Emphysema in his father; Kidney cancer in his daughter; Stroke in his father. There is no history of Colon cancer.      Objective:    Filed Vitals:   09/11/15 1339  BP: 118/64  Pulse: 84    Physical Exam  well-developed older white male in no acute distress, ambulates with a cane accompanied by his wife blood pressure 118/64 pulse 84 height 5 foot 6 weight 143 BMI of 23. O2 sat 92% on room air.  HEENT ;nontraumatic normocephalic EOMI PERRLA, Cardiovascular; regular rate and rhythm with S1-S2 no murmur rub or gallop sternal incisional scar, Pulmonary; scattered rhonchi and faint wheezes laterally, Abdomen; soft basically nontender there is no palpable mass or hepatosplenomegaly bowel sounds are present, Rectal ;exam not done, Extremities; no clubbing cyanosis or edema skin warm and dry, Neuropsych ;mood and affect appropriate he is somewhat hard of hearing     Assessment & Plan:   #59  75 year old white male with recent weight loss and mid abdominal discomfort in setting of known severe COPD. CT of the abdomen concerning for sigmoid colon cancer versus stricture #2 O2 dependent COPD #3 chronic kidney disease stage III #4.onset diabetes mellitus #5 continued smoking #6 coronary artery disease status post CABG and stents all remote #7 GERD with history of peptic stricture  Plan; long discussion with the patient and his wife. He needs to have colonoscopy but is at high risk for pulmonary complications given O2 dependent COPD. Procedure, risks and benefits were discussed with the patient in detail and he is agreeable to proceed. Procedure will need to be done at the hospital and is scheduled for Friday, July 7 with MAC at [facility], with Dr. Henrene Pastor.   Amy S Esterwood PA-C 09/11/2015   Cc: [name], DO

## 2015-09-11 NOTE — Patient Instructions (Signed)
You have been scheduled for a colonoscopy. Please follow written instructions given to you at your visit today.  We have given you a Moviprep sample for the colonoscopy. If you use inhalers (even only as needed), please bring them with you on the day of your procedure. Your physician has requested that you go to www.startemmi.com and enter the access code given to you at your visit today. This web site gives a general overview about your procedure. However, you should still follow specific instructions given to you by our office regarding your preparation for the procedure.

## 2015-09-12 ENCOUNTER — Telehealth: Payer: Self-pay | Admitting: *Deleted

## 2015-09-12 NOTE — Progress Notes (Signed)
Reviewed. Agree with plans for colonoscopy to rule out neoplastic mass versus severe diverticular changes in the sigmoid colon. High-risk patient to be done at [facility]

## 2015-09-12 NOTE — Telephone Encounter (Signed)
Called the patient's wife, Leda Gauze to advise we had to change the location of the colonoscopy for her husband. Advised it is still on 10-13-2015 but it will be at [facility], [employer], Endoscopy Suite.  The time will be 9:00am He will arrive about 7:30 am.  Told her the hospital will call to verify the time.  Dr. Henrene Pastor will still be performing the procedure.  She thanked me for calling to advise.

## 2015-09-14 ENCOUNTER — Other Ambulatory Visit: Payer: Self-pay

## 2015-09-14 DIAGNOSIS — N183 Chronic kidney disease, stage 3 unspecified: Secondary | ICD-10-CM

## 2015-09-14 DIAGNOSIS — E875 Hyperkalemia: Secondary | ICD-10-CM

## 2015-09-14 LAB — POTASSIUM: POTASSIUM: 5.4 mmol/L — AB (ref 3.5–5.3)

## 2015-09-15 ENCOUNTER — Telehealth: Payer: Self-pay

## 2015-09-15 LAB — SODIUM, URINE, RANDOM: SODIUM UR: 32 mmol/L (ref 28–272)

## 2015-09-15 LAB — MAGNESIUM: Magnesium: 1.9 mg/dL (ref 1.5–2.5)

## 2015-09-15 LAB — POTASSIUM, URINE, RANDOM: POTASSIUM UR: 58 mmol/L (ref 12–129)

## 2015-09-15 LAB — CREATININE, URINE, RANDOM: Creatinine, Urine: 102 mg/dL (ref 20–370)

## 2015-09-15 MED ORDER — FUROSEMIDE 20 MG PO TABS: 10.0000 mg | ORAL_TABLET | Freq: Two times a day (BID) | ORAL | Status: DC

## 2015-09-15 NOTE — Telephone Encounter (Signed)
Patient request refill for Lasix 20 mg. #30 0 refills was sent to [facility]. Rhonda Cunningham,CMA

## 2015-09-21 ENCOUNTER — Telehealth: Payer: Self-pay | Admitting: Cardiovascular Disease

## 2015-09-21 NOTE — Telephone Encounter (Signed)
Request for surgical clearance:  What type of surgery is being performed? Shoulder Replacement   1. When is this surgery scheduled? Pending   2. Are there any medications that need to be held prior to surgery and how long?Aspirin   3. Name of physician performing surgery? Dr.Chandler   4. What is your office phone and fax number? [phone]

## 2015-09-21 NOTE — Telephone Encounter (Signed)
Pt will need office visit prior to being cleared. I left message on Carla's voicemail with this information. I spoke with pt's wife. Pt is scheduled for colonoscopy on 7/7. They would like to wait until after this to see Dr. Angelena Form. Appt made for pt to see Dr. Angelena Form on 10/30/15 at 3:00.

## 2015-09-29 ENCOUNTER — Encounter: Payer: Self-pay | Admitting: Cardiovascular Disease

## 2015-09-29 ENCOUNTER — Telehealth: Payer: Self-pay

## 2015-09-29 DIAGNOSIS — G894 Chronic pain syndrome: Secondary | ICD-10-CM

## 2015-09-29 NOTE — Telephone Encounter (Signed)
Spoke to patient wife and she stated that the referral needs to be with Dr. Renie Ora  [facility] in [place]. Channie Bostick,CMA

## 2015-09-29 NOTE — Telephone Encounter (Signed)
Needs to check with his insurance and see what other providers are covered - if he gives [place] the name of someone else he'd like to see, I'll be happy to place a referral.

## 2015-09-29 NOTE — Telephone Encounter (Signed)
Patient called stated that he is seeing Dr. Lennox Grumbles for Pain Management and he would like another referral to see [facility] because every time he go there it be like a 4 hour wait. Please advise. Zailyn Rowser,CMA

## 2015-10-02 NOTE — Telephone Encounter (Signed)
Ok, referral placed

## 2015-10-05 DIAGNOSIS — M5442 Lumbago with sciatica, left side: Secondary | ICD-10-CM | POA: Diagnosis not present

## 2015-10-05 DIAGNOSIS — G890 Central pain syndrome: Secondary | ICD-10-CM | POA: Diagnosis not present

## 2015-10-05 DIAGNOSIS — Z79891 Long term (current) use of opiate analgesic: Secondary | ICD-10-CM | POA: Diagnosis not present

## 2015-10-05 DIAGNOSIS — G894 Chronic pain syndrome: Secondary | ICD-10-CM | POA: Diagnosis not present

## 2015-10-08 ENCOUNTER — Other Ambulatory Visit: Payer: Self-pay | Admitting: Osteopathic Medicine

## 2015-10-09 ENCOUNTER — Other Ambulatory Visit: Payer: Self-pay | Admitting: *Deleted

## 2015-10-09 ENCOUNTER — Encounter: Payer: Self-pay | Admitting: Sports Medicine

## 2015-10-09 ENCOUNTER — Ambulatory Visit (INDEPENDENT_AMBULATORY_CARE_PROVIDER_SITE_OTHER): Payer: Medicare Other | Admitting: Sports Medicine

## 2015-10-09 VITALS — BP 116/75 | HR 84 | Resp 18 | Wt 150.7 lb

## 2015-10-09 DIAGNOSIS — M19211 Secondary osteoarthritis, right shoulder: Secondary | ICD-10-CM

## 2015-10-09 DIAGNOSIS — M19212 Secondary osteoarthritis, left shoulder: Secondary | ICD-10-CM | POA: Diagnosis not present

## 2015-10-09 MED ORDER — DULOXETINE HCL 60 MG PO CPEP: 60.0000 mg | ORAL_CAPSULE | Freq: Every day | ORAL | Status: DC

## 2015-10-09 NOTE — Progress Notes (Signed)
  Subjective:    CC: Right shoulder pain  HPI: Primary osteoarthritis: Previous injection was approximately 2 months ago, desires repeat injection, currently going through the surgical clearance process with his cardiologist, total shoulder arthroplasty is planned. He is still smoking.  Past medical history, Surgical history, Family history not pertinant except as noted below, Social history, Allergies, and medications have been entered into the medical record, reviewed, and no changes needed.   Review of Systems: No fevers, chills, night sweats, weight loss, chest pain, or shortness of breath.   Objective:    General: Well Developed, well nourished, and in no acute distress.  Neuro: Alert and oriented x3, extra-ocular muscles intact, sensation grossly intact.  HEENT: Normocephalic, atraumatic, pupils equal round reactive to light, neck supple, no masses, no lymphadenopathy, thyroid nonpalpable.  Skin: Warm and dry, no rashes. Cardiac: Regular rate and rhythm, no murmurs rubs or gallops, no lower extremity edema.  Respiratory: Clear to auscultation bilaterally. Not using accessory muscles, speaking in full sentences.  Procedure: Real-time Ultrasound Guided Injection of right glenohumeral joint Device: GE Logiq E  Verbal informed consent obtained.  Time-out conducted.  Noted no overlying erythema, induration, or other signs of local infection.  Skin prepped in a sterile fashion.  Local anesthesia: Topical Ethyl chloride.  With sterile technique and under real time ultrasound guidance:  Spinal needle advanced into the joint, 1 mL kenalog 40, 2 mL lidocaine, 2 mL Marcaine injected easily. Completed without difficulty  Pain immediately resolved suggesting accurate placement of the medication.  Advised to call if fevers/chills, erythema, induration, drainage, or persistent bleeding.  Images permanently stored and available for review in the ultrasound unit.  Impression: Technically  successful ultrasound guided injection.  Impression and Recommendations:

## 2015-10-09 NOTE — Assessment & Plan Note (Addendum)
He is a bit early but we are going to proceed with a repeat right glenohumeral injection, he understands he cannot have anymore for 4 months and needs to proceed with surgery. Looks like Dr. Bettina Gavia planning a total shoulder arthroplasty, I emphasized the importance of smoking cessation before the surgery, or it will never heal. He agrees to discuss this further with Dr. Sheppard Coil. Currently going through cardiac clearance.

## 2015-10-09 NOTE — Telephone Encounter (Signed)
Patient request refill for Xanax 0.25mg . Suanne Marker Coretta Leisey,CMA

## 2015-10-11 ENCOUNTER — Telehealth: Payer: Self-pay | Admitting: Physician Assistant

## 2015-10-11 ENCOUNTER — Encounter (HOSPITAL_COMMUNITY): Payer: Self-pay | Admitting: *Deleted

## 2015-10-11 NOTE — Progress Notes (Signed)
Spoke with pt's wife for pre-op call. Pt has long history of CAD with CABG in 1995. She denies any c/o chest pain from pt. States he's always short of breath, is on O2 at 2.5 L continuously. Last office visit with Dr. Angelena Form was 11/09/14 and was told to come back in a year. Pt is diabetic, but she states she doesn't usually check his fasting blood sugar.  Cath - 2005 EKG - 08/31/15

## 2015-10-11 NOTE — Telephone Encounter (Signed)
LM for Leda Gauze, the patient's wife advising they should be getting a call from [facility] regarding his procedure on Friday 10-13-2015 with Dr. Henrene Pastor.

## 2015-10-12 ENCOUNTER — Telehealth: Payer: Self-pay

## 2015-10-12 MED ORDER — ALPRAZOLAM 0.25 MG PO TABS: 0.2500 mg | ORAL_TABLET | Freq: Every evening | ORAL | Status: DC | PRN

## 2015-10-12 NOTE — Telephone Encounter (Signed)
RX HAS BEEN FAXED TO PHARMACY. Rhonda Cunningham,CMA

## 2015-10-12 NOTE — Telephone Encounter (Signed)
Patient request refill for Xanax 0.25 mg. Please advise. Rhonda Cunningham,CMA

## 2015-10-12 NOTE — Telephone Encounter (Signed)
I printed prescription for a limited number of these pills, I had previously advised the patient that he should wean off of this medicine, I believe I spoke to his wife about the same issue. Please confirm that they're coming in to discuss alternatives to these medicines

## 2015-10-13 ENCOUNTER — Ambulatory Visit (HOSPITAL_COMMUNITY): Payer: Medicare Other | Admitting: Certified Registered Nurse Anesthetist

## 2015-10-13 ENCOUNTER — Encounter (HOSPITAL_COMMUNITY): Payer: Self-pay | Admitting: *Deleted

## 2015-10-13 ENCOUNTER — Encounter (HOSPITAL_COMMUNITY)
Admission: RE | Disposition: A | Discharge status: Home / Self Care | Payer: Self-pay | Source: Ambulatory Visit | Attending: Internal Medicine

## 2015-10-13 ENCOUNTER — Ambulatory Visit (HOSPITAL_COMMUNITY)
Admission: RE | Admit: 2015-10-13 | Discharge: 2015-10-13 | Disposition: A | Discharge status: Home / Self Care | Payer: Medicare Other | Source: Ambulatory Visit | Attending: Internal Medicine | Admitting: Internal Medicine

## 2015-10-13 DIAGNOSIS — R1084 Generalized abdominal pain: Secondary | ICD-10-CM | POA: Diagnosis not present

## 2015-10-13 DIAGNOSIS — Z9981 Dependence on supplemental oxygen: Secondary | ICD-10-CM | POA: Insufficient documentation

## 2015-10-13 DIAGNOSIS — E785 Hyperlipidemia, unspecified: Secondary | ICD-10-CM | POA: Insufficient documentation

## 2015-10-13 DIAGNOSIS — I129 Hypertensive chronic kidney disease with stage 1 through stage 4 chronic kidney disease, or unspecified chronic kidney disease: Secondary | ICD-10-CM | POA: Diagnosis not present

## 2015-10-13 DIAGNOSIS — Z6823 Body mass index (BMI) 23.0-23.9, adult: Secondary | ICD-10-CM | POA: Diagnosis not present

## 2015-10-13 DIAGNOSIS — R933 Abnormal findings on diagnostic imaging of other parts of digestive tract: Secondary | ICD-10-CM | POA: Diagnosis not present

## 2015-10-13 DIAGNOSIS — K5730 Diverticulosis of large intestine without perforation or abscess without bleeding: Secondary | ICD-10-CM | POA: Diagnosis not present

## 2015-10-13 DIAGNOSIS — Z955 Presence of coronary angioplasty implant and graft: Secondary | ICD-10-CM | POA: Diagnosis not present

## 2015-10-13 DIAGNOSIS — Z951 Presence of aortocoronary bypass graft: Secondary | ICD-10-CM | POA: Insufficient documentation

## 2015-10-13 DIAGNOSIS — R634 Abnormal weight loss: Secondary | ICD-10-CM | POA: Diagnosis not present

## 2015-10-13 DIAGNOSIS — F17210 Nicotine dependence, cigarettes, uncomplicated: Secondary | ICD-10-CM | POA: Insufficient documentation

## 2015-10-13 DIAGNOSIS — I2510 Atherosclerotic heart disease of native coronary artery without angina pectoris: Secondary | ICD-10-CM | POA: Insufficient documentation

## 2015-10-13 DIAGNOSIS — R938 Abnormal findings on diagnostic imaging of other specified body structures: Secondary | ICD-10-CM | POA: Diagnosis not present

## 2015-10-13 DIAGNOSIS — F329 Major depressive disorder, single episode, unspecified: Secondary | ICD-10-CM | POA: Diagnosis not present

## 2015-10-13 DIAGNOSIS — K219 Gastro-esophageal reflux disease without esophagitis: Secondary | ICD-10-CM | POA: Insufficient documentation

## 2015-10-13 DIAGNOSIS — F411 Generalized anxiety disorder: Secondary | ICD-10-CM | POA: Insufficient documentation

## 2015-10-13 DIAGNOSIS — E440 Moderate protein-calorie malnutrition: Secondary | ICD-10-CM | POA: Diagnosis not present

## 2015-10-13 DIAGNOSIS — N183 Chronic kidney disease, stage 3 (moderate): Secondary | ICD-10-CM | POA: Insufficient documentation

## 2015-10-13 DIAGNOSIS — E1122 Type 2 diabetes mellitus with diabetic chronic kidney disease: Secondary | ICD-10-CM | POA: Insufficient documentation

## 2015-10-13 DIAGNOSIS — R9389 Abnormal findings on diagnostic imaging of other specified body structures: Secondary | ICD-10-CM

## 2015-10-13 DIAGNOSIS — Z79899 Other long term (current) drug therapy: Secondary | ICD-10-CM | POA: Diagnosis not present

## 2015-10-13 DIAGNOSIS — K5790 Diverticulosis of intestine, part unspecified, without perforation or abscess without bleeding: Secondary | ICD-10-CM | POA: Diagnosis not present

## 2015-10-13 DIAGNOSIS — J309 Allergic rhinitis, unspecified: Secondary | ICD-10-CM | POA: Diagnosis not present

## 2015-10-13 DIAGNOSIS — J449 Chronic obstructive pulmonary disease, unspecified: Secondary | ICD-10-CM | POA: Insufficient documentation

## 2015-10-13 DIAGNOSIS — Z7951 Long term (current) use of inhaled steroids: Secondary | ICD-10-CM | POA: Insufficient documentation

## 2015-10-13 HISTORY — DX: Pneumonia, unspecified organism: J18.9

## 2015-10-13 HISTORY — PX: COLONOSCOPY: SHX5424

## 2015-10-13 HISTORY — DX: Reserved for inherently not codable concepts without codable children: IMO0001

## 2015-10-13 LAB — POCT I-STAT 4, (NA,K, GLUC, HGB,HCT)
Glucose, Bld: 114 mg/dL — ABNORMAL HIGH (ref 65–99)
HCT: 34 % — ABNORMAL LOW (ref 39.0–52.0)
HEMOGLOBIN: 11.6 g/dL — AB (ref 13.0–17.0)
POTASSIUM: 3.8 mmol/L (ref 3.5–5.1)
Sodium: 143 mmol/L (ref 135–145)

## 2015-10-13 SURGERY — COLONOSCOPY
Anesthesia: Monitor Anesthesia Care

## 2015-10-13 MED ORDER — GLYCOPYRROLATE 0.2 MG/ML IJ SOLN
INTRAMUSCULAR | Status: DC | PRN
  Administered 2015-10-13: 0.2 mg via INTRAVENOUS

## 2015-10-13 MED ORDER — LACTATED RINGERS IV SOLN
INTRAVENOUS | Status: DC
  Administered 2015-10-13: 09:00:00 via INTRAVENOUS
  Administered 2015-10-13: 1000 mL via INTRAVENOUS

## 2015-10-13 MED ORDER — SODIUM CHLORIDE 0.9 % IV SOLN: INTRAVENOUS | Status: DC

## 2015-10-13 MED ORDER — PROPOFOL 500 MG/50ML IV EMUL
INTRAVENOUS | Status: DC | PRN
  Administered 2015-10-13: 50 ug/kg/min via INTRAVENOUS

## 2015-10-13 NOTE — Anesthesia Preprocedure Evaluation (Addendum)
Anesthesia Evaluation  Patient identified by MRN, date of birth, ID band Patient awake    Reviewed: Allergy & Precautions, H&P , NPO status , Patient's Chart, lab work & pertinent test results  Airway Mallampati: I  TM Distance: >3 FB Neck ROM: Full    Dental   Pulmonary shortness of breath, asthma , sleep apnea , pneumonia, COPD, Current Smoker,    Pulmonary exam normal        Cardiovascular hypertension, + angina + CAD, + Past MI and + Peripheral Vascular Disease  Normal cardiovascular exam Rhythm:Regular     Neuro/Psych  Headaches, Anxiety Depression  Neuromuscular disease    GI/Hepatic hiatal hernia, GERD  ,  Endo/Other  diabetes  Renal/GU Renal disease     Musculoskeletal  (+) Arthritis ,   Abdominal   Peds  Hematology  (+) anemia ,   Anesthesia Other Findings   Reproductive/Obstetrics                            Anesthesia Physical  Anesthesia Plan  ASA: III  Anesthesia Plan: MAC   Post-op Pain Management:    Induction: Intravenous  Airway Management Planned:   Additional Equipment:   Intra-op Plan:   Post-operative Plan:   Informed Consent: I have reviewed the patients History and Physical, chart, labs and discussed the procedure including the risks, benefits and alternatives for the proposed anesthesia with the patient or authorized representative who has indicated his/her understanding and acceptance.   Dental advisory given  Plan Discussed with: CRNA  Anesthesia Plan Comments:        Anesthesia Quick Evaluation

## 2015-10-13 NOTE — Anesthesia Postprocedure Evaluation (Signed)
Anesthesia Post Note  Patient: Tyler MARDIROSSIAN Sr.  Procedure(s) Performed: Procedure(s) (LRB): COLONOSCOPY (N/A)  Patient location during evaluation: Endoscopy Anesthesia Type: MAC Level of consciousness: awake and alert Pain management: satisfactory to patient Vital Signs Assessment: post-procedure vital signs reviewed and stable Respiratory status: spontaneous breathing Cardiovascular status: blood pressure returned to baseline Postop Assessment: no headache Anesthetic complications: no    Last Vitals:  Filed Vitals:   10/13/15 0805 10/13/15 0942  BP: 125/70   Pulse:  91  Temp: 36.5 C   Resp: 16 17    Last Pain: There were no vitals filed for this visit.               Judeth Cornfield T

## 2015-10-13 NOTE — H&P (Signed)
Patient ID: [name]., male DOB: 1941/02/11, 75 y.o. MRN: [id number]  HPI [name] is a pleasant 75 year old white male known to Dr. Henrene Pastor remotely from EGD and colonoscopy. He is referred today by [name] DO for evaluation of weight loss and abnormal CT of the abdomen. Patient had undergone screening colonoscopy in November 2008, this was a normal exam with the exception of left-sided diverticulosis which was mild. He last had EGD in 2012 with balloon dilation of a distal stricture. He has history of very artery disease is status post CABG and several stents all done remotely, adult-onset diabetes mellitus, chronic kidney disease stage III, hypertension, and severe COPD for which she is on home O2 2.5 L at night. Patient has had a recent weight loss of about 15 pounds over the past 4-6 weeks. He says his appetite is okay and he is not aware that he is eating significantly less than usual. Some episodes of acid reflux. He complains of mild mid abdominal discomfort "like a toothache"'s been present for about a year. This bowel movements have been fairly normal some mild constipation he has not noted any melena or hematochezia. Nice any dysphagia or odynophagia. After being seen by his PCP he had labs done on 09/01/2015 BO and 29 creatinine 1.45 LFTs were normal, hemoglobin 13 hematocrit of 39. He had CT of the chest abdomen and pelvis done on 5/23 Shows a large hiatal hernia, 2.6 cm right kidney cyst and there is an abrupt transition in the sigmoid colon with a narrowed area extending over about 6 cm without definite mass being seen. Cannot rule out colon cancer. CT of the chest shows emphysema no pathologically enlarged nodes no significant axillary adenopathy artery calcifications and stents are noted and a stable for a 5 mm nodule in the right upper lobe  Review of Systems;Pertinent positive and negative review of systems were noted in the above HPI section. All other review of  systems was otherwise negative.  Outpatient Encounter Prescriptions as of 09/11/2015  Medication Sig  . albuterol (VENTOLIN HFA) 108 (90 BASE) MCG/ACT inhaler Inhale 2 puffs into the lungs every 6 (six) hours as needed for wheezing or shortness of breath.  . ALPRAZolam (XANAX) 0.25 MG tablet Take 1 tablet (0.25 mg total) by mouth at bedtime as needed for anxiety or sleep (USE SPARINGLY - THIS MEDICINE CAN AFFECT YOUR BREATHING AND CAN CAUSE SEDATION AND WEAKNESS). TAKE 1 TABLET BY MOUTH THREE TIMES A DAY IF NEEDED  . AMBULATORY NON FORMULARY MEDICATION 2.5 Liters home oxygen continuous  . Aspirin-Salicylamide-Caffeine (BC HEADACHE POWDER PO) Take 1 Package by mouth 4 (four) times daily as needed (headache or pain).  Marland Kitchen donepezil (ARICEPT) 10 MG tablet Take 1 tablet (10 mg total) by mouth at bedtime.  . DULoxetine (CYMBALTA) 30 MG capsule   . DULoxetine (CYMBALTA) 60 MG capsule take 1 capsule by mouth once daily  . fluticasone (FLONASE) 50 MCG/ACT nasal spray instill 2 sprays into each nostril once daily  . Fluticasone Furoate-Vilanterol (BREO ELLIPTA) 100-25 MCG/INH AEPB Inhale 1 puff into the lungs daily.  . furosemide (LASIX) 20 MG tablet Take 0.5 tablets (10 mg total) by mouth 2 (two) times daily.  Marland Kitchen HYDROcodone-acetaminophen (NORCO) 10-325 MG tablet take 1 tablet by mouth every 4 to 6 hours if needed for pain (NO MORE THAN 5 PER DAY)  . lamoTRIgine (LAMICTAL) 25 MG tablet take 1 tablet at bedtime for 14 days then 1 tablet twice a day  . Multiple Vitamin (  MULTIVITAMIN) capsule Take 1 capsule by mouth daily.   . nitroGLYCERIN (NITROSTAT) 0.4 MG SL tablet Place 1 tablet (0.4 mg total) under the tongue every 5 (five) minutes as needed. For chest pain  . omeprazole (PRILOSEC) 20 MG capsule take 1 capsule by mouth twice a day before meals  . pravastatin (PRAVACHOL) 80 MG tablet take 1 tablet by mouth once daily  . terbinafine (LAMISIL AT) 1 %  cream Apply 1 application topically 2 (two) times daily.  Marland Kitchen umeclidinium bromide (INCRUSE ELLIPTA) 62.5 MCG/INH AEPB Inhale 1 puff into the lungs daily.  Marland Kitchen zolpidem (AMBIEN) 10 MG tablet Take 10 mg by mouth at bedtime as needed. For sleep  . [DISCONTINUED] sodium polystyrene (KAYEXALATE) powder Take by mouth once. 15g PO x1  . [DISCONTINUED] traMADol (ULTRAM) 50 MG tablet 1-2 tabs by mouth Q8 hours, maximum 6 tabs per day.   No facility-administered encounter medications on file as of 09/11/2015.   Allergies  Allergen Reactions  . Celecoxib Nausea And Vomiting    REACTION: GI upset  . Chicken Protein Other (See Comments)    Will not eat chicken  . Fish-Derived Products Other (See Comments)    Will not eat seafood of any kind  . Morphine Itching    REACTION: itching  . Nalbuphine Other (See Comments)    REACTION: contraindication with oxycontin.  . Prednisone Other (See Comments)    REACTION: "yeast infections"  . Venlafaxine Other (See Comments)    REACTION: GI upset   Patient Active Problem List   Diagnosis Date Noted  . Chronic kidney disease 08/30/2015  . Moderate protein-calorie malnutrition ([place]) 08/24/2015  . COPD with acute exacerbation ([place]) 08/24/2015  . Other secondary osteoarthritis of both shoulders 06/12/2015  . Rhinitis, allergic 06/12/2015  . COPD, severe ([place]) 05/10/2015  . Bradycardia 04/11/2015  . Insomnia 04/05/2015  . Risk for falls 04/05/2015  . Type 2 diabetes mellitus with circulatory disorder ([place]) 04/05/2015  . Onychomycosis 11/25/2014  . Tinea pedis 11/25/2014  . COPD exacerbation ([place]) 05/13/2014  . Acute low back pain 08/18/2013  . Chest pain, atypical 10/17/2012  . Chronic respiratory failure with hypoxia ([place]) 10/17/2012  . On home oxygen therapy 10/17/2012  . Hyperkalemia 10/17/2012  . Headache(784.0) 02/20/2012  .  Weight loss 04/17/2011  . Bladder neck obstruction 01/17/2011  . Preventative health care 11/17/2010  . CONSTIPATION 06/12/2010  . FLATULENCE-GAS-BLOATING 06/12/2010  . SCIATICA, LEFT 05/31/2010  . PHIMOSIS 04/06/2009  . TOBACCO USER 01/17/2009  . DYSPHAGIA 07/25/2008  . Osteoarthritis 06/24/2008  . ANEMIA-IRON DEFICIENCY 05/20/2008  . Memory loss 05/18/2008  . DEGENERATIVE DISC DISEASE, LUMBAR SPINE 01/03/2008  . OBSTRUCTIVE SLEEP APNEA 07/03/2007  . Anxiety state 06/24/2007  . DEPRESSION 06/24/2007  . BENIGN PROSTATIC HYPERTROPHY 06/24/2007  . HYPERSOMNIA 06/24/2007  . FATIGUE 06/24/2007  . Urinary hesitancy 06/24/2007  . COLONIC POLYPS 06/12/2007  . Diabetes ([place]) 06/12/2007  . HYPERLIPIDEMIA 06/12/2007  . Essential hypertension 06/12/2007  . ANGINA PECTORIS 06/12/2007  . Coronary atherosclerosis 06/12/2007  . PERIPHERAL VASCULAR DISEASE 06/12/2007  . ESOPHAGEAL STRICTURE 06/12/2007  . GERD 06/12/2007  . DIVERTICULOSIS, COLON 06/12/2007  . HERNIATED DISC 06/12/2007  . SPINAL STENOSIS 06/12/2007  . Lumbago 06/12/2007   Social History   Social History  . Marital Status: Married    Spouse Name: N/A  . Number of Children: 5  . Years of Education: N/A   Occupational History  . Retired-construction    Social History Main Topics  . Smoking status: Current Every Day  Smoker -- 1.00 packs/day for 40 years    Types: Cigarettes  . Smokeless tobacco: Never Used  . Alcohol Use: No  . Drug Use: No  . Sexual Activity: Not on file   Other Topics Concern  . Not on file   Social History Narrative    Mr. Bartolucci's family history includes Emphysema in his father; Kidney cancer in his daughter; Stroke in his father. There is no history of Colon cancer.      Objective:    Filed Vitals:   09/11/15 1339  BP: 118/64   Pulse: 84    Physical Exam well-developed older white male in no acute distress, ambulates with a cane accompanied by his wife blood pressure 118/64 pulse 84 height 5 foot 6 weight 143 BMI of 23. O2 sat 92% on room air. HEENT ;nontraumatic normocephalic EOMI PERRLA, Cardiovascular; regular rate and rhythm with S1-S2 no murmur rub or gallop sternal incisional scar, Pulmonary; scattered rhonchi and faint wheezes laterally, Abdomen; soft basically nontender there is no palpable mass or hepatosplenomegaly bowel sounds are present, Rectal ;exam not done, Extremities; no clubbing cyanosis or edema skin warm and dry, Neuropsych ;mood and affect appropriate he is somewhat hard of hearing     Assessment & Plan:   #65 75 year old white male with recent weight loss and mid abdominal discomfort in setting of known severe COPD. CT of the abdomen concerning for sigmoid colon cancer versus stricture #2 O2 dependent COPD #3 chronic kidney disease stage III #4.onset diabetes mellitus #5 continued smoking #6 coronary artery disease status post CABG and stents all remote #7 GERD with history of peptic stricture  Plan; long discussion with the patient and his wife. He needs to have colonoscopy but is at high risk for pulmonary complications given O2 dependent COPD. Procedure, risks and benefits were discussed with the patient in detail and he is agreeable to proceed. Procedure will need to be done at the hospital and is scheduled for Friday, July 7 with MAC at [facility], with Dr. Henrene Pastor.   Amy Genia Harold PA-C 09/11/2015   Cc: [name], DO              Irene Shipper, MD at 09/12/2015 10:22 AM     Status: Signed       Expand All Collapse All   Reviewed. Agree with plans for colonoscopy to rule out neoplastic mass versus severe diverticular changes in the sigmoid colon. High-risk patient to be done at Hospital                No interval change. Patient here today with  wife. Did well on prep. For colonoscopy.  Docia Chuck. Geri [place]., M.D. [facility] Division of Gastroenterology

## 2015-10-13 NOTE — Transfer of Care (Signed)
Immediate Anesthesia Transfer of Care Note  Patient: Tyler DEMAN Sr.  Procedure(s) Performed: Procedure(s): COLONOSCOPY (N/A)  Patient Location: PACU  Anesthesia Type:MAC  Level of Consciousness: awake, alert , oriented, patient cooperative and responds to stimulation  Airway & Oxygen Therapy: Patient Spontanous Breathing and Patient connected to nasal cannula oxygen  Post-op Assessment: Report given to RN and Post -op Vital signs reviewed and stable  Post vital signs: Reviewed and stable  Last Vitals:  Filed Vitals:   10/13/15 0805 10/13/15 0942  BP: 125/70   Pulse:  91  Temp: 36.5 C   Resp: 16 17    Last Pain: There were no vitals filed for this visit.       Complications: No apparent anesthesia complications

## 2015-10-13 NOTE — Op Note (Signed)
[facility] Patient Name: [name] Procedure Date : 10/13/2015 MRN: [id number] Attending MD: Docia Chuck. Henrene Pastor , MD Date of Birth: 01-Mar-1941 CSN: [id number] Age: 75 Admit Type: Outpatient Procedure:                Colonoscopy Indications:              Abnormal CT of the GI tract Providers:                Docia Chuck. Henrene Pastor, MD, Cleda Daub, RN, William Dalton, Technician Referring MD:              Medicines:                Monitored Anesthesia Care Complications:            No immediate complications. Estimated blood loss:                            None. Estimated Blood Loss:     Estimated blood loss: none. Procedure:                Pre-Anesthesia Assessment:                           - Prior to the procedure, a History and Physical                            was performed, and patient medications and                            allergies were reviewed. The patient's tolerance of                            previous anesthesia was also reviewed. The risks                            and benefits of the procedure and the sedation                            options and risks were discussed with the patient.                            All questions were answered, and informed consent                            was obtained. Prior Anticoagulants: The patient has                            taken no previous anticoagulant or antiplatelet                            agents. ASA Grade Assessment: III - A patient with  severe systemic disease. After reviewing the risks                            and benefits, the patient was deemed in                            satisfactory condition to undergo the procedure.                           After obtaining informed consent, the colonoscope                            was passed under direct vision. Throughout the                            procedure, the patient's blood pressure, pulse,  and                            oxygen saturations were monitored continuously. The                            [id number]) scope was introduced through                            the anus and advanced to the the cecum, identified                            by appendiceal orifice and ileocecal valve. The                            ileocecal valve, appendiceal orifice, and rectum                            were photographed. The quality of the bowel                            preparation was adequate. The colonoscopy was                            performed without difficulty. The patient tolerated                            the procedure well. The bowel preparation used was                            SUPREP. Scope In: 9:25:07 AM Scope Out: 9:34:58 AM Scope Withdrawal Time: 0 hours 5 minutes 14 seconds  Total Procedure Duration: 0 hours 9 minutes 51 seconds  Findings:      A few small-mouthed diverticula were found in the sigmoid colon. No mass       or stricture.      The exam was otherwise without abnormality on direct and retroflexion       views. Impression:               - Diverticulosis  in the sigmoid colon. No mass or                            stricture.                           - The examination was otherwise normal on direct                            and retroflexion views.                           - No specimens collected. Moderate Sedation:      none Recommendation:           - Repeat colonoscopy is not recommended .                           - Patient has a contact number available for                            emergencies. The signs and symptoms of potential                            delayed complications were discussed with the                            patient. Return to normal activities tomorrow.                            Written discharge instructions were provided to the                            patient.                           - Resume previous  diet.                           - Continue present medications. Procedure Code(s):        --- Professional ---                           [phone], Colonoscopy, flexible; diagnostic, including                            collection of specimen(s) by brushing or washing,                            when performed (separate procedure) Diagnosis Code(s):        --- Professional ---                           K57.30, Diverticulosis of large intestine without                            perforation or abscess without bleeding  R93.3, Abnormal findings on diagnostic imaging of                            other parts of digestive tract CPT copyright 2016 [employer]. All rights reserved. The codes documented in this report are preliminary and upon coder review may  be revised to meet current compliance requirements. Docia Chuck. Henrene Pastor, MD 10/13/2015 9:40:06 AM This report has been signed electronically. Number of Addenda: 0

## 2015-10-13 NOTE — Discharge Instructions (Signed)
Colonoscopy, Care After °Refer to this sheet in the next few weeks. These instructions provide you with information on caring for yourself after your procedure. Your health care provider may also give you more specific instructions. Your treatment has been planned according to current medical practices, but problems sometimes occur. Call your health care provider if you have any problems or questions after your procedure. °WHAT TO EXPECT AFTER THE PROCEDURE  °After your procedure, it is typical to have the following: °· A small amount of blood in your stool. °· Moderate amounts of gas and mild abdominal cramping or bloating. °HOME CARE INSTRUCTIONS °· Do not drive, operate machinery, or sign important documents for 24 hours. °· You may shower and resume your regular physical activities, but move at a slower pace for the first 24 hours. °· Take frequent rest periods for the first 24 hours. °· Walk around or put a warm pack on your abdomen to help reduce abdominal cramping and bloating. °· Drink enough fluids to keep your urine clear or pale yellow. °· You may resume your normal diet as instructed by your health care provider. Avoid heavy or fried foods that are hard to digest. °· Avoid drinking alcohol for 24 hours or as instructed by your health care provider. °· Only take over-the-counter or prescription medicines as directed by your health care provider. °· If a tissue sample (biopsy) was taken during your procedure: °¨ Do not take aspirin or blood thinners for 7 days, or as instructed by your health care provider. °¨ Do not drink alcohol for 7 days, or as instructed by your health care provider. °¨ Eat soft foods for the first 24 hours. °SEEK MEDICAL CARE IF: °You have persistent spotting of blood in your stool 2-3 days after the procedure. °SEEK IMMEDIATE MEDICAL CARE IF: °· You have more than a small spotting of blood in your stool. °· You pass large blood clots in your stool. °· Your abdomen is swollen  (distended). °· You have nausea or vomiting. °· You have a fever. °· You have increasing abdominal pain that is not relieved with medicine. °  °This information is not intended to replace advice given to you by your health care provider. Make sure you discuss any questions you have with your health care provider. °  °Document Released: 11/07/2003 Document Revised: 01/13/2013 Document Reviewed: 11/30/2012 °Elsevier Interactive Patient Education ©2016 [employer] ° °

## 2015-10-17 ENCOUNTER — Ambulatory Visit (INDEPENDENT_AMBULATORY_CARE_PROVIDER_SITE_OTHER): Payer: Medicare Other | Admitting: Osteopathic Medicine

## 2015-10-17 ENCOUNTER — Encounter: Payer: Self-pay | Admitting: Osteopathic Medicine

## 2015-10-17 VITALS — BP 131/75 | HR 94 | Ht 66.0 in | Wt 149.0 lb

## 2015-10-17 DIAGNOSIS — G4700 Insomnia, unspecified: Secondary | ICD-10-CM | POA: Diagnosis not present

## 2015-10-17 DIAGNOSIS — F411 Generalized anxiety disorder: Secondary | ICD-10-CM

## 2015-10-17 MED ORDER — ALPRAZOLAM 0.25 MG PO TABS: 0.2500 mg | ORAL_TABLET | Freq: Two times a day (BID) | ORAL | Status: DC | PRN

## 2015-10-17 MED ORDER — DONEPEZIL HCL 10 MG PO TABS: 10.0000 mg | ORAL_TABLET | Freq: Every day | ORAL | Status: DC

## 2015-10-18 NOTE — Progress Notes (Signed)
HPI: Tyler NIX Sr. is a 75 y.o. Not Hispanic or Latino male  who presents to [facility] today, 10/18/2015,  for chief complaint of:  Chief Complaint  Patient presents with  . Follow-up     Had received refill request for this patient Xanax. He is taking it typically nightly to help with sleep as well as additional pills as needed for acute anxiety issues. Patient states that often he will take 1 pill around dinner time and an additional 2 prior to bed. This drug had been prescribed by his previous PCP up until now. Patient requests continuation of this medication at current dose. I had previously given him shirt-term refills pills to hold him over until this appointment for further discussion. Previous visits, I also mentioned that he should try to decrease use of this medication but he has had difficulty doing so.  [place] Controlled substance database reviewed: Previously getting Alprazolam 0.25 mg #90 tabs per 30 days from Dr. Cathlean Cower, refilling fairly consistently on a monthly basis. Patient also also receiving hydrocodone pain medications from pain management as well as Ambien prescriptions from psychiatry.   Patient is accompanied by wife who assists with history-taking.    Past medical, surgical, social and family history reviewed: Past Medical History  Diagnosis Date  . COPD (chronic obstructive pulmonary disease) ([place])   . Allergy   . Emphysema of lung ([place])   . GERD (gastroesophageal reflux disease)   . Hypertension   . Hyperlipidemia   . CAD (coronary artery disease)     a. s/p multiple PCIs to RCA and eventual CABG in 1999;  b.LHC 11/2003: CFX 95%, RCA occluded, SVG-RCA occluded, SVG-OM patent, native LAD patent. EF was 55%.  Patient had left to right collaterals and medical therapy was recommended    . Anxiety   . PVD (peripheral vascular disease) ([place])   . OA (osteoarthritis)   . Asthma   . Iron deficiency anemia   . Memory loss    . Insomnia   . DDD (degenerative disc disease), lumbar   . Depression   . Benign prostatic hypertrophy   . Lumbar back pain     chronic  . Spinal stenosis   . Herniated disc   . History of colonic polyps   . Diverticulitis, colon   . Esophageal stricture   . Hx of colonoscopy   . Hiatal hernia   . HOH (hard of hearing)   . Shortness of breath dyspnea     on home O2  . OSA (obstructive sleep apnea)     wife denies this  . Pneumonia   . Diabetes mellitus     on no medications   Past Surgical History  Procedure Laterality Date  . Heart bypass    . Bilateral knee replacements    . Hemorrhoid surgery    . Carpal tunnel release    . Shoulder arthroscopy  right  . Coronary artery bypass graft  09/1997    double bypss  . Coronary angioplasty with stent placement  2/99  . Strabismus surgery  08/29/2011    Procedure: REPAIR STRABISMUS;  Surgeon: Derry Skill, MD;  Location: [facility];  Service: Ophthalmology;  Laterality: Left;  . Colonoscopy N/A 10/13/2015    Procedure: COLONOSCOPY;  Surgeon: Irene Shipper, MD;  Location: [facility];  Service: Endoscopy;  Laterality: N/A;   Social History  Substance Use Topics  . Smoking status: Current Every Day Smoker -- 1.00 packs/day  for 40 years    Types: Cigarettes  . Smokeless tobacco: Never Used  . Alcohol Use: No     Comment: none for 20 years    Family History  Problem Relation Age of Onset  . Stroke Father   . Emphysema Father   . Heart disease    . Hypertension    . Hyperlipidemia    . Kidney cancer Daughter   . Allergies    . Colon cancer Neg Hx   . Diabetes       Current medication list and allergy/intolerance information reviewed:   Current Outpatient Prescriptions  Medication Sig Dispense Refill  . albuterol (VENTOLIN HFA) 108 (90 BASE) MCG/ACT inhaler Inhale 2 puffs into the lungs every 6 (six) hours as needed for wheezing or shortness of breath. 1 Inhaler 11  . ALPRAZolam (XANAX) 0.25 MG tablet Take 1 tablet (0.25  mg total) by mouth 2 (two) times daily as needed for anxiety or sleep. #45 tablets for 30 days unless discuss otherwise with Dr. Sheppard Coil. 45 tablet 0  . AMBULATORY NON FORMULARY MEDICATION 2.5 Liters home oxygen continuous    . Aspirin-Salicylamide-Caffeine (BC HEADACHE POWDER PO) Take 1 Package by mouth 4 (four) times daily as needed (headache or pain).    Marland Kitchen donepezil (ARICEPT) 10 MG tablet Take 1 tablet (10 mg total) by mouth at bedtime. 30 tablet   . DULoxetine (CYMBALTA) 30 MG capsule     . DULoxetine (CYMBALTA) 60 MG capsule Take 1 capsule (60 mg total) by mouth daily. 90 capsule 3  . fluticasone (FLONASE) 50 MCG/ACT nasal spray instill 2 sprays into each nostril once daily (Patient taking differently: instill 2 sprays into each nostril once daily as needed) 16 g 11  . Fluticasone Furoate-Vilanterol (BREO ELLIPTA) 100-25 MCG/INH AEPB Inhale 1 puff into the lungs daily. 1 each 11  . furosemide (LASIX) 20 MG tablet Take 0.5 tablets (10 mg total) by mouth 2 (two) times daily. 30 tablet 0  . HYDROcodone-acetaminophen (NORCO) 10-325 MG tablet take 1 tablet by mouth every 4 to 6 hours if needed for pain (NO MORE THAN 5 PER DAY)  0  . lamoTRIgine (LAMICTAL) 25 MG tablet take 1 tablet at bedtime for 14 days then 1 tablet twice a day  0  . Multiple Vitamin (MULTIVITAMIN) capsule Take 1 capsule by mouth daily.     . nitroGLYCERIN (NITROSTAT) 0.4 MG SL tablet Place 1 tablet (0.4 mg total) under the tongue every 5 (five) minutes as needed. For chest pain 25 tablet 6  . omeprazole (PRILOSEC) 20 MG capsule take 1 capsule by mouth twice a day before meals 60 capsule 11  . pravastatin (PRAVACHOL) 80 MG tablet take 1 tablet by mouth once daily (Patient taking differently: take 1 tablet by mouth once daily at bedtime) 90 tablet 3  . terbinafine (LAMISIL AT) 1 % cream Apply 1 application topically 2 (two) times daily. 30 g 0  . umeclidinium bromide (INCRUSE ELLIPTA) 62.5 MCG/INH AEPB Inhale 1 puff into the lungs  daily. 7 each 3   No current facility-administered medications for this visit.   Allergies  Allergen Reactions  . Celecoxib Nausea And Vomiting    REACTION: GI upset  . Chicken Protein Other (See Comments)    Will not eat chicken  . Fish-Derived Products Other (See Comments)    Will not eat seafood of any kind  . Morphine Itching    REACTION: itching  . Nalbuphine Other (See Comments)    REACTION: contraindication  with oxycontin.  . Prednisone Other (See Comments)    REACTION: "yeast infections"  . Venlafaxine Other (See Comments)    REACTION: GI upset      Review of Systems:  Constitutional:  No  fever, no chills, No recent illness.   HEENT: No  headache, no vision change  Cardiac: No  chest pain,  Respiratory:  No  shortness of breath. (+) chronic Cough  Neurologic: No  weakness, No  dizziness, No  slurred speech/focal weakness/facial droop  Psychiatric: No  concerns with depression, (+) concerns with anxiety, (+) sleep problems, No mood problems  Exam:  BP 131/75 mmHg  Pulse 94  Ht 5\' 6"  (1.676 m)  Wt 149 lb (67.586 kg)  BMI 24.06 kg/m2  Constitutional: VS see above. General Appearance: alert, well-developed, well-nourished, NAD  Psychiatric: Normal judgment/insight. Normal mood and affect. Oriented x3.    No results found for this or any previous visit (from the past 72 hour(s)).  No results found.   ASSESSMENT/PLAN:   Had long discussion with patient that I am not particularly enthusiastic about prescribing long-term benzodiazepines in elderly patients with other comorbidities such as he has, due to risk of delirium, falls, dependence, and other comorbidities/injury associated with overuse of this medicine as well as withdrawal symptoms, and there is also some concern in the literature for possible dementia development. In this particular patient, he also has comorbidities related to respiratory conditions/COPD, he also has prescription on file for Ambien  use. However, patient has been on alprazolam from a previous prescriber for many years and has had extreme difficulty tapering off on the dose.  Patient has full capacity to understand the risks of this medicine and its associated possible side effects and disease states, even death in extreme cases due to overdose, injury, and other adverse events. Given full knowledge of these risks, patient requests continuation of this medicine. I am willing to reduce the frequency of dosing for this medication, will prescribe 45 pills for 30 days use. Absolutely will not continue prescription of Ambien along with this medication, patient is to dispose of this medicine and will not be getting additional refills on it. I reserve the right to monitor controlled substance database, if he is continuing to fill Ambien prescriptions I will certainly discontinue the Xanax as well. I reserve the right to change or discontinue the dose if I have additional serious concerns about the patient's safety. Patient is welcome to find another prescriber if he is not satisfied with this plan. Patient verbalizes understanding, accepts written prescription for 45 pills for 30 days.   Insomnia  Anxiety state     Visit summary with medication list and pertinent instructions was printed for patient to review. All questions at time of visit were answered - patient instructed to contact office with any additional concerns. ER/RTC precautions were reviewed with the patient. Follow-up plan: Return in about 3 months (around 01/17/2016) for [place] .  Note: Total time spent 25 minutes, greater than 50% of the visit was spent face-to-face counseling and coordinating care for the following: The primary encounter diagnosis was Insomnia. A diagnosis of Anxiety state was also pertinent to this visit.Marland Kitchen

## 2015-10-19 ENCOUNTER — Other Ambulatory Visit: Payer: Self-pay | Admitting: Osteopathic Medicine

## 2015-10-26 ENCOUNTER — Institutional Professional Consult (permissible substitution): Payer: Medicare Other | Admitting: Pulmonary Disease

## 2015-10-30 ENCOUNTER — Ambulatory Visit: Payer: Medicare Other | Admitting: Cardiovascular Disease

## 2015-10-31 ENCOUNTER — Ambulatory Visit (INDEPENDENT_AMBULATORY_CARE_PROVIDER_SITE_OTHER): Payer: Medicare Other | Admitting: Pulmonary Disease

## 2015-10-31 ENCOUNTER — Encounter: Payer: Self-pay | Admitting: Pulmonary Disease

## 2015-10-31 ENCOUNTER — Other Ambulatory Visit: Payer: Medicare Other

## 2015-10-31 ENCOUNTER — Telehealth: Payer: Self-pay | Admitting: Pulmonary Disease

## 2015-10-31 VITALS — BP 104/60 | HR 50 | Ht 66.0 in | Wt 151.6 lb

## 2015-10-31 DIAGNOSIS — Z77090 Contact with and (suspected) exposure to asbestos: Secondary | ICD-10-CM

## 2015-10-31 DIAGNOSIS — F17200 Nicotine dependence, unspecified, uncomplicated: Secondary | ICD-10-CM | POA: Diagnosis not present

## 2015-10-31 DIAGNOSIS — IMO0001 Reserved for inherently not codable concepts without codable children: Secondary | ICD-10-CM

## 2015-10-31 DIAGNOSIS — J449 Chronic obstructive pulmonary disease, unspecified: Secondary | ICD-10-CM

## 2015-10-31 DIAGNOSIS — R911 Solitary pulmonary nodule: Secondary | ICD-10-CM

## 2015-10-31 DIAGNOSIS — J9611 Chronic respiratory failure with hypoxia: Secondary | ICD-10-CM | POA: Diagnosis not present

## 2015-10-31 NOTE — Patient Instructions (Signed)
   Continue your inhalers as prescribed.  We will contact you with the results of your CT scan once they are available  We will do a breathing & walking test when I see you back in 3 months  Call me if you have any new breathing problems before your next appointment  TESTS ORDERED: 1. Alpha-1 Antitrypsin Phenotype 2. CT Chest w/o contrast [facility] [place] 3. Full PFTs at follow-up 4. 6MWT on room air at follow-up

## 2015-10-31 NOTE — Telephone Encounter (Signed)
PFT 05/09/15: FVC 2.85 L (74%) FEV1 1.37 L (46%) FEV1/FVC 0.48 FEF 25-75 0.75 L (27%) positive bronchodilator response 08/14/07: FVC 3.08 L (79%) FEV1 1.95 L (64%) FEV1/FVC 0.63 FEF 25-75 1.02 L (32%) positive bronchodilator response TLC 7.21 L (118%) RV 167% DLCO uncorrected 91%  POLYSOMNOGRAM (5/12/9): No apneas or hypoxia is noted during entire night. Patient never achieved slow-wave sleep or REM. Lowest saturation 81%.  IMAGING CT CHEST W/ 08/29/15 (personally reviewed by me): Approximately 5 mm nodule right upper lobe. Chronic tree-in-bud opacity in right lower lobe & lingula. Cyst noted in upper pole of right kidney. No pleural effusion or thickening. No pericardial effusion. Hiatal hernia noted. No pathologic mediastinal adenopathy. Apical predominant emphysematous changes with some subpleural bleb formation bilaterally. Additionally some patchy bilateral subpleural opacification and reticulation.

## 2015-10-31 NOTE — Progress Notes (Signed)
Subjective:    Patient ID: Tyler Oh., male    DOB: [birth year], 75 y.o.   MRN: [id number]  HPI Patient planned for left shoulder surgery under general anesthesia.  He does sleep with 2.5 L/m oxygen at night. He has known severe COPD. Currently he is on Incruse inhaler. He also has an albuterol rescue inhaler that he uses approximately once weekly. He reports he has flares a couple of times yearly. Reports infrequent coughing. Does have chronic wheezing. No nocturnal awakenings with any breathing problems. No history of intubation for an exacerbation. He reports he is able to walk 1/4 mile without stopping and walks on a regular basis. No chest pain or pressure. No fever, chills, or sweats. No nausea, emesis, or abdominal pain.    Review of Systems No rashes or abnormal bruising. No acute vision changes. No focal weakness, numbness, or tingling. A pertinent 14 point review of systems is negative except as per the history of presenting illness.  Allergies  Allergen Reactions  . Celecoxib Nausea And Vomiting    REACTION: GI upset  . Chicken Protein Other (See Comments)    Will not eat chicken  . Fish-Derived Products Other (See Comments)    Will not eat seafood of any kind  . Morphine Itching    REACTION: itching  . Nalbuphine Other (See Comments)    REACTION: contraindication with oxycontin.  . Prednisone Other (See Comments)    REACTION: "yeast infections"  . Venlafaxine Other (See Comments)    REACTION: GI upset    Current Outpatient Prescriptions on File Prior to Visit  Medication Sig Dispense Refill  . albuterol (VENTOLIN HFA) 108 (90 BASE) MCG/ACT inhaler Inhale 2 puffs into the lungs every 6 (six) hours as needed for wheezing or shortness of breath. 1 Inhaler 11  . ALPRAZolam (XANAX) 0.25 MG tablet Take 1 tablet (0.25 mg total) by mouth 2 (two) times daily as needed for anxiety or sleep. #45 tablets for 30 days unless discuss otherwise with Dr. Sheppard Coil. 45 tablet 0  .  AMBULATORY NON FORMULARY MEDICATION 2.5 Liters home oxygen continuous    . Aspirin-Salicylamide-Caffeine (BC HEADACHE POWDER PO) Take 1 Package by mouth 4 (four) times daily as needed (headache or pain).    Marland Kitchen donepezil (ARICEPT) 10 MG tablet Take 1 tablet (10 mg total) by mouth at bedtime. 30 tablet   . DULoxetine (CYMBALTA) 30 MG capsule     . DULoxetine (CYMBALTA) 60 MG capsule Take 1 capsule (60 mg total) by mouth daily. 90 capsule 3  . fluticasone (FLONASE) 50 MCG/ACT nasal spray instill 2 sprays into each nostril once daily (Patient taking differently: instill 2 sprays into each nostril once daily as needed) 16 g 11  . furosemide (LASIX) 20 MG tablet take 1/2 tablet by mouth twice a day 30 tablet 0  . HYDROcodone-acetaminophen (NORCO) 10-325 MG tablet take 1 tablet by mouth every 4 to 6 hours if needed for pain (NO MORE THAN 5 PER DAY)  0  . lamoTRIgine (LAMICTAL) 25 MG tablet take 1 tablet at bedtime for 14 days then 1 tablet twice a day  0  . Multiple Vitamin (MULTIVITAMIN) capsule Take 1 capsule by mouth daily.     . nitroGLYCERIN (NITROSTAT) 0.4 MG SL tablet Place 1 tablet (0.4 mg total) under the tongue every 5 (five) minutes as needed. For chest pain 25 tablet 6  . omeprazole (PRILOSEC) 20 MG capsule take 1 capsule by mouth twice a day before meals 60  capsule 11  . pravastatin (PRAVACHOL) 80 MG tablet take 1 tablet by mouth once daily (Patient taking differently: take 1 tablet by mouth once daily at bedtime) 90 tablet 3  . terbinafine (LAMISIL AT) 1 % cream Apply 1 application topically 2 (two) times daily. 30 g 0  . umeclidinium bromide (INCRUSE ELLIPTA) 62.5 MCG/INH AEPB Inhale 1 puff into the lungs daily. 7 each 3   No current facility-administered medications on file prior to visit.     Past Medical History:  Diagnosis Date  . Allergy   . Anxiety   . Asthma   . Benign prostatic hypertrophy   . CAD (coronary artery disease)    a. s/p multiple PCIs to RCA and eventual CABG in  1999;  b.LHC 11/2003: CFX 95%, RCA occluded, SVG-RCA occluded, SVG-OM patent, native LAD patent. EF was 55%.  Patient had left to right collaterals and medical therapy was recommended    . COPD (chronic obstructive pulmonary disease) ([place])   . DDD (degenerative disc disease), lumbar   . Depression   . Diabetes mellitus    on no medications  . Diverticulitis, colon   . Emphysema of lung ([place])   . Esophageal stricture   . GERD (gastroesophageal reflux disease)   . Herniated disc   . Hiatal hernia   . History of colonic polyps   . HOH (hard of hearing)   . Hx of colonoscopy   . Hyperlipidemia   . Hypertension   . Insomnia   . Iron deficiency anemia   . Lumbar back pain    chronic  . Memory loss   . OA (osteoarthritis)   . OSA (obstructive sleep apnea)    wife denies this  . Pneumonia   . PVD (peripheral vascular disease) ([place])   . Shortness of breath dyspnea    on home O2  . Spinal stenosis     Past Surgical History:  Procedure Laterality Date  . bilateral knee replacements    . CARPAL TUNNEL RELEASE    . COLONOSCOPY N/A 10/13/2015   Procedure: COLONOSCOPY;  Surgeon: Irene Shipper, MD;  [facility];  Service: Endoscopy;  Laterality: N/A;  . CORONARY ANGIOPLASTY WITH STENT PLACEMENT  2/99  . CORONARY ARTERY BYPASS GRAFT  09/1997   double bypss  . heart bypass    . HEMORRHOID SURGERY    . SHOULDER ARTHROSCOPY  right  . STRABISMUS SURGERY  08/29/2011   Procedure: REPAIR STRABISMUS;  Surgeon: Derry Skill, MD;  Location: [facility];  Service: Ophthalmology;  Laterality: Left;    Family History  Problem Relation Age of Onset  . Stroke Father   . Emphysema Father   . Heart disease    . Hypertension    . Hyperlipidemia    . Allergies    . Diabetes    . Kidney cancer Daughter   . Breast cancer Daughter   . Colon cancer Neg Hx     Social History   Social History  . Marital status: Married    Spouse name: N/A  . Number of children: 5  . Years of education:  N/A   Occupational History  . Retired-construction    Social History Main Topics  . Smoking status: Current Every Day Smoker    Packs/day: 1.00    Years: 65.00    Types: Cigarettes    Start date: 02/14/1948  . Smokeless tobacco: Never Used     Comment: Peak rate of 3ppd  . Alcohol use No  Comment: none for 20 years   . Drug use: No  . Sexual activity: Not Asked   Other Topics Concern  . None   Social History Narrative   Originally from [place]. Previously lived in [place]. Previously worked in a [employer]. 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 Does have asbestos exposure. No known mold or bird exposure.       Objective:   Physical Exam BP 104/60 (BP Location: Left Arm, Cuff Size: Normal)   Pulse (!) 50   Ht 5\' 6"  (1.676 m)   Wt 151 lb 9.6 oz (68.8 kg)   SpO2 96%   BMI 24.47 kg/m  General:  Awake. Alert. No acute distress. Wife with him today.  Integument:  Warm & dry. No rash on exposed skin.  Lymphatics:  No appreciated cervical or supraclavicular lymphadenoapthy. HEENT:  Moist mucus membranes. No oral ulcers. No scleral icterus.  Cardiovascular:  Regular rate. No edema. Normal S1 & S2. Pulmonary:  Good aeration bilaterally. Mild coarse wheeze bilaterally. Normal work of breathing on room air. Abdomen: Soft. Normal bowel sounds. Nondistended. Grossly nontender. Musculoskeletal:  Normal bulk and tone. Hand grip strength 5/5 bilaterally. No joint deformity or effusion appreciated. Neurological:  CN 2-12 grossly in tact. No meningismus. Moving all 4 extremities equally. Symmetric brachioradialis deep tendon reflexes. Hard of hearing. Psychiatric:  Mood and affect congruent. Speech normal rhythm, rate & tone.   PFT 05/09/15: FVC 2.85 L (74%) FEV1 1.37 L (46%) FEV1/FVC 0.48 FEF 25-75 0.75 L (27%) positive bronchodilator response 08/14/07: FVC 3.08 L (79%) FEV1 1.95 L  (64%) FEV1/FVC 0.63 FEF 25-75 1.02 L (32%) positive bronchodilator response TLC 7.21 L (118%) RV 167% DLCO uncorrected 91%  POLYSOMNOGRAM (5/12/9): No apneas or hypoxia is noted during entire night. Patient never achieved slow-wave sleep or REM. Lowest saturation 81%.  IMAGING CT CHEST W/ 08/29/15 (personally reviewed by me): Approximately 5 mm nodule right upper lobe. Chronic tree-in-bud opacity in right lower lobe & lingula. Cyst noted in upper pole of right kidney. No pleural effusion or thickening. No pericardial effusion. Hiatal hernia noted. No pathologic mediastinal adenopathy. Apical predominant emphysematous changes with some subpleural bleb formation bilaterally. Additionally some patchy bilateral subpleural opacification and reticulation.    Assessment & Plan:  75 year old male with chronic and ongoing tobacco use as well as underlying severe COPD with emphysema. Patient has known history of asbestos exposure without signs on CT imaging. I did review the results of his CT imaging which showed a 5 mm nodule in his right upper lobe which given his family history of malignancy as well as his ongoing history of tobacco use needs to be monitored closely. I did spend over 3 minutes counseling the patient on the need for complete tobacco cessation to prevent worsening of his lung function. His COPD seems to be suboptimally controlled but I feel this is likely secondary to his chronic and ongoing tobacco use. I instructed the patient to contact my office if he had any new breathing problems before his next appointment as I would be happy to see him sooner.  1. Shoulder surgery under general anesthesia: Patient is at moderate risk for perioperative pulmonary complications given the severity of his underlying COPD as well as suboptimal symptom control. This should not preclude his ability to undergo the surgery if medically necessary. Recommend treating the patient was scheduled Duonebs  every 4 hours  perioperatively for any wheezing. Patch really we recommend incentive spirometry every hour while awake postoperatively and early ambulation to minimize atelectasis. 2. Severe COPD w/ Emphysema: Screening for alpha-1 antitrypsin deficiency. Continuing Incruse Ellipta. Checking full pulmonary function testing as well as 6 minute walk test at next appointment. 3. Chronic Hypoxic Respiratory Failure: This is nocturnal as the patient is using oxygen therapy at night. Checking 6 to walk test on room air at next appointment. 4. Tobacco Use Disorder: Counseled patient for over 3 minutes on the need for complete tobacco cessation. He is going to try to quit utilizing nicotine patches and gum for intermittent cravings. Previously intolerant of Chantix with adverse reaction. 5. RUL Nodule: Seen on CT imaging May 2017. Checking CT chest without contrast in August 2017. 6. Follow-up: Patient to return to clinic in 3 months or sooner if needed.  Sonia Baller Ashok Cordia, M.D. [facility] Pulmonary & Critical Care Pager:  [phone] After 3pm or if no response, call [phone] 3:01 PM 10/31/15

## 2015-11-01 ENCOUNTER — Ambulatory Visit (INDEPENDENT_AMBULATORY_CARE_PROVIDER_SITE_OTHER): Payer: Medicare Other | Admitting: Cardiovascular Disease

## 2015-11-01 ENCOUNTER — Encounter: Payer: Self-pay | Admitting: Cardiovascular Disease

## 2015-11-01 VITALS — BP 124/64 | HR 61 | Ht 66.0 in | Wt 149.8 lb

## 2015-11-01 DIAGNOSIS — I2510 Atherosclerotic heart disease of native coronary artery without angina pectoris: Secondary | ICD-10-CM

## 2015-11-01 DIAGNOSIS — E785 Hyperlipidemia, unspecified: Secondary | ICD-10-CM

## 2015-11-01 DIAGNOSIS — M25511 Pain in right shoulder: Secondary | ICD-10-CM | POA: Diagnosis not present

## 2015-11-01 DIAGNOSIS — M79662 Pain in left lower leg: Secondary | ICD-10-CM | POA: Diagnosis not present

## 2015-11-01 DIAGNOSIS — Z01810 Encounter for preprocedural cardiovascular examination: Secondary | ICD-10-CM | POA: Diagnosis not present

## 2015-11-01 DIAGNOSIS — M25552 Pain in left hip: Secondary | ICD-10-CM | POA: Diagnosis not present

## 2015-11-01 DIAGNOSIS — M79661 Pain in right lower leg: Secondary | ICD-10-CM | POA: Diagnosis not present

## 2015-11-01 DIAGNOSIS — Z720 Tobacco use: Secondary | ICD-10-CM

## 2015-11-01 DIAGNOSIS — M25562 Pain in left knee: Secondary | ICD-10-CM | POA: Diagnosis not present

## 2015-11-01 DIAGNOSIS — M5416 Radiculopathy, lumbar region: Secondary | ICD-10-CM | POA: Diagnosis not present

## 2015-11-01 DIAGNOSIS — G894 Chronic pain syndrome: Secondary | ICD-10-CM | POA: Diagnosis not present

## 2015-11-01 DIAGNOSIS — I10 Essential (primary) hypertension: Secondary | ICD-10-CM

## 2015-11-01 DIAGNOSIS — M25551 Pain in right hip: Secondary | ICD-10-CM | POA: Diagnosis not present

## 2015-11-01 NOTE — Progress Notes (Signed)
Chief Complaint  Patient presents with  . Follow-up    surgery clearance       History of Present Illness: 75 yo male with history of CAD s/p CABG 1999, HTN, HLD, severe COPD, ongoing tobacco abuse, DM, GERD, PAD here today for follow up. He has been followed by Dr. [name]. I met him in July 2015. Last cath in August 2005 demonstrated mild LAD disease, patent large OM branch with occluded distal Circumflex filling by vein graft, occluded RCA with occluded vein graft to RCA, RCA filling from left to right collaterals. He has been managed medically since that time. He continues to smoke. When I met him in July 2015, he c/o chest pain. I arranged a stress myoview but he cancelled the test.   He is here today for follow up and for pre-operative examination before planned left shoulder replacement. He denies chest pain. He has dyspnea at baseline due to severe COPD. This has not changed He is still smoking 1ppd. Occasional leg pain. No claudication, rest pain or ulcerations.    Primary Care Physician: [name], DO   Past Medical History:  Diagnosis Date  . Allergy   . Anxiety   . Asthma   . Benign prostatic hypertrophy   . CAD (coronary artery disease)    a. s/p multiple PCIs to RCA and eventual CABG in 1999;  b.LHC 11/2003: CFX 95%, RCA occluded, SVG-RCA occluded, SVG-OM patent, native LAD patent. EF was 55%.  Patient had left to right collaterals and medical therapy was recommended    . COPD (chronic obstructive pulmonary disease) ([place])   . DDD (degenerative disc disease), lumbar   . Depression   . Diabetes mellitus    on no medications  . Diverticulitis, colon   . Emphysema of lung ([place])   . Esophageal stricture   . GERD (gastroesophageal reflux disease)   . Herniated disc   . Hiatal hernia   . History of colonic polyps   . HOH (hard of hearing)   . Hx of colonoscopy   . Hyperlipidemia   . Hypertension   . Insomnia   . Iron deficiency anemia   . Lumbar back pain    chronic  . Memory loss   . OA (osteoarthritis)   . OSA (obstructive sleep apnea)    wife denies this  . Pneumonia   . PVD (peripheral vascular disease) ([place])   . Shortness of breath dyspnea    on home O2  . Spinal stenosis     Past Surgical History:  Procedure Laterality Date  . bilateral knee replacements    . CARPAL TUNNEL RELEASE    . COLONOSCOPY N/A 10/13/2015   Procedure: COLONOSCOPY;  Surgeon: Irene Shipper, MD;  Location[facility] ENDOSCOPY;  Service: Endoscopy;  Laterality: N/A;  . CORONARY ANGIOPLASTY WITH STENT PLACEMENT  2/99  . CORONARY ARTERY BYPASS GRAFT  09/1997   double bypss  . heart bypass    . HEMORRHOID SURGERY    . SHOULDER ARTHROSCOPY  right  . STRABISMUS SURGERY  08/29/2011   Procedure: REPAIR STRABISMUS;  Surgeon: Derry Skill, MD;  Location: [facility];  Service: Ophthalmology;  Laterality: Left;    Current Outpatient Prescriptions  Medication Sig Dispense Refill  . albuterol (VENTOLIN HFA) 108 (90 BASE) MCG/ACT inhaler Inhale 2 puffs into the lungs every 6 (six) hours as needed for wheezing or shortness of breath. 1 Inhaler 11  . ALPRAZolam (XANAX) 0.25 MG tablet Take 1 tablet (0.25 mg  total) by mouth 2 (two) times daily as needed for anxiety or sleep. #45 tablets for 30 days unless discuss otherwise with Dr. Sheppard Coil. 45 tablet 0  . AMBULATORY NON FORMULARY MEDICATION 2.5 Liters home oxygen continuous    . Aspirin-Salicylamide-Caffeine (BC HEADACHE POWDER PO) Take 1 Package by mouth 4 (four) times daily as needed (headache or pain).    Marland Kitchen donepezil (ARICEPT) 10 MG tablet Take 1 tablet (10 mg total) by mouth at bedtime. 30 tablet   . DULoxetine (CYMBALTA) 30 MG capsule     . DULoxetine (CYMBALTA) 60 MG capsule Take 1 capsule (60 mg total) by mouth daily. 90 capsule 3  . fluticasone (FLONASE) 50 MCG/ACT nasal spray instill 2 sprays into each nostril once daily (Patient taking differently: instill 2 sprays into each nostril once daily as needed) 16 g 11  . furosemide  (LASIX) 20 MG tablet take 1/2 tablet by mouth twice a day 30 tablet 0  . HYDROcodone-acetaminophen (NORCO) 10-325 MG tablet take 1 tablet by mouth every 4 to 6 hours if needed for pain (NO MORE THAN 5 PER DAY)  0  . lamoTRIgine (LAMICTAL) 25 MG tablet take 1 tablet at bedtime for 14 days then 1 tablet twice a day  0  . Multiple Vitamin (MULTIVITAMIN) capsule Take 1 capsule by mouth daily.     . nitroGLYCERIN (NITROSTAT) 0.4 MG SL tablet Place 1 tablet (0.4 mg total) under the tongue every 5 (five) minutes as needed. For chest pain 25 tablet 6  . omeprazole (PRILOSEC) 20 MG capsule take 1 capsule by mouth twice a day before meals 60 capsule 11  . pravastatin (PRAVACHOL) 80 MG tablet take 1 tablet by mouth once daily (Patient taking differently: take 1 tablet by mouth once daily at bedtime) 90 tablet 3  . terbinafine (LAMISIL AT) 1 % cream Apply 1 application topically 2 (two) times daily. 30 g 0  . umeclidinium bromide (INCRUSE ELLIPTA) 62.5 MCG/INH AEPB Inhale 1 puff into the lungs daily. 7 each 3   No current facility-administered medications for this visit.     Allergies  Allergen Reactions  . Celecoxib Nausea And Vomiting    REACTION: GI upset  . Chicken Protein Other (See Comments)    Will not eat chicken  . Fish-Derived Products Other (See Comments)    Will not eat seafood of any kind  . Morphine Itching    REACTION: itching  . Nalbuphine Other (See Comments)    REACTION: contraindication with oxycontin.  . Prednisone Other (See Comments)    REACTION: "yeast infections"  . Venlafaxine Other (See Comments)    REACTION: GI upset    Social History   Social History  . Marital status: Married    Spouse name: N/A  . Number of children: 5  . Years of education: N/A   Occupational History  . Retired-construction    Social History Main Topics  . Smoking status: Current Every Day Smoker    Packs/day: 1.00    Years: 65.00    Types: Cigarettes    Start date: 02/14/1948  .  Smokeless tobacco: Never Used     Comment: Peak rate of 3ppd  . Alcohol use No     Comment: none for 20 years   . Drug use: No  . Sexual activity: Not on file   Other Topics Concern  . Not on file   Social History Narrative   Originally from [place]. Previously lived in [place]. 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 Does have asbestos exposure. No known mold or bird exposure.     Family History  Problem Relation Age of Onset  . Stroke Father   . Emphysema Father   . Heart disease    . Hypertension    . Hyperlipidemia    . Allergies    . Diabetes    . Kidney cancer Daughter   . Breast cancer Daughter   . Colon cancer Neg Hx     Review of Systems:  As stated in the HPI and otherwise negative.   BP 124/64 (BP Location: Left Arm, Patient Position: Sitting, Cuff Size: Normal)   Pulse 61   Ht '5\' 6"'  (1.676 m)   Wt 149 lb 12.8 oz (67.9 kg)   BMI 24.18 kg/m   Physical Examination: General: Well developed, well nourished, NAD  HEENT: OP clear, mucus membranes moist  SKIN: warm, dry. No rashes. Neuro: No focal deficits  Musculoskeletal: Muscle strength 5/5 all ext  Psychiatric: Mood and affect normal  Neck: No JVD, no carotid bruits, no thyromegaly, no lymphadenopathy.  Lungs:Bilateral wheezes. No rhonci, crackles Cardiovascular: Regular rate and rhythm. No murmurs, gallops or rubs. Abdomen:Soft. Bowel sounds present. Non-tender.  Extremities: No lower extremity edema. Pulses are palpable in the bilateral PT.  Cardiac cath August 2005:  The main left coronary was normal.  The left anterior descending artery had some minimal irregularities, coursed  to the apex of the heart where it bifurcated. It was essentially widely  patent with no significant stenosis and good flow throughout. There was  some bend at the junction of the mid portion  and the junction of the  distal third of the RCA possibly representing bridging. However, there was  no systolic compromise present and there was good flow throughout.  There was bifurcating moderate size first diagonal before SP1 in the  proximal LAD that was normal. There was a small to moderate size DX2 at the  junction of the proximal third that was normal and there was a small DX3  from the mid LAD that was normal.  The circumflex artery gave off a moderate size long OM-1 that had no  significant stenosis, arose very proximally. The moderate size OM-2 had no  significant stenosis just beyond this and bifurcated. There was 50% smooth  narrowing just beyond this and before the PABG branch. The PABG branch  provided grade 3 collaterals to the distal RCA. There were also collaterals  from the distal LAD to the distal RCA. The PDA and PLA were visualized up  to their distal origin.  The circumflex artery beyond the PABG branch had 95% segmental stenosis  before the insertion of the graft which was seen on retrograde filling.  The right coronary was totally occluded in its proximal third just before  the previously placed tandem proximal and mid RCA stents. There was no  antegrade flow.  Saphenous vein graft to the RCA was totally occluded in its proximal portion  with a bullet shaped configuration.  The saphenous vein graft to the obtuse marginal was widely patent. There  was a large valve in the proximal third, but there was excellent flow and an  excellent anastomosis to the distal marginal branch. It filled the distal  marginal branches and its branches well antegrade.  EKG:  EKG is ordered today. The  ekg ordered today demonstrates sinus brady, rate 50 bpm.   Recent Labs: 08/23/2015: Platelets 422; TSH 0.87 09/01/2015: ALT 18; BUN 29; Creat 1.47 09/14/2015: Magnesium 1.9 10/13/2015: Hemoglobin 11.6; Potassium 3.8; Sodium 143   Lipid Panel    Component Value Date/Time   CHOL 127  04/04/2015 1351   TRIG 190 (H) 04/04/2015 1351   HDL 31 (L) 04/04/2015 1351   CHOLHDL 4.1 04/04/2015 1351   VLDL 38 (H) 04/04/2015 1351   LDLCALC 58 04/04/2015 1351   LDLDIRECT 70.0 11/09/2014 1428     Wt Readings from Last 3 Encounters:  11/01/15 149 lb 12.8 oz (67.9 kg)  10/31/15 151 lb 9.6 oz (68.8 kg)  10/17/15 149 lb (67.6 kg)     Other studies Reviewed: Additional studies/ records that were reviewed today include: . Review of the above records demonstrates:    Assessment and Plan:   1. CAD: s/p CABG in 1999. Last cath 2005. He has been on a statin. He is not taking a daily ASA or beta blocker. He uses BC powders every day. He does not wish to add any medications. He does not wish to stop smoking. He knows that his CAD will continue to progress while smoking.   2. Pre-operative cardiovascular examination:  He denies symptoms suggestive of angina, heart failure or arrhythmias. He has known CAD and has refused ischemic testing over the last few years. He has continued to smoke since his bypass surgery 18 years ago. He is now agreeable to proceed with stress testing to risk stratify before his planned surgical procedure. I will arrange a [place] nuclear stress test. I will make further recommendations following his stress test.   3. HTN: BP controlled. No changes.   4. HLD: Lipids controlled. Continue statin.   5.  Tobacco abuse, ongoing: Smoking cessation advised. I spent 10 minutes counseling him to stop. He does not wish to stop smoking. He knows continued smoking will be detrimental to his health.  Current medicines are reviewed at length with the patient today.  The patient does not have concerns regarding medicines.  The following changes have been made:  no change  Labs/ tests ordered today include:   Orders Placed This Encounter  Procedures  . Myocardial Perfusion Imaging  . EKG 12-Lead    Disposition:   FU with me in 12  months  Signed, Lauree Chandler,  MD 11/01/2015 4:25 PM    [facility] [address], [place],   [address] Phone: [phone]; Fax: [phone]

## 2015-11-01 NOTE — Patient Instructions (Signed)
Medication Instructions:  Your physician recommends that you continue on your current medications as directed. Please refer to the Current Medication list given to you today.   Labwork: none  Testing/Procedures: Your physician has requested that you have a lexiscan myoview. For further information please visit [email or web address]. Please follow instruction sheet, as given.    Follow-Up: Your physician wants you to follow-up in: 12 months.  You will receive a reminder letter in the mail two months in advance. If you don't receive a letter, please call our office to schedule the follow-up appointment.   Any Other Special Instructions Will Be Listed Below (If Applicable).     If you need a refill on your cardiac medications before your next appointment, please call your pharmacy.

## 2015-11-02 ENCOUNTER — Ambulatory Visit (INDEPENDENT_AMBULATORY_CARE_PROVIDER_SITE_OTHER): Payer: Medicare Other

## 2015-11-02 DIAGNOSIS — I700 Atherosclerosis of aorta: Secondary | ICD-10-CM

## 2015-11-02 DIAGNOSIS — J439 Emphysema, unspecified: Secondary | ICD-10-CM | POA: Diagnosis not present

## 2015-11-02 DIAGNOSIS — IMO0001 Reserved for inherently not codable concepts without codable children: Secondary | ICD-10-CM

## 2015-11-02 DIAGNOSIS — R911 Solitary pulmonary nodule: Secondary | ICD-10-CM | POA: Diagnosis not present

## 2015-11-02 DIAGNOSIS — K449 Diaphragmatic hernia without obstruction or gangrene: Secondary | ICD-10-CM | POA: Diagnosis not present

## 2015-11-02 LAB — ALPHA-1-ANTITRYPSIN: A-1 Antitrypsin, Ser: 186 mg/dL (ref 83–199)

## 2015-11-06 ENCOUNTER — Encounter: Payer: Self-pay | Admitting: Pulmonary Disease

## 2015-11-14 ENCOUNTER — Telehealth (HOSPITAL_COMMUNITY): Payer: Self-pay | Admitting: *Deleted

## 2015-11-14 NOTE — Telephone Encounter (Signed)
Left message on voicemail per DPR in reference to upcoming appointment scheduled on 11/17/15 at 1100 with detailed instructions given per Myocardial Perfusion Study Information Sheet for the test. LM to arrive 15 minutes early, and that it is imperative to arrive on time for appointment to keep from having the test rescheduled. If you need to cancel or reschedule your appointment, please call the office within 24 hours of your appointment. Failure to do so may result in a cancellation of your appointment, and a $50 no show fee. Phone number given for call back for any questions.

## 2015-11-17 ENCOUNTER — Encounter (HOSPITAL_COMMUNITY): Payer: Medicare Other

## 2015-11-20 ENCOUNTER — Other Ambulatory Visit: Payer: Self-pay | Admitting: Osteopathic Medicine

## 2015-11-23 DIAGNOSIS — F411 Generalized anxiety disorder: Secondary | ICD-10-CM | POA: Diagnosis not present

## 2015-11-23 DIAGNOSIS — F332 Major depressive disorder, recurrent severe without psychotic features: Secondary | ICD-10-CM | POA: Diagnosis not present

## 2015-11-23 DIAGNOSIS — F0281 Dementia in other diseases classified elsewhere with behavioral disturbance: Secondary | ICD-10-CM | POA: Diagnosis not present

## 2015-11-25 ENCOUNTER — Other Ambulatory Visit: Payer: Self-pay | Admitting: Internal Medicine

## 2015-11-27 ENCOUNTER — Other Ambulatory Visit: Payer: Self-pay | Admitting: Osteopathic Medicine

## 2015-11-28 ENCOUNTER — Other Ambulatory Visit: Payer: Self-pay | Admitting: Osteopathic Medicine

## 2015-11-29 ENCOUNTER — Encounter: Payer: Medicare Other | Admitting: Osteopathic Medicine

## 2015-11-30 DIAGNOSIS — G894 Chronic pain syndrome: Secondary | ICD-10-CM | POA: Diagnosis not present

## 2015-11-30 DIAGNOSIS — Z79891 Long term (current) use of opiate analgesic: Secondary | ICD-10-CM | POA: Diagnosis not present

## 2015-11-30 DIAGNOSIS — M79661 Pain in right lower leg: Secondary | ICD-10-CM | POA: Diagnosis not present

## 2015-11-30 DIAGNOSIS — M79662 Pain in left lower leg: Secondary | ICD-10-CM | POA: Diagnosis not present

## 2015-11-30 DIAGNOSIS — M25551 Pain in right hip: Secondary | ICD-10-CM | POA: Diagnosis not present

## 2015-11-30 DIAGNOSIS — M25552 Pain in left hip: Secondary | ICD-10-CM | POA: Diagnosis not present

## 2015-12-05 ENCOUNTER — Telehealth (HOSPITAL_COMMUNITY): Payer: Self-pay | Admitting: *Deleted

## 2015-12-05 DIAGNOSIS — I10 Essential (primary) hypertension: Secondary | ICD-10-CM | POA: Diagnosis not present

## 2015-12-05 DIAGNOSIS — E875 Hyperkalemia: Secondary | ICD-10-CM | POA: Diagnosis not present

## 2015-12-05 DIAGNOSIS — N183 Chronic kidney disease, stage 3 (moderate): Secondary | ICD-10-CM | POA: Diagnosis not present

## 2015-12-05 NOTE — Telephone Encounter (Signed)
Left message on voicemail per DPR in reference to upcoming appointment scheduled on 12/07/15 at 1000 with detailed instructions given per Myocardial Perfusion Study Information Sheet for the test. LM to arrive 15 minutes early, and that it is imperative to arrive on time for appointment to keep from having the test rescheduled. If you need to cancel or reschedule your appointment, please call the office within 24 hours of your appointment. Failure to do so may result in a cancellation of your appointment, and a $50 no show fee. Phone number given for call back for any questions.

## 2015-12-06 ENCOUNTER — Ambulatory Visit (INDEPENDENT_AMBULATORY_CARE_PROVIDER_SITE_OTHER): Payer: Medicare Other | Admitting: Osteopathic Medicine

## 2015-12-06 ENCOUNTER — Encounter: Payer: Self-pay | Admitting: Osteopathic Medicine

## 2015-12-06 VITALS — BP 127/84 | HR 92 | Ht 66.0 in | Wt 142.0 lb

## 2015-12-06 DIAGNOSIS — E875 Hyperkalemia: Secondary | ICD-10-CM

## 2015-12-06 DIAGNOSIS — R630 Anorexia: Secondary | ICD-10-CM

## 2015-12-06 DIAGNOSIS — E1159 Type 2 diabetes mellitus with other circulatory complications: Secondary | ICD-10-CM

## 2015-12-06 DIAGNOSIS — Z23 Encounter for immunization: Secondary | ICD-10-CM

## 2015-12-06 DIAGNOSIS — N183 Chronic kidney disease, stage 3 unspecified: Secondary | ICD-10-CM

## 2015-12-06 DIAGNOSIS — Z0000 Encounter for general adult medical examination without abnormal findings: Secondary | ICD-10-CM | POA: Diagnosis not present

## 2015-12-06 DIAGNOSIS — R5383 Other fatigue: Secondary | ICD-10-CM

## 2015-12-06 DIAGNOSIS — I10 Essential (primary) hypertension: Secondary | ICD-10-CM | POA: Diagnosis not present

## 2015-12-06 LAB — POCT GLYCOSYLATED HEMOGLOBIN (HGB A1C): HEMOGLOBIN A1C: 6.1

## 2015-12-06 MED ORDER — OMEPRAZOLE 20 MG PO CPDR: 20.0000 mg | DELAYED_RELEASE_CAPSULE | Freq: Two times a day (BID) | ORAL | 11 refills | Status: DC

## 2015-12-06 NOTE — Progress Notes (Signed)
Subjective:    Tyler ROTHMEYER Sr. is a 75 y.o. male who presents for Medicare Annual/Subsequent preventive examination.   Preventive Screening-Counseling & Management  Tobacco History  Smoking Status  . Current Every Day Smoker  . Packs/day: 1.00  . Years: 65.00  . Types: Cigarettes  . Start date: 02/14/1948  Smokeless Tobacco  . Never Used    Comment: Peak rate of 3ppd    Problems Prior to Visit 1. See below  Current Problems (verified) Patient Active Problem List   Diagnosis Date Noted  . H/O asbestos exposure 10/31/2015  . Abdominal pain, generalized   . Abnormal CT scan   . Loss of weight   . Chronic kidney disease 08/30/2015  . Lung nodule < 6cm on CT 08/29/2015  . Moderate protein-calorie malnutrition ([place]) 08/24/2015  . COPD with acute exacerbation ([place]) 08/24/2015  . Other secondary osteoarthritis of both shoulders 06/12/2015  . Rhinitis, allergic 06/12/2015  . COPD, severe ([place]) 05/10/2015  . Bradycardia 04/11/2015  . Insomnia 04/05/2015  . Risk for falls 04/05/2015  . Type 2 diabetes mellitus with circulatory disorder ([place]) 04/05/2015  . Onychomycosis 11/25/2014  . Tinea pedis 11/25/2014  . Acute low back pain 08/18/2013  . Chest pain, atypical 10/17/2012  . Chronic respiratory failure with hypoxia ([place]) 10/17/2012  . On home oxygen therapy 10/17/2012  . Hyperkalemia 10/17/2012  . Headache(784.0) 02/20/2012  . Weight loss 04/17/2011  . Bladder neck obstruction 01/17/2011  . Preventative health care 11/17/2010  . CONSTIPATION 06/12/2010  . FLATULENCE-GAS-BLOATING 06/12/2010  . SCIATICA, LEFT 05/31/2010  . PHIMOSIS 04/06/2009  . TOBACCO USER 01/17/2009  . DYSPHAGIA 07/25/2008  . Osteoarthritis 06/24/2008  . ANEMIA-IRON DEFICIENCY 05/20/2008  . Memory loss 05/18/2008  . DEGENERATIVE DISC DISEASE, LUMBAR SPINE 01/03/2008  . Anxiety state 06/24/2007  . DEPRESSION 06/24/2007  . BENIGN PROSTATIC HYPERTROPHY 06/24/2007  . HYPERSOMNIA 06/24/2007  .  FATIGUE 06/24/2007  . Urinary hesitancy 06/24/2007  . COLONIC POLYPS 06/12/2007  . Diabetes ([place]) 06/12/2007  . HYPERLIPIDEMIA 06/12/2007  . Essential hypertension 06/12/2007  . ANGINA PECTORIS 06/12/2007  . Coronary atherosclerosis 06/12/2007  . PERIPHERAL VASCULAR DISEASE 06/12/2007  . ESOPHAGEAL STRICTURE 06/12/2007  . GERD 06/12/2007  . DIVERTICULOSIS, COLON 06/12/2007  . HERNIATED DISC 06/12/2007  . SPINAL STENOSIS 06/12/2007  . Lumbago 06/12/2007    Medications Prior to Visit Current Outpatient Prescriptions on File Prior to Visit  Medication Sig Dispense Refill  . albuterol (VENTOLIN HFA) 108 (90 BASE) MCG/ACT inhaler Inhale 2 puffs into the lungs every 6 (six) hours as needed for wheezing or shortness of breath. 1 Inhaler 11  . ALPRAZolam (XANAX) 0.25 MG tablet take 1 tablet by mouth twice a day if needed for anxiety or sleep 45 tablet 0  . AMBULATORY NON FORMULARY MEDICATION 2.5 Liters home oxygen continuous    . Aspirin-Salicylamide-Caffeine (BC HEADACHE POWDER PO) Take 1 Package by mouth 4 (four) times daily as needed (headache or pain).    Marland Kitchen donepezil (ARICEPT) 10 MG tablet Take 1 tablet (10 mg total) by mouth at bedtime. 30 tablet   . DULoxetine (CYMBALTA) 30 MG capsule     . DULoxetine (CYMBALTA) 60 MG capsule Take 1 capsule (60 mg total) by mouth daily. 90 capsule 3  . fluticasone (FLONASE) 50 MCG/ACT nasal spray instill 2 sprays into each nostril once daily (Patient taking differently: instill 2 sprays into each nostril once daily as needed) 16 g 11  . furosemide (LASIX) 20 MG tablet take 1/2 tablet by mouth twice  a day 30 tablet 0  . HYDROcodone-acetaminophen (NORCO) 10-325 MG tablet take 1 tablet by mouth every 4 to 6 hours if needed for pain (NO MORE THAN 5 PER DAY)  0  . lamoTRIgine (LAMICTAL) 25 MG tablet take 1 tablet at bedtime for 14 days then 1 tablet twice a day  0  . Multiple Vitamin (MULTIVITAMIN) capsule Take 1 capsule by mouth daily.     . nitroGLYCERIN  (NITROSTAT) 0.4 MG SL tablet Place 1 tablet (0.4 mg total) under the tongue every 5 (five) minutes as needed. For chest pain 25 tablet 6  . omeprazole (PRILOSEC) 20 MG capsule take 1 capsule by mouth twice a day before meals 60 capsule 11  . pravastatin (PRAVACHOL) 80 MG tablet take 1 tablet by mouth once daily 90 tablet 3  . terbinafine (LAMISIL AT) 1 % cream Apply 1 application topically 2 (two) times daily. 30 g 0  . umeclidinium bromide (INCRUSE ELLIPTA) 62.5 MCG/INH AEPB Inhale 1 puff into the lungs daily. 7 each 3   No current facility-administered medications on file prior to visit.     Allergies (verified) Celecoxib; Chicken protein; Fish-derived products; Morphine; Nalbuphine; Prednisone; and Venlafaxine   PAST HISTORY  Family History Family History  Problem Relation Age of Onset  . Stroke Father   . Emphysema Father   . Heart disease    . Hypertension    . Hyperlipidemia    . Allergies    . Diabetes    . Kidney cancer Daughter   . Breast cancer Daughter   . Colon cancer Neg Hx     Social History Social History  Substance Use Topics  . Smoking status: Current Every Day Smoker    Packs/day: 1.00    Years: 65.00    Types: Cigarettes    Start date: 02/14/1948  . Smokeless tobacco: Never Used     Comment: Peak rate of 3ppd  . Alcohol use No     Comment: none for 20 years     Are there smokers in your home (other than you)?  Yes  Risk Factors Current exercise habits: The patient does not participate in regular exercise at present.  Dietary issues discussed: yes   Cardiac risk factors: advanced age (older than 40 for men, 8 for women), dyslipidemia, hypertension, male gender, sedentary lifestyle and smoking/ tobacco exposure.  Depression Screen (Note: if answer to either of the following is "Yes", a more complete depression screening is indicated)   Q1: Over the past two weeks, have you felt down, depressed or hopeless? No  Q2: Over the past two weeks, have  you felt little interest or pleasure in doing things? Yes  Have you lost interest or pleasure in daily life? Yes  Do you often feel hopeless? No  Do you cry easily over simple problems? No  Activities of Daily Living In your present state of health, do you have any difficulty performing the following activities?:  Driving? Yes Managing money?  No Feeding yourself? No Getting from bed to chair? Yes Climbing a flight of stairs? Yes Preparing food and eating?: No Bathing or showering? Yes Getting dressed: Yes Getting to the toilet? No Using the toilet:No Moving around from place to place: Yes In the past year have you fallen or had a near fall?:No   Are you sexually active?  No  Do you have more than one partner?  No  Hearing Difficulties: Yes Do you often ask people to speak up or repeat themselves?  Yes Do you experience ringing or noises in your ears? Yes Do you have difficulty understanding soft or whispered voices? Yes   Do you feel that you have a problem with memory? Yes  Do you often misplace items? No  Do you feel safe at home?  No  Cognitive Testing  Alert? Yes  Normal Appearance?Yes  Oriented to person? Yes  Place? Yes   Time? Yes  Recall of three objects?  No  Can perform simple calculations? Yes  Displays appropriate judgment?Yes  Can read the correct time from a watch face?Yes   Advanced Directives have been discussed with the patient? Yes   List the Names of Other Physician/Practitioners you currently use: 1. Dr. Tamera Punt - ortho surgery 2. Dr. Zigmund Daniel  3. Dr. [name] - Cardiology - stress test tomorrow  4. Dr. Ashok Cordia - Pulmonology 5.  Dr. Henrene Pastor - GI 6. Dr. Dianah Field - sports med 7. Dr. Rosana Hoes - Ophthalmology 8. Dr. Ruthann Cancer - Nephrology   Indicate any recent Medical Services you may have received from other than [facility] providers in the past year (date may be approximate). Patient lists none - records briefly reviewed.   Immunization History   Administered Date(s) Administered  . H1N1 03/14/2008  . Influenza Split 02/06/2011, 12/18/2011  . Influenza Whole 01/07/2008, 01/04/2010  . Influenza,inj,Quad PF,36+ Mos 12/03/2013  . Influenza-Unspecified 12/01/2014  . Pneumococcal Conjugate-13 02/18/2013, 12/01/2014  . Pneumococcal Polysaccharide-23 04/09/2007  . Td 05/18/2008     Screening Tests Health Maintenance  Topic Date Due  . ZOSTAVAX  02/14/2003  . INFLUENZA VACCINE  11/07/2015  . FOOT EXAM  11/07/2015  . OPHTHALMOLOGY EXAM  11/07/2015  . PNA vac Low Risk Adult (2 of 2 - PPSV23) 12/01/2015  . HEMOGLOBIN A1C  02/23/2016  . URINE MICROALBUMIN  04/03/2016  . TETANUS/TDAP  05/18/2018  . COLONOSCOPY  10/12/2025    All answers were reviewed with the patient and necessary referrals were made:  [name], DO   12/06/2015   History reviewed: allergies, current medications, past family history, past medical history, past social history, past surgical history and problem list  Review of Systems Pertinent items are noted in HPI.    Objective:     Vision by Snellen chart: right eye:see nurse notes, left eye:see nurse notes Blood pressure 127/84, pulse 92, height 5\' 6"  (1.676 m), weight 142 lb (64.4 kg), SpO2 92 %. Body mass index is 22.92 kg/m.  General appearance: alert, cooperative, appears stated age and no distress Head: Normocephalic, without obvious abnormality, atraumatic Neck: no adenopathy, no carotid bruit, no JVD, supple, symmetrical, trachea midline and thyroid not enlarged, symmetric, no tenderness/mass/nodules Back: significant thoracic kyphosis Lungs: wheezes bilaterally Chest wall: no tenderness Heart: regular rate and rhythm, S1, S2 normal, no murmur, click, rub or gallop     Assessment:   Medicare annual wellness visit, subsequent  Other fatigue - Mild Anemia, severe COPD, deconditioning.   Need for prophylactic vaccination and inoculation against influenza - Plan: Flu Vaccine QUAD  36+ mos IM  Type 2 diabetes mellitus with other circulatory complication ([place]) - update A1C while here today in office - Plan: POCT HgB A1C  Hyperkalemia - following with nephro - dropped off urine sample today at their office, haven't heard back about instructions  - Plan: Amb ref to Medical Nutrition Therapy-MNT  Chronic kidney disease, stage 3 (moderate) - Plan: Amb ref to Medical Nutrition Therapy-MNT  Decreased appetite - Plan: Amb ref to Medical Nutrition Therapy-MNT    Plan:  During the course of the visit the patient was educated and counseled about appropriate screening and preventive services including:    Pneumococcal vaccine   Influenza vaccine  Td vaccine  Colorectal cancer screening  Nutrition counseling   Smoking cessation counseling  Advanced directives: has an advanced directive - a copy HAS NOT been provided.  Diet review for nutrition referral? Yes __X__  Not Indicated ____   Patient Instructions (the written plan) was given to the patient.  Patient Instructions  Plan: 1. Let us know if you don't hear back from your nephrologist by tomorrow about test results and adjusting your medications to help the potassium levels  2. Recommend 1 - 2 Boost or Ensure drinks daily to help with energy levels and nutrition.  3. Avoid sedating medications: pain medicines and alprazolam 4. If you are seriously weak, especially with chest pain or trouble breathing, go to an emergency room right away!  5. Stress testing should tell us more about your energy levels in terms of your heart health, but your poor lung function is a big concern. Your oxygen levels are okay now, if you feel short-winded at home use your oxygen and call us or your pulmonologist, and be sure you are using your inhalers as directed.  6. Bring your advance directives to your next visit so we can make copies for your chart.  7. Your diabetes is basically not an issue - eat whatever you want, your  A1C is concerning that your sugars may be running a bit on the low side.      Medicare Attestation I have personally reviewed: The patient's medical and social history Their use of alcohol, tobacco or illicit drugs Their current medications and supplements The patient's functional ability including ADLs,fall risks, home safety risks, cognitive, and hearing and visual impairment Diet and physical activities Evidence for depression or mood disorders  The patient's weight, height, BMI, and visual acuity have been recorded in the chart.  I have made referrals, counseling, and provided education to the patient based on review of the above and I have provided the patient with a written personalized care plan for preventive services.     [name], DO   12/06/2015

## 2015-12-06 NOTE — Patient Instructions (Addendum)
Plan: 1. Let us know if you don't hear back from your nephrologist by tomorrow about test results and adjusting your medications to help the potassium levels  2. Recommend 1 - 2 Boost or Ensure drinks daily to help with energy levels and nutrition.  3. Avoid sedating medications: pain medicines and alprazolam 4. If you are seriously weak, especially with chest pain or trouble breathing, go to an emergency room right away!  5. Stress testing should tell us more about your energy levels in terms of your heart health, but your poor lung function is a big concern. Your oxygen levels are okay now, if you feel short-winded at home use your oxygen and call us or your pulmonologist, and be sure you are using your inhalers as directed.  6. Bring your advance directives to your next visit so we can make copies for your chart.  7. Your diabetes is basically not an issue - eat whatever you want, your A1C is concerning that your sugars may be running a bit on the low side.

## 2015-12-07 ENCOUNTER — Ambulatory Visit (HOSPITAL_COMMUNITY): Payer: Medicare Other | Attending: Cardiology

## 2015-12-07 DIAGNOSIS — I10 Essential (primary) hypertension: Secondary | ICD-10-CM | POA: Diagnosis not present

## 2015-12-07 DIAGNOSIS — E119 Type 2 diabetes mellitus without complications: Secondary | ICD-10-CM | POA: Diagnosis not present

## 2015-12-07 DIAGNOSIS — I2510 Atherosclerotic heart disease of native coronary artery without angina pectoris: Secondary | ICD-10-CM | POA: Diagnosis not present

## 2015-12-07 DIAGNOSIS — Z01810 Encounter for preprocedural cardiovascular examination: Secondary | ICD-10-CM

## 2015-12-07 DIAGNOSIS — R0609 Other forms of dyspnea: Secondary | ICD-10-CM | POA: Diagnosis not present

## 2015-12-07 LAB — MYOCARDIAL PERFUSION IMAGING
CHL CUP NUCLEAR SRS: 4
CHL CUP NUCLEAR SSS: 4
CSEPPHR: 86 {beats}/min
LV sys vol: 46 mL
LVDIAVOL: 106 mL (ref 62–150)
NUC STRESS TID: 0.98
RATE: 0.28
Rest HR: 60 {beats}/min
SDS: 0

## 2015-12-07 MED ORDER — TECHNETIUM TC 99M TETROFOSMIN IV KIT
10.2000 | PACK | Freq: Once | INTRAVENOUS | Status: AC | PRN
  Administered 2015-12-07: 10 via INTRAVENOUS
  Filled 2015-12-07: qty 10

## 2015-12-07 MED ORDER — REGADENOSON 0.4 MG/5ML IV SOLN
0.4000 mg | Freq: Once | INTRAVENOUS | Status: AC
  Administered 2015-12-07: 0.4 mg via INTRAVENOUS

## 2015-12-07 MED ORDER — TECHNETIUM TC 99M TETROFOSMIN IV KIT
32.7000 | PACK | Freq: Once | INTRAVENOUS | Status: AC | PRN
  Administered 2015-12-07: 32.7 via INTRAVENOUS
  Filled 2015-12-07: qty 33

## 2015-12-08 ENCOUNTER — Encounter: Payer: Self-pay | Admitting: Cardiovascular Disease

## 2015-12-13 ENCOUNTER — Other Ambulatory Visit: Payer: Self-pay | Admitting: Osteopathic Medicine

## 2015-12-13 DIAGNOSIS — E875 Hyperkalemia: Secondary | ICD-10-CM

## 2015-12-13 DIAGNOSIS — N183 Chronic kidney disease, stage 3 unspecified: Secondary | ICD-10-CM

## 2015-12-13 NOTE — Progress Notes (Signed)
Please call patient: I got Dr Marcheta Grammes request for the lab order. He can come down and have this drawn and results should be sent to me and to Dr Ruthann Cancer. Thanks .

## 2015-12-14 ENCOUNTER — Other Ambulatory Visit: Payer: Self-pay | Admitting: *Deleted

## 2015-12-14 DIAGNOSIS — E875 Hyperkalemia: Secondary | ICD-10-CM

## 2015-12-14 DIAGNOSIS — N183 Chronic kidney disease, stage 3 unspecified: Secondary | ICD-10-CM

## 2015-12-16 LAB — BASIC METABOLIC PANEL WITH GFR
BUN: 18 mg/dL (ref 7–25)
CALCIUM: 9.1 mg/dL (ref 8.6–10.3)
CO2: 27 mmol/L (ref 20–31)
CREATININE: 1.42 mg/dL — AB (ref 0.70–1.18)
Chloride: 104 mmol/L (ref 98–110)
GFR, EST AFRICAN AMERICAN: 57 mL/min — AB (ref >=60)
GFR, EST NON AFRICAN AMERICAN: 49 mL/min — AB (ref >=60)
GLUCOSE: 203 mg/dL — AB (ref 65–99)
Potassium: 4.9 mmol/L (ref 3.5–5.3)
Sodium: 141 mmol/L (ref 135–146)

## 2015-12-20 ENCOUNTER — Other Ambulatory Visit: Payer: Self-pay | Admitting: Osteopathic Medicine

## 2015-12-28 ENCOUNTER — Inpatient Hospital Stay (HOSPITAL_COMMUNITY): Payer: Medicare Other

## 2015-12-28 ENCOUNTER — Inpatient Hospital Stay (HOSPITAL_COMMUNITY)
Admission: EM | Admit: 2015-12-28 | Discharge: 2015-12-29 | DRG: 071 | Disposition: A | Discharge status: Home / Self Care | Payer: Medicare Other | Attending: Internal Medicine | Admitting: Internal Medicine

## 2015-12-28 ENCOUNTER — Telehealth: Payer: Self-pay | Admitting: *Deleted

## 2015-12-28 ENCOUNTER — Emergency Department (HOSPITAL_COMMUNITY): Payer: Medicare Other

## 2015-12-28 ENCOUNTER — Encounter (HOSPITAL_COMMUNITY): Payer: Self-pay | Admitting: Emergency Medicine

## 2015-12-28 DIAGNOSIS — N183 Chronic kidney disease, stage 3 (moderate): Secondary | ICD-10-CM | POA: Diagnosis not present

## 2015-12-28 DIAGNOSIS — R4182 Altered mental status, unspecified: Secondary | ICD-10-CM | POA: Diagnosis not present

## 2015-12-28 DIAGNOSIS — I2510 Atherosclerotic heart disease of native coronary artery without angina pectoris: Secondary | ICD-10-CM | POA: Diagnosis present

## 2015-12-28 DIAGNOSIS — N400 Enlarged prostate without lower urinary tract symptoms: Secondary | ICD-10-CM | POA: Diagnosis present

## 2015-12-28 DIAGNOSIS — W19XXXA Unspecified fall, initial encounter: Secondary | ICD-10-CM | POA: Diagnosis present

## 2015-12-28 DIAGNOSIS — F17200 Nicotine dependence, unspecified, uncomplicated: Secondary | ICD-10-CM | POA: Diagnosis present

## 2015-12-28 DIAGNOSIS — F17210 Nicotine dependence, cigarettes, uncomplicated: Secondary | ICD-10-CM | POA: Diagnosis present

## 2015-12-28 DIAGNOSIS — N179 Acute kidney failure, unspecified: Secondary | ICD-10-CM | POA: Diagnosis present

## 2015-12-28 DIAGNOSIS — E1159 Type 2 diabetes mellitus with other circulatory complications: Secondary | ICD-10-CM | POA: Diagnosis present

## 2015-12-28 DIAGNOSIS — I129 Hypertensive chronic kidney disease with stage 1 through stage 4 chronic kidney disease, or unspecified chronic kidney disease: Secondary | ICD-10-CM | POA: Diagnosis present

## 2015-12-28 DIAGNOSIS — Z951 Presence of aortocoronary bypass graft: Secondary | ICD-10-CM

## 2015-12-28 DIAGNOSIS — R001 Bradycardia, unspecified: Secondary | ICD-10-CM | POA: Diagnosis present

## 2015-12-28 DIAGNOSIS — Z86010 Personal history of colonic polyps: Secondary | ICD-10-CM

## 2015-12-28 DIAGNOSIS — G9340 Encephalopathy, unspecified: Principal | ICD-10-CM | POA: Diagnosis present

## 2015-12-28 DIAGNOSIS — Z91013 Allergy to seafood: Secondary | ICD-10-CM

## 2015-12-28 DIAGNOSIS — Z885 Allergy status to narcotic agent status: Secondary | ICD-10-CM

## 2015-12-28 DIAGNOSIS — G8929 Other chronic pain: Secondary | ICD-10-CM | POA: Diagnosis present

## 2015-12-28 DIAGNOSIS — Z79899 Other long term (current) drug therapy: Secondary | ICD-10-CM

## 2015-12-28 DIAGNOSIS — Z96653 Presence of artificial knee joint, bilateral: Secondary | ICD-10-CM | POA: Diagnosis present

## 2015-12-28 DIAGNOSIS — R269 Unspecified abnormalities of gait and mobility: Secondary | ICD-10-CM

## 2015-12-28 DIAGNOSIS — R911 Solitary pulmonary nodule: Secondary | ICD-10-CM | POA: Diagnosis present

## 2015-12-28 DIAGNOSIS — Z77090 Contact with and (suspected) exposure to asbestos: Secondary | ICD-10-CM | POA: Diagnosis present

## 2015-12-28 DIAGNOSIS — Z8249 Family history of ischemic heart disease and other diseases of the circulatory system: Secondary | ICD-10-CM

## 2015-12-28 DIAGNOSIS — R0602 Shortness of breath: Secondary | ICD-10-CM | POA: Diagnosis not present

## 2015-12-28 DIAGNOSIS — G4733 Obstructive sleep apnea (adult) (pediatric): Secondary | ICD-10-CM | POA: Diagnosis present

## 2015-12-28 DIAGNOSIS — J984 Other disorders of lung: Secondary | ICD-10-CM | POA: Diagnosis not present

## 2015-12-28 DIAGNOSIS — F0391 Unspecified dementia with behavioral disturbance: Secondary | ICD-10-CM

## 2015-12-28 DIAGNOSIS — E872 Acidosis: Secondary | ICD-10-CM | POA: Diagnosis present

## 2015-12-28 DIAGNOSIS — I252 Old myocardial infarction: Secondary | ICD-10-CM | POA: Diagnosis not present

## 2015-12-28 DIAGNOSIS — I10 Essential (primary) hypertension: Secondary | ICD-10-CM | POA: Diagnosis present

## 2015-12-28 DIAGNOSIS — F0390 Unspecified dementia without behavioral disturbance: Secondary | ICD-10-CM | POA: Diagnosis present

## 2015-12-28 DIAGNOSIS — K5900 Constipation, unspecified: Secondary | ICD-10-CM | POA: Diagnosis present

## 2015-12-28 DIAGNOSIS — Z91018 Allergy to other foods: Secondary | ICD-10-CM

## 2015-12-28 DIAGNOSIS — G0430 Acute necrotizing hemorrhagic encephalopathy, unspecified: Secondary | ICD-10-CM | POA: Diagnosis not present

## 2015-12-28 DIAGNOSIS — R402431 Glasgow coma scale score 3-8, in the field [EMT or ambulance]: Secondary | ICD-10-CM | POA: Diagnosis not present

## 2015-12-28 DIAGNOSIS — E785 Hyperlipidemia, unspecified: Secondary | ICD-10-CM | POA: Diagnosis present

## 2015-12-28 DIAGNOSIS — J449 Chronic obstructive pulmonary disease, unspecified: Secondary | ICD-10-CM | POA: Diagnosis present

## 2015-12-28 DIAGNOSIS — K219 Gastro-esophageal reflux disease without esophagitis: Secondary | ICD-10-CM | POA: Diagnosis present

## 2015-12-28 DIAGNOSIS — Z823 Family history of stroke: Secondary | ICD-10-CM

## 2015-12-28 DIAGNOSIS — F419 Anxiety disorder, unspecified: Secondary | ICD-10-CM | POA: Diagnosis present

## 2015-12-28 DIAGNOSIS — N182 Chronic kidney disease, stage 2 (mild): Secondary | ICD-10-CM | POA: Diagnosis present

## 2015-12-28 DIAGNOSIS — E118 Type 2 diabetes mellitus with unspecified complications: Secondary | ICD-10-CM

## 2015-12-28 DIAGNOSIS — D649 Anemia, unspecified: Secondary | ICD-10-CM | POA: Diagnosis present

## 2015-12-28 DIAGNOSIS — E1151 Type 2 diabetes mellitus with diabetic peripheral angiopathy without gangrene: Secondary | ICD-10-CM | POA: Diagnosis present

## 2015-12-28 DIAGNOSIS — J9611 Chronic respiratory failure with hypoxia: Secondary | ICD-10-CM | POA: Diagnosis present

## 2015-12-28 DIAGNOSIS — Z955 Presence of coronary angioplasty implant and graft: Secondary | ICD-10-CM

## 2015-12-28 DIAGNOSIS — S199XXA Unspecified injury of neck, initial encounter: Secondary | ICD-10-CM | POA: Diagnosis not present

## 2015-12-28 DIAGNOSIS — Z7982 Long term (current) use of aspirin: Secondary | ICD-10-CM

## 2015-12-28 DIAGNOSIS — Z881 Allergy status to other antibiotic agents status: Secondary | ICD-10-CM

## 2015-12-28 DIAGNOSIS — Z833 Family history of diabetes mellitus: Secondary | ICD-10-CM

## 2015-12-28 DIAGNOSIS — Z9981 Dependence on supplemental oxygen: Secondary | ICD-10-CM

## 2015-12-28 DIAGNOSIS — F329 Major depressive disorder, single episode, unspecified: Secondary | ICD-10-CM | POA: Diagnosis present

## 2015-12-28 DIAGNOSIS — N189 Chronic kidney disease, unspecified: Secondary | ICD-10-CM | POA: Diagnosis present

## 2015-12-28 DIAGNOSIS — IMO0001 Reserved for inherently not codable concepts without codable children: Secondary | ICD-10-CM | POA: Diagnosis present

## 2015-12-28 DIAGNOSIS — H9190 Unspecified hearing loss, unspecified ear: Secondary | ICD-10-CM | POA: Diagnosis present

## 2015-12-28 DIAGNOSIS — Z888 Allergy status to other drugs, medicaments and biological substances status: Secondary | ICD-10-CM

## 2015-12-28 HISTORY — DX: Chronic kidney disease, unspecified: N18.9

## 2015-12-28 HISTORY — DX: Unspecified dementia, unspecified severity, without behavioral disturbance, psychotic disturbance, mood disturbance, and anxiety: F03.90

## 2015-12-28 HISTORY — DX: Encephalopathy, unspecified: G93.40

## 2015-12-28 LAB — COMPREHENSIVE METABOLIC PANEL
ALBUMIN: 3.1 g/dL — AB (ref 3.5–5.0)
ALK PHOS: 82 U/L (ref 38–126)
ALT: 14 U/L — ABNORMAL LOW (ref 17–63)
ANION GAP: 11 (ref 5–15)
AST: 27 U/L (ref 15–41)
BILIRUBIN TOTAL: 0.5 mg/dL (ref 0.3–1.2)
BUN: 40 mg/dL — AB (ref 6–20)
CO2: 16 mmol/L — AB (ref 22–32)
Calcium: 8.4 mg/dL — ABNORMAL LOW (ref 8.9–10.3)
Chloride: 113 mmol/L — ABNORMAL HIGH (ref 101–111)
Creatinine, Ser: 1.82 mg/dL — ABNORMAL HIGH (ref 0.61–1.24)
GFR calc Af Amer: 41 mL/min — ABNORMAL LOW (ref >60)
GFR calc non Af Amer: 35 mL/min — ABNORMAL LOW (ref >60)
GLUCOSE: 122 mg/dL — AB (ref 65–99)
POTASSIUM: 4 mmol/L (ref 3.5–5.1)
SODIUM: 140 mmol/L (ref 135–145)
TOTAL PROTEIN: 6.6 g/dL (ref 6.5–8.1)

## 2015-12-28 LAB — CBG MONITORING, ED
Glucose-Capillary: 116 mg/dL — ABNORMAL HIGH (ref 65–99)
Glucose-Capillary: 93 mg/dL (ref 65–99)

## 2015-12-28 LAB — CBC WITH DIFFERENTIAL/PLATELET
BASOS ABS: 0 10*3/uL (ref 0.0–0.1)
BASOS PCT: 1 %
EOS ABS: 0.2 10*3/uL (ref 0.0–0.7)
Eosinophils Relative: 3 %
HEMATOCRIT: 32.9 % — AB (ref 39.0–52.0)
HEMOGLOBIN: 10.5 g/dL — AB (ref 13.0–17.0)
Lymphocytes Relative: 20 %
Lymphs Abs: 1.5 10*3/uL (ref 0.7–4.0)
MCH: 29.6 pg (ref 26.0–34.0)
MCHC: 31.9 g/dL (ref 30.0–36.0)
MCV: 92.7 fL (ref 78.0–100.0)
Monocytes Absolute: 0.7 10*3/uL (ref 0.1–1.0)
Monocytes Relative: 9 %
Neutro Abs: 5.2 10*3/uL (ref 1.7–7.7)
Neutrophils Relative %: 69 %
Platelets: 314 10*3/uL (ref 150–400)
RBC: 3.55 MIL/uL — ABNORMAL LOW (ref 4.22–5.81)
RDW: 16.5 % — AB (ref 11.5–15.5)
WBC: 7.6 10*3/uL (ref 4.0–10.5)

## 2015-12-28 LAB — URINALYSIS, ROUTINE W REFLEX MICROSCOPIC
Bilirubin Urine: NEGATIVE
Glucose, UA: NEGATIVE mg/dL
Hgb urine dipstick: NEGATIVE
Ketones, ur: NEGATIVE mg/dL
Leukocytes, UA: NEGATIVE
NITRITE: NEGATIVE
PH: 5 (ref 5.0–8.0)
Protein, ur: NEGATIVE mg/dL
SPECIFIC GRAVITY, URINE: 1.016 (ref 1.005–1.030)

## 2015-12-28 LAB — ECHOCARDIOGRAM COMPLETE
Height: 71 in
Weight: 2400 [oz_av]

## 2015-12-28 LAB — RAPID URINE DRUG SCREEN, HOSP PERFORMED
Amphetamines: NOT DETECTED
BARBITURATES: NOT DETECTED
Benzodiazepines: POSITIVE — AB
COCAINE: NOT DETECTED
Opiates: POSITIVE — AB
TETRAHYDROCANNABINOL: NOT DETECTED

## 2015-12-28 LAB — I-STAT ARTERIAL BLOOD GAS, ED
Acid-base deficit: 9 mmol/L — ABNORMAL HIGH (ref 0.0–2.0)
Acid-base deficit: 6 mmol/L — ABNORMAL HIGH (ref 0.0–2.0)
BICARBONATE: 19.3 mmol/L — AB (ref 20.0–28.0)
Bicarbonate: 16.1 mmol/L — ABNORMAL LOW (ref 20.0–28.0)
O2 SAT: 96 %
O2 Saturation: 98 %
PCO2 ART: 32 mmHg (ref 32.0–48.0)
PCO2 ART: 36.4 mmHg (ref 32.0–48.0)
PH ART: 7.308 — AB (ref 7.350–7.450)
PO2 ART: 90 mmHg (ref 83.0–108.0)
Patient temperature: 98.6
TCO2: 17 mmol/L (ref 0–100)
TCO2: 20 mmol/L (ref 0–100)
pH, Arterial: 7.333 — ABNORMAL LOW (ref 7.350–7.450)
pO2, Arterial: 113 mmHg — ABNORMAL HIGH (ref 83.0–108.0)

## 2015-12-28 LAB — SALICYLATE LEVEL: SALICYLATE LVL: 24.3 mg/dL (ref 2.8–30.0)

## 2015-12-28 LAB — BASIC METABOLIC PANEL
Anion gap: 10 (ref 5–15)
BUN: 31 mg/dL — AB (ref 6–20)
CHLORIDE: 115 mmol/L — AB (ref 101–111)
CO2: 21 mmol/L — ABNORMAL LOW (ref 22–32)
Calcium: 8.7 mg/dL — ABNORMAL LOW (ref 8.9–10.3)
Creatinine, Ser: 1.48 mg/dL — ABNORMAL HIGH (ref 0.61–1.24)
GFR calc non Af Amer: 46 mL/min — ABNORMAL LOW (ref >60)
GFR, EST AFRICAN AMERICAN: 53 mL/min — AB (ref >60)
Glucose, Bld: 97 mg/dL (ref 65–99)
POTASSIUM: 4.3 mmol/L (ref 3.5–5.1)
Sodium: 146 mmol/L — ABNORMAL HIGH (ref 135–145)

## 2015-12-28 LAB — I-STAT CG4 LACTIC ACID, ED: Lactic Acid, Venous: 0.55 mmol/L (ref 0.5–1.9)

## 2015-12-28 LAB — ETHANOL

## 2015-12-28 LAB — ACETAMINOPHEN LEVEL

## 2015-12-28 LAB — I-STAT TROPONIN, ED: TROPONIN I, POC: 0.03 ng/mL (ref 0.00–0.08)

## 2015-12-28 LAB — GLUCOSE, CAPILLARY
GLUCOSE-CAPILLARY: 172 mg/dL — AB (ref 65–99)
Glucose-Capillary: 95 mg/dL (ref 65–99)

## 2015-12-28 LAB — PROCALCITONIN: Procalcitonin: <0.1 ng/mL

## 2015-12-28 LAB — BRAIN NATRIURETIC PEPTIDE: B Natriuretic Peptide: 69.8 pg/mL (ref 0.0–100.0)

## 2015-12-28 LAB — TROPONIN I: Troponin I: <0.03 ng/mL

## 2015-12-28 LAB — TSH: TSH: 0.701 u[IU]/mL (ref 0.350–4.500)

## 2015-12-28 LAB — AMMONIA: AMMONIA: 21 umol/L (ref 9–35)

## 2015-12-28 MED ORDER — ALBUTEROL SULFATE (2.5 MG/3ML) 0.083% IN NEBU: 2.5000 mg | INHALATION_SOLUTION | Freq: Four times a day (QID) | RESPIRATORY_TRACT | Status: DC | PRN

## 2015-12-28 MED ORDER — SODIUM CHLORIDE 0.9% FLUSH
3.0000 mL | Freq: Two times a day (BID) | INTRAVENOUS | Status: DC
  Administered 2015-12-28 (×2): 3 mL via INTRAVENOUS

## 2015-12-28 MED ORDER — DONEPEZIL HCL 10 MG PO TABS
10.0000 mg | ORAL_TABLET | Freq: Every day | ORAL | Status: DC
  Administered 2015-12-28: 10 mg via ORAL
  Filled 2015-12-28: qty 1

## 2015-12-28 MED ORDER — SODIUM BICARBONATE 8.4 % IV SOLN
100.0000 meq | Freq: Once | INTRAVENOUS | Status: AC
  Administered 2015-12-28: 100 meq via INTRAVENOUS
  Filled 2015-12-28: qty 100

## 2015-12-28 MED ORDER — HEPARIN SODIUM (PORCINE) 5000 UNIT/ML IJ SOLN
5000.0000 [IU] | Freq: Three times a day (TID) | INTRAMUSCULAR | Status: DC
  Administered 2015-12-28 – 2015-12-29 (×3): 5000 [IU] via SUBCUTANEOUS
  Filled 2015-12-28 (×3): qty 1

## 2015-12-28 MED ORDER — SENNA 8.6 MG PO TABS
2.0000 | ORAL_TABLET | Freq: Every day | ORAL | Status: DC
  Administered 2015-12-28 – 2015-12-29 (×2): 17.2 mg via ORAL
  Filled 2015-12-28 (×3): qty 2

## 2015-12-28 MED ORDER — SODIUM CHLORIDE 0.9 % IV SOLN
INTRAVENOUS | Status: DC
  Administered 2015-12-28 – 2015-12-29 (×3): via INTRAVENOUS

## 2015-12-28 MED ORDER — HYDROCODONE-ACETAMINOPHEN 10-325 MG PO TABS
1.0000 | ORAL_TABLET | Freq: Four times a day (QID) | ORAL | Status: DC | PRN
  Administered 2015-12-28 – 2015-12-29 (×4): 1 via ORAL
  Filled 2015-12-28 (×4): qty 1

## 2015-12-28 MED ORDER — SODIUM BICARBONATE 8.4 % IV SOLN
50.0000 meq | Freq: Once | INTRAVENOUS | Status: AC
  Administered 2015-12-28: 50 meq via INTRAVENOUS
  Filled 2015-12-28: qty 50

## 2015-12-28 MED ORDER — DULOXETINE HCL 30 MG PO CPEP
90.0000 mg | ORAL_CAPSULE | Freq: Every day | ORAL | Status: DC
  Administered 2015-12-28 – 2015-12-29 (×2): 90 mg via ORAL
  Filled 2015-12-28 (×2): qty 3

## 2015-12-28 MED ORDER — LAMOTRIGINE 100 MG PO TABS
100.0000 mg | ORAL_TABLET | Freq: Every day | ORAL | Status: DC
  Administered 2015-12-28: 100 mg via ORAL
  Filled 2015-12-28: qty 1

## 2015-12-28 MED ORDER — NALOXONE HCL 0.4 MG/ML IJ SOLN: 0.4000 mg | INTRAMUSCULAR | Status: DC | PRN

## 2015-12-28 MED ORDER — PROMETHAZINE HCL 25 MG PO TABS: 12.5000 mg | ORAL_TABLET | Freq: Four times a day (QID) | ORAL | Status: DC | PRN

## 2015-12-28 MED ORDER — PANTOPRAZOLE SODIUM 40 MG PO TBEC
40.0000 mg | DELAYED_RELEASE_TABLET | Freq: Every day | ORAL | Status: DC
  Administered 2015-12-28 – 2015-12-29 (×2): 40 mg via ORAL
  Filled 2015-12-28 (×2): qty 1

## 2015-12-28 MED ORDER — SODIUM CHLORIDE 0.9 % IV BOLUS (SEPSIS)
500.0000 mL | Freq: Once | INTRAVENOUS | Status: AC
  Administered 2015-12-28: 500 mL via INTRAVENOUS

## 2015-12-28 MED ORDER — PRAVASTATIN SODIUM 40 MG PO TABS
80.0000 mg | ORAL_TABLET | Freq: Every day | ORAL | Status: DC
  Administered 2015-12-28: 80 mg via ORAL
  Filled 2015-12-28: qty 2

## 2015-12-28 MED ORDER — INSULIN ASPART 100 UNIT/ML ~~LOC~~ SOLN: 0.0000 [IU] | Freq: Every day | SUBCUTANEOUS | Status: DC

## 2015-12-28 MED ORDER — UMECLIDINIUM BROMIDE 62.5 MCG/INH IN AEPB
1.0000 | INHALATION_SPRAY | Freq: Every day | RESPIRATORY_TRACT | Status: DC
  Administered 2015-12-29: 1 via RESPIRATORY_TRACT
  Filled 2015-12-28: qty 7

## 2015-12-28 MED ORDER — POLYETHYLENE GLYCOL 3350 17 G PO PACK
17.0000 g | PACK | Freq: Every day | ORAL | Status: DC
  Administered 2015-12-28 – 2015-12-29 (×2): 17 g via ORAL
  Filled 2015-12-28 (×2): qty 1

## 2015-12-28 MED ORDER — ALPRAZOLAM 0.25 MG PO TABS
0.2500 mg | ORAL_TABLET | Freq: Two times a day (BID) | ORAL | Status: DC | PRN
  Administered 2015-12-28: 0.25 mg via ORAL
  Filled 2015-12-28: qty 1

## 2015-12-28 MED ORDER — NICOTINE 21 MG/24HR TD PT24
21.0000 mg | MEDICATED_PATCH | Freq: Every day | TRANSDERMAL | Status: DC
  Administered 2015-12-28 – 2015-12-29 (×2): 21 mg via TRANSDERMAL
  Filled 2015-12-28 (×2): qty 1

## 2015-12-28 MED ORDER — LAMOTRIGINE 25 MG PO TABS: 25.0000 mg | ORAL_TABLET | Freq: Two times a day (BID) | ORAL | Status: DC

## 2015-12-28 MED ORDER — LAMOTRIGINE 25 MG PO TABS
50.0000 mg | ORAL_TABLET | Freq: Every day | ORAL | Status: DC
  Administered 2015-12-29: 50 mg via ORAL
  Filled 2015-12-28: qty 2

## 2015-12-28 MED ORDER — INSULIN ASPART 100 UNIT/ML ~~LOC~~ SOLN
0.0000 [IU] | Freq: Three times a day (TID) | SUBCUTANEOUS | Status: DC
  Administered 2015-12-29: 1 [IU] via SUBCUTANEOUS

## 2015-12-28 NOTE — Progress Notes (Deleted)
  Echocardiogram Echocardiogram Transesophageal has been performed.  Darlina Sicilian M 12/28/2015, 10:00 AM

## 2015-12-28 NOTE — ED Notes (Signed)
After returning from MRI the patient is more alert, sitting up and looking around, talking to his wife.  He was giving small sips of water per Dr [name].

## 2015-12-28 NOTE — Consult Note (Signed)
[name] Infectious Disease  Date of Admission:  12/28/2015  Date of Consult:  12/28/2015  Reason for Consult:  Abnormal Chest CT Referring Physician: Dr. [name]  Impression/Recommendation Acute encephalopathy (suspected narcotic toxicity) Intermittent bradycardia Possible MAC   Await HIV, RPR, Sputum Cx + gram stain, Flu pcr, legionella, strep pna AG Would check  AFB sputum cx  Comment- He has only one of the criteria for MAI (radiographic). He needs microbiologic confirmation- he needs at least 2 positive sputums, or a + BAL.  He can outpt f/u.  We will follow his labs while he is in hospital, I would not start him on therapy for MAI at this time. He has minimal sx.    (pager) [phone] [email or web address]  Tyler HAYMOND Sr. is an 75 y.o. male.   HPI: Patient presented to ED 9/21 with progressive worsening of lethargy and confusion for 2 days.  Wife reports she found patient sitting on the floor this morning after possible fall vs sliding out of bed.  He was unable to get up to his chronic bilateral shoulder pain and secondary to generalized weakness.  Wife had to call EMS.  On arrival to ER, patient was somnolent but able to answer some questions.  He was also found to be intermittently bradycardic in which cardiology was consulted for.  Workup in ED showed non-anion gap acidosis felt related his recent placement on lasix 2 months ago.  WBC and differential normal. Lactic acid 0.55  UA normal, Abnormal finding include Cr 1.82, BUN 40, glucose 122, chloride 113, Hgb 10.5, HCT 32.9.  UDS + for benzo's and opiates. Echo showed LVEFR 60-65%, mild LVH, AV sclerosis, mild TR, and RVSP  26 mmHg.  CXR showed emphysematous changes and bronchitic changes in the lungs with new opacification of right lung apex.  Head CT and MRI brain with no acute findings. CT of chest showed stable branching pattern in the left upper lobe and new branching nodular pattern in the right lower  lobe.  Findings are most consistent with chronic/recurrent/migratory infectious process and to consider mycobacterium avium intracellulare.  This pattern was noted back on his chest CT in 08/2015.  After arriving back from MRI, patient was found to be more alert and conversant with his wife.    The patient complains of constipation relieved after large firm bowel movement in ED.  Also complains of occasional hot/cold feelings in his feet and hot flash in his head.  Otherwise, he and his wife at the bedside deny any change or worsening symptoms of shortness of breath (not needing daytime supplemental oxygen), cough, no sputum production, fever, night sweats, or weight change.  Also denies any diarrhea, chest pain, recent travel, exposure to hot tubs, and no recent sick contacts.  Denies any exposure to birds.  They have one indoor cat for the last year.  He continues to smoke 1-2 ppd in which he as since the age of 68 years old per his wife.   Past Medical History:  Diagnosis Date  . Allergy   . Anxiety   . Asthma   . Benign prostatic hypertrophy   . CAD (coronary artery disease)    a. s/p multiple PCIs to RCA and eventual CABG in 1999;  b.LHC 11/2003: CFX 95%, RCA occluded, SVG-RCA occluded, SVG-OM patent, native LAD patent. EF was 55%.  Patient had left to right collaterals and medical therapy was recommended    . COPD (chronic obstructive pulmonary disease) ([place])   .  DDD (degenerative disc disease), lumbar   . Depression   . Diabetes mellitus    on no medications  . Diverticulitis, colon   . Emphysema of lung ([place])   . Esophageal stricture   . GERD (gastroesophageal reflux disease)   . Herniated disc   . Hiatal hernia   . History of colonic polyps   . HOH (hard of hearing)   . Hx of colonoscopy   . Hyperlipidemia   . Hypertension   . Insomnia   . Iron deficiency anemia   . Lumbar back pain    chronic  . Memory loss   . OA (osteoarthritis)   . OSA (obstructive sleep apnea)    wife  denies this  . Pneumonia   . PVD (peripheral vascular disease) ([place])   . Shortness of breath dyspnea    on home O2  . Spinal stenosis     Past Surgical History:  Procedure Laterality Date  . bilateral knee replacements    . CARPAL TUNNEL RELEASE    . COLONOSCOPY N/A 10/13/2015   Procedure: COLONOSCOPY;  Surgeon: Irene Shipper, MD;  Location: [facility] ENDOSCOPY;  Service: Endoscopy;  Laterality: N/A;  . CORONARY ANGIOPLASTY WITH STENT PLACEMENT  2/99  . CORONARY ARTERY BYPASS GRAFT  09/1997   double bypss  . heart bypass    . HEMORRHOID SURGERY    . SHOULDER ARTHROSCOPY  right  . STRABISMUS SURGERY  08/29/2011   Procedure: REPAIR STRABISMUS;  Surgeon: Derry Skill, MD;  Location: [facility];  Service: Ophthalmology;  Laterality: Left;     Allergies  Allergen Reactions  . Celecoxib Nausea And Vomiting    REACTION: GI upset  . Chicken Protein Other (See Comments)    Will not eat chicken  . Fish-Derived Products Other (See Comments)    Will not eat seafood of any kind  . Morphine Itching    REACTION: itching  . Nalbuphine Other (See Comments)    REACTION: contraindication with oxycontin.  . Prednisone Other (See Comments)    REACTION: "yeast infections"  . Venlafaxine Other (See Comments)    REACTION: GI upset    Medications: I have reviewed the patient's current medications.  Abtx:  Anti-infectives    None      Total days of antibiotics: none          Social History:  reports that he has been smoking Cigarettes.  He started smoking about 67 years ago. He has a 65.00 pack-year smoking history. He has never used smokeless tobacco. He reports that he does not drink alcohol or use drugs.  Family History  Problem Relation Age of Onset  . Stroke Father   . Emphysema Father   . Heart disease    . Hypertension    . Hyperlipidemia    . Allergies    . Diabetes    . Kidney cancer Daughter   . Breast cancer Daughter   . Colon cancer Neg Hx     Negative except constipation,  lethargy, and confusion.  See HPI  No hx of military service; he has been incarcerated (many years ago), no homelessness, no hx of TB exposure.    Blood pressure 125/76, pulse 89, temperature 97.6 F (36.4 C), temperature source Oral, resp. rate 21, height '5\' 11"'  (1.803 m), weight 68 kg (150 lb), SpO2 100 %. General appearance: alert, cooperative and no distress Eyes: conjunctivae/corneas clear. PERRL, EOM's intact. Fundi benign. Throat: lips, mucosa, and tongue normal; teeth and gums normal Lungs:  wheezes diffuse with prolonged expiration Heart: intermittently bradycardic Abdomen: soft, non-tender; bowel sounds normal; no masses,  no organomegaly Extremities: bilateral telangiectasia in both feet Pulses: pulses +1, cool hands and feet Neurologic: Mental status: Alert, oriented, thought content appropriate   Results for orders placed or performed during the hospital encounter of 12/28/15 (from the past 48 hour(s))  CBC with Differential/Platelet     Status: Abnormal   Collection Time: 12/28/15  5:55 AM  Result Value Ref Range   WBC 7.6 4.0 - 10.5 K/uL   RBC 3.55 (L) 4.22 - 5.81 MIL/uL   Hemoglobin 10.5 (L) 13.0 - 17.0 g/dL   HCT 32.9 (L) 39.0 - 52.0 %   MCV 92.7 78.0 - 100.0 fL   MCH 29.6 26.0 - 34.0 pg   MCHC 31.9 30.0 - 36.0 g/dL   RDW 16.5 (H) 11.5 - 15.5 %   Platelets 314 150 - 400 K/uL   Neutrophils Relative % 69 %   Neutro Abs 5.2 1.7 - 7.7 K/uL   Lymphocytes Relative 20 %   Lymphs Abs 1.5 0.7 - 4.0 K/uL   Monocytes Relative 9 %   Monocytes Absolute 0.7 0.1 - 1.0 K/uL   Eosinophils Relative 3 %   Eosinophils Absolute 0.2 0.0 - 0.7 K/uL   Basophils Relative 1 %   Basophils Absolute 0.0 0.0 - 0.1 K/uL  Comprehensive metabolic panel     Status: Abnormal   Collection Time: 12/28/15  5:55 AM  Result Value Ref Range   Sodium 140 135 - 145 mmol/L   Potassium 4.0 3.5 - 5.1 mmol/L   Chloride 113 (H) 101 - 111 mmol/L   CO2 16 (L) 22 - 32 mmol/L   Glucose, Bld 122 (H) 65 -  99 mg/dL   BUN 40 (H) 6 - 20 mg/dL   Creatinine, Ser 1.82 (H) 0.61 - 1.24 mg/dL   Calcium 8.4 (L) 8.9 - 10.3 mg/dL   Total Protein 6.6 6.5 - 8.1 g/dL   Albumin 3.1 (L) 3.5 - 5.0 g/dL   AST 27 15 - 41 U/L   ALT 14 (L) 17 - 63 U/L   Alkaline Phosphatase 82 38 - 126 U/L   Total Bilirubin 0.5 0.3 - 1.2 mg/dL   GFR calc non Af Amer 35 (L) >60 mL/min   GFR calc Af Amer 41 (L) >60 mL/min    Comment: (NOTE) The eGFR has been calculated using the CKD EPI equation. This calculation has not been validated in all clinical situations. eGFR's persistently <60 mL/min signify possible Chronic Kidney Disease.    Anion gap 11 5 - 15  Brain natriuretic peptide     Status: None   Collection Time: 12/28/15  5:55 AM  Result Value Ref Range   B Natriuretic Peptide 69.8 0.0 - 100.0 pg/mL  Ethanol     Status: None   Collection Time: 12/28/15  5:55 AM  Result Value Ref Range   Alcohol, Ethyl (B) <5 <5 mg/dL    Comment:        LOWEST DETECTABLE LIMIT FOR SERUM ALCOHOL IS 5 mg/dL FOR MEDICAL PURPOSES ONLY   Ammonia     Status: None   Collection Time: 12/28/15  5:55 AM  Result Value Ref Range   Ammonia 21 9 - 35 umol/L  CBG monitoring, ED     Status: Abnormal   Collection Time: 12/28/15  5:55 AM  Result Value Ref Range   Glucose-Capillary 116 (H) 65 - 99 mg/dL  I-stat troponin, ED  Status: None   Collection Time: 12/28/15  6:06 AM  Result Value Ref Range   Troponin i, poc 0.03 0.00 - 0.08 ng/mL   Comment 3            Comment: Due to the release kinetics of cTnI, a negative result within the first hours of the onset of symptoms does not rule out myocardial infarction with certainty. If myocardial infarction is still suspected, repeat the test at appropriate intervals.   I-Stat CG4 Lactic Acid, ED     Status: None   Collection Time: 12/28/15  6:08 AM  Result Value Ref Range   Lactic Acid, Venous 0.55 0.5 - 1.9 mmol/L  Salicylate level     Status: None   Collection Time: 12/28/15  6:51 AM   Result Value Ref Range   Salicylate Lvl 53.2 2.8 - 30.0 mg/dL  Acetaminophen level     Status: Abnormal   Collection Time: 12/28/15  6:51 AM  Result Value Ref Range   Acetaminophen (Tylenol), Serum <10 (L) 10 - 30 ug/mL    Comment:        THERAPEUTIC CONCENTRATIONS VARY SIGNIFICANTLY. A RANGE OF 10-30 ug/mL MAY BE AN EFFECTIVE CONCENTRATION FOR MANY PATIENTS. HOWEVER, SOME ARE BEST TREATED AT CONCENTRATIONS OUTSIDE THIS RANGE. ACETAMINOPHEN CONCENTRATIONS >150 ug/mL AT 4 HOURS AFTER INGESTION AND >50 ug/mL AT 12 HOURS AFTER INGESTION ARE OFTEN ASSOCIATED WITH TOXIC REACTIONS.   I-Stat arterial blood gas, ED     Status: Abnormal   Collection Time: 12/28/15  7:01 AM  Result Value Ref Range   pH, Arterial 7.308 (L) 7.350 - 7.450   pCO2 arterial 32.0 32.0 - 48.0 mmHg   pO2, Arterial 113.0 (H) 83.0 - 108.0 mmHg   Bicarbonate 16.1 (L) 20.0 - 28.0 mmol/L   TCO2 17 0 - 100 mmol/L   O2 Saturation 98.0 %   Acid-base deficit 9.0 (H) 0.0 - 2.0 mmol/L   Patient temperature 98.2 F    Collection site BRACHIAL ARTERY    Drawn by RT    Sample type ARTERIAL   Urinalysis, Routine w reflex microscopic (not at [facility])     Status: None   Collection Time: 12/28/15  7:21 AM  Result Value Ref Range   Color, Urine YELLOW YELLOW   APPearance CLEAR CLEAR   Specific Gravity, Urine 1.016 1.005 - 1.030   pH 5.0 5.0 - 8.0   Glucose, UA NEGATIVE NEGATIVE mg/dL   Hgb urine dipstick NEGATIVE NEGATIVE   Bilirubin Urine NEGATIVE NEGATIVE   Ketones, ur NEGATIVE NEGATIVE mg/dL   Protein, ur NEGATIVE NEGATIVE mg/dL   Nitrite NEGATIVE NEGATIVE   Leukocytes, UA NEGATIVE NEGATIVE    Comment: MICROSCOPIC NOT DONE ON URINES WITH NEGATIVE PROTEIN, BLOOD, LEUKOCYTES, NITRITE, OR GLUCOSE <1000 mg/dL.  Urine rapid drug screen (hosp performed)     Status: Abnormal   Collection Time: 12/28/15  7:21 AM  Result Value Ref Range   Opiates POSITIVE (A) NONE DETECTED   Cocaine NONE DETECTED NONE DETECTED    Benzodiazepines POSITIVE (A) NONE DETECTED   Amphetamines NONE DETECTED NONE DETECTED   Tetrahydrocannabinol NONE DETECTED NONE DETECTED   Barbiturates NONE DETECTED NONE DETECTED    Comment:        DRUG SCREEN FOR MEDICAL PURPOSES ONLY.  IF CONFIRMATION IS NEEDED FOR ANY PURPOSE, NOTIFY LAB WITHIN 5 DAYS.        LOWEST DETECTABLE LIMITS FOR URINE DRUG SCREEN Drug Class       Cutoff (ng/mL) Amphetamine  1000 Barbiturate      200 Benzodiazepine   970 Tricyclics       263 Opiates          300 Cocaine          300 THC              50   Procalcitonin - Baseline     Status: None   Collection Time: 12/28/15  8:35 AM  Result Value Ref Range   Procalcitonin <0.10 ng/mL    Comment:        Interpretation: PCT (Procalcitonin) <= 0.5 ng/mL: Systemic infection (sepsis) is not likely. Local bacterial infection is possible. (NOTE)         ICU PCT Algorithm               Non ICU PCT Algorithm    ----------------------------     ------------------------------         PCT < 0.25 ng/mL                 PCT < 0.1 ng/mL     Stopping of antibiotics            Stopping of antibiotics       strongly encouraged.               strongly encouraged.    ----------------------------     ------------------------------       PCT level decrease by               PCT < 0.25 ng/mL       >= 80% from peak PCT       OR PCT 0.25 - 0.5 ng/mL          Stopping of antibiotics                                             encouraged.     Stopping of antibiotics           encouraged.    ----------------------------     ------------------------------       PCT level decrease by              PCT >= 0.25 ng/mL       < 80% from peak PCT        AND PCT >= 0.5 ng/mL            Continuin g antibiotics                                              encouraged.       Continuing antibiotics            encouraged.    ----------------------------     ------------------------------     PCT level increase compared           PCT > 0.5 ng/mL         with peak PCT AND          PCT >= 0.5 ng/mL             Escalation of antibiotics  strongly encouraged.      Escalation of antibiotics        strongly encouraged.   Troponin I (q 6hr x 3)     Status: None   Collection Time: 12/28/15  8:35 AM  Result Value Ref Range   Troponin I <0.03 <0.03 ng/mL  TSH     Status: None   Collection Time: 12/28/15  8:35 AM  Result Value Ref Range   TSH 0.701 0.350 - 4.500 uIU/mL  I-Stat arterial blood gas, ED     Status: Abnormal   Collection Time: 12/28/15  1:26 PM  Result Value Ref Range   pH, Arterial 7.333 (L) 7.350 - 7.450   pCO2 arterial 36.4 32.0 - 48.0 mmHg   pO2, Arterial 90.0 83.0 - 108.0 mmHg   Bicarbonate 19.3 (L) 20.0 - 28.0 mmol/L   TCO2 20 0 - 100 mmol/L   O2 Saturation 96.0 %   Acid-base deficit 6.0 (H) 0.0 - 2.0 mmol/L   Patient temperature 98.6 F    Collection site RADIAL, ALLEN'S TEST ACCEPTABLE    Drawn by RT    Sample type ARTERIAL    No results found for: SDES, SPECREQUEST, CULT, REPTSTATUS Ct Head Wo Contrast  Result Date: 12/28/2015 CLINICAL DATA:  Fall.  Mental status change. EXAM: CT HEAD WITHOUT CONTRAST CT CERVICAL SPINE WITHOUT CONTRAST TECHNIQUE: Multidetector CT imaging of the head and cervical spine was performed following the standard protocol without intravenous contrast. Multiplanar CT image reconstructions of the cervical spine were also generated. COMPARISON:  MRI head 11/24/2010 FINDINGS: CT HEAD FINDINGS Brain: Ventricle size normal. Cerebral volume normal. Negative for acute infarct, hemorrhage, mass lesion. No edema or shift of the midline structures. Vascular: No hyperdense vessel or unexpected calcification. Skull: Normal. Negative for fracture or focal lesion. Sinuses/Orbits: Mild mucosal edema paranasal sinuses. Other: None CT CERVICAL SPINE FINDINGS Alignment: Normal Skull base and vertebrae: Negative for fracture or mass lesion. No acute  skeletal abnormality Soft tissues and spinal canal: Negative for soft tissue mass. Atherosclerotic calcification in the carotid bifurcation bilaterally Disc levels: Disc degeneration and moderate spurring C5-6 and C6-7. This contributes to spinal stenosis. Right foraminal narrowing C5-6 and C6-7 due to spurring. Moderate left foraminal narrowing C6-7 due to spurring. Upper chest: Apical emphysema without mass lesion. Other: None IMPRESSION: No acute intracranial abnormality Cervical spondylosis.  Negative for cervical spine fracture. Electronically Signed   By: Franchot Gallo M.D.   On: 12/28/2015 07:20   Ct Chest Wo Contrast  Result Date: 12/28/2015 CLINICAL DATA:  Pt is sob, has chest pain, exam done for lung nodule EXAM: CT CHEST WITHOUT CONTRAST TECHNIQUE: Multidetector CT imaging of the chest was performed following the standard protocol without IV contrast. COMPARISON:  CT 04/21/2014 FINDINGS: Cardiovascular: Coronary artery calcification and aortic atherosclerotic calcification. Mediastinum/Nodes: No axillary or supraclavicular adenopathy. Small mediastinal lymph nodes are numerous the not pathologically enlarged and unchanged from comparison exam. Largest example nodule measures 9 mm in the RIGHT lower paratracheal nodal station. Large hiatal hernia again demonstrated. Lungs/Pleura: Again demonstrated branching nodular pattern within the medial aspect of the LEFT upper lobe not changed in pattern from 11/02/2015. Small angular nodule in the RIGHT upper lobe is difficult to identify on current scan. No new pulmonary nodules. There is additionally, new branching nodular pattern (so-called tree-in-bud) within the RIGHT lower lobe which is increased from comparison exam. Mild bronchial thickening is present. Upper Abdomen: Limited view of the liver, kidneys, pancreas are unremarkable. Normal adrenal glands. Musculoskeletal: No aggressive  osseous lesion. IMPRESSION: 1. No suspicious nodularity for lung  carcinoma. Recommend continued CT lung screening per recommendations. 2. Stable branching nodular pattern in the LEFT upper lobe and new branching nodular pattern in the RIGHT lower lobe. These findings are moat consistent with chronic / recurrent / migratory infectious process. Consider mycobacterium avium intracellulare infection. 3. Stable prominent mediastinal lymph nodes. Electronically Signed   By: Suzy Bouchard M.D.   On: 12/28/2015 10:50   Ct Cervical Spine Wo Contrast  Result Date: 12/28/2015 CLINICAL DATA:  Fall.  Mental status change. EXAM: CT HEAD WITHOUT CONTRAST CT CERVICAL SPINE WITHOUT CONTRAST TECHNIQUE: Multidetector CT imaging of the head and cervical spine was performed following the standard protocol without intravenous contrast. Multiplanar CT image reconstructions of the cervical spine were also generated. COMPARISON:  MRI head 11/24/2010 FINDINGS: CT HEAD FINDINGS Brain: Ventricle size normal. Cerebral volume normal. Negative for acute infarct, hemorrhage, mass lesion. No edema or shift of the midline structures. Vascular: No hyperdense vessel or unexpected calcification. Skull: Normal. Negative for fracture or focal lesion. Sinuses/Orbits: Mild mucosal edema paranasal sinuses. Other: None CT CERVICAL SPINE FINDINGS Alignment: Normal Skull base and vertebrae: Negative for fracture or mass lesion. No acute skeletal abnormality Soft tissues and spinal canal: Negative for soft tissue mass. Atherosclerotic calcification in the carotid bifurcation bilaterally Disc levels: Disc degeneration and moderate spurring C5-6 and C6-7. This contributes to spinal stenosis. Right foraminal narrowing C5-6 and C6-7 due to spurring. Moderate left foraminal narrowing C6-7 due to spurring. Upper chest: Apical emphysema without mass lesion. Other: None IMPRESSION: No acute intracranial abnormality Cervical spondylosis.  Negative for cervical spine fracture. Electronically Signed   By: Franchot Gallo M.D.    On: 12/28/2015 07:20   Dg Chest Port 1 View  Result Date: 12/28/2015 CLINICAL DATA:  Shortness of breath. EXAM: PORTABLE CHEST 1 VIEW COMPARISON:  CT chest 11/02/2015.  Chest 08/23/2015. FINDINGS: Postoperative changes in the mediastinum. Heart size and pulmonary vascularity are normal. Emphysematous changes in the lungs. Peribronchial thickening with central interstitial changes consistent with chronic bronchitis. Increased opacity in the right lung apex may represent pleural thickening or fluid. This is new since previous study. Degenerative changes in the shoulders. IMPRESSION: Emphysematous changes and bronchitic changes in the lungs. New opacification of the right lung apex could represent pleural thickening or fluid. Degenerative changes in the shoulders. Electronically Signed   By: Lucienne Capers M.D.   On: 12/28/2015 06:18   No results found for this or any previous visit (from the past 240 hour(s)).    12/28/2015, 1:29 PM     LOS: 0 days    Records and images were personally review

## 2015-12-28 NOTE — ED Notes (Signed)
Patient transported to CT at this time via ED stretcher. Pt in no apparent distress at this time.   

## 2015-12-28 NOTE — ED Notes (Signed)
Patient requesting medication to help him have a bowel movement. States "I feel backed up and I know when I'm backed up."

## 2015-12-28 NOTE — ED Triage Notes (Signed)
Pt arrives to [facility] at this time via [facility].  Per EMS report pt has been lethargic at home for 2 days.  EMS reports that pt fell at home prior to there arrival.   Chief Complaint  Patient presents with  . Altered Mental Status   Past Medical History:  Diagnosis Date  . Allergy   . Anxiety   . Asthma   . Benign prostatic hypertrophy   . CAD (coronary artery disease)    a. s/p multiple PCIs to RCA and eventual CABG in 1999;  b.LHC 11/2003: CFX 95%, RCA occluded, SVG-RCA occluded, SVG-OM patent, native LAD patent. EF was 55%.  Patient had left to right collaterals and medical therapy was recommended    . COPD (chronic obstructive pulmonary disease) ([place])   . DDD (degenerative disc disease), lumbar   . Depression   . Diabetes mellitus    on no medications  . Diverticulitis, colon   . Emphysema of lung ([place])   . Esophageal stricture   . GERD (gastroesophageal reflux disease)   . Herniated disc   . Hiatal hernia   . History of colonic polyps   . HOH (hard of hearing)   . Hx of colonoscopy   . Hyperlipidemia   . Hypertension   . Insomnia   . Iron deficiency anemia   . Lumbar back pain    chronic  . Memory loss   . OA (osteoarthritis)   . OSA (obstructive sleep apnea)    wife denies this  . Pneumonia   . PVD (peripheral vascular disease) ([place])   . Shortness of breath dyspnea    on home O2  . Spinal stenosis

## 2015-12-28 NOTE — Progress Notes (Signed)
RT to pt's room to collect scheduled ABG but pt in MRI. WIll attempt again later. RT will continue to monitor

## 2015-12-28 NOTE — H&P (Signed)
History and Physical    [name] PQZ:[id number] DOB: 07-Feb-1941 DOA: 12/28/2015  PCP: [name], DO Patient coming from: home  Chief Complaint: lethargy and confusion  HPI: Tyler TIJERINA Sr. is a 75 y.o. male with medical history significant of MI/CAD/s/p CABG, COPD, DDD, depression/anxiety, DM - diet controlled, GERD, hiatal hernia, HLD, HTN, memory loss, PVD, osa, who presents with 2 days of progressive worsening lethargy and confusion. Wife states that he is "tipsy on his feet" as if he had had alcohol. No alcohol use in several years. 2 weeks ago patient had his multivitamin stopped and replaced with Rena-Vite after seeing a nephrologist. 2 months ago patient was started on Lasix. No other recent medication changes. Occasions are administered by patient's wife due to his baseline memory loss, question undiagnosed dementia. Patient does go to a pain clinic for his narcotics though he is only on Vicodin. Patient has had slight increased polyuria but this is been related to intentional increase in fluid intake by the patient. No focal complaints such as chest pain, palpitations, cough, dysuria, back pain, rash, fevers, nausea, vomiting, headache, unilateral weakness or numbness, slurred speech, difficulty swallowing, drooling. Patient reports having a stress test done one month ago by his cardiologist which was essentially normal. This was in preparation for an upcoming 6 shoulder surgery. Patient continues to smoke    ED Course: objective findings outlined below. No medications given to reverse or treat his condition   Review of Systems: As per HPI otherwise 10 point review of systems negative.   Ambulatory Status: weak but able to ambulate by self  Past Medical History:  Diagnosis Date  . Allergy   . Anxiety   . Asthma   . Benign prostatic hypertrophy   . CAD (coronary artery disease)    a. s/p multiple PCIs to RCA and eventual CABG in 1999;  b.LHC 11/2003: CFX 95%,  RCA occluded, SVG-RCA occluded, SVG-OM patent, native LAD patent. EF was 55%.  Patient had left to right collaterals and medical therapy was recommended    . COPD (chronic obstructive pulmonary disease) ([place])   . DDD (degenerative disc disease), lumbar   . Depression   . Diabetes mellitus    on no medications  . Diverticulitis, colon   . Emphysema of lung ([place])   . Esophageal stricture   . GERD (gastroesophageal reflux disease)   . Herniated disc   . Hiatal hernia   . History of colonic polyps   . HOH (hard of hearing)   . Hx of colonoscopy   . Hyperlipidemia   . Hypertension   . Insomnia   . Iron deficiency anemia   . Lumbar back pain    chronic  . Memory loss   . OA (osteoarthritis)   . OSA (obstructive sleep apnea)    wife denies this  . Pneumonia   . PVD (peripheral vascular disease) ([place])   . Shortness of breath dyspnea    on home O2  . Spinal stenosis     Past Surgical History:  Procedure Laterality Date  . bilateral knee replacements    . CARPAL TUNNEL RELEASE    . COLONOSCOPY N/A 10/13/2015   Procedure: COLONOSCOPY;  Surgeon: Irene Shipper, MD;  Location: [facility] ENDOSCOPY;  Service: Endoscopy;  Laterality: N/A;  . CORONARY ANGIOPLASTY WITH STENT PLACEMENT  2/99  . CORONARY ARTERY BYPASS GRAFT  09/1997   double bypss  . heart bypass    . HEMORRHOID SURGERY    .  SHOULDER ARTHROSCOPY  right  . STRABISMUS SURGERY  08/29/2011   Procedure: REPAIR STRABISMUS;  Surgeon: Derry Skill, MD;  Location: [place] [facility];  Service: Ophthalmology;  Laterality: Left;    Social History   Social History  . Marital status: Married    Spouse name: N/A  . Number of children: 5  . Years of education: N/A   Occupational History  . Retired-construction    Social History Main Topics  . Smoking status: Current Every Day Smoker    Packs/day: 1.00    Years: 65.00    Types: Cigarettes    Start date: 02/14/1948  . Smokeless tobacco: Never Used     Comment: Peak rate of 3ppd  . Alcohol use  No     Comment: none for 20 years   . Drug use: No  . Sexual activity: Not on file   Other Topics Concern  . Not on file   Social History Narrative   Originally from [place]. Previously lived in [place]. Previously worked in a [employer]. 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 Does have asbestos exposure. No known mold or bird exposure.     Allergies  Allergen Reactions  . Celecoxib Nausea And Vomiting    REACTION: GI upset  . Chicken Protein Other (See Comments)    Will not eat chicken  . Fish-Derived Products Other (See Comments)    Will not eat seafood of any kind  . Morphine Itching    REACTION: itching  . Nalbuphine Other (See Comments)    REACTION: contraindication with oxycontin.  . Prednisone Other (See Comments)    REACTION: "yeast infections"  . Venlafaxine Other (See Comments)    REACTION: GI upset    Family History  Problem Relation Age of Onset  . Stroke Father   . Emphysema Father   . Heart disease    . Hypertension    . Hyperlipidemia    . Allergies    . Diabetes    . Kidney cancer Daughter   . Breast cancer Daughter   . Colon cancer Neg Hx     Prior to Admission medications   Medication Sig Start Date End Date Taking? Authorizing Provider  albuterol (VENTOLIN HFA) 108 (90 BASE) MCG/ACT inhaler Inhale 2 puffs into the lungs every 6 (six) hours as needed for wheezing or shortness of breath. 04/07/14   Biagio Borg, MD  ALPRAZolam Duanne Moron) 0.25 MG tablet take 1 tablet by mouth twice a day if needed for anxiety or sleep 11/29/15   [name], DO  AMBULATORY NON FORMULARY MEDICATION 2.5 Liters home oxygen continuous    Historical Provider, MD  Aspirin-Salicylamide-Caffeine (BC HEADACHE POWDER PO) Take 1 Package by mouth 4 (four) times daily as needed (headache or pain).    Historical Provider, MD  donepezil (ARICEPT) 10 MG tablet Take 1 tablet (10  mg total) by mouth at bedtime. 10/17/15   [name], DO  DULoxetine (CYMBALTA) 30 MG capsule  01/24/14   Historical Provider, MD  DULoxetine (CYMBALTA) 60 MG capsule Take 1 capsule (60 mg total) by mouth daily. 10/09/15   [name], DO  fluticasone [facility]) 50 MCG/ACT nasal spray instill 2 sprays into each nostril once daily Patient taking differently: instill 2 sprays into each nostril once daily as needed 05/11/14   Biagio Borg, MD  furosemide (LASIX) 20 MG tablet Take  0.5 tablets (10 mg total) by mouth 2 (two) times daily. 12/21/15   [name], DO  HYDROcodone-acetaminophen (NORCO) 10-325 MG tablet take 1 tablet by mouth every 4 to 6 hours if needed for pain (NO MORE THAN 5 PER DAY) 06/08/15   Historical Provider, MD  lamoTRIgine (LAMICTAL) 25 MG tablet take 1 tablet at bedtime for 14 days then 1 tablet twice a day 11/14/14   Historical Provider, MD  Multiple Vitamin (MULTIVITAMIN) capsule Take 1 capsule by mouth daily.     Historical Provider, MD  nitroGLYCERIN (NITROSTAT) 0.4 MG SL tablet Place 1 tablet (0.4 mg total) under the tongue every 5 (five) minutes as needed. For chest pain 11/09/14   Burnell Blanks, MD  omeprazole (PRILOSEC) 20 MG capsule Take 1 capsule (20 mg total) by mouth 2 (two) times daily before a meal. 12/06/15   [name], DO  pravastatin (PRAVACHOL) 80 MG tablet take 1 tablet by mouth once daily 11/27/15   [name], DO  terbinafine (LAMISIL AT) 1 % cream Apply 1 application topically 2 (two) times daily. 08/23/15   [name], DO  umeclidinium bromide (INCRUSE ELLIPTA) 62.5 MCG/INH AEPB Inhale 1 puff into the lungs daily. 08/23/15   [name], DO    Physical Exam: Vitals:   12/28/15 0730 12/28/15 0830 12/28/15 0900 12/28/15 0909  BP: 128/73 120/66 137/70   Pulse: 87 79 78   Resp: 18 16 21    Temp:    97.6 F (36.4 C)  TempSrc:    Oral  SpO2: 99% 99% 100%   Weight:      Height:         General:  Appears calm and  comfortable Eyes:  PERRL, EOMI, normal lids, iris ENT: Dry mucous membranes, poor dentition Neck: no LAD, masses or thyromegaly Cardiovascular:  RRR, difficult to appreciate. No LE edema.  Respiratory: Normal effort, slightly diminished in the bases with few faint intermittent crackles Abdomen:  soft, ntnd, NABS Skin:  no rash or induration seen on limited exam Musculoskeletal:  grossly normal tone BUE/BLE, good ROM, no bony abnormality Psychiatric: Patient is extremely sleepy but answers questions appropriately and follows commands when awake. Neurologic:  CN 2-12 grossly intact, moves all extremities in coordinated fashion, sensation intact  Labs on Admission: I have personally reviewed following labs and imaging studies  CBC:  Recent Labs Lab 12/28/15 0555  WBC 7.6  NEUTROABS 5.2  HGB 10.5*  HCT 32.9*  MCV 92.7  PLT 007   Basic Metabolic Panel:  Recent Labs Lab 12/28/15 0555  NA 140  K 4.0  CL 113*  CO2 16*  GLUCOSE 122*  BUN 40*  CREATININE 1.82*  CALCIUM 8.4*   GFR: Estimated Creatinine Clearance: 35.3 mL/min (by C-G formula based on SCr of 1.82 mg/dL (H)). Liver Function Tests:  Recent Labs Lab 12/28/15 0555  AST 27  ALT 14*  ALKPHOS 82  BILITOT 0.5  PROT 6.6  ALBUMIN 3.1*   No results for input(s): LIPASE, AMYLASE in the last 168 hours.  Recent Labs Lab 12/28/15 0555  AMMONIA 21   Coagulation Profile: No results for input(s): INR, PROTIME in the last 168 hours. Cardiac Enzymes: No results for input(s): CKTOTAL, CKMB, CKMBINDEX, TROPONINI in the last 168 hours. BNP (last 3 results) No results for input(s): PROBNP in the last 8760 hours. HbA1C: No results for input(s): HGBA1C in the last 72 hours. CBG:  Recent Labs Lab 12/28/15 0555  GLUCAP 116*   Lipid Profile: No results for  input(s): CHOL, HDL, LDLCALC, TRIG, CHOLHDL, LDLDIRECT in the last 72 hours. Thyroid Function Tests: No results for input(s): TSH, T4TOTAL, FREET4, T3FREE,  THYROIDAB in the last 72 hours. Anemia Panel: No results for input(s): VITAMINB12, FOLATE, FERRITIN, TIBC, IRON, RETICCTPCT in the last 72 hours. Urine analysis:    Component Value Date/Time   COLORURINE YELLOW 12/28/2015 0721   APPEARANCEUR CLEAR 12/28/2015 0721   LABSPEC 1.016 12/28/2015 0721   PHURINE 5.0 12/28/2015 0721   GLUCOSEU NEGATIVE 12/28/2015 0721   GLUCOSEU NEGATIVE 12/03/2013 1445   HGBUR NEGATIVE 12/28/2015 0721   BILIRUBINUR NEGATIVE 12/28/2015 0721   KETONESUR NEGATIVE 12/28/2015 0721   PROTEINUR NEGATIVE 12/28/2015 0721   UROBILINOGEN 0.2 12/03/2013 1445   NITRITE NEGATIVE 12/28/2015 0721   LEUKOCYTESUR NEGATIVE 12/28/2015 0721    Creatinine Clearance: Estimated Creatinine Clearance: 35.3 mL/min (by C-G formula based on SCr of 1.82 mg/dL (H)).  Sepsis Labs: @LABRCNTIP (procalcitonin:4,lacticidven:4) )No results found for this or any previous visit (from the past 240 hour(s)).   Radiological Exams on Admission: Ct Head Wo Contrast  Result Date: 12/28/2015 CLINICAL DATA:  Fall.  Mental status change. EXAM: CT HEAD WITHOUT CONTRAST CT CERVICAL SPINE WITHOUT CONTRAST TECHNIQUE: Multidetector CT imaging of the head and cervical spine was performed following the standard protocol without intravenous contrast. Multiplanar CT image reconstructions of the cervical spine were also generated. COMPARISON:  MRI head 11/24/2010 FINDINGS: CT HEAD FINDINGS Brain: Ventricle size normal. Cerebral volume normal. Negative for acute infarct, hemorrhage, mass lesion. No edema or shift of the midline structures. Vascular: No hyperdense vessel or unexpected calcification. Skull: Normal. Negative for fracture or focal lesion. Sinuses/Orbits: Mild mucosal edema paranasal sinuses. Other: None CT CERVICAL SPINE FINDINGS Alignment: Normal Skull base and vertebrae: Negative for fracture or mass lesion. No acute skeletal abnormality Soft tissues and spinal canal: Negative for soft tissue mass.  Atherosclerotic calcification in the carotid bifurcation bilaterally Disc levels: Disc degeneration and moderate spurring C5-6 and C6-7. This contributes to spinal stenosis. Right foraminal narrowing C5-6 and C6-7 due to spurring. Moderate left foraminal narrowing C6-7 due to spurring. Upper chest: Apical emphysema without mass lesion. Other: None IMPRESSION: No acute intracranial abnormality Cervical spondylosis.  Negative for cervical spine fracture. Electronically Signed   By: Franchot Gallo M.D.   On: 12/28/2015 07:20   Ct Cervical Spine Wo Contrast  Result Date: 12/28/2015 CLINICAL DATA:  Fall.  Mental status change. EXAM: CT HEAD WITHOUT CONTRAST CT CERVICAL SPINE WITHOUT CONTRAST TECHNIQUE: Multidetector CT imaging of the head and cervical spine was performed following the standard protocol without intravenous contrast. Multiplanar CT image reconstructions of the cervical spine were also generated. COMPARISON:  MRI head 11/24/2010 FINDINGS: CT HEAD FINDINGS Brain: Ventricle size normal. Cerebral volume normal. Negative for acute infarct, hemorrhage, mass lesion. No edema or shift of the midline structures. Vascular: No hyperdense vessel or unexpected calcification. Skull: Normal. Negative for fracture or focal lesion. Sinuses/Orbits: Mild mucosal edema paranasal sinuses. Other: None CT CERVICAL SPINE FINDINGS Alignment: Normal Skull base and vertebrae: Negative for fracture or mass lesion. No acute skeletal abnormality Soft tissues and spinal canal: Negative for soft tissue mass. Atherosclerotic calcification in the carotid bifurcation bilaterally Disc levels: Disc degeneration and moderate spurring C5-6 and C6-7. This contributes to spinal stenosis. Right foraminal narrowing C5-6 and C6-7 due to spurring. Moderate left foraminal narrowing C6-7 due to spurring. Upper chest: Apical emphysema without mass lesion. Other: None IMPRESSION: No acute intracranial abnormality Cervical spondylosis.  Negative for  cervical spine fracture. Electronically Signed  By: Franchot Gallo M.D.   On: 12/28/2015 07:20   Dg Chest Port 1 View  Result Date: 12/28/2015 CLINICAL DATA:  Shortness of breath. EXAM: PORTABLE CHEST 1 VIEW COMPARISON:  CT chest 11/02/2015.  Chest 08/23/2015. FINDINGS: Postoperative changes in the mediastinum. Heart size and pulmonary vascularity are normal. Emphysematous changes in the lungs. Peribronchial thickening with central interstitial changes consistent with chronic bronchitis. Increased opacity in the right lung apex may represent pleural thickening or fluid. This is new since previous study. Degenerative changes in the shoulders. IMPRESSION: Emphysematous changes and bronchitic changes in the lungs. New opacification of the right lung apex could represent pleural thickening or fluid. Degenerative changes in the shoulders. Electronically Signed   By: Lucienne Capers M.D.   On: 12/28/2015 06:18    EKG: Independently reviewed. Sinus bradycardia. No ACS apparent  Assessment/Plan Principal Problem:   Acute encephalopathy Active Problems:   TOBACCO USER   Type 2 diabetes mellitus with circulatory disorder (HCC)   Bradycardia   Chronic kidney disease   Lung nodule < 6cm on CT   Diabetes mellitus with complication (HCC)   Hx of CABG   Dementia    Acute encephalopathy: Etiology not immediately clear. Mild non-anion gap metabolic acidosis. Suspect this may be from worsening renal function with loss of bicarbonate. Patient started Lasix 2 months ago. Other etiologies could include elevated Lamictal level versus opioid-induced given worsening renal function. Wife administers medications and says that there have been no changes recently. No infectious etiology apparent given normal UA and chest x-ray. CT scan head without evidence of acute process. Ammonia normal, BUN normal. Lactic acid 0.55, glucose normal, hemoglobin slightly decreased at 10.5. -  MRI head - CT scan chest - of note  patient had a CT scan [facility] July which showed right apical pulmonary nodule which may be what is seen on chest x-ray today. Patient continues to smoke - Narcan 1 - Lamictal level - Bicarbonate, IVF - repeat BMP and ABG after administration - Follow-up blood culture and urine culture  Symptomatic bradycardia: Patient's heart rate fluctuating wildly from high 30s to high 80s. Baseline appears to be around 80. Patient with pacer pads in place. Patient with a history of MI/CAD status post CABG. Patient states to smoke. Nuclear medicine stress test performed one month ago by Dr. Angelena Form was essentially normal. EKG showing normal sinus rhythm without evidence of ACS, troponin 0.03, BNP 69 - Cycle troponin - Stat echo - Cardiology consult - Hold lasix  R Apical Lung Opacification: noted on CXR. Continues to smoke. CT from July w/ R apical 49mm nodulem and scattered tree-in-bud densities in the lungs.  - CT scan as above - smoking cessation counseling given - nicotine   AoCKD: Cr 1.82 w/ baseline 1.42. Suspect from dehydration and new diuretic use.  - IVf - hold lasix - BMET in am  DM: diet controlled. Last A1c 6.1 - SSI  COPD: - continue Ellipta, albuterol  Chronic pain: - continue norco  GERD: - continue ppi  Dementia/Depression/Anxiety: - continue xanax, cymbalta, lamictal, aricept  HLD: - contineu statin  DVT prophylaxis: hep Code Status: DNI  Family Communication: wife  Disposition Plan: pending improvememnt  Consults called: cards  Admission status: inpt    MERRELL, DAVID J MD [facility]  If 7PM-7AM, please contact night-coverage [email or web address] Password [facility]  12/28/2015, 9:57 AM

## 2015-12-28 NOTE — Telephone Encounter (Signed)
Patient's wife called to let Dr. Sheppard Coil know that he has been admitted to the hostipal. He has been fatigued for that past several days. The wife states his urine and potassium are ok but they are going to do a chest CT and MRI of the head. She states she can be reached on her cell today if need be [phone]. This is an Pharmacist, hospital

## 2015-12-28 NOTE — Progress Notes (Signed)
Update.   CT scan results noted to show the following  Stable branching nodular pattern in the LEFT upper lobe and new branching nodular pattern in the RIGHT lower lobe. These findings are moat consistent with chronic / recurrent / migratory infectious process. Consider mycobacterium avium intracellulare infection.  I have called ID, Dr. Megan Salon to evaluate pt and greatly appreciate his input.   Will defer ABX regimen to him.  I have ordered HIV, RPR, sputum Cx and gram stain, Influenza pcr, and legionella and Strep pneumo Ag studies per protocol.   Linna Darner, MD [facility] 12/28/2015, 11:22 AM

## 2015-12-28 NOTE — Consult Note (Signed)
CARDIOLOGY CONSULT NOTE   Patient ID: Tyler Napoleon Sr. MRN: [id number] DOB/AGE: [birth year] 73 y.o.  Admit date: 12/28/2015  Requesting Physician: Dr. [name] Primary Physician:   [name], DO Primary Cardiologist:  Dr. [name] Reason for Consultation: intermittent bradycardia  HPI: Tyler UHDE Sr. is a 75 y.o. male with a history of CAD s/p CABG 1999, HTN, HLD, severe COPD, ongoing tobacco abuse, DM, GERD, PAD who presented to [facility] with progressively worsening lethargy and confusion. His HR was noted to go into the 30s on tele and cardiology consulted.   Last cath in August 2005 demonstrated mild LAD disease, patent large OM branch with occluded distal Circumflex filling by vein graft, occluded RCA with occluded vein graft to RCA, RCA filling from left to right collaterals. He has been managed medically since that time. He continues to smoke. He previously followed with Dr [name] but then established care with Dr. [name] in 10/2013. He complained of chest pain at that time and myoview was arranged but he cancelled the test.   He was seen by Dr. [name] last in 10/2015 for pre-operative examination before planned left shoulder replacement. He denied chest pain and reported having dyspnea at baseline due to severe COPD, which hadn't changed He is still smoking 1ppd. A nuclear stress test was set up on 12/07/15 which was low risk with EF 57%. It was noted that he was not taking a daily ASA or BB but he did not want to add anymore medications.   Patient was in his ususal state of health until a couple of days ago and developed acute encephalopathy this AM. Patient seen in the ER and currently non responsive. Will grumble a little to questions. Discussed history with wife. A couple months ago his PCP did some routine labs and found him to have hyperkalemia so he was placed on lasix and sent to a nephrologist. She said over the past couple days he just "hasn't felt good" but  unable to qualify more than that. This AM around 4 am he woke her up getting up to go to the bathroom. She saw him slide down the floor and he was unresponsive so she called EMS. EMS brought him to [facility] ER for acute encephalopathy.   In the ER he was found to have a mild non-anion gap metabolic acidosis. IM is admitted who suspected that this may be from worsening renal function with loss of bicarbonate 2/2 to newly started lasix. Other etiologies could include elevated Lamictal level versus opioid-induced given worsening renal function. Wife administers medications and says that there have been no changes recently. No infectious etiology apparent given normal UA and chest x-ray. CT scan head without evidence of acute process. Ammonia normal, BUN normal. Lactic acid 0.55, glucose normal, hemoglobin slightly decreased at 10.5. He was getting an ECHO done at beside which showed normal LV function and no other gross abnormalities. CXR did show a pulmonary nodule that will need further work up given his heavy smoking history.    Past Medical History:  Diagnosis Date  . Allergy   . Anxiety   . Asthma   . Benign prostatic hypertrophy   . CAD (coronary artery disease)    a. s/p multiple PCIs to RCA and eventual CABG in 1999;  b.LHC 11/2003: CFX 95%, RCA occluded, SVG-RCA occluded, SVG-OM patent, native LAD patent. EF was 55%.  Patient had left to right collaterals and medical therapy was recommended    . COPD (  chronic obstructive pulmonary disease) ([place])   . DDD (degenerative disc disease), lumbar   . Depression   . Diabetes mellitus    on no medications  . Diverticulitis, colon   . Emphysema of lung ([place])   . Esophageal stricture   . GERD (gastroesophageal reflux disease)   . Herniated disc   . Hiatal hernia   . History of colonic polyps   . HOH (hard of hearing)   . Hx of colonoscopy   . Hyperlipidemia   . Hypertension   . Insomnia   . Iron deficiency anemia   . Lumbar back pain    chronic    . Memory loss   . OA (osteoarthritis)   . OSA (obstructive sleep apnea)    wife denies this  . Pneumonia   . PVD (peripheral vascular disease) ([place])   . Shortness of breath dyspnea    on home O2  . Spinal stenosis      Past Surgical History:  Procedure Laterality Date  . bilateral knee replacements    . CARPAL TUNNEL RELEASE    . COLONOSCOPY N/A 10/13/2015   Procedure: COLONOSCOPY;  Surgeon: Irene Shipper, MD;  Location: [facility] ENDOSCOPY;  Service: Endoscopy;  Laterality: N/A;  . CORONARY ANGIOPLASTY WITH STENT PLACEMENT  2/99  . CORONARY ARTERY BYPASS GRAFT  09/1997   double bypss  . heart bypass    . HEMORRHOID SURGERY    . SHOULDER ARTHROSCOPY  right  . STRABISMUS SURGERY  08/29/2011   Procedure: REPAIR STRABISMUS;  Surgeon: Derry Skill, MD;  Location: [place];  Service: Ophthalmology;  Laterality: Left;    Allergies  Allergen Reactions  . Celecoxib Nausea And Vomiting    REACTION: GI upset  . Chicken Protein Other (See Comments)    Will not eat chicken  . Fish-Derived Products Other (See Comments)    Will not eat seafood of any kind  . Morphine Itching    REACTION: itching  . Nalbuphine Other (See Comments)    REACTION: contraindication with oxycontin.  . Prednisone Other (See Comments)    REACTION: "yeast infections"  . Venlafaxine Other (See Comments)    REACTION: GI upset    I have reviewed the patient's current medications     Prior to Admission medications   Medication Sig Start Date End Date Taking? Authorizing Provider  albuterol (VENTOLIN HFA) 108 (90 BASE) MCG/ACT inhaler Inhale 2 puffs into the lungs every 6 (six) hours as needed for wheezing or shortness of breath. 04/07/14   Biagio Borg, MD  ALPRAZolam Duanne Moron) 0.25 MG tablet take 1 tablet by mouth twice a day if needed for anxiety or sleep 11/29/15   [name], DO  AMBULATORY NON FORMULARY MEDICATION 2.5 Liters home oxygen continuous    Historical Provider, MD  Aspirin-Salicylamide-Caffeine  (BC HEADACHE POWDER PO) Take 1 Package by mouth 4 (four) times daily as needed (headache or pain).    Historical Provider, MD  donepezil (ARICEPT) 10 MG tablet Take 1 tablet (10 mg total) by mouth at bedtime. 10/17/15   [name], DO  DULoxetine (CYMBALTA) 30 MG capsule  01/24/14   Historical Provider, MD  DULoxetine (CYMBALTA) 60 MG capsule Take 1 capsule (60 mg total) by mouth daily. 10/09/15   [name], DO  fluticasone [facility]) 50 MCG/ACT nasal spray instill 2 sprays into each nostril once daily Patient taking differently: instill 2 sprays into each nostril once daily as needed 05/11/14   Biagio Borg, MD  furosemide (LASIX)  20 MG tablet Take 0.5 tablets (10 mg total) by mouth 2 (two) times daily. 12/21/15   [name], DO  HYDROcodone-acetaminophen (NORCO) 10-325 MG tablet take 1 tablet by mouth every 4 to 6 hours if needed for pain (NO MORE THAN 5 PER DAY) 06/08/15   Historical Provider, MD  lamoTRIgine (LAMICTAL) 25 MG tablet take 1 tablet at bedtime for 14 days then 1 tablet twice a day 11/14/14   Historical Provider, MD  Multiple Vitamin (MULTIVITAMIN) capsule Take 1 capsule by mouth daily.     Historical Provider, MD  nitroGLYCERIN (NITROSTAT) 0.4 MG SL tablet Place 1 tablet (0.4 mg total) under the tongue every 5 (five) minutes as needed. For chest pain 11/09/14   Burnell Blanks, MD  omeprazole (PRILOSEC) 20 MG capsule Take 1 capsule (20 mg total) by mouth 2 (two) times daily before a meal. 12/06/15   [name], DO  pravastatin (PRAVACHOL) 80 MG tablet take 1 tablet by mouth once daily 11/27/15   [name], DO  terbinafine (LAMISIL AT) 1 % cream Apply 1 application topically 2 (two) times daily. 08/23/15   [name], DO  umeclidinium bromide (INCRUSE ELLIPTA) 62.5 MCG/INH AEPB Inhale 1 puff into the lungs daily. 08/23/15   [name], DO     Social History   Social History  . Marital status: Married    Spouse name: N/A  . Number of  children: 5  . Years of education: N/A   Occupational History  . Retired-construction    Social History Main Topics  . Smoking status: Current Every Day Smoker    Packs/day: 1.00    Years: 65.00    Types: Cigarettes    Start date: 02/14/1948  . Smokeless tobacco: Never Used     Comment: Peak rate of 3ppd  . Alcohol use No     Comment: none for 20 years   . Drug use: No  . Sexual activity: Not on file   Other Topics Concern  . Not on file   Social History Narrative   Originally from [place]. Previously lived in [place]. Previously worked in a [employer]. 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 Does have asbestos exposure. No known mold or bird exposure.     Family Status  Relation Status  . Father Deceased  . Mother Deceased  . Maternal Grandmother Deceased  . Maternal Grandfather Deceased  . Paternal Grandmother Deceased  . Paternal Grandfather Deceased  .    Marland Kitchen Daughter   . Neg Hx    Family History  Problem Relation Age of Onset  . Stroke Father   . Emphysema Father   . Heart disease    . Hypertension    . Hyperlipidemia    . Allergies    . Diabetes    . Kidney cancer Daughter   . Breast cancer Daughter   . Colon cancer Neg Hx    ROS:  Full 14 point review of systems complete and found to be negative unless listed above.  Physical Exam: Blood pressure 137/70, pulse 78, temperature 97.6 F (36.4 C), temperature source Oral, resp. rate 21, height 5\' 11"  (1.803 m), weight 150 lb (68 kg), SpO2 100 %.  General: Well developed, well nourished, male in no acute distress. Non responsive Head: Eyes PERRLA, No xanthomas.   Normocephalic and atraumatic, oropharynx without edema or exudate. Dentition:  Lungs: CTAB Heart: HRRR S1 S2, no rub/gallop, Heart irregular rate and rhythm with S1, S2 No murmur. pulses are 2+ extrem.   Neck: No carotid bruits. No lymphadenopathy.  NO JVD. Abdomen: Bowel sounds present, abdomen soft and non-tender without masses or hernias noted. Msk:  No spine or cva tenderness. No weakness, no joint deformities or effusions. Extremities: No clubbing or cyanosis. No LE edema.  Neuro: asleep getting echo Psych:  Good affect, responds appropriately Skin: No rashes or lesions noted.  Labs:   Lab Results  Component Value Date   WBC 7.6 12/28/2015   HGB 10.5 (L) 12/28/2015   HCT 32.9 (L) 12/28/2015   MCV 92.7 12/28/2015   PLT 314 12/28/2015   No results for input(s): INR in the last 72 hours.  Recent Labs Lab 12/28/15 0555  NA 140  K 4.0  CL 113*  CO2 16*  BUN 40*  CREATININE 1.82*  CALCIUM 8.4*  PROT 6.6  BILITOT 0.5  ALKPHOS 82  ALT 14*  AST 27  GLUCOSE 122*  ALBUMIN 3.1*   Magnesium  Date Value Ref Range Status  09/14/2015 1.9 1.5 - 2.5 mg/dL Final   No results for input(s): CKTOTAL, CKMB, TROPONINI in the last 72 hours.  Recent Labs  12/28/15 0606  TROPIPOC 0.03   Pro B Natriuretic peptide (BNP)  Date/Time Value Ref Range Status  10/16/2012 11:10 PM 321.4 (H) 0 - 125 pg/mL Final  01/17/2011 04:30 PM 43.0 0.0 - 100.0 pg/mL Final   Lab Results  Component Value Date   CHOL 127 04/04/2015   HDL 31 (L) 04/04/2015   LDLCALC 58 04/04/2015   TRIG 190 (H) 04/04/2015   No results found for: DDIMER Lipase  Date/Time Value Ref Range Status  06/24/2007 11:34 AM 15.0 11.0 - 59.0 units/L Final   Amylase  Date/Time Value Ref Range Status  06/24/2007 11:34 AM 34 27 - 131 units/L Final   TSH  Date/Time Value Ref Range Status  08/23/2015 04:45 PM 0.87 0.40 - 4.50 mIU/L Final   Vitamin B-12  Date/Time Value Ref Range Status  01/16/2010 03:54 PM 449 211 - 911 pg/mL Final   Folate  Date/Time Value Ref Range Status  01/16/2010 03:54 PM 18.9 ng/mL Final    Comment:    See lab report for associated comment(s)   Iron  Date/Time Value Ref Range Status  01/16/2010 03:54 PM 45 42 - 165 mcg/dL Final     Echo: pending  Nuclear stress test 12/07/15 Study Highlights    The left ventricular ejection fraction is normal (55-65%).  Nuclear stress EF: 57%.  There was no ST segment deviation noted during stress.  The study is normal.  This is a low risk study.   Normal pharmacologic nuclear stress test with no evidence of prior infarct or ischemia.      ECG:  Sinus brady hr 40  Radiology:  Ct Head Wo Contrast  Result Date: 12/28/2015 CLINICAL DATA:  Fall.  Mental status change. EXAM: CT HEAD WITHOUT CONTRAST CT CERVICAL SPINE WITHOUT CONTRAST TECHNIQUE: Multidetector CT imaging of the head and cervical spine was performed following the standard protocol without intravenous contrast. Multiplanar CT image reconstructions of the cervical spine were also generated. COMPARISON:  MRI head 11/24/2010 FINDINGS: CT HEAD FINDINGS Brain: Ventricle size normal. Cerebral volume normal. Negative for acute infarct, hemorrhage, mass lesion. No edema or shift of the midline structures. Vascular: No hyperdense vessel or unexpected calcification. Skull: Normal. Negative for fracture or focal lesion. Sinuses/Orbits: Mild  mucosal edema paranasal sinuses. Other: None CT CERVICAL SPINE FINDINGS Alignment: Normal Skull base and vertebrae: Negative for fracture or mass lesion. No acute skeletal abnormality Soft tissues and spinal canal: Negative for soft tissue mass. Atherosclerotic calcification in the carotid bifurcation bilaterally Disc levels: Disc degeneration and moderate spurring C5-6 and C6-7. This contributes to spinal stenosis. Right foraminal narrowing C5-6 and C6-7 due to spurring. Moderate left foraminal narrowing C6-7 due to spurring. Upper chest: Apical emphysema without mass lesion. Other: None IMPRESSION: No acute intracranial abnormality Cervical spondylosis.  Negative for cervical spine fracture. Electronically Signed   By: Franchot Gallo M.D.   On: 12/28/2015 07:20   Ct Cervical Spine Wo  Contrast  Result Date: 12/28/2015 CLINICAL DATA:  Fall.  Mental status change. EXAM: CT HEAD WITHOUT CONTRAST CT CERVICAL SPINE WITHOUT CONTRAST TECHNIQUE: Multidetector CT imaging of the head and cervical spine was performed following the standard protocol without intravenous contrast. Multiplanar CT image reconstructions of the cervical spine were also generated. COMPARISON:  MRI head 11/24/2010 FINDINGS: CT HEAD FINDINGS Brain: Ventricle size normal. Cerebral volume normal. Negative for acute infarct, hemorrhage, mass lesion. No edema or shift of the midline structures. Vascular: No hyperdense vessel or unexpected calcification. Skull: Normal. Negative for fracture or focal lesion. Sinuses/Orbits: Mild mucosal edema paranasal sinuses. Other: None CT CERVICAL SPINE FINDINGS Alignment: Normal Skull base and vertebrae: Negative for fracture or mass lesion. No acute skeletal abnormality Soft tissues and spinal canal: Negative for soft tissue mass. Atherosclerotic calcification in the carotid bifurcation bilaterally Disc levels: Disc degeneration and moderate spurring C5-6 and C6-7. This contributes to spinal stenosis. Right foraminal narrowing C5-6 and C6-7 due to spurring. Moderate left foraminal narrowing C6-7 due to spurring. Upper chest: Apical emphysema without mass lesion. Other: None IMPRESSION: No acute intracranial abnormality Cervical spondylosis.  Negative for cervical spine fracture. Electronically Signed   By: Franchot Gallo M.D.   On: 12/28/2015 07:20   Dg Chest Port 1 View  Result Date: 12/28/2015 CLINICAL DATA:  Shortness of breath. EXAM: PORTABLE CHEST 1 VIEW COMPARISON:  CT chest 11/02/2015.  Chest 08/23/2015. FINDINGS: Postoperative changes in the mediastinum. Heart size and pulmonary vascularity are normal. Emphysematous changes in the lungs. Peribronchial thickening with central interstitial changes consistent with chronic bronchitis. Increased opacity in the right lung apex may represent  pleural thickening or fluid. This is new since previous study. Degenerative changes in the shoulders. IMPRESSION: Emphysematous changes and bronchitic changes in the lungs. New opacification of the right lung apex could represent pleural thickening or fluid. Degenerative changes in the shoulders. Electronically Signed   By: Lucienne Capers M.D.   On: 12/28/2015 06:18    ASSESSMENT AND PLAN:    Active Problems:   Acute encephalopathy  Acute encephalopathy: in the ER he was found to have a mild non-anion gap metabolic acidosis. IM is admitted who suspected that this may be from worsening renal function with loss of bicarbonate 2/2 to newly started lasix. Other etiologies could include elevated Lamictal level versus opioid-induced given worsening renal function. Wife administers medications and says that there have been no changes recently. No infectious etiology apparent given normal UA and chest x-ray. CT scan head without evidence of acute process. Ammonia normal, BUN normal. Lactic acid 0.55, glucose normal, hemoglobin slightly decreased at 10.5. Further work up per IM.   Intermittent bradycardia: HR mostly in 80s but intermittently dips into 30-40s sinus bradycardia. He was getting an ECHO done at beside which showed normal LV function and no  other gross abnormalities (pending final read). Likely secondary in nature vs primary. Will need to get MRI of head since CT with no acute abnormalities. Will obtain a TSH. Troponin is pending. We will continue to monitor closely. He has temp pacer pads placed. He is on no AVN  Blocking agents.   CAD: s/p CABG in 1999. Last cath 2005. He has been on a statin. Refused ASA and BB therapy in past. Recent low risk stress test 11/2015  HTN: BP controlled. No changes.   HLD: Lipids controlled. Continue statin.   Tobacco abuse, ongoing: he has been counseled about this extensively in the past and does not want to quit  Pulmonary nodule: CXR did show a pulmonary  nodule that will need further work up given his heavy smoking history.   Signed: Angelena Form, PA-C 12/28/2015 9:17 AM  Pager [phone]  Co-Sign MD  Patient examined chart reviewed. Appears to have sinus slowing with Ebbie Latus and more related To encephalopathy and ? CNS event.  Troponin negative no chest pain no acute ST changes and recent Stress test non ischemic. Cr ok, Ammonia ok, CT ok MRI pending.  Check TSH and ABG may end Up needing LP and EEG  if no structural problem identified on MRI and no toxic metabolic abnormality.  Tox screen is pending as well   Will follow on telemetry Bedside echo with normal EF no effusion and no bad valve Dx No vegetations Or SOE  Exam with encephalopathy SEM and poor inspiratory effort with occasional [place]

## 2015-12-28 NOTE — ED Provider Notes (Addendum)
[facility] DEPT Provider Note   CSN: [id number] Arrival date & time: 12/28/15  0539     History   Chief Complaint Chief Complaint  Patient presents with  . Altered Mental Status    HPI [name]. is a 75 y.o. male.  Patient brought to emergency department from home. Information obtained via EMS. EMS was told by wife that the patient has been confused and less active than normal for the last 2 days. He reportedly had a fall at home tonight precipitating her call to EMS. At arrival, patient is confused and somnolent, cannot provide any further information.  Level V Caveat due to mental status change      Past Medical History:  Diagnosis Date  . Allergy   . Anxiety   . Asthma   . Benign prostatic hypertrophy   . CAD (coronary artery disease)    a. s/p multiple PCIs to RCA and eventual CABG in 1999;  b.LHC 11/2003: CFX 95%, RCA occluded, SVG-RCA occluded, SVG-OM patent, native LAD patent. EF was 55%.  Patient had left to right collaterals and medical therapy was recommended    . COPD (chronic obstructive pulmonary disease) ([place])   . DDD (degenerative disc disease), lumbar   . Depression   . Diabetes mellitus    on no medications  . Diverticulitis, colon   . Emphysema of lung ([place])   . Esophageal stricture   . GERD (gastroesophageal reflux disease)   . Herniated disc   . Hiatal hernia   . History of colonic polyps   . HOH (hard of hearing)   . Hx of colonoscopy   . Hyperlipidemia   . Hypertension   . Insomnia   . Iron deficiency anemia   . Lumbar back pain    chronic  . Memory loss   . OA (osteoarthritis)   . OSA (obstructive sleep apnea)    wife denies this  . Pneumonia   . PVD (peripheral vascular disease) ([place])   . Shortness of breath dyspnea    on home O2  . Spinal stenosis     Patient Active Problem List   Diagnosis Date Noted  . H/O asbestos exposure 10/31/2015  . Abdominal pain, generalized   . Abnormal CT scan   . Loss of weight     . Chronic kidney disease 08/30/2015  . Lung nodule < 6cm on CT 08/29/2015  . Moderate protein-calorie malnutrition ([place]) 08/24/2015  . COPD with acute exacerbation ([place]) 08/24/2015  . Other secondary osteoarthritis of both shoulders 06/12/2015  . Rhinitis, allergic 06/12/2015  . COPD, severe ([place]) 05/10/2015  . Bradycardia 04/11/2015  . Insomnia 04/05/2015  . Risk for falls 04/05/2015  . Type 2 diabetes mellitus with circulatory disorder ([place]) 04/05/2015  . Onychomycosis 11/25/2014  . Tinea pedis 11/25/2014  . Acute low back pain 08/18/2013  . Chest pain, atypical 10/17/2012  . Chronic respiratory failure with hypoxia ([place]) 10/17/2012  . On home oxygen therapy 10/17/2012  . Hyperkalemia 10/17/2012  . Headache(784.0) 02/20/2012  . Weight loss 04/17/2011  . Bladder neck obstruction 01/17/2011  . Preventative health care 11/17/2010  . CONSTIPATION 06/12/2010  . FLATULENCE-GAS-BLOATING 06/12/2010  . SCIATICA, LEFT 05/31/2010  . PHIMOSIS 04/06/2009  . TOBACCO USER 01/17/2009  . DYSPHAGIA 07/25/2008  . Osteoarthritis 06/24/2008  . ANEMIA-IRON DEFICIENCY 05/20/2008  . Memory loss 05/18/2008  . DEGENERATIVE DISC DISEASE, LUMBAR SPINE 01/03/2008  . Anxiety state 06/24/2007  . DEPRESSION 06/24/2007  . BENIGN PROSTATIC HYPERTROPHY 06/24/2007  . HYPERSOMNIA  06/24/2007  . FATIGUE 06/24/2007  . Urinary hesitancy 06/24/2007  . COLONIC POLYPS 06/12/2007  . Diabetes ([place]) 06/12/2007  . HYPERLIPIDEMIA 06/12/2007  . Essential hypertension 06/12/2007  . ANGINA PECTORIS 06/12/2007  . Coronary atherosclerosis 06/12/2007  . PERIPHERAL VASCULAR DISEASE 06/12/2007  . ESOPHAGEAL STRICTURE 06/12/2007  . GERD 06/12/2007  . DIVERTICULOSIS, COLON 06/12/2007  . HERNIATED DISC 06/12/2007  . SPINAL STENOSIS 06/12/2007  . Lumbago 06/12/2007    Past Surgical History:  Procedure Laterality Date  . bilateral knee replacements    . CARPAL TUNNEL RELEASE    . COLONOSCOPY N/A 10/13/2015    Procedure: COLONOSCOPY;  Surgeon: Irene Shipper, MD;  Location: [facility] ENDOSCOPY;  Service: Endoscopy;  Laterality: N/A;  . CORONARY ANGIOPLASTY WITH STENT PLACEMENT  2/99  . CORONARY ARTERY BYPASS GRAFT  09/1997   double bypss  . heart bypass    . HEMORRHOID SURGERY    . SHOULDER ARTHROSCOPY  right  . STRABISMUS SURGERY  08/29/2011   Procedure: REPAIR STRABISMUS;  Surgeon: Derry Skill, MD;  Location: [place] [facility];  Service: Ophthalmology;  Laterality: Left;       Home Medications    Prior to Admission medications   Medication Sig Start Date End Date Taking? Authorizing Provider  albuterol (VENTOLIN HFA) 108 (90 BASE) MCG/ACT inhaler Inhale 2 puffs into the lungs every 6 (six) hours as needed for wheezing or shortness of breath. 04/07/14   Biagio Borg, MD  ALPRAZolam Duanne Moron) 0.25 MG tablet take 1 tablet by mouth twice a day if needed for anxiety or sleep 11/29/15   [name], DO  AMBULATORY NON FORMULARY MEDICATION 2.5 Liters home oxygen continuous    Historical Provider, MD  Aspirin-Salicylamide-Caffeine (BC HEADACHE POWDER PO) Take 1 Package by mouth 4 (four) times daily as needed (headache or pain).    Historical Provider, MD  donepezil (ARICEPT) 10 MG tablet Take 1 tablet (10 mg total) by mouth at bedtime. 10/17/15   [name], DO  DULoxetine (CYMBALTA) 30 MG capsule  01/24/14   Historical Provider, MD  DULoxetine (CYMBALTA) 60 MG capsule Take 1 capsule (60 mg total) by mouth daily. 10/09/15   [name], DO  fluticasone [facility]) 50 MCG/ACT nasal spray instill 2 sprays into each nostril once daily Patient taking differently: instill 2 sprays into each nostril once daily as needed 05/11/14   Biagio Borg, MD  furosemide (LASIX) 20 MG tablet Take 0.5 tablets (10 mg total) by mouth 2 (two) times daily. 12/21/15   [name], DO  HYDROcodone-acetaminophen (NORCO) 10-325 MG tablet take 1 tablet by mouth every 4 to 6 hours if needed for pain (NO MORE THAN 5 PER DAY) 06/08/15    Historical Provider, MD  lamoTRIgine (LAMICTAL) 25 MG tablet take 1 tablet at bedtime for 14 days then 1 tablet twice a day 11/14/14   Historical Provider, MD  Multiple Vitamin (MULTIVITAMIN) capsule Take 1 capsule by mouth daily.     Historical Provider, MD  nitroGLYCERIN (NITROSTAT) 0.4 MG SL tablet Place 1 tablet (0.4 mg total) under the tongue every 5 (five) minutes as needed. For chest pain 11/09/14   Burnell Blanks, MD  omeprazole (PRILOSEC) 20 MG capsule Take 1 capsule (20 mg total) by mouth 2 (two) times daily before a meal. 12/06/15   [name], DO  pravastatin (PRAVACHOL) 80 MG tablet take 1 tablet by mouth once daily 11/27/15   [name], DO  terbinafine (LAMISIL AT) 1 % cream Apply 1 application topically 2 (two)  times daily. 08/23/15   [name], DO  umeclidinium bromide (INCRUSE ELLIPTA) 62.5 MCG/INH AEPB Inhale 1 puff into the lungs daily. 08/23/15   [name], DO    Family History Family History  Problem Relation Age of Onset  . Stroke Father   . Emphysema Father   . Heart disease    . Hypertension    . Hyperlipidemia    . Allergies    . Diabetes    . Kidney cancer Daughter   . Breast cancer Daughter   . Colon cancer Neg Hx     Social History Social History  Substance Use Topics  . Smoking status: Current Every Day Smoker    Packs/day: 1.00    Years: 65.00    Types: Cigarettes    Start date: 02/14/1948  . Smokeless tobacco: Never Used     Comment: Peak rate of 3ppd  . Alcohol use No     Comment: none for 20 years      Allergies   Celecoxib; Chicken protein; Fish-derived products; Morphine; Nalbuphine; Prednisone; and Venlafaxine   Review of Systems Review of Systems  Unable to perform ROS: Mental status change     Physical Exam Updated Vital Signs BP 128/73   Pulse 87   Resp 18   Ht 5\' 11"  (1.803 m)   Wt 150 lb (68 kg)   SpO2 99%   BMI 20.92 kg/m   Physical Exam  Constitutional: He appears well-developed. He  appears lethargic.  HENT:  Head: Normocephalic and atraumatic.  Eyes: Pupils are equal, round, and reactive to light.  Neck: Neck supple.  Cardiovascular: Normal rate and regular rhythm.  Exam reveals no gallop and no friction rub.   No murmur heard. Pulmonary/Chest: He has decreased breath sounds. He has rhonchi. He has rales.  Abdominal: Soft.  Musculoskeletal: Normal range of motion. He exhibits no edema.  Neurological: He appears lethargic. GCS eye subscore is 3. GCS verbal subscore is 4. GCS motor subscore is 6.  Skin:  Mottling bilateral feet     ED Treatments / Results  Labs (all labs ordered are listed, but only abnormal results are displayed) Labs Reviewed  CBC WITH DIFFERENTIAL/PLATELET - Abnormal; Notable for the following:       Result Value   RBC 3.55 (*)    Hemoglobin 10.5 (*)    HCT 32.9 (*)    RDW 16.5 (*)    All other components within normal limits  COMPREHENSIVE METABOLIC PANEL - Abnormal; Notable for the following:    Chloride 113 (*)    CO2 16 (*)    Glucose, Bld 122 (*)    BUN 40 (*)    Creatinine, Ser 1.82 (*)    Calcium 8.4 (*)    Albumin 3.1 (*)    ALT 14 (*)    GFR calc non Af Amer 35 (*)    GFR calc Af Amer 41 (*)    All other components within normal limits  I-STAT ARTERIAL BLOOD GAS, ED - Abnormal; Notable for the following:    pH, Arterial 7.308 (*)    pO2, Arterial 113.0 (*)    Bicarbonate 16.1 (*)    Acid-base deficit 9.0 (*)    All other components within normal limits  CBG MONITORING, ED - Abnormal; Notable for the following:    Glucose-Capillary 116 (*)    All other components within normal limits  CULTURE, BLOOD (ROUTINE X 2)  CULTURE, BLOOD (ROUTINE X 2)  URINE CULTURE  BRAIN NATRIURETIC  PEPTIDE  URINALYSIS, ROUTINE W REFLEX MICROSCOPIC (NOT AT [facility])  AMMONIA  ETHANOL  SALICYLATE LEVEL  ACETAMINOPHEN LEVEL  URINE RAPID DRUG SCREEN, HOSP PERFORMED  I-STAT CG4 LACTIC ACID, ED  Randolm Idol, ED    EKG  EKG  Interpretation  Date/Time:  Thursday December 28 2015 06:54:54 EDT Ventricular Rate:  40 PR Interval:    QRS Duration: 114 QT Interval:  432 QTC Calculation: 353 R Axis:   49 Text Interpretation:  Sinus bradycardia Short PR interval Borderline intraventricular conduction delay Confirmed by Betsey Holiday  MD, Reilynn Lauro [phone]) on 12/28/2015 7:03:33 AM       Radiology Ct Head Wo Contrast  Result Date: 12/28/2015 CLINICAL DATA:  Fall.  Mental status change. EXAM: CT HEAD WITHOUT CONTRAST CT CERVICAL SPINE WITHOUT CONTRAST TECHNIQUE: Multidetector CT imaging of the head and cervical spine was performed following the standard protocol without intravenous contrast. Multiplanar CT image reconstructions of the cervical spine were also generated. COMPARISON:  MRI head 11/24/2010 FINDINGS: CT HEAD FINDINGS Brain: Ventricle size normal. Cerebral volume normal. Negative for acute infarct, hemorrhage, mass lesion. No edema or shift of the midline structures. Vascular: No hyperdense vessel or unexpected calcification. Skull: Normal. Negative for fracture or focal lesion. Sinuses/Orbits: Mild mucosal edema paranasal sinuses. Other: None CT CERVICAL SPINE FINDINGS Alignment: Normal Skull base and vertebrae: Negative for fracture or mass lesion. No acute skeletal abnormality Soft tissues and spinal canal: Negative for soft tissue mass. Atherosclerotic calcification in the carotid bifurcation bilaterally Disc levels: Disc degeneration and moderate spurring C5-6 and C6-7. This contributes to spinal stenosis. Right foraminal narrowing C5-6 and C6-7 due to spurring. Moderate left foraminal narrowing C6-7 due to spurring. Upper chest: Apical emphysema without mass lesion. Other: None IMPRESSION: No acute intracranial abnormality Cervical spondylosis.  Negative for cervical spine fracture. Electronically Signed   By: Franchot Gallo M.D.   On: 12/28/2015 07:20   Ct Cervical Spine Wo Contrast  Result Date:  12/28/2015 CLINICAL DATA:  Fall.  Mental status change. EXAM: CT HEAD WITHOUT CONTRAST CT CERVICAL SPINE WITHOUT CONTRAST TECHNIQUE: Multidetector CT imaging of the head and cervical spine was performed following the standard protocol without intravenous contrast. Multiplanar CT image reconstructions of the cervical spine were also generated. COMPARISON:  MRI head 11/24/2010 FINDINGS: CT HEAD FINDINGS Brain: Ventricle size normal. Cerebral volume normal. Negative for acute infarct, hemorrhage, mass lesion. No edema or shift of the midline structures. Vascular: No hyperdense vessel or unexpected calcification. Skull: Normal. Negative for fracture or focal lesion. Sinuses/Orbits: Mild mucosal edema paranasal sinuses. Other: None CT CERVICAL SPINE FINDINGS Alignment: Normal Skull base and vertebrae: Negative for fracture or mass lesion. No acute skeletal abnormality Soft tissues and spinal canal: Negative for soft tissue mass. Atherosclerotic calcification in the carotid bifurcation bilaterally Disc levels: Disc degeneration and moderate spurring C5-6 and C6-7. This contributes to spinal stenosis. Right foraminal narrowing C5-6 and C6-7 due to spurring. Moderate left foraminal narrowing C6-7 due to spurring. Upper chest: Apical emphysema without mass lesion. Other: None IMPRESSION: No acute intracranial abnormality Cervical spondylosis.  Negative for cervical spine fracture. Electronically Signed   By: Franchot Gallo M.D.   On: 12/28/2015 07:20   Dg Chest Port 1 View  Result Date: 12/28/2015 CLINICAL DATA:  Shortness of breath. EXAM: PORTABLE CHEST 1 VIEW COMPARISON:  CT chest 11/02/2015.  Chest 08/23/2015. FINDINGS: Postoperative changes in the mediastinum. Heart size and pulmonary vascularity are normal. Emphysematous changes in the lungs. Peribronchial thickening with central interstitial changes consistent with chronic bronchitis.  Increased opacity in the right lung apex may represent pleural thickening or  fluid. This is new since previous study. Degenerative changes in the shoulders. IMPRESSION: Emphysematous changes and bronchitic changes in the lungs. New opacification of the right lung apex could represent pleural thickening or fluid. Degenerative changes in the shoulders. Electronically Signed   By: Lucienne Capers M.D.   On: 12/28/2015 06:18    Procedures Procedures (including critical care time)  Medications Ordered in ED Medications - No data to display   Initial Impression / Assessment and Plan / ED Course  I have reviewed the triage vital signs and the nursing notes.  Pertinent labs & imaging results that were available during my care of the patient were reviewed by me and considered in my medical decision making (see chart for details).  Clinical Course   Patient presents to the ER from home by ambulance. Patient's wife called EMS because he has had a 2 day history of sleeping continuously and confusion. He has had trouble ambulating because of generalized weakness and did have a fall. At arrival patient awakens to voice and follows commands but is very somnolent. His workup has been unremarkable. EKG unremarkable. CBC unremarkable. Answer metabolic panel did show elevated BUN and creatinine. He does have a history of renal insufficiency and has recently started seeing a nephrologist. Lasix was added because of elevated potassiums. This is his only new medication. He does take Xanax and Norco. Wife does not think he has overdosed.  He did have some evidence of non-anion gap metabolic acidosis. He has not had any recent diarrhea.  CT head and cervical spine performed for his altered mental status and history of fall. There was no acute abnormality.  Patient did have brief episodes of bradycardia during transport and upon arrival to the ER. Heart rate would drop into the 30s and 40s briefly and then return to normal. Left does report that normally he is bradycardic. She is unaware of  any specific arrhythmia diagnosis, however.  Patient continues to be somnolent here in the ER. He will require hospitalization for further evaluation and management.  Final Clinical Impressions(s) / ED Diagnoses   Final diagnoses:  Acute encephalopathy  Bradycardia    New Prescriptions New Prescriptions   No medications on file     Orpah Greek, MD 12/28/15 [place], MD 12/28/15 1601

## 2015-12-28 NOTE — ED Notes (Signed)
Patient transported to MRI 

## 2015-12-29 DIAGNOSIS — F17200 Nicotine dependence, unspecified, uncomplicated: Secondary | ICD-10-CM

## 2015-12-29 LAB — COMPREHENSIVE METABOLIC PANEL
ALK PHOS: 64 U/L (ref 38–126)
ALT: 12 U/L — AB (ref 17–63)
ANION GAP: 9 (ref 5–15)
AST: 22 U/L (ref 15–41)
Albumin: 2.5 g/dL — ABNORMAL LOW (ref 3.5–5.0)
BILIRUBIN TOTAL: 0.5 mg/dL (ref 0.3–1.2)
BUN: 21 mg/dL — ABNORMAL HIGH (ref 6–20)
CALCIUM: 8.4 mg/dL — AB (ref 8.9–10.3)
CO2: 21 mmol/L — AB (ref 22–32)
CREATININE: 1.14 mg/dL (ref 0.61–1.24)
Chloride: 111 mmol/L (ref 101–111)
Glucose, Bld: 88 mg/dL (ref 65–99)
Potassium: 3.5 mmol/L (ref 3.5–5.1)
SODIUM: 141 mmol/L (ref 135–145)
TOTAL PROTEIN: 5.3 g/dL — AB (ref 6.5–8.1)

## 2015-12-29 LAB — CBC
HEMATOCRIT: 28.7 % — AB (ref 39.0–52.0)
HEMATOCRIT: 28.8 % — AB (ref 39.0–52.0)
HEMOGLOBIN: 8.9 g/dL — AB (ref 13.0–17.0)
Hemoglobin: 9 g/dL — ABNORMAL LOW (ref 13.0–17.0)
MCH: 28.8 pg (ref 26.0–34.0)
MCH: 29 pg (ref 26.0–34.0)
MCHC: 31 g/dL (ref 30.0–36.0)
MCHC: 31.3 g/dL (ref 30.0–36.0)
MCV: 92.9 fL (ref 78.0–100.0)
MCV: 92.9 fL (ref 78.0–100.0)
PLATELETS: 255 10*3/uL (ref 150–400)
Platelets: 253 10*3/uL (ref 150–400)
RBC: 3.09 MIL/uL — AB (ref 4.22–5.81)
RBC: 3.1 MIL/uL — ABNORMAL LOW (ref 4.22–5.81)
RDW: 16.4 % — ABNORMAL HIGH (ref 11.5–15.5)
RDW: 16.2 % — AB (ref 11.5–15.5)
WBC: 5.4 10*3/uL (ref 4.0–10.5)
WBC: 5.3 10*3/uL (ref 4.0–10.5)

## 2015-12-29 LAB — HIV ANTIBODY (ROUTINE TESTING W REFLEX): HIV SCREEN 4TH GENERATION: NONREACTIVE

## 2015-12-29 LAB — RPR: RPR Ser Ql: NONREACTIVE

## 2015-12-29 LAB — LAMOTRIGINE LEVEL: Lamotrigine Lvl: 2.1 ug/mL (ref 2.0–20.0)

## 2015-12-29 LAB — URINE CULTURE: Culture: NO GROWTH

## 2015-12-29 LAB — STREP PNEUMONIAE URINARY ANTIGEN: Strep Pneumo Urinary Antigen: NEGATIVE

## 2015-12-29 LAB — GLUCOSE, CAPILLARY
Glucose-Capillary: 98 mg/dL (ref 65–99)
Glucose-Capillary: 132 mg/dL — ABNORMAL HIGH (ref 65–99)

## 2015-12-29 MED ORDER — HYDROCODONE-ACETAMINOPHEN 10-325 MG PO TABS: 0.5000 | ORAL_TABLET | Freq: Three times a day (TID) | ORAL | Status: DC | PRN

## 2015-12-29 MED ORDER — ZOLPIDEM TARTRATE 10 MG PO TABS: 5.0000 mg | ORAL_TABLET | Freq: Every evening | ORAL | Status: AC | PRN

## 2015-12-29 NOTE — Telephone Encounter (Signed)
Can call her back - Thanks for the heads-up! I can follow what's going on through his chart, I'll keep an eye on things. If she or the patient have any questions or if the hospital doctors need to contact me just let me know.

## 2015-12-29 NOTE — Discharge Summary (Addendum)
Physician Discharge Summary  [name] ERX:[id number] DOB: 1940/07/15  PCP: [name], DO  Admit date: 12/28/2015 Discharge date: 12/29/2015  Admitted From: Home Disposition:  Home  Recommendations for Outpatient Follow-up:  1. Dr. [name], PCP in One week with repeat labs (CBC & BMP) 2. Dr. Lauree Chandler, Cardiology: MDs office will call patient with appointment. 3. Dr. Tera Partridge, Pulmonology in 2 weeks  Home Health: PT for safety evaluation. Equipment/Devices: None    Discharge Condition: Improved and stable  CODE STATUS: Limited code. DO NOT INTUBATE Diet recommendation: Heart healthy diet.  Discharge Diagnoses:  Principal Problem:   Acute encephalopathy Active Problems:   TOBACCO USER   Type 2 diabetes mellitus with circulatory disorder (HCC)   Bradycardia   Chronic kidney disease   Lung nodule < 6cm on CT   Diabetes mellitus with complication (HCC)   Hx of CABG   Dementia   Brief/Interim Summary: 75 year old male, lives with spouse, hard of hearing, does not ambulate much except from bed or chair to bathroom, on home oxygen 2.5 L/m at bedtime, ongoing tobacco abuse, PMH of MI, CAD, CABG, oxygen dependent COPD, depression, anxiety, diet-controlled DM, GERD, HLD, HTN, presented with 2 days history of lethargy and confusion. History obtained initially from spouse. No history of alcohol intake. Recently started on Lasix but otherwise no medication changes. Medications administered by wife due to patient's memory loss related to his dementia. Apparently goes to a pain clinic for his narcotics. He was admitted for further evaluation and management.  Assessment and plan:  Acute encephalopathy - Possibly multifactorial related to dehydration, acute on chronic kidney disease and polypharmacy (opioid pain medications, benzodiazepines, Lamictal) complicating underlying hearing loss and dementia. - Extensively worked up: Urine microscopy  negative for UTI features, chest x-ray without pneumonia, CT head without acute process, MRI and normal for age, ammonia normal, lactate normal. - Patient was hospitalized and started on IV fluids. - As per spouse's report today, patient's mental status is back to baseline. PT has evaluated and recommend home health PT for safety evaluation. - Given advanced age, early dementia, risk of recurrent confusion and falls from polypharmacy, have reduced opioid dose which can be further assessed during outpatient follow-up with pain management, reduced Ambien. Did not make changes to benzodiazepines or Lyrica which can again be addressed during outpatient follow-up with PCP or pain M.D.  Bradycardia - Patient's heart rate was fluctuating between 30s-80s in ED. He has history of MI/CAD status post CABG. Nuclear stress test a month ago essentially normal. EKG showed normal sinus rhythm without acute findings. - Cardiology consultation and follow-up appreciated. They indicated that patient appears to have sinus slowing with Chyne Stokes and more related to encephalopathy. Bedside echo with normal EF, no effusion and no bad valves. - Cardiology has seen him today and indicates that his cardiac status is stable, stop Aricept given pauses and slow heart rate and they will arrange outpatient follow-up with his primary cardiologist and cleared for discharge home.  Abnormal CT chest/right apical lung opacification - Patient continues to smoke. - CT from July showed right apical 5 mm nodule than scattered tree in bud densities in the lung. - Repeat CT chest 12/28/15: Detailed report as below. No suspicious nodularity for lung carcinoma. Stable branching nodular pattern in the left upper lobe and new branching nodular pattern in the right lower lobe. Reported as findings consistent with chronic/recurrent/migrated tree infectious process and consider Mycobacterium avium intracellulare infection. - Infectious disease was  consulted and indicated that he had only one of the criteria for MAI (radiographic) and needed microbiologic confirmation with at least 2 positive sputum's or a positive BAL. He can follow up outpatient. - Recommend outpatient follow-up with his primary pulmonologist.  Acute on chronic kidney disease - Baseline creatinine apparently 1.42. Presented with creatinine of 1.82. Likely secondary to poor oral intake and new diuretic use. Diuretics were held. Briefly hydrate with IV fluids. Creatinine has normalized. Patient is clinically euvolemic. Recommend holding off on restarting Lasix until outpatient follow-up with his PCP.  Diet controlled DM - Hemoglobin A1c: 6.1.   COPD/chronic respiratory failure with hypoxia - Stable. - Ongoing tobacco abuse. Cessation counseled.  Chronic pain - Management as discussed above.  GERD - PPI  Dementia/depression/anxiety - Medication discussion as above. Aricept discontinued due to concern for bradycardia.  Hyperlipidemia - Statins.    Anemia  - No bleeding reported. Baseline hemoglobin? 11 g range. Approximately 2 g drop which may be dilutional but asymptomatic and stable after repeat today. Outpatient follow-up with repeat CBC.   Essential hypertension - Controlled.  CAD status post CABG - Asymptomatic of chest pain.    Discharge Instructions  Discharge Instructions    Call MD for:    Complete by:  As directed    Recurrent or worsening confusion.   Call MD for:  difficulty breathing, headache or visual disturbances    Complete by:  As directed    Call MD for:  extreme fatigue    Complete by:  As directed    Call MD for:  persistant dizziness or light-headedness    Complete by:  As directed    Diet - low sodium heart healthy    Complete by:  As directed    Increase activity slowly    Complete by:  As directed        Medication List    STOP taking these medications   BC HEADACHE POWDER PO   donepezil 10 MG tablet Commonly  known as:  ARICEPT   fluticasone 50 MCG/ACT nasal spray Commonly known as:  FLONASE   furosemide 20 MG tablet Commonly known as:  LASIX   umeclidinium bromide 62.5 MCG/INH Aepb Commonly known as:  INCRUSE ELLIPTA     TAKE these medications   albuterol 108 (90 Base) MCG/ACT inhaler Commonly known as:  VENTOLIN HFA Inhale 2 puffs into the lungs every 6 (six) hours as needed for wheezing or shortness of breath.   ALPRAZolam 0.25 MG tablet Commonly known as:  XANAX take 1 tablet by mouth twice a day if needed for anxiety or sleep   b complex-vitamin c-folic acid 0.8 MG Tabs tablet Take 1 tablet by mouth daily.   DULoxetine 30 MG capsule Commonly known as:  CYMBALTA Take 90 mg by mouth every morning.   HYDROcodone-acetaminophen 10-325 MG tablet Commonly known as:  NORCO Take 0.5 tablets by mouth every 8 (eight) hours as needed for severe pain. What changed:  See the new instructions.   lamoTRIgine 100 MG tablet Commonly known as:  LAMICTAL Take 50-100 mg by mouth 2 (two) times daily. 50mg  in the morning, and 100mg  in the evening   nitroGLYCERIN 0.4 MG SL tablet Commonly known as:  NITROSTAT Place 1 tablet (0.4 mg total) under the tongue every 5 (five) minutes as needed. For chest pain   omeprazole 20 MG capsule Commonly known as:  PRILOSEC Take 1 capsule (20 mg total) by mouth 2 (two) times daily before a meal.  OXYGEN Inhale 2.5 L into the lungs at bedtime.   pravastatin 80 MG tablet Commonly known as:  PRAVACHOL take 1 tablet by mouth once daily What changed:  See the new instructions.   terbinafine 1 % cream Commonly known as:  LAMISIL AT Apply 1 application topically 2 (two) times daily.   zolpidem 10 MG tablet Commonly known as:  AMBIEN Take 0.5 tablets (5 mg total) by mouth at bedtime as needed for sleep. What changed:  how much to take      [place] .   Why:  Home health PT arranged Contact  information: [address] [place] [address] [phone]        [name], DO. Schedule an appointment as soon as possible for a visit in 1 week(s).   Specialty:  Osteopathic Medicine Why:  To be seen with repeat labs (CBC & BMP). Contact information: [address] [place] [address] [phone]        Lauree Chandler, MD .   Specialty:  Cardiology Why:  MD's office will call you with appointment. Please call them back if you dont hear back from them in 3 business days. Contact information: [place] [address] [place] [address] [phone]        Tera Partridge, MD. Schedule an appointment as soon as possible for a visit in 2 week(s).   Specialty:  Pulmonary Disease Contact information: [address] [place] [address] [phone]          Allergies  Allergen Reactions  . Celecoxib Nausea And Vomiting    REACTION: GI upset  . Chicken Protein Other (See Comments)    Will not eat chicken  . Fish-Derived Products Other (See Comments)    Will not eat seafood of any kind  . Morphine Itching    REACTION: itching  . Nalbuphine Other (See Comments)    REACTION: contraindication with oxycontin.  . Prednisone Other (See Comments)    REACTION: "yeast infections"  . Venlafaxine Other (See Comments)    REACTION: GI upset    Consultations:  Infectious disease   Cardiology    Procedures/Studies: Ct Head Wo Contrast  Result Date: 12/28/2015 CLINICAL DATA:  Fall.  Mental status change. EXAM: CT HEAD WITHOUT CONTRAST CT CERVICAL SPINE WITHOUT CONTRAST TECHNIQUE: Multidetector CT imaging of the head and cervical spine was performed following the standard protocol without intravenous contrast. Multiplanar CT image reconstructions of the cervical spine were also generated. COMPARISON:  MRI head 11/24/2010 FINDINGS: CT HEAD FINDINGS Brain: Ventricle size normal. Cerebral volume normal. Negative for  acute infarct, hemorrhage, mass lesion. No edema or shift of the midline structures. Vascular: No hyperdense vessel or unexpected calcification. Skull: Normal. Negative for fracture or focal lesion. Sinuses/Orbits: Mild mucosal edema paranasal sinuses. Other: None CT CERVICAL SPINE FINDINGS Alignment: Normal Skull base and vertebrae: Negative for fracture or mass lesion. No acute skeletal abnormality Soft tissues and spinal canal: Negative for soft tissue mass. Atherosclerotic calcification in the carotid bifurcation bilaterally Disc levels: Disc degeneration and moderate spurring C5-6 and C6-7. This contributes to spinal stenosis. Right foraminal narrowing C5-6 and C6-7 due to spurring. Moderate left foraminal narrowing C6-7 due to spurring. Upper chest: Apical emphysema without mass lesion. Other: None IMPRESSION: No acute intracranial abnormality Cervical spondylosis.  Negative for cervical spine fracture. Electronically Signed   By: Franchot Gallo M.D.   On: 12/28/2015 07:20   Ct  Chest Wo Contrast  Result Date: 12/28/2015 CLINICAL DATA:  Pt is sob, has chest pain, exam done for lung nodule EXAM: CT CHEST WITHOUT CONTRAST TECHNIQUE: Multidetector CT imaging of the chest was performed following the standard protocol without IV contrast. COMPARISON:  CT 04/21/2014 FINDINGS: Cardiovascular: Coronary artery calcification and aortic atherosclerotic calcification. Mediastinum/Nodes: No axillary or supraclavicular adenopathy. Small mediastinal lymph nodes are numerous the not pathologically enlarged and unchanged from comparison exam. Largest example nodule measures 9 mm in the RIGHT lower paratracheal nodal station. Large hiatal hernia again demonstrated. Lungs/Pleura: Again demonstrated branching nodular pattern within the medial aspect of the LEFT upper lobe not changed in pattern from 11/02/2015. Small angular nodule in the RIGHT upper lobe is difficult to identify on current scan. No new pulmonary nodules.  There is additionally, new branching nodular pattern (so-called tree-in-bud) within the RIGHT lower lobe which is increased from comparison exam. Mild bronchial thickening is present. Upper Abdomen: Limited view of the liver, kidneys, pancreas are unremarkable. Normal adrenal glands. Musculoskeletal: No aggressive osseous lesion. IMPRESSION: 1. No suspicious nodularity for lung carcinoma. Recommend continued CT lung screening per recommendations. 2. Stable branching nodular pattern in the LEFT upper lobe and new branching nodular pattern in the RIGHT lower lobe. These findings are moat consistent with chronic / recurrent / migratory infectious process. Consider mycobacterium avium intracellulare infection. 3. Stable prominent mediastinal lymph nodes. Electronically Signed   By: Suzy Bouchard M.D.   On: 12/28/2015 10:50   Ct Cervical Spine Wo Contrast  Result Date: 12/28/2015 CLINICAL DATA:  Fall.  Mental status change. EXAM: CT HEAD WITHOUT CONTRAST CT CERVICAL SPINE WITHOUT CONTRAST TECHNIQUE: Multidetector CT imaging of the head and cervical spine was performed following the standard protocol without intravenous contrast. Multiplanar CT image reconstructions of the cervical spine were also generated. COMPARISON:  MRI head 11/24/2010 FINDINGS: CT HEAD FINDINGS Brain: Ventricle size normal. Cerebral volume normal. Negative for acute infarct, hemorrhage, mass lesion. No edema or shift of the midline structures. Vascular: No hyperdense vessel or unexpected calcification. Skull: Normal. Negative for fracture or focal lesion. Sinuses/Orbits: Mild mucosal edema paranasal sinuses. Other: None CT CERVICAL SPINE FINDINGS Alignment: Normal Skull base and vertebrae: Negative for fracture or mass lesion. No acute skeletal abnormality Soft tissues and spinal canal: Negative for soft tissue mass. Atherosclerotic calcification in the carotid bifurcation bilaterally Disc levels: Disc degeneration and moderate spurring C5-6  and C6-7. This contributes to spinal stenosis. Right foraminal narrowing C5-6 and C6-7 due to spurring. Moderate left foraminal narrowing C6-7 due to spurring. Upper chest: Apical emphysema without mass lesion. Other: None IMPRESSION: No acute intracranial abnormality Cervical spondylosis.  Negative for cervical spine fracture. Electronically Signed   By: Franchot Gallo M.D.   On: 12/28/2015 07:20   Mr Brain Wo Contrast  Result Date: 12/28/2015 CLINICAL DATA:  Lethargy and altered mental status. EXAM: MRI HEAD WITHOUT CONTRAST TECHNIQUE: Multiplanar, multiecho pulse sequences of the brain and surrounding structures were obtained without intravenous contrast. COMPARISON:  Head CT 12/28/2015 FINDINGS: Brain: The The midline structures are normal. No focal parenchymal signal abnormality. No mass lesion or midline shift. No hydrocephalus or extra-axial fluid collection. Vascular: Prominent intracranial left ICA flow void likely mild ectasia. The major flow voids are preserved. The. The No evidence of chronic microhemorrhage or amyloid angiopathy. Skull and upper cervical spine: The visualized skull base, calvarium, upper cervical spine and extracranial soft tissues are normal. Sinuses/Orbits: No fluid levels or advanced mucosal thickening. No mastoid effusion. Normal orbits. IMPRESSION: Normal  brain MRI for age. Electronically Signed   By: Ulyses Jarred M.D.   On: 12/28/2015 13:29   Dg Chest Port 1 View  Result Date: 12/28/2015 CLINICAL DATA:  Shortness of breath. EXAM: PORTABLE CHEST 1 VIEW COMPARISON:  CT chest 11/02/2015.  Chest 08/23/2015. FINDINGS: Postoperative changes in the mediastinum. Heart size and pulmonary vascularity are normal. Emphysematous changes in the lungs. Peribronchial thickening with central interstitial changes consistent with chronic bronchitis. Increased opacity in the right lung apex may represent pleural thickening or fluid. This is new since previous study. Degenerative changes in  the shoulders. IMPRESSION: Emphysematous changes and bronchitic changes in the lungs. New opacification of the right lung apex could represent pleural thickening or fluid. Degenerative changes in the shoulders. Electronically Signed   By: Lucienne Capers M.D.   On: 12/28/2015 06:18      Subjective: Patient denies complaints. No dyspnea, chest pain or any other complaints reported. Anxious to go home. As per spouse, mental status is back to baseline.   Discharge Exam:  Vitals:   12/28/15 1430 12/28/15 2130 12/29/15 0546 12/29/15 1336  BP: (!) 142/52 109/64 (!) 106/32 (!) 113/57  Pulse: 83 86 (!) 57 (!) 48  Resp:  18 17 18   Temp:  98.1 F (36.7 C) 99.8 F (37.7 C) 98.1 F (36.7 C)  TempSrc:  Oral Oral Oral  SpO2: 100% 96% 93% 93%  Weight:      Height:        General: Pt lying comfortably in bed & appears in no obvious distress.Sitting up comfortably in bed. Spouse at bedside.  Cardiovascular: S1 & S2 heard, RRR, S1/S2 +. No murmurs, rubs, gallops or clicks. No JVD or pedal edema. telemetry: Sinus bradycardia in the 50s-sinus rhythm.  Respiratory: Clear to auscultation without wheezing, rhonchi or crackles. No increased work of breathing. Abdominal:  Non distended, non tender & soft. No organomegaly or masses appreciated. Normal bowel sounds heard. CNS: Alert and oriented. No focal deficits. Hard of hearing.  Extremities: no edema, no cyanosis    The results of significant diagnostics from this hospitalization (including imaging, microbiology, ancillary and laboratory) are listed below for reference.     Microbiology: Recent Results (from the past 240 hour(s))  Urine culture     Status: None   Collection Time: 12/28/15  7:21 AM  Result Value Ref Range Status   Specimen Description URINE, CLEAN CATCH  Final   Special Requests NONE  Final   Culture NO GROWTH  Final   Report Status 12/29/2015 FINAL  Final     Labs: BNP (last 3 results)  Recent Labs  12/28/15 0555  BNP  88.4   Basic Metabolic Panel:  Recent Labs Lab 12/28/15 0555 12/28/15 1317 12/29/15 0650  NA 140 146* 141  K 4.0 4.3 3.5  CL 113* 115* 111  CO2 16* 21* 21*  GLUCOSE 122* 97 88  BUN 40* 31* 21*  CREATININE 1.82* 1.48* 1.14  CALCIUM 8.4* 8.7* 8.4*   Liver Function Tests:  Recent Labs Lab 12/28/15 0555 12/29/15 0650  AST 27 22  ALT 14* 12*  ALKPHOS 82 64  BILITOT 0.5 0.5  PROT 6.6 5.3*  ALBUMIN 3.1* 2.5*   No results for input(s): LIPASE, AMYLASE in the last 168 hours.  Recent Labs Lab 12/28/15 0555  AMMONIA 21   CBC:  Recent Labs Lab 12/28/15 0555 12/29/15 0650 12/29/15 1446  WBC 7.6 5.4 5.3  NEUTROABS 5.2  --   --   HGB 10.5* 8.9*  9.0*  HCT 32.9* 28.7* 28.8*  MCV 92.7 92.9 92.9  PLT 314 253 255   Cardiac Enzymes:  Recent Labs Lab 12/28/15 0835 12/28/15 1604  TROPONINI <0.03 <0.03   BNP: Invalid input(s): POCBNP CBG:  Recent Labs Lab 12/28/15 1521 12/28/15 1725 12/28/15 2039 12/29/15 0810 12/29/15 1157  GLUCAP 93 95 172* 98 132*   D-Dimer No results for input(s): DDIMER in the last 72 hours. Hgb A1c No results for input(s): HGBA1C in the last 72 hours. Lipid Profile No results for input(s): CHOL, HDL, LDLCALC, TRIG, CHOLHDL, LDLDIRECT in the last 72 hours. Thyroid function studies  Recent Labs  12/28/15 0835  TSH 0.701   Anemia work up No results for input(s): VITAMINB12, FOLATE, FERRITIN, TIBC, IRON, RETICCTPCT in the last 72 hours. Urinalysis    Component Value Date/Time   COLORURINE YELLOW 12/28/2015 0721   APPEARANCEUR CLEAR 12/28/2015 0721   LABSPEC 1.016 12/28/2015 0721   PHURINE 5.0 12/28/2015 0721   GLUCOSEU NEGATIVE 12/28/2015 0721   GLUCOSEU NEGATIVE 12/03/2013 1445   HGBUR NEGATIVE 12/28/2015 0721   BILIRUBINUR NEGATIVE 12/28/2015 0721   KETONESUR NEGATIVE 12/28/2015 0721   PROTEINUR NEGATIVE 12/28/2015 0721   UROBILINOGEN 0.2 12/03/2013 1445   NITRITE NEGATIVE 12/28/2015 0721   LEUKOCYTESUR NEGATIVE  12/28/2015 0721      Time coordinating discharge: Over 30 minutes  SIGNED:  Vernell Leep, MD, FACP, FHM. [facility] Pager [phone]  If 7PM-7AM, please contact night-coverage [email or web address] Password [facility] 12/29/2015, 3:52 PM    Addendum  In response to a CDI query:  Cannot be certain of type of CKD, but maybe CKD 2 or 3.  Vernell Leep, MD, FACP, FHM. [facility] Pager [phone]  If 7PM-7AM, please contact night-coverage [email or web address] Password [facility] 01/12/2016, 6:35 PM

## 2015-12-29 NOTE — Discharge Instructions (Signed)
Confusion Confusion is the inability to think with your usual speed or clarity. Confusion may come on quickly or slowly over time. How quickly the confusion comes on depends on the cause. Confusion can be due to any number of causes. CAUSES   Concussion, head injury, or head trauma.  Seizures.  Stroke.  Fever.  Brain tumor.  Age related decreased brain function (dementia).  Heightened emotional states like rage or terror.  Mental illness in which the person loses the ability to determine what is real and what is not (hallucinations).  Infections such as a urinary tract infection (UTI).  Toxic effects from alcohol, drugs, or prescription medicines.  Dehydration and an imbalance of salts in the body (electrolytes).  Lack of sleep.  Low blood sugar (diabetes).  Low levels of oxygen from conditions such as chronic lung disorders.  Drug interactions or other medicine side effects.  Nutritional deficiencies, especially niacin, thiamine, vitamin C, or vitamin B.  Sudden drop in body temperature (hypothermia).  Change in routine, such as when traveling or hospitalized. SIGNS AND SYMPTOMS  People often describe their thinking as cloudy or unclear when they are confused. Confusion can also include feeling disoriented. That means you are unaware of where or who you are. You may also not know what the date or time is. If confused, you may also have difficulty paying attention, remembering, and making decisions. Some people also act aggressively when they are confused.  DIAGNOSIS  The medical evaluation of confusion may include:  Blood and urine tests.  X-rays.  Brain and nervous system tests.  Analyzing your brain waves (electroencephalogram or EEG).  Magnetic resonance imaging (MRI) of your head.  Computed tomography (CT) scan of your head.  Mental status tests in which your health care provider may ask many questions. Some of these questions may seem silly or strange,  but they are a very important test to help diagnose and treat confusion. TREATMENT  An admission to the hospital may not be needed, but a person with confusion should not be left alone. Stay with a family member or friend until the confusion clears. Avoid alcohol, pain relievers, or sedative drugs until you have fully recovered. Do not drive until directed by your health care provider. HOME CARE INSTRUCTIONS  What family and friends can do:  To find out if someone is confused, ask the person to state his or her name, age, and the date. If the person is unsure or answers incorrectly, he or she is confused.  Always introduce yourself, no matter how well the person knows you.  Often remind the person of his or her location.  Place a calendar and clock near the confused person.  Help the person with his or her medicines. You may want to use a pill box, an alarm as a reminder, or give the person each dose as prescribed.  Talk about current events and plans for the day.  Try to keep the environment calm, quiet, and peaceful.  Make sure the person keeps follow-up visits with his or her health care provider. PREVENTION  Ways to prevent confusion:  Avoid alcohol.  Eat a balanced diet.  Get enough sleep.  Take medicine only as directed by your health care provider.  Do not become isolated. Spend time with other people and make plans for your days.  Keep careful watch on your blood sugar levels if you are diabetic. SEEK IMMEDIATE MEDICAL CARE IF:   You develop severe headaches, repeated vomiting, seizures, blackouts, or   slurred speech.  There is increasing confusion, weakness, numbness, restlessness, or personality changes.  You develop a loss of balance, have marked dizziness, feel uncoordinated, or fall.  You have delusions, hallucinations, or develop severe anxiety.  Your family members think you need to be rechecked.   This information is not intended to replace advice given  to you by your health care provider. Make sure you discuss any questions you have with your health care provider.   Document Released: 05/02/2004 Document Revised: 04/15/2014 Document Reviewed: 04/30/2013 Elsevier Interactive Patient Education 2016 [employer]  

## 2015-12-29 NOTE — Progress Notes (Signed)
[name] to be D/C'd to home with home health PT per MD order.  Discussed with the patient and all questions fully answered.  VSS, Skin clean, dry and intact without evidence of skin break down, no evidence of skin tears noted. IV catheter discontinued intact. Site without signs and symptoms of complications. Dressing and pressure applied.  An After Visit Summary was printed and given to the patient.   D/c education completed with patient/family including follow up instructions, medication list, d/c activities limitations if indicated, with other d/c instructions as indicated by MD - patient able to verbalize understanding, all questions fully answered.   Patient instructed to return to ED, call 911, or call MD for any changes in condition.   Patient escorted via [place], and D/C home via private auto.  Morley Kos Price 12/29/2015 4:31 PM

## 2015-12-29 NOTE — Progress Notes (Signed)
Patient ID: [name]., male   DOB: 17-May-1940, 75 y.o.   MRN: [id number]    Subjective:  Denies SSCP, palpitations or Dyspnea Back to normal MS wants to go home   Objective:  Vitals:   12/28/15 1401 12/28/15 1430 12/28/15 2130 12/29/15 0546  BP: 129/90 (!) 142/52 109/64 (!) 106/32  Pulse: 77 83 86 (!) 57  Resp: 24  18 17   Temp:   98.1 F (36.7 C) 99.8 F (37.7 C)  TempSrc:   Oral Oral  SpO2: 100% 100% 96% 93%  Weight:      Height:        Intake/Output from previous day:  Intake/Output Summary (Last 24 hours) at 12/29/15 9518 Last data filed at 12/29/15 8416  Gross per 24 hour  Intake             2220 ml  Output                0 ml  Net             2220 ml    Physical Exam: Affect appropriate Healthy:  appears stated age HEENT: normal Neck supple with no adenopathy JVP normal no bruits no thyromegaly Lungs clear with no wheezing and good diaphragmatic motion Heart:  S1/S2 no murmur, no rub, gallop or click PMI normal Abdomen: benighn, BS positve, no tenderness, no AAA no bruit.  No HSM or HJR Distal pulses intact with no bruits No edema Neuro non-focal Skin warm and dry No muscular weakness   Lab Results: Basic Metabolic Panel:  Recent Labs  12/28/15 0555 12/28/15 1317  NA 140 146*  K 4.0 4.3  CL 113* 115*  CO2 16* 21*  GLUCOSE 122* 97  BUN 40* 31*  CREATININE 1.82* 1.48*  CALCIUM 8.4* 8.7*   Liver Function Tests:  Recent Labs  12/28/15 0555  AST 27  ALT 14*  ALKPHOS 82  BILITOT 0.5  PROT 6.6  ALBUMIN 3.1*   CBC:  Recent Labs  12/28/15 0555 12/29/15 0650  WBC 7.6 5.4  NEUTROABS 5.2  --   HGB 10.5* 8.9*  HCT 32.9* 28.7*  MCV 92.7 92.9  PLT 314 253   Cardiac Enzymes:  Recent Labs  12/28/15 0835 12/28/15 1604  TROPONINI <0.03 <0.03   Thyroid Function Tests:  Recent Labs  12/28/15 0835  TSH 0.701   Anemia Panel: No results for input(s): VITAMINB12, FOLATE, FERRITIN, TIBC, IRON, RETICCTPCT in the last 72  hours.  Imaging: Ct Head Wo Contrast  Result Date: 12/28/2015 CLINICAL DATA:  Fall.  Mental status change. EXAM: CT HEAD WITHOUT CONTRAST CT CERVICAL SPINE WITHOUT CONTRAST TECHNIQUE: Multidetector CT imaging of the head and cervical spine was performed following the standard protocol without intravenous contrast. Multiplanar CT image reconstructions of the cervical spine were also generated. COMPARISON:  MRI head 11/24/2010 FINDINGS: CT HEAD FINDINGS Brain: Ventricle size normal. Cerebral volume normal. Negative for acute infarct, hemorrhage, mass lesion. No edema or shift of the midline structures. Vascular: No hyperdense vessel or unexpected calcification. Skull: Normal. Negative for fracture or focal lesion. Sinuses/Orbits: Mild mucosal edema paranasal sinuses. Other: None CT CERVICAL SPINE FINDINGS Alignment: Normal Skull base and vertebrae: Negative for fracture or mass lesion. No acute skeletal abnormality Soft tissues and spinal canal: Negative for soft tissue mass. Atherosclerotic calcification in the carotid bifurcation bilaterally Disc levels: Disc degeneration and moderate spurring C5-6 and C6-7. This contributes to spinal stenosis. Right foraminal narrowing C5-6 and C6-7 due to spurring. Moderate  left foraminal narrowing C6-7 due to spurring. Upper chest: Apical emphysema without mass lesion. Other: None IMPRESSION: No acute intracranial abnormality Cervical spondylosis.  Negative for cervical spine fracture. Electronically Signed   By: Franchot Gallo M.D.   On: 12/28/2015 07:20   Ct Chest Wo Contrast  Result Date: 12/28/2015 CLINICAL DATA:  Pt is sob, has chest pain, exam done for lung nodule EXAM: CT CHEST WITHOUT CONTRAST TECHNIQUE: Multidetector CT imaging of the chest was performed following the standard protocol without IV contrast. COMPARISON:  CT 04/21/2014 FINDINGS: Cardiovascular: Coronary artery calcification and aortic atherosclerotic calcification. Mediastinum/Nodes: No axillary or  supraclavicular adenopathy. Small mediastinal lymph nodes are numerous the not pathologically enlarged and unchanged from comparison exam. Largest example nodule measures 9 mm in the RIGHT lower paratracheal nodal station. Large hiatal hernia again demonstrated. Lungs/Pleura: Again demonstrated branching nodular pattern within the medial aspect of the LEFT upper lobe not changed in pattern from 11/02/2015. Small angular nodule in the RIGHT upper lobe is difficult to identify on current scan. No new pulmonary nodules. There is additionally, new branching nodular pattern (so-called tree-in-bud) within the RIGHT lower lobe which is increased from comparison exam. Mild bronchial thickening is present. Upper Abdomen: Limited view of the liver, kidneys, pancreas are unremarkable. Normal adrenal glands. Musculoskeletal: No aggressive osseous lesion. IMPRESSION: 1. No suspicious nodularity for lung carcinoma. Recommend continued CT lung screening per recommendations. 2. Stable branching nodular pattern in the LEFT upper lobe and new branching nodular pattern in the RIGHT lower lobe. These findings are moat consistent with chronic / recurrent / migratory infectious process. Consider mycobacterium avium intracellulare infection. 3. Stable prominent mediastinal lymph nodes. Electronically Signed   By: Suzy Bouchard M.D.   On: 12/28/2015 10:50   Ct Cervical Spine Wo Contrast  Result Date: 12/28/2015 CLINICAL DATA:  Fall.  Mental status change. EXAM: CT HEAD WITHOUT CONTRAST CT CERVICAL SPINE WITHOUT CONTRAST TECHNIQUE: Multidetector CT imaging of the head and cervical spine was performed following the standard protocol without intravenous contrast. Multiplanar CT image reconstructions of the cervical spine were also generated. COMPARISON:  MRI head 11/24/2010 FINDINGS: CT HEAD FINDINGS Brain: Ventricle size normal. Cerebral volume normal. Negative for acute infarct, hemorrhage, mass lesion. No edema or shift of the  midline structures. Vascular: No hyperdense vessel or unexpected calcification. Skull: Normal. Negative for fracture or focal lesion. Sinuses/Orbits: Mild mucosal edema paranasal sinuses. Other: None CT CERVICAL SPINE FINDINGS Alignment: Normal Skull base and vertebrae: Negative for fracture or mass lesion. No acute skeletal abnormality Soft tissues and spinal canal: Negative for soft tissue mass. Atherosclerotic calcification in the carotid bifurcation bilaterally Disc levels: Disc degeneration and moderate spurring C5-6 and C6-7. This contributes to spinal stenosis. Right foraminal narrowing C5-6 and C6-7 due to spurring. Moderate left foraminal narrowing C6-7 due to spurring. Upper chest: Apical emphysema without mass lesion. Other: None IMPRESSION: No acute intracranial abnormality Cervical spondylosis.  Negative for cervical spine fracture. Electronically Signed   By: Franchot Gallo M.D.   On: 12/28/2015 07:20   Mr Brain Wo Contrast  Result Date: 12/28/2015 CLINICAL DATA:  Lethargy and altered mental status. EXAM: MRI HEAD WITHOUT CONTRAST TECHNIQUE: Multiplanar, multiecho pulse sequences of the brain and surrounding structures were obtained without intravenous contrast. COMPARISON:  Head CT 12/28/2015 FINDINGS: Brain: The The midline structures are normal. No focal parenchymal signal abnormality. No mass lesion or midline shift. No hydrocephalus or extra-axial fluid collection. Vascular: Prominent intracranial left ICA flow void likely mild ectasia. The major flow voids  are preserved. The. The No evidence of chronic microhemorrhage or amyloid angiopathy. Skull and upper cervical spine: The visualized skull base, calvarium, upper cervical spine and extracranial soft tissues are normal. Sinuses/Orbits: No fluid levels or advanced mucosal thickening. No mastoid effusion. Normal orbits. IMPRESSION: Normal brain MRI for age. Electronically Signed   By: Ulyses Jarred M.D.   On: 12/28/2015 13:29   Dg Chest Port  1 View  Result Date: 12/28/2015 CLINICAL DATA:  Shortness of breath. EXAM: PORTABLE CHEST 1 VIEW COMPARISON:  CT chest 11/02/2015.  Chest 08/23/2015. FINDINGS: Postoperative changes in the mediastinum. Heart size and pulmonary vascularity are normal. Emphysematous changes in the lungs. Peribronchial thickening with central interstitial changes consistent with chronic bronchitis. Increased opacity in the right lung apex may represent pleural thickening or fluid. This is new since previous study. Degenerative changes in the shoulders. IMPRESSION: Emphysematous changes and bronchitic changes in the lungs. New opacification of the right lung apex could represent pleural thickening or fluid. Degenerative changes in the shoulders. Electronically Signed   By: Lucienne Capers M.D.   On: 12/28/2015 06:18    Cardiac Studies:  ECG: SB no acute ST changes    Telemetry:  SR Sinus arrhythmia no long pauses mislabeled on telemetry as afbi  Echo:  EF 60-65%   Medications:   . donepezil  10 mg Oral QHS  . DULoxetine  90 mg Oral Daily  . heparin  5,000 Units Subcutaneous Q8H  . insulin aspart  0-5 Units Subcutaneous QHS  . insulin aspart  0-9 Units Subcutaneous TID WC  . lamoTRIgine  50 mg Oral Daily   And  . lamoTRIgine  100 mg Oral QHS  . nicotine  21 mg Transdermal Daily  . pantoprazole  40 mg Oral Daily  . polyethylene glycol  17 g Oral Daily  . pravastatin  80 mg Oral q1800  . senna  2 tablet Oral Daily  . sodium chloride flush  3 mL Intravenous Q12H  . umeclidinium bromide  1 puff Inhalation Daily     . sodium chloride 75 mL/hr at 12/28/15 2129    Assessment/Plan:  MS Changes:  Baseline dementia Acidosis unknown etiology Cr, ABG, Ammonia CT, MRI ok  Cardiac status stable Would stop Aricept given pauses and slow HR.  Will arrange outpatient f/u with Dr Sharon Seller to d/c home from our perspective  Jenkins Rouge 12/29/2015, 8:22 AM

## 2015-12-29 NOTE — Evaluation (Signed)
Physical Therapy Evaluation & Discharge Patient Details Name: CYPHER PAULE Sr. MRN: [id number] DOB: 1940-10-18 Today's Date: 12/29/2015   History of Present Illness  BRAYANT DORR Sr. is a 75 y.o. male with medical history significant of MI/CAD/s/p CABG, COPD, DDD, depression/anxiety, DM - diet controlled, GERD, hiatal hernia, HLD, HTN, memory loss, PVD, osa, who presents with 2 days of progressive worsening lethargy and confusion.  Clinical Impression  Patient presents with decreased independence with mobility with balance deficit.  Feel he is safe for d/c home with wife and follow up HHPT for safety eval.  Feel continued mobility and use of assistive devices will aide in fall prevention.  Did review fall prevention information with pt and wife. No further skilled PT needs at this time.     Follow Up Recommendations Home health PT (safety eval)    Equipment Recommendations  None recommended by PT    Recommendations for Other Services       Precautions / Restrictions Precautions Precautions: Fall Restrictions Weight Bearing Restrictions: No      Mobility  Bed Mobility Overal bed mobility: Modified Independent                Transfers Overall transfer level: Modified independent                  Ambulation/Gait Ambulation/Gait assistance: Supervision;Min guard Ambulation Distance (Feet): 200 Feet Assistive device: None Gait Pattern/deviations: Step-through pattern;Drifts right/left;Wide base of support     General Gait Details: initially veering to right with looking into rooms to the right with minguard for safety, improved with increased distance/time  Stairs            Wheelchair Mobility    Modified Rankin (Stroke Patients Only)       Balance Overall balance assessment: Needs assistance   Sitting balance-Leahy Scale: Good     Standing balance support: No upper extremity supported Standing balance-Leahy Scale: Good Standing  balance comment: static balance good, some issues with dynamic activiites                             Pertinent Vitals/Pain Pain Assessment: No/denies pain    Home Living Family/patient expects to be discharged to:: Private residence Living Arrangements: Spouse/significant other Available Help at Discharge: Family;Available 24 hours/day Type of Home: House Home Access: Level entry     Home Layout: One level Home Equipment: Walker - 4 wheels;Cane - single point;Shower seat;Grab bars - tub/shower;Hand held shower head      Prior Function Level of Independence: Independent         Comments: used rollator or cane in community, but nothing in the home     Hand Dominance        Extremity/Trunk Assessment   Upper Extremity Assessment:  (not formally tested, reports awaiting L shoulder surgery by Dr. Tamera Punt)           Lower Extremity Assessment: Overall [facility] for tasks assessed      Cervical / Trunk Assessment: Kyphotic;Other exceptions  Communication   Communication: HOH  Cognition Arousal/Alertness: Awake/alert Behavior During Therapy: WFL for tasks assessed/performed Overall Cognitive Status: History of cognitive impairments - at baseline                      General Comments General comments (skin integrity, edema, etc.): Educated pt and spouse for some supervision with dynamic activities at home initially and for use of  walker and general fall prevention tips (including use of lighting to bathroom, as pt reports fell when dropped flashlight when going to bathroom at night.)    Exercises     Assessment/Plan    PT Assessment All further PT needs can be met in the next venue of care  PT Problem List Decreased balance;Decreased mobility;Decreased safety awareness          PT Treatment Interventions      PT Goals (Current goals can be found in the Care Plan section)  Acute Rehab PT Goals PT Goal Formulation: All assessment and  education complete, DC therapy    Frequency     Barriers to discharge        Co-evaluation               End of Session Equipment Utilized During Treatment: Gait belt Activity Tolerance: Patient tolerated treatment well Patient left: in bed;with call bell/phone within reach;with family/visitor present      Functional Assessment Tool Used: Clinical Judgement Functional Limitation: Mobility: Walking and moving around Mobility: Walking and Moving Around Current Status (I3474): At least 1 percent but less than 20 percent impaired, limited or restricted Mobility: Walking and Moving Around Goal Status [phone]): At least 1 percent but less than 20 percent impaired, limited or restricted Mobility: Walking and Moving Around Discharge Status [phone]): At least 1 percent but less than 20 percent impaired, limited or restricted    Time: 1155-1215 PT Time Calculation (min) (ACUTE ONLY): 20 min   Charges:   PT Evaluation $PT Eval Moderate Complexity: 1 Procedure     PT G Codes:   PT G-Codes **NOT FOR INPATIENT CLASS** Functional Assessment Tool Used: Clinical Judgement Functional Limitation: Mobility: Walking and moving around Mobility: Walking and Moving Around Current Status (E3329): At least 1 percent but less than 20 percent impaired, limited or restricted Mobility: Walking and Moving Around Goal Status [phone]): At least 1 percent but less than 20 percent impaired, limited or restricted Mobility: Walking and Moving Around Discharge Status [phone]): At least 1 percent but less than 20 percent impaired, limited or restricted    Reginia Naas 12/29/2015, 1:05 PM  Magda Kiel, Harris 12/29/2015

## 2015-12-29 NOTE — Care Management Note (Addendum)
Case Management Note  Patient Details  Name: Tyler LACEK Sr. MRN: [id number] Date of Birth: 05-18-1940  Subjective/Objective:      Presents with acute encephalopathy. From home with wife. Independent with ADL's. Uses cane with ambulation. [facility] provides pt with home oxygen.             PCP: [name]  Action/Plan:  Plan is to d/c to home with home health services( PT).  Expected Discharge Date:   12/29/2015          Expected Discharge Plan:  [place]  In-House Referral:     Discharge planning Services  CM Consult  Post Acute Care Choice:  Resumption of Svcs/PTA Provider Choice offered to:  Patient  DME Arranged:   (owns cane, home oxygen 2.5l/min [facility])) DME Agency:  [place]:  PT [facility] Agency:  [employer]/ referral made with Butch Penny @ [phone]  Status of Service:  completed  If discussed at Long Length of Stay Meetings, dates discussed:    Additional Comments:  Sharin Mons, RN 12/29/2015, 12:33 PM

## 2015-12-30 LAB — LEGIONELLA PNEUMOPHILA SEROGP 1 UR AG: L. pneumophila Serogp 1 Ur Ag: NEGATIVE

## 2015-12-31 ENCOUNTER — Other Ambulatory Visit: Payer: Self-pay | Admitting: Osteopathic Medicine

## 2016-01-01 ENCOUNTER — Other Ambulatory Visit: Payer: Self-pay | Admitting: Osteopathic Medicine

## 2016-01-01 DIAGNOSIS — G894 Chronic pain syndrome: Secondary | ICD-10-CM | POA: Diagnosis not present

## 2016-01-01 DIAGNOSIS — Z79891 Long term (current) use of opiate analgesic: Secondary | ICD-10-CM | POA: Diagnosis not present

## 2016-01-01 DIAGNOSIS — G890 Central pain syndrome: Secondary | ICD-10-CM | POA: Diagnosis not present

## 2016-01-01 DIAGNOSIS — M79661 Pain in right lower leg: Secondary | ICD-10-CM | POA: Diagnosis not present

## 2016-01-01 DIAGNOSIS — M25551 Pain in right hip: Secondary | ICD-10-CM | POA: Diagnosis not present

## 2016-01-01 DIAGNOSIS — M25552 Pain in left hip: Secondary | ICD-10-CM | POA: Diagnosis not present

## 2016-01-01 DIAGNOSIS — M79662 Pain in left lower leg: Secondary | ICD-10-CM | POA: Diagnosis not present

## 2016-01-02 ENCOUNTER — Telehealth: Payer: Self-pay | Admitting: Osteopathic Medicine

## 2016-01-02 LAB — CULTURE, BLOOD (ROUTINE X 2)
Culture: NO GROWTH
Culture: NO GROWTH

## 2016-01-02 NOTE — Telephone Encounter (Signed)
Error, disregard.

## 2016-01-02 NOTE — Telephone Encounter (Signed)
Wife notified and they have hospital f/u tomorrow

## 2016-01-03 ENCOUNTER — Ambulatory Visit (INDEPENDENT_AMBULATORY_CARE_PROVIDER_SITE_OTHER): Payer: Medicare Other | Admitting: Osteopathic Medicine

## 2016-01-03 ENCOUNTER — Encounter: Payer: Self-pay | Admitting: Osteopathic Medicine

## 2016-01-03 VITALS — BP 114/57 | HR 54 | Wt 146.0 lb

## 2016-01-03 DIAGNOSIS — G4700 Insomnia, unspecified: Secondary | ICD-10-CM

## 2016-01-03 DIAGNOSIS — T424X5A Adverse effect of benzodiazepines, initial encounter: Secondary | ICD-10-CM | POA: Diagnosis not present

## 2016-01-03 DIAGNOSIS — I2510 Atherosclerotic heart disease of native coronary artery without angina pectoris: Secondary | ICD-10-CM

## 2016-01-03 NOTE — Progress Notes (Signed)
HPI: [name]. is a 75 y.o. male  who presents to [place] today, 01/03/16,  for chief complaint of: No chief complaint on file.   Patient presents to the clinic for hospital follow-up. Per my review of the records, patient was admitted for acute encephalopathy, differential diagnosis included polypharmacy, acute on chronic renal injury, see below for my review of the discharge summary.  I confirmed this history with the patient and his wife. They state the reason that he had to go to the hospital was that he could not get up off of the floor after getting up at night, going to get a flashlight to look for something, dropping the flashlight, reaching down for it, and then not being able to arise again. Wife reports that he was acting funny, was not making sense. No head trauma. This history of back pain/unable to get up from the floor is not consistent with medical records which no 2 days of progressive lethargy and confusion in history and physical. Patient states that he attributes this incident to back pain rather than to any other cause such as dehydration or medications. He states that he is taking the alprazolam one and a half tablets at night to get to sleep most nights but not all nights - this is different from the way the prescription is written, he should not be taking 1.5 tablets at a time. He is very adamant about the continuation of this prescription, he states he has been on this a long time and needs it to help him sleep. He considers this a quality of life issue.  Patient is accompanied by wife who assists with history-taking.   Past medical, surgical, social and family history reviewed: Past Medical History:  Diagnosis Date  . Allergy   . Anxiety   . Asthma   . Benign prostatic hypertrophy   . CAD (coronary artery disease)    a. s/p multiple PCIs to RCA and eventual CABG in 1999;  b.LHC 11/2003: CFX 95%, RCA occluded, SVG-RCA  occluded, SVG-OM patent, native LAD patent. EF was 55%.  Patient had left to right collaterals and medical therapy was recommended    . Chronic kidney disease   . COPD (chronic obstructive pulmonary disease) ([place])   . DDD (degenerative disc disease), lumbar   . Dementia   . Depression   . Diabetes mellitus    on no medications  . Diverticulitis, colon   . Emphysema of lung ([place])   . Esophageal stricture   . GERD (gastroesophageal reflux disease)   . Herniated disc   . Hiatal hernia   . History of colonic polyps   . HOH (hard of hearing)   . Hx of colonoscopy   . Hyperlipidemia   . Hypertension   . Insomnia   . Iron deficiency anemia   . Lumbar back pain    chronic  . Memory loss   . OA (osteoarthritis)   . OSA (obstructive sleep apnea)    wife denies this  . Pneumonia   . PVD (peripheral vascular disease) ([place])   . Shortness of breath dyspnea    on home O2  . Spinal stenosis    Past Surgical History:  Procedure Laterality Date  . bilateral knee replacements    . CARPAL TUNNEL RELEASE    . CIRCUMCISION    . COLONOSCOPY N/A 10/13/2015   Procedure: COLONOSCOPY;  Surgeon: Irene Shipper, MD;  Location: [facility] END[facility]COPY;  Service: Endoscopy;  Laterality: N/A;  . CORONARY ANGIOPLASTY WITH STENT PLACEMENT  2/99  . CORONARY ARTERY BYPASS GRAFT  09/1997   double bypss  . EYE SURGERY  06/2011   " eye muscle   . heart bypass    . HEMORRHOID SURGERY    . SHOULDER ARTHROSCOPY  right  . STRABISMUS SURGERY  08/29/2011   Procedure: REPAIR STRABISMUS;  Surgeon: Derry Skill, MD;  Location: [facility];  Service: Ophthalmology;  Laterality: Left;   Social History  Substance Use Topics  . Smoking status: Current Every Day Smoker    Packs/day: 1.00    Years: 15.00    Types: Cigarettes    Start date: 02/14/1948  . Smokeless tobacco: Never Used     Comment: Peak rate of 3ppd  . Alcohol use No     Comment: none for 20 years    Family History  Problem Relation Age of Onset  . Stroke Father    . Emphysema Father   . Heart disease    . Hypertension    . Hyperlipidemia    . Allergies    . Diabetes    . Kidney cancer Daughter   . Breast cancer Daughter   . Colon cancer Neg Hx      Current medication list and allergy/intolerance information reviewed:   Current Outpatient Prescriptions  Medication Sig Dispense Refill  . albuterol (VENTOLIN HFA) 108 (90 BASE) MCG/ACT inhaler Inhale 2 puffs into the lungs every 6 (six) hours as needed for wheezing or shortness of breath. 1 Inhaler 11  . ALPRAZolam (XANAX) 0.25 MG tablet take 1 tablet by mouth twice a day if needed for anxiety or sleep 45 tablet 0  . b complex-vitamin c-folic acid (NEPHRO-VITE) 0.8 MG TABS tablet Take 1 tablet by mouth daily.    . DULoxetine (CYMBALTA) 30 MG capsule Take 90 mg by mouth every morning.     Marland Kitchen HYDROcodone-acetaminophen (NORCO) 10-325 MG tablet Take 0.5 tablets by mouth every 8 (eight) hours as needed for severe pain.    Marland Kitchen lamoTRIgine (LAMICTAL) 100 MG tablet Take 50-100 mg by mouth 2 (two) times daily. 50mg  in the morning, and 100mg  in the evening    . nitroGLYCERIN (NITROSTAT) 0.4 MG SL tablet Place 1 tablet (0.4 mg total) under the tongue every 5 (five) minutes as needed. For chest pain 25 tablet 6  . omeprazole (PRILOSEC) 20 MG capsule Take 1 capsule (20 mg total) by mouth 2 (two) times daily before a meal. 60 capsule 11  . OXYGEN Inhale 2.5 L into the lungs at bedtime.    . pravastatin (PRAVACHOL) 80 MG tablet take 1 tablet by mouth once daily (Patient taking differently: take 1 tablet by mouth once at night) 90 tablet 3  . terbinafine (LAMISIL AT) 1 % cream Apply 1 application topically 2 (two) times daily. 30 g 0  . zolpidem (AMBIEN) 10 MG tablet Take 0.5 tablets (5 mg total) by mouth at bedtime as needed for sleep.     No current facility-administered medications for this visit.    Allergies  Allergen Reactions  . Celecoxib Nausea And Vomiting    REACTION: GI upset  . Chicken Protein Other  (See Comments)    Will not eat chicken  . Fish-Derived Products Other (See Comments)    Will not eat seafood of any kind  . Morphine Itching    REACTION: itching  . Nalbuphine Other (See Comments)    REACTION: contraindication with oxycontin.  . Prednisone Other (See Comments)  REACTION: "yeast infections"  . Venlafaxine Other (See Comments)    REACTION: GI upset      Review of Systems:  Constitutional:  No significant fatigue.   Cardiac: No  chest pain  Respiratory:  No  shortness of breath  Psychiatric: +sleep problems,  Exam:  BP (!) 114/57   Pulse (!) 54   Wt 146 lb (66.2 kg)   BMI 20.36 kg/m   Constitutional: VS see above. General Appearance: alert, well-developed, well-nourished, NAD  Psychiatric: Normal judgment/insight. Normal mood and affect. Oriented x3.   Discharge summary reviewed. Patient overnight in the hospital 12/28/2015 to 12/29/2015. Admitted for acute encephalopathy - 2 days history of lethargy and confusion. Concern for multifactorial cause, including acute on chronic kidney disease, polypharmacy particularly with opioids, benzos, Lamictal. Workup for other causes negative.. Follow-up with me (address his renal status, medications), cardiology (fluctuating heart rate, seen by cardiology inpatient, stopped Aricept) , pulmonology (follow-up outpatient for lung nodule/oxygen).  ASSESSMENT/PLAN:   Insomnia  Benzodiazepine causing adverse effect in therapeutic use, initial encounter   I explained to the patient my position on the continuation of these medications in light of his recent hospitalization with concern for mental status changes. He is already getting opiate pain prescription from pain management. Given negative workup in the hospital, we have to consider the benzodiazepine use as a possible contributing factor to his event, which includes ambulatory dysfunction and delirium, patient adamantly denies that he "fell."   I explained to the  patient my concern for his safety as my #1 priority, my obligation to do no harm, and my right to my own professional judgment regarding the prescriptions that I choose to write for patients keeping their best interest and overall health in mind. Given the above concerns, I do not feel comfortable continuing benzodiazepine use for this patient. I offered to explore alternative treatments for insomnia, the patient declines at this time. He states that he is going to return to his previous PCP. He understands my position on the matter and he is very amicable. I understand that this is a quality of life issue for him. I believe he is in some denial about his dependence on this medication, but I believe he has every right to advocate for himself in seeking a treatment which has worked for him and which he considers to improve his quality of life despite the risks.  Of note, the other issues that were asked to be followed up on outpatient, including rechecking labs, addressing other medication such as Lasix, the patient was not interested in discussing these matters at this time. I advised him that this did put him at risk of other complications with regard to kidney function and electrolytes, etc., patient verbalizes understanding and states that he would still like to not follow up on these at this time.    Patient Instructions  I do not think it is safe to continue the alprazolam, given your other medical problems and medications it puts you at risk for falls, poor coordination, delirium, etc. I do not feel comfortable continuing this medication for you. If you do not wish to follow up with me for your other medical care, I am disappointed but I understand your position and your right to advocate for yourself and what you want and need. Please let me know if there is anything I can do to help you.    Visit summary with medication list and pertinent instructions was printed for patient to review. All  questions at time of visit were answered - patient instructed to contact office with any additional concerns. ER/RTC precautions were reviewed with the patient. Follow-up plan: Return for routine follow-up as needed.  Note: Total time spent 25 minutes, greater than 50% of the visit was spent face-to-face counseling and coordinating care for the following: The primary encounter diagnosis was Insomnia. A diagnosis of Benzodiazepine causing adverse effect in therapeutic use, initial encounter was also pertinent to this visit.Marland Kitchen

## 2016-01-03 NOTE — Patient Instructions (Signed)
I do not think it is safe to continue the alprazolam, given your other medical problems and medications it puts you at risk for falls, poor coordination, delirium, etc. I do not feel comfortable continuing this medication for you. If you do not wish to follow up with me for your other medical care, I am disappointed but I understand your position and your right to advocate for yourself and what you want and need. Please let me know if there is anything I can do to help you.

## 2016-01-15 ENCOUNTER — Other Ambulatory Visit: Payer: Self-pay | Admitting: Orthopedic Surgery

## 2016-01-16 ENCOUNTER — Ambulatory Visit (HOSPITAL_BASED_OUTPATIENT_CLINIC_OR_DEPARTMENT_OTHER): Payer: Medicare Other

## 2016-01-16 ENCOUNTER — Encounter: Payer: Self-pay | Admitting: Osteopathic Medicine

## 2016-01-16 ENCOUNTER — Encounter (HOSPITAL_BASED_OUTPATIENT_CLINIC_OR_DEPARTMENT_OTHER): Payer: Self-pay

## 2016-01-16 ENCOUNTER — Other Ambulatory Visit: Payer: Self-pay | Admitting: Osteopathic Medicine

## 2016-01-16 ENCOUNTER — Ambulatory Visit (INDEPENDENT_AMBULATORY_CARE_PROVIDER_SITE_OTHER): Payer: Medicare Other | Admitting: Osteopathic Medicine

## 2016-01-16 ENCOUNTER — Ambulatory Visit (INDEPENDENT_AMBULATORY_CARE_PROVIDER_SITE_OTHER): Payer: Medicare Other

## 2016-01-16 VITALS — BP 121/58 | HR 48 | Temp 97.5°F | Wt 140.0 lb

## 2016-01-16 DIAGNOSIS — N183 Chronic kidney disease, stage 3 unspecified: Secondary | ICD-10-CM

## 2016-01-16 DIAGNOSIS — J449 Chronic obstructive pulmonary disease, unspecified: Secondary | ICD-10-CM | POA: Diagnosis not present

## 2016-01-16 DIAGNOSIS — R05 Cough: Secondary | ICD-10-CM | POA: Diagnosis not present

## 2016-01-16 DIAGNOSIS — I2510 Atherosclerotic heart disease of native coronary artery without angina pectoris: Secondary | ICD-10-CM | POA: Diagnosis not present

## 2016-01-16 DIAGNOSIS — Z0000 Encounter for general adult medical examination without abnormal findings: Secondary | ICD-10-CM

## 2016-01-16 DIAGNOSIS — R059 Cough, unspecified: Secondary | ICD-10-CM

## 2016-01-16 DIAGNOSIS — J441 Chronic obstructive pulmonary disease with (acute) exacerbation: Secondary | ICD-10-CM | POA: Diagnosis not present

## 2016-01-16 MED ORDER — IPRATROPIUM-ALBUTEROL 0.5-2.5 (3) MG/3ML IN SOLN
3.0000 mL | Freq: Once | RESPIRATORY_TRACT | Status: AC
  Administered 2016-01-16: 3 mL via RESPIRATORY_TRACT

## 2016-01-16 MED ORDER — PREDNISONE 20 MG PO TABS: 20.0000 mg | ORAL_TABLET | Freq: Two times a day (BID) | ORAL | 0 refills | Status: DC

## 2016-01-16 MED ORDER — NEBULIZER DEVI: 1 refills | Status: DC

## 2016-01-16 MED ORDER — IPRATROPIUM-ALBUTEROL 0.5-2.5 (3) MG/3ML IN SOLN: 3.0000 mL | RESPIRATORY_TRACT | 3 refills | Status: DC | PRN

## 2016-01-16 MED ORDER — NEBULIZER/TUBING/MOUTHPIECE KIT: PACK | 1 refills | Status: DC

## 2016-01-16 MED ORDER — BENZONATATE 200 MG PO CAPS: 200.0000 mg | ORAL_CAPSULE | Freq: Three times a day (TID) | ORAL | 0 refills | Status: DC | PRN

## 2016-01-16 NOTE — Progress Notes (Signed)
HPI: [name]. is a 75 y.o. male  who presents to [place] today, 01/16/16,  for chief complaint of:  Chief Complaint  Patient presents with  . Nasal Congestion  . Cough    It's been taking increased redness of breath and coughing. Known COPD, states compliance with home inhaled medications as usual. No fever or productive cough. No generalized weakness.  Last visit, patient and I discussed discontinuation of alprazolam. He is doing okay off of this medication, doesn't really bring it up. At last visit, he was recently out of the hospital and didn't want to get labs done on that day. He is due for repeat BMP, CBC  Patient is accompanied by wife who assists with history-taking.   Past medical, surgical, social and family history reviewed: Past Medical History:  Diagnosis Date  . Allergy   . Anxiety   . Asthma   . Benign prostatic hypertrophy   . CAD (coronary artery disease)    a. s/p multiple PCIs to RCA and eventual CABG in 1999;  b.LHC 11/2003: CFX 95%, RCA occluded, SVG-RCA occluded, SVG-OM patent, native LAD patent. EF was 55%.  Patient had left to right collaterals and medical therapy was recommended    . Chronic kidney disease   . COPD (chronic obstructive pulmonary disease) ([place])   . DDD (degenerative disc disease), lumbar   . Dementia   . Depression   . Diabetes mellitus    on no medications  . Diverticulitis, colon   . Emphysema of lung ([place])   . Esophageal stricture   . GERD (gastroesophageal reflux disease)   . Herniated disc   . Hiatal hernia   . History of colonic polyps   . HOH (hard of hearing)   . Hx of colonoscopy   . Hyperlipidemia   . Hypertension   . Insomnia   . Iron deficiency anemia   . Lumbar back pain    chronic  . Memory loss   . OA (osteoarthritis)   . OSA (obstructive sleep apnea)    wife denies this  . Pneumonia   . PVD (peripheral vascular disease) ([place])   . Shortness of breath dyspnea     on home O2  . Spinal stenosis    Past Surgical History:  Procedure Laterality Date  . bilateral knee replacements    . CARPAL TUNNEL RELEASE    . CIRCUMCISION    . COLONOSCOPY N/A 10/13/2015   Procedure: COLONOSCOPY;  Surgeon: Irene Shipper, MD;  Location: [facility] ENDOSCOPY;  Service: Endoscopy;  Laterality: N/A;  . CORONARY ANGIOPLASTY WITH STENT PLACEMENT  2/99  . CORONARY ARTERY BYPASS GRAFT  09/1997   double bypss  . EYE SURGERY  06/2011   " eye muscle   . heart bypass    . HEMORRHOID SURGERY    . SHOULDER ARTHROSCOPY  right  . STRABISMUS SURGERY  08/29/2011   Procedure: REPAIR STRABISMUS;  Surgeon: Derry Skill, MD;  Location: [facility];  Service: Ophthalmology;  Laterality: Left;   Social History  Substance Use Topics  . Smoking status: Current Every Day Smoker    Packs/day: 1.00    Years: 15.00    Types: Cigarettes    Start date: 02/14/1948  . Smokeless tobacco: Never Used     Comment: Peak rate of 3ppd  . Alcohol use No     Comment: none for 20 years    Family History  Problem Relation Age of Onset  .  Stroke Father   . Emphysema Father   . Heart disease    . Hypertension    . Hyperlipidemia    . Allergies    . Diabetes    . Kidney cancer Daughter   . Breast cancer Daughter   . Colon cancer Neg Hx      Current medication list and allergy/intolerance information reviewed:   Current Outpatient Prescriptions  Medication Sig Dispense Refill  . albuterol (VENTOLIN HFA) 108 (90 BASE) MCG/ACT inhaler Inhale 2 puffs into the lungs every 6 (six) hours as needed for wheezing or shortness of breath. 1 Inhaler 11  . ALPRAZolam (XANAX) 0.25 MG tablet take 1 tablet by mouth twice a day if needed for anxiety or sleep 45 tablet 0  . b complex-vitamin c-folic acid (NEPHRO-VITE) 0.8 MG TABS tablet Take 1 tablet by mouth daily.    . DULoxetine (CYMBALTA) 30 MG capsule Take 90 mg by mouth every morning.     Marland Kitchen HYDROcodone-acetaminophen (NORCO) 10-325 MG tablet Take 0.5 tablets by mouth  every 8 (eight) hours as needed for severe pain.    Marland Kitchen lamoTRIgine (LAMICTAL) 100 MG tablet Take 50-100 mg by mouth 2 (two) times daily. 40m in the morning, and 1026min the evening    . nitroGLYCERIN (NITROSTAT) 0.4 MG SL tablet Place 1 tablet (0.4 mg total) under the tongue every 5 (five) minutes as needed. For chest pain 25 tablet 6  . omeprazole (PRILOSEC) 20 MG capsule Take 1 capsule (20 mg total) by mouth 2 (two) times daily before a meal. 60 capsule 11  . OXYGEN Inhale 2.5 L into the lungs at bedtime.    . pravastatin (PRAVACHOL) 80 MG tablet take 1 tablet by mouth once daily (Patient taking differently: take 1 tablet by mouth once at night) 90 tablet 3  . terbinafine (LAMISIL AT) 1 % cream Apply 1 application topically 2 (two) times daily. 30 g 0  . zolpidem (AMBIEN) 10 MG tablet Take 0.5 tablets (5 mg total) by mouth at bedtime as needed for sleep.     No current facility-administered medications for this visit.    Allergies  Allergen Reactions  . Celecoxib Nausea And Vomiting    REACTION: GI upset  . Chicken Protein Other (See Comments)    Will not eat chicken  . Fish-Derived Products Other (See Comments)    Will not eat seafood of any kind  . Morphine Itching    REACTION: itching  . Nalbuphine Other (See Comments)    REACTION: contraindication with oxycontin.  . Prednisone Other (See Comments)    REACTION: "yeast infections"  . Venlafaxine Other (See Comments)    REACTION: GI upset      Review of Systems:  Constitutional:  No  fever, no chills, +No recent illness, No unintentional weight changes. +significant fatigue.   HEENT: No  headache, no vision change, no hearing change, No sore throat, +sinus pressure  Cardiac: No  chest pain, No  pressure, No palpitations, No  Orthopnea  Respiratory:  +shortness of breath. +Cough  Gastrointestinal: No  abdominal pain, No  nausea, No  vomiting,  No  blood in stool, No  diarrhea, No  constipation   Neurologic: No  weakness,  No  dizziness  Exam:  BP (!) 121/58   Pulse (!) 48   Temp 97.5 F (36.4 C) (Oral)   Wt 140 lb (63.5 kg)   BMI 19.53 kg/m   Constitutional: VS see above. General Appearance: alert, well-developed, well-nourished, NAD  Eyes:  Normal lids and conjunctive, non-icteric sclera  Ears, Nose, Mouth, Throat: MMM, Normal external inspection ears/nares/mouth/lips/gums. TM normal bilaterally. Pharynx/tonsils no erythema, no exudate. Nasal mucosa normal.   Neck: No masses, trachea midline.  Respiratory: Normal respiratory effort. Diminished breath sounds bilaterally. Mild wheeze on left  Cardiovascular: S1/S2 normal, no murmur, no rub/gallop auscultated. RRR.    Dg Chest 2 View  Result Date: 01/16/2016 CLINICAL DATA:  Chronic cough. EXAM: CHEST  2 VIEW COMPARISON:  12/28/2015 FINDINGS: There is hyperinflation of the lungs compatible with COPD. Prior CABG. Heart is normal size. Diffuse interstitial prominence throughout the lungs compatible with chronic lung disease. No effusions or confluent opacities. No acute bony abnormality. IMPRESSION: COPD.  Chronic interstitial lung disease.  No active disease. Electronically Signed   By: Rolm Baptise M.D.   On: 01/16/2016 15:27   Chest x-ray personally reviewed with patient   ASSESSMENT/PLAN: Prednisone will occasionally give the patient these infections, but I advised it was necessary in this case to help treat COPD exacerbation. I don't see compelling reason for antibiotics at this time, but if patient gets worse, let me know. See instructions printed below  Cough - Plan: benzonatate (TESSALON) 200 MG capsule, CANCELED: DG Chest 2 View  COPD exacerbation ([place]) - Plan: predniSONE (DELTASONE) 20 MG tablet, ipratropium-albuterol (DUONEB) 0.5-2.5 (3) MG/3ML SOLN, Respiratory Therapy Supplies (NEBULIZER) DEVI, Respiratory Therapy Supplies (NEBULIZER/TUBING/MOUTHPIECE) KIT, ipratropium-albuterol (DUONEB) 0.5-2.5 (3) MG/3ML nebulizer solution 3 mL  Chronic  kidney disease, stage 3 (moderate) - Plan: CBC with Differential/Platelet, COMPLETE METABOLIC PANEL WITH GFR   Patient Instructions  We are treating with steroids, nebulized medications, cough medicine in addition to her usual inhalers. I do not think you need antibiotics at this time, but if you develop fever or worsening shortness of breath, please give [place] a call and I will send in some antibiotics, but if you are having severe shortness of breath or weakness please go to the emergency room.  You're still due for blood work follow-up after hospitalization. I have attached orders for you to go to the lab and have drawn.     Visit summary with medication list and pertinent instructions was printed for patient to review. All questions at time of visit were answered - patient instructed to contact office with any additional concerns. ER/RTC precautions were reviewed with the patient. Follow-up plan: Return for surgical clearance if needed, you are also still due for blood work for follow-up after hospitalizat.

## 2016-01-16 NOTE — Patient Instructions (Addendum)
We are treating with steroids, nebulized medications, cough medicine in addition to her usual inhalers. I do not think you need antibiotics at this time, but if you develop fever or worsening shortness of breath, please give [place] a call and I will send in some antibiotics, but if you are having severe shortness of breath or weakness please go to the emergency room.  You're still due for blood work follow-up after hospitalization. I have attached orders for you to go to the lab and have drawn.

## 2016-01-17 DIAGNOSIS — M19212 Secondary osteoarthritis, left shoulder: Secondary | ICD-10-CM | POA: Diagnosis not present

## 2016-01-29 DIAGNOSIS — M545 Low back pain: Secondary | ICD-10-CM | POA: Diagnosis not present

## 2016-01-29 DIAGNOSIS — G894 Chronic pain syndrome: Secondary | ICD-10-CM | POA: Diagnosis not present

## 2016-01-29 DIAGNOSIS — M25512 Pain in left shoulder: Secondary | ICD-10-CM | POA: Diagnosis not present

## 2016-01-29 DIAGNOSIS — Z79891 Long term (current) use of opiate analgesic: Secondary | ICD-10-CM | POA: Diagnosis not present

## 2016-01-29 DIAGNOSIS — M79661 Pain in right lower leg: Secondary | ICD-10-CM | POA: Diagnosis not present

## 2016-01-29 DIAGNOSIS — M79662 Pain in left lower leg: Secondary | ICD-10-CM | POA: Diagnosis not present

## 2016-01-29 DIAGNOSIS — M25551 Pain in right hip: Secondary | ICD-10-CM | POA: Diagnosis not present

## 2016-01-29 DIAGNOSIS — M25511 Pain in right shoulder: Secondary | ICD-10-CM | POA: Diagnosis not present

## 2016-01-29 DIAGNOSIS — M25552 Pain in left hip: Secondary | ICD-10-CM | POA: Diagnosis not present

## 2016-02-02 ENCOUNTER — Other Ambulatory Visit: Payer: Self-pay | Admitting: Osteopathic Medicine

## 2016-02-06 ENCOUNTER — Encounter (HOSPITAL_COMMUNITY): Payer: Self-pay

## 2016-02-06 ENCOUNTER — Inpatient Hospital Stay (HOSPITAL_COMMUNITY): Admission: RE | Admit: 2016-02-06 | Payer: Medicare Other | Source: Ambulatory Visit

## 2016-02-15 ENCOUNTER — Inpatient Hospital Stay (HOSPITAL_COMMUNITY): Admission: RE | Admit: 2016-02-15 | Payer: Medicare Other | Source: Ambulatory Visit | Admitting: Orthopedic Surgery

## 2016-02-15 ENCOUNTER — Encounter (HOSPITAL_COMMUNITY): Admission: RE | Payer: Self-pay | Source: Ambulatory Visit

## 2016-02-15 SURGERY — ARTHROPLASTY, SHOULDER, TOTAL, REVERSE
Anesthesia: Choice | Laterality: Left

## 2016-02-20 ENCOUNTER — Ambulatory Visit: Payer: Medicare Other | Admitting: Pulmonary Disease

## 2016-02-20 ENCOUNTER — Ambulatory Visit: Payer: Medicare Other

## 2016-02-23 ENCOUNTER — Other Ambulatory Visit: Payer: Self-pay | Admitting: Osteopathic Medicine

## 2016-02-23 ENCOUNTER — Other Ambulatory Visit: Payer: Self-pay | Admitting: Internal Medicine

## 2016-02-27 DIAGNOSIS — M25551 Pain in right hip: Secondary | ICD-10-CM | POA: Diagnosis not present

## 2016-02-27 DIAGNOSIS — M79661 Pain in right lower leg: Secondary | ICD-10-CM | POA: Diagnosis not present

## 2016-02-27 DIAGNOSIS — M25552 Pain in left hip: Secondary | ICD-10-CM | POA: Diagnosis not present

## 2016-02-27 DIAGNOSIS — G890 Central pain syndrome: Secondary | ICD-10-CM | POA: Diagnosis not present

## 2016-02-27 DIAGNOSIS — M79662 Pain in left lower leg: Secondary | ICD-10-CM | POA: Diagnosis not present

## 2016-02-27 DIAGNOSIS — Z79891 Long term (current) use of opiate analgesic: Secondary | ICD-10-CM | POA: Diagnosis not present

## 2016-02-27 DIAGNOSIS — G894 Chronic pain syndrome: Secondary | ICD-10-CM | POA: Diagnosis not present

## 2016-03-05 ENCOUNTER — Ambulatory Visit (INDEPENDENT_AMBULATORY_CARE_PROVIDER_SITE_OTHER): Payer: Medicare Other | Admitting: Sports Medicine

## 2016-03-05 DIAGNOSIS — I2510 Atherosclerotic heart disease of native coronary artery without angina pectoris: Secondary | ICD-10-CM | POA: Diagnosis not present

## 2016-03-05 DIAGNOSIS — M19211 Secondary osteoarthritis, right shoulder: Secondary | ICD-10-CM

## 2016-03-05 DIAGNOSIS — M19212 Secondary osteoarthritis, left shoulder: Secondary | ICD-10-CM

## 2016-03-05 NOTE — Progress Notes (Signed)
  Subjective:    CC: Bilateral shoulder pain  HPI: This is a pleasant 75 year old male, he has bilateral glenohumeral osteoarthritis, initially a reverse total shoulder arthroplasty was planned however this was canceled due to his multiple medical problems, Dr. Tamera Punt has advised him to continue follow-up with me for occasional injections.  Past medical history:  Negative.  See flowsheet/record as well for more information.  Surgical history: Negative.  See flowsheet/record as well for more information.  Family history: Negative.  See flowsheet/record as well for more information.  Social history: Negative.  See flowsheet/record as well for more information.  Allergies, and medications have been entered into the medical record, reviewed, and no changes needed.   Review of Systems: No fevers, chills, night sweats, weight loss, chest pain, or shortness of breath.   Objective:    General: Well Developed, well nourished, and in no acute distress.  Neuro: Alert and oriented x3, extra-ocular muscles intact, sensation grossly intact.  HEENT: Normocephalic, atraumatic, pupils equal round reactive to light, neck supple, no masses, no lymphadenopathy, thyroid nonpalpable.  Skin: Warm and dry, no rashes. Cardiac: Regular rate and rhythm, no murmurs rubs or gallops, no lower extremity edema.  Respiratory: Clear to auscultation bilaterally. Not using accessory muscles, speaking in full sentences.  Procedure: Real-time Ultrasound Guided Injection of left glenohumeral joint Device: GE Logiq E  Verbal informed consent obtained.  Time-out conducted.  Noted no overlying erythema, induration, or other signs of local infection.  Skin prepped in a sterile fashion.  Local anesthesia: Topical Ethyl chloride.  With sterile technique and under real time ultrasound guidance:  1 mL kenalog 40, 2 mL lidocaine, 2 mL Marcaine injected easily Completed without difficulty  Pain immediately resolved suggesting  accurate placement of the medication.  Advised to call if fevers/chills, erythema, induration, drainage, or persistent bleeding.  Images permanently stored and available for review in the ultrasound unit.  Impression: Technically successful ultrasound guided injection.    Procedure: Real-time Ultrasound Guided Injection of right glenohumeral joint Device: GE Logiq E  Verbal informed consent obtained.  Time-out conducted.  Noted no overlying erythema, induration, or other signs of local infection.  Skin prepped in a sterile fashion.  Local anesthesia: Topical Ethyl chloride.  With sterile technique and under real time ultrasound guidance:  1 mL kenalog 40, 2 mL lidocaine, 2 mL Marcaine injected easily Completed without difficulty  Pain immediately resolved suggesting accurate placement of the medication.  Advised to call if fevers/chills, erythema, induration, drainage, or persistent bleeding.  Images permanently stored and available for review in the ultrasound unit.  Impression: Technically successful ultrasound guided injection.  Impression and Recommendations:    Other secondary osteoarthritis of both shoulders Bilateral glenohumeral injection as above. Surgical risk was too high, reverse total shoulder arthroplasty was canceled by Dr. Tamera Punt.

## 2016-03-05 NOTE — Assessment & Plan Note (Signed)
Bilateral glenohumeral injection as above. Surgical risk was too high, reverse total shoulder arthroplasty was canceled by Dr. Tamera Punt.

## 2016-03-09 ENCOUNTER — Other Ambulatory Visit: Payer: Self-pay | Admitting: Internal Medicine

## 2016-03-12 ENCOUNTER — Other Ambulatory Visit: Payer: Self-pay | Admitting: *Deleted

## 2016-03-13 ENCOUNTER — Other Ambulatory Visit: Payer: Self-pay | Admitting: Osteopathic Medicine

## 2016-03-13 ENCOUNTER — Other Ambulatory Visit: Payer: Self-pay | Admitting: Internal Medicine

## 2016-03-13 NOTE — Telephone Encounter (Signed)
Patient request refill for Albuterol. Rx has been sent to patient pharmacy. Rhonda Cunningham,CMA

## 2016-03-26 DIAGNOSIS — M79661 Pain in right lower leg: Secondary | ICD-10-CM | POA: Diagnosis not present

## 2016-03-26 DIAGNOSIS — G894 Chronic pain syndrome: Secondary | ICD-10-CM | POA: Diagnosis not present

## 2016-03-26 DIAGNOSIS — M79662 Pain in left lower leg: Secondary | ICD-10-CM | POA: Diagnosis not present

## 2016-03-26 DIAGNOSIS — M25551 Pain in right hip: Secondary | ICD-10-CM | POA: Diagnosis not present

## 2016-03-26 DIAGNOSIS — Z79891 Long term (current) use of opiate analgesic: Secondary | ICD-10-CM | POA: Diagnosis not present

## 2016-03-26 DIAGNOSIS — G890 Central pain syndrome: Secondary | ICD-10-CM | POA: Diagnosis not present

## 2016-03-26 DIAGNOSIS — M25552 Pain in left hip: Secondary | ICD-10-CM | POA: Diagnosis not present

## 2016-04-24 DIAGNOSIS — M25552 Pain in left hip: Secondary | ICD-10-CM | POA: Diagnosis not present

## 2016-04-24 DIAGNOSIS — M79661 Pain in right lower leg: Secondary | ICD-10-CM | POA: Diagnosis not present

## 2016-04-24 DIAGNOSIS — Z79891 Long term (current) use of opiate analgesic: Secondary | ICD-10-CM | POA: Diagnosis not present

## 2016-04-24 DIAGNOSIS — M25551 Pain in right hip: Secondary | ICD-10-CM | POA: Diagnosis not present

## 2016-04-24 DIAGNOSIS — G894 Chronic pain syndrome: Secondary | ICD-10-CM | POA: Diagnosis not present

## 2016-04-24 DIAGNOSIS — M79662 Pain in left lower leg: Secondary | ICD-10-CM | POA: Diagnosis not present

## 2016-04-24 DIAGNOSIS — G890 Central pain syndrome: Secondary | ICD-10-CM | POA: Diagnosis not present

## 2016-05-14 DIAGNOSIS — F411 Generalized anxiety disorder: Secondary | ICD-10-CM | POA: Diagnosis not present

## 2016-05-14 DIAGNOSIS — F332 Major depressive disorder, recurrent severe without psychotic features: Secondary | ICD-10-CM | POA: Diagnosis not present

## 2016-05-14 DIAGNOSIS — F0281 Dementia in other diseases classified elsewhere with behavioral disturbance: Secondary | ICD-10-CM | POA: Diagnosis not present

## 2016-05-21 DIAGNOSIS — M79662 Pain in left lower leg: Secondary | ICD-10-CM | POA: Diagnosis not present

## 2016-05-21 DIAGNOSIS — Z79891 Long term (current) use of opiate analgesic: Secondary | ICD-10-CM | POA: Diagnosis not present

## 2016-05-21 DIAGNOSIS — M25552 Pain in left hip: Secondary | ICD-10-CM | POA: Diagnosis not present

## 2016-05-21 DIAGNOSIS — G894 Chronic pain syndrome: Secondary | ICD-10-CM | POA: Diagnosis not present

## 2016-05-21 DIAGNOSIS — G8929 Other chronic pain: Secondary | ICD-10-CM | POA: Diagnosis not present

## 2016-05-21 DIAGNOSIS — M25511 Pain in right shoulder: Secondary | ICD-10-CM | POA: Diagnosis not present

## 2016-05-21 DIAGNOSIS — M25562 Pain in left knee: Secondary | ICD-10-CM | POA: Diagnosis not present

## 2016-05-21 DIAGNOSIS — M25551 Pain in right hip: Secondary | ICD-10-CM | POA: Diagnosis not present

## 2016-05-21 DIAGNOSIS — M79661 Pain in right lower leg: Secondary | ICD-10-CM | POA: Diagnosis not present

## 2016-05-31 ENCOUNTER — Encounter: Payer: Self-pay | Admitting: Sports Medicine

## 2016-05-31 ENCOUNTER — Ambulatory Visit (INDEPENDENT_AMBULATORY_CARE_PROVIDER_SITE_OTHER): Payer: Medicare Other | Admitting: Sports Medicine

## 2016-05-31 DIAGNOSIS — M19212 Secondary osteoarthritis, left shoulder: Secondary | ICD-10-CM | POA: Diagnosis not present

## 2016-05-31 DIAGNOSIS — M19211 Secondary osteoarthritis, right shoulder: Secondary | ICD-10-CM | POA: Diagnosis not present

## 2016-05-31 NOTE — Progress Notes (Signed)
  Subjective:    CC: Bilateral shoulder pain  HPI: This is a pleasant 76 year old male, he has known bilateral glenohumeral osteoarthritis, he was not a candidate for reverse shoulder arthroplasty by Dr. Tamera Punt due to medical comorbidities. His last injection was 3 months ago and he desires a repeat. Pain is moderate, persistent, localized to the glenohumeral joint on both sides without radiation.  Past medical history:  Negative.  See flowsheet/record as well for more information.  Surgical history: Negative.  See flowsheet/record as well for more information.  Family history: Negative.  See flowsheet/record as well for more information.  Social history: Negative.  See flowsheet/record as well for more information.  Allergies, and medications have been entered into the medical record, reviewed, and no changes needed.   Review of Systems: No fevers, chills, night sweats, weight loss, chest pain, or shortness of breath.   Objective:    General: Well Developed, well nourished, and in no acute distress.  Neuro: Alert and oriented x3, extra-ocular muscles intact, sensation grossly intact.  HEENT: Normocephalic, atraumatic, pupils equal round reactive to light, neck supple, no masses, no lymphadenopathy, thyroid nonpalpable.  Skin: Warm and dry, no rashes. Cardiac: Regular rate and rhythm, no murmurs rubs or gallops, no lower extremity edema.  Respiratory: Clear to auscultation bilaterally. Not using accessory muscles, speaking in full sentences.  Procedure: Real-time Ultrasound Guided Injection of left glenohumeral joint Device: GE Logiq E  Verbal informed consent obtained.  Time-out conducted.  Noted no overlying erythema, induration, or other signs of local infection.  Skin prepped in a sterile fashion.  Local anesthesia: Topical Ethyl chloride.  With sterile technique and under real time ultrasound guidance:  1 mL kenalog 40, 2 mL lidocaine, 2 mL Marcaine injected easily. Completed  without difficulty  Pain immediately resolved suggesting accurate placement of the medication.  Advised to call if fevers/chills, erythema, induration, drainage, or persistent bleeding.  Images permanently stored and available for review in the ultrasound unit.  Impression: Technically successful ultrasound guided injection.  Procedure: Real-time Ultrasound Guided Injection of right glenohumeral joint Device: GE Logiq E  Verbal informed consent obtained.  Time-out conducted.  Noted no overlying erythema, induration, or other signs of local infection.  Skin prepped in a sterile fashion.  Local anesthesia: Topical Ethyl chloride.  With sterile technique and under real time ultrasound guidance:  1 mL kenalog 40, 2 mL lidocaine, 2 mL Marcaine injected easily. Completed without difficulty  Pain immediately resolved suggesting accurate placement of the medication.  Advised to call if fevers/chills, erythema, induration, drainage, or persistent bleeding.  Images permanently stored and available for review in the ultrasound unit.  Impression: Technically successful ultrasound guided injection.  Impression and Recommendations:    Bilateral glenohumeral osteoarthritis Previous injection was 3 months ago, repeat bilateral glenohumeral injection. Surgical risk was too high so reverse total shoulder arthroplasty was canceled by Dr. Tamera Punt.

## 2016-05-31 NOTE — Assessment & Plan Note (Signed)
Previous injection was 3 months ago, repeat bilateral glenohumeral injection. Surgical risk was too high so reverse total shoulder arthroplasty was canceled by Dr. Tamera Punt.

## 2016-06-17 DIAGNOSIS — M545 Low back pain: Secondary | ICD-10-CM | POA: Diagnosis not present

## 2016-06-17 DIAGNOSIS — M25551 Pain in right hip: Secondary | ICD-10-CM | POA: Diagnosis not present

## 2016-06-17 DIAGNOSIS — G894 Chronic pain syndrome: Secondary | ICD-10-CM | POA: Diagnosis not present

## 2016-06-17 DIAGNOSIS — M25512 Pain in left shoulder: Secondary | ICD-10-CM | POA: Diagnosis not present

## 2016-06-17 DIAGNOSIS — M79662 Pain in left lower leg: Secondary | ICD-10-CM | POA: Diagnosis not present

## 2016-06-17 DIAGNOSIS — M79661 Pain in right lower leg: Secondary | ICD-10-CM | POA: Diagnosis not present

## 2016-06-17 DIAGNOSIS — M25552 Pain in left hip: Secondary | ICD-10-CM | POA: Diagnosis not present

## 2016-06-17 DIAGNOSIS — Z79891 Long term (current) use of opiate analgesic: Secondary | ICD-10-CM | POA: Diagnosis not present

## 2016-06-17 DIAGNOSIS — M25511 Pain in right shoulder: Secondary | ICD-10-CM | POA: Diagnosis not present

## 2016-07-02 ENCOUNTER — Telehealth: Payer: Self-pay

## 2016-07-02 MED ORDER — TRAMADOL HCL 50 MG PO TABS: 50.0000 mg | ORAL_TABLET | Freq: Two times a day (BID) | ORAL | 0 refills | Status: DC

## 2016-07-02 NOTE — Telephone Encounter (Signed)
Wife of pt left VM stating pt is still in significant pain after injections and would like to get something prescribed for it. Please advise.

## 2016-07-02 NOTE — Telephone Encounter (Signed)
Adding tramadol.

## 2016-07-15 DIAGNOSIS — M545 Low back pain: Secondary | ICD-10-CM | POA: Diagnosis not present

## 2016-07-15 DIAGNOSIS — G890 Central pain syndrome: Secondary | ICD-10-CM | POA: Diagnosis not present

## 2016-07-15 DIAGNOSIS — M25552 Pain in left hip: Secondary | ICD-10-CM | POA: Diagnosis not present

## 2016-07-15 DIAGNOSIS — M25512 Pain in left shoulder: Secondary | ICD-10-CM | POA: Diagnosis not present

## 2016-07-15 DIAGNOSIS — M25551 Pain in right hip: Secondary | ICD-10-CM | POA: Diagnosis not present

## 2016-07-15 DIAGNOSIS — M25511 Pain in right shoulder: Secondary | ICD-10-CM | POA: Diagnosis not present

## 2016-07-15 DIAGNOSIS — G894 Chronic pain syndrome: Secondary | ICD-10-CM | POA: Diagnosis not present

## 2016-07-15 DIAGNOSIS — M79661 Pain in right lower leg: Secondary | ICD-10-CM | POA: Diagnosis not present

## 2016-07-22 ENCOUNTER — Ambulatory Visit: Payer: Medicare Other | Admitting: Osteopathic Medicine

## 2016-07-23 ENCOUNTER — Ambulatory Visit (INDEPENDENT_AMBULATORY_CARE_PROVIDER_SITE_OTHER): Payer: Medicare Other

## 2016-07-23 ENCOUNTER — Ambulatory Visit (INDEPENDENT_AMBULATORY_CARE_PROVIDER_SITE_OTHER): Payer: Medicare Other | Admitting: Osteopathic Medicine

## 2016-07-23 ENCOUNTER — Encounter: Payer: Self-pay | Admitting: Osteopathic Medicine

## 2016-07-23 VITALS — BP 130/83 | HR 97 | Ht 66.0 in | Wt 137.0 lb

## 2016-07-23 DIAGNOSIS — R634 Abnormal weight loss: Secondary | ICD-10-CM | POA: Diagnosis not present

## 2016-07-23 DIAGNOSIS — R0602 Shortness of breath: Secondary | ICD-10-CM | POA: Diagnosis not present

## 2016-07-23 DIAGNOSIS — L219 Seborrheic dermatitis, unspecified: Secondary | ICD-10-CM

## 2016-07-23 DIAGNOSIS — K449 Diaphragmatic hernia without obstruction or gangrene: Secondary | ICD-10-CM

## 2016-07-23 DIAGNOSIS — J449 Chronic obstructive pulmonary disease, unspecified: Secondary | ICD-10-CM | POA: Diagnosis not present

## 2016-07-23 DIAGNOSIS — I700 Atherosclerosis of aorta: Secondary | ICD-10-CM

## 2016-07-23 DIAGNOSIS — R5383 Other fatigue: Secondary | ICD-10-CM | POA: Diagnosis not present

## 2016-07-23 DIAGNOSIS — J441 Chronic obstructive pulmonary disease with (acute) exacerbation: Secondary | ICD-10-CM | POA: Diagnosis not present

## 2016-07-23 MED ORDER — PREDNISONE 20 MG PO TABS: 20.0000 mg | ORAL_TABLET | Freq: Two times a day (BID) | ORAL | 0 refills | Status: DC

## 2016-07-23 MED ORDER — IPRATROPIUM-ALBUTEROL 0.5-2.5 (3) MG/3ML IN SOLN: 3.0000 mL | RESPIRATORY_TRACT | 3 refills | Status: DC | PRN

## 2016-07-23 MED ORDER — NEBULIZER/TUBING/MOUTHPIECE KIT: PACK | 1 refills | Status: DC

## 2016-07-23 MED ORDER — NEBULIZERS MISC: 1 refills | Status: DC

## 2016-07-23 MED ORDER — KETOCONAZOLE 2 % EX SHAM: 1.0000 "application " | MEDICATED_SHAMPOO | CUTANEOUS | 2 refills | Status: DC

## 2016-07-23 MED ORDER — AZITHROMYCIN 250 MG PO TABS: ORAL_TABLET | ORAL | 0 refills | Status: DC

## 2016-07-23 NOTE — Patient Instructions (Addendum)
Plan: 1. Labs today 2. Xray today - no major concerns, will call if the radiologist says something different 3. For now, given breathing issues, will treat as COPD exacerbation  Nebulizer for inhaled medications - printed prescriptions  Steroids and antibiotics sent directly to your pharmacy   Oxygen 24/7! 4. Use oxygen 24/7 at home and we should consider getting you a portable oxygen device when you are out of the house  5. If weight loss and fatigue persist, we will need to do a scan to rule out cancer 6. Plan to follow-up in the office for recheck in 2 days with Dr. Sheppard Coil

## 2016-07-23 NOTE — Progress Notes (Signed)
HPI: [name]. is a 76 y.o. male  who presents to [facility] today, 07/24/16,  for chief complaint of:  Chief Complaint  Patient presents with  . Other    FATIGUE AND LOW HEART RATE    Fatigue/SOB x2 weeks. Known COPD - using O2 at night only, has declined inhaler medications thus far. No tachycardia/chest pain, no LE swelling. No HR measurements to confirm bradycardia but feels like "heart isn't beating fast enough." Associated with weight loss despite good appetite. Chills.    Past medical, surgical, social and family history reviewed:  Current medication list and allergy/intolerance information reviewed:   Current Outpatient Prescriptions  Medication Sig Dispense Refill  . albuterol (VENTOLIN HFA) 108 (90 BASE) MCG/ACT inhaler Inhale 2 puffs into the lungs every 6 (six) hours as needed for wheezing or shortness of breath. 1 Inhaler 11  . donepezil (ARICEPT) 10 MG tablet take 1 tablet by mouth once daily at bedtime 30 tablet 5  . DULoxetine (CYMBALTA) 30 MG capsule Take 90 mg by mouth every morning.     . fluticasone (FLONASE) 50 MCG/ACT nasal spray instill 2 sprays into each nostril once daily 16 g 11  . fluticasone (FLONASE) 50 MCG/ACT nasal spray instill 2 sprays into each nostril once daily 16 g 11  . lamoTRIgine (LAMICTAL) 100 MG tablet Take 50-100 mg by mouth 2 (two) times daily. 54m in the morning, and 1078min the evening    . nitroGLYCERIN (NITROSTAT) 0.4 MG SL tablet Place 1 tablet (0.4 mg total) under the tongue every 5 (five) minutes as needed. For chest pain 25 tablet 6  . omeprazole (PRILOSEC) 20 MG capsule Take 1 capsule (20 mg total) by mouth 2 (two) times daily before a meal. 60 capsule 11  . OXYGEN Inhale 2.5 L into the lungs at bedtime.    . pravastatin (PRAVACHOL) 80 MG tablet take 1 tablet by mouth once daily (Patient taking differently: take 1 tablet by mouth once at night) 90 tablet 3  . predniSONE (DELTASONE) 20 MG  tablet Take 1 tablet (20 mg total) by mouth 2 (two) times daily with a meal. 10 tablet 0  . PROAIR HFA 108 (90 Base) MCG/ACT inhaler inhale 1 to 2 puffs four times a day if needed 8.5 Inhaler 11  . PROAIR HFA 108 (90 Base) MCG/ACT inhaler inhale 1 to 2 puffs four times a day if needed 8.5 Inhaler 11  . traMADol (ULTRAM) 50 MG tablet Take 1 tablet (50 mg total) by mouth 2 (two) times daily. 30 tablet 0  . zolpidem (AMBIEN) 10 MG tablet Take 0.5 tablets (5 mg total) by mouth at bedtime as needed for sleep.    . Marland Kitchenzithromycin (ZITHROMAX) 250 MG tablet 2 tabs po on Day 1, then 1 tab daily Days 2 - 5 6 tablet 0  . ipratropium-albuterol (DUONEB) 0.5-2.5 (3) MG/3ML SOLN Take 3 mLs by nebulization every 2 (two) hours as needed (shortness of breath/cough). 180 mL 3  . [START ON 07/25/2016] ketoconazole (NIZORAL) 2 % shampoo Apply 1 application topically 2 (two) times a week. 120 mL 2  . Nebulizers MISC Please dispense nebulizer device per patient's insurance coverage. Sig: use with nebulized medications as directed. Dx: J96.11 1 each 1  . predniSONE (DELTASONE) 20 MG tablet Take 1 tablet (20 mg total) by mouth 2 (two) times daily with a meal. 10 tablet 0  . Respiratory Therapy Supplies (NEBULIZER/TUBING/MOUTHPIECE) KIT Please dispense all necessary nebulizer supplies per  patient's insurance coverage 1 each 1   No current facility-administered medications for this visit.    Allergies  Allergen Reactions  . Celecoxib Nausea And Vomiting    REACTION: GI upset  . Chicken Protein Other (See Comments)    Will not eat chicken  . Fish-Derived Products Other (See Comments)    Will not eat seafood of any kind  . Morphine Itching    REACTION: itching  . Nalbuphine Other (See Comments)    REACTION: contraindication with oxycontin.  . Prednisone Other (See Comments)    REACTION: "yeast infections"  . Venlafaxine Other (See Comments)    REACTION: GI upset      Review of Systems:  Constitutional:  No   fever, +chills, +recent illness, +unintentional weight changes. +significant fatigue.   HEENT: No  headache, no vision change, no hearing change, No sore throat, No  sinus pressure  Cardiac: No  chest pain, No  pressure, No palpitations, No  Orthopnea  Respiratory:  No  shortness of breath. No  Cough  Gastrointestinal: No  abdominal pain, No  nausea, No  vomiting,  No  blood in stool, No  diarrhea, No  constipation   Musculoskeletal: No new myalgia/arthralgia  Skin: +rash on scalp and scratch on L arm    Neurologic: No  weakness, No  dizziness, No  slurred speech/focal weakness/facial droop   Exam:  BP 130/83   Pulse 97   Ht '5\' 6"'  (1.676 m)   Wt 137 lb (62.1 kg)   SpO2 (!) 85%   BMI 22.11 kg/m  SpO2 as low as 81 after just sitting down but improved with deep breaths.   Constitutional: VS see above. General Appearance: alert, well-developed, well-nourished, NAD  Eyes: Normal lids and conjunctive, non-icteric sclera  Ears, Nose, Mouth, Throat: MMM, Normal external inspection ears/nares/mouth/lips/gums. TM normal bilaterally. Pharynx/tonsils no erythema, no exudate. Nasal mucosa normal.   Neck: No masses, trachea midline. No thyroid enlargement. No tenderness/mass appreciated. No lymphadenopathy  Respiratory: Increased respiratory effort but speaking in full sentences. Diffuse coarse breath sounds - stable from previous exams. No rales  Cardiovascular: S1/S2 normal, no murmur, no rub/gallop auscultated. RRR. No lower extremity edema.   Gastrointestinal: Nontender, no masses.   Musculoskeletal: Gait normal. No clubbing/cyanosis of digits.   Neurological: Normal balance/coordination. No tremor. No cranial nerve deficit on limited exam.   Skin: warm, dry. (+)scaling on scalp. (+)excoriation and hives on L forearm c/w hx itching and cat scratch. No drainage/ulcer   Psychiatric: Normal judgment/insight. Normal mood and affect. Oriented x3.    Dg Chest 2 View  Result Date:  07/23/2016 CLINICAL DATA:  Chronic shortness of breath for at least 20 years, current smoker. History of previous CABG as well as coronary stent placement and angioplasty, diabetes, COPD. EXAM: CHEST  2 VIEW COMPARISON:  PA and lateral chest x-ray of January 16, 2016 FINDINGS: The lungs remain hyper inflated with hemidiaphragm flattening. The interstitial markings remain coarse. There is no alveolar infiltrate. There is no pleural effusion nipple shadows are visible bilaterally. The heart and pulmonary vascularity are normal. The patient has undergone previous CABG. There is calcification in the wall of the aortic arch. There is a hiatal hernia. The bony thorax exhibits no acute abnormality. IMPRESSION: COPD with chronic pulmonary fibrotic-smoking related changes. No pneumonia nor other acute cardiopulmonary abnormality. Small hiatal hernia. Thoracic aortic atherosclerosis. Electronically Signed   By: David  Martinique M.D.   On: 07/23/2016 17:21     Results for orders placed or  performed in visit on 07/23/16 (from the past 72 hour(s))  CBC with Differential/Platelet     Status: Abnormal   Collection Time: 07/23/16  3:18 PM  Result Value Ref Range   WBC 5.8 3.8 - 10.8 K/uL   RBC 4.31 4.20 - 5.80 MIL/uL   Hemoglobin 12.1 (L) 13.2 - 17.1 g/dL   HCT 38.7 38.5 - 50.0 %   MCV 89.8 80.0 - 100.0 fL   MCH 28.1 27.0 - 33.0 pg   MCHC 31.3 (L) 32.0 - 36.0 g/dL   RDW 17.3 (H) 11.0 - 15.0 %   Platelets 363 140 - 400 K/uL   MPV 9.1 7.5 - 12.5 fL   Neutro Abs 3,596 1,500 - 7,800 cells/uL   Lymphs Abs 1,334 850 - 3,900 cells/uL   Monocytes Absolute 580 200 - 950 cells/uL   Eosinophils Absolute 232 15 - 500 cells/uL   Basophils Absolute 58 0 - 200 cells/uL   Neutrophils Relative % 62 %   Lymphocytes Relative 23 %   Monocytes Relative 10 %   Eosinophils Relative 4 %   Basophils Relative 1 %   Smear Review Criteria for review not met   COMPLETE METABOLIC PANEL WITH GFR     Status: Abnormal   Collection Time:  07/23/16  3:18 PM  Result Value Ref Range   Sodium 139 135 - 146 mmol/L   Potassium 4.9 3.5 - 5.3 mmol/L   Chloride 106 98 - 110 mmol/L   CO2 17 (L) 20 - 31 mmol/L   Glucose, Bld 73 65 - 99 mg/dL   BUN 32 (H) 7 - 25 mg/dL   Creat 1.41 (H) 0.70 - 1.18 mg/dL    Comment:   For patients > or = 74 years of age: The upper reference limit for Creatinine is approximately 13% higher for people identified as African-American.      Total Bilirubin 0.2 0.2 - 1.2 mg/dL   Alkaline Phosphatase 95 40 - 115 U/L   AST 17 10 - 35 U/L   ALT 9 9 - 46 U/L   Total Protein 7.4 6.1 - 8.1 g/dL   Albumin 4.1 3.6 - 5.1 g/dL   Calcium 9.2 8.6 - 10.3 mg/dL   GFR, Est African American 57 (L) >=60 mL/min   GFR, Est Non African American 49 (L) >=60 mL/min  TSH     Status: None   Collection Time: 07/23/16  3:18 PM  Result Value Ref Range   TSH 1.42 0.40 - 4.50 mIU/L  B Nat Peptide     Status: None (Preliminary result)   Collection Time: 07/23/16  3:18 PM  Result Value Ref Range   Brain Natriuretic Peptide  <100 pg/mL      ASSESSMENT/PLAN: Nebulizer treatment given in the office. Pt appears stable for discharge and would like to avoid the hospital at all costs - will send home with close follow-up and advised he needs to take nebulized medications and other treatments for COPD exacerbation. ER precautions reviewed.  Weight loss - Concern for infection, chronic hypoxemia, possible malignancy - would get full scan if not gaining - Plan: CBC with Differential/Platelet, COMPLETE METABOLIC PANEL WITH GFR, TSH, DG Chest 2 View  Fatigue, unspecified type - multifactorial - will get labs and XR as below - Plan: CBC with Differential/Platelet, COMPLETE METABOLIC PANEL WITH GFR, TSH, B Nat Peptide, DG Chest 2 View  COPD exacerbation (HCC) - Plan: Respiratory Therapy Supplies (NEBULIZER/TUBING/MOUTHPIECE) KIT, Nebulizers MISC, ipratropium-albuterol (DUONEB) 0.5-2.5 (3) MG/3ML SOLN, predniSONE (  DELTASONE) 20 MG tablet,  azithromycin (ZITHROMAX) 250 MG tablet  Shortness of breath - Plan: B Nat Peptide, DG Chest 2 View, Respiratory Therapy Supplies (NEBULIZER/TUBING/MOUTHPIECE) KIT, Nebulizers MISC, ipratropium-albuterol (DUONEB) 0.5-2.5 (3) MG/3ML SOLN  Seborrheic dermatitis of scalp - Plan: ketoconazole (NIZORAL) 2 % shampoo    Patient Instructions  Plan: 1. Labs today 2. Xray today - no major concerns, will call if the radiologist says something different 3. For now, given breathing issues, will treat as COPD exacerbation  Nebulizer for inhaled medications - printed prescriptions  Steroids and antibiotics sent directly to your pharmacy   Oxygen 24/7! 4. Use oxygen 24/7 at home and we should consider getting you a portable oxygen device when you are out of the house  5. If weight loss and fatigue persist, we will need to do a scan to rule out cancer 6. Plan to follow-up in the office for recheck in 2 days with Dr. Sheppard Coil     Visit summary with medication list and pertinent instructions was printed for patient to review. All questions at time of visit were answered - patient instructed to contact office with any additional concerns. ER/RTC precautions were reviewed with the patient. Follow-up plan: Return in about 2 days (around 07/25/2016) for breathing recheck .    Total time spent 40 minutes, greater than 50% of the visit was face to face counseling and coordinating care for diagnosis of The primary encounter diagnosis was Weight loss. Diagnoses of Fatigue, unspecified type, COPD exacerbation ([place]), Shortness of breath, and Seborrheic dermatitis of scalp were also pertinent to this visit. Marland Kitchen

## 2016-07-24 LAB — COMPLETE METABOLIC PANEL WITH GFR
ALBUMIN: 4.1 g/dL (ref 3.6–5.1)
ALK PHOS: 95 U/L (ref 40–115)
ALT: 9 U/L (ref 9–46)
AST: 17 U/L (ref 10–35)
BILIRUBIN TOTAL: 0.2 mg/dL (ref 0.2–1.2)
BUN: 32 mg/dL — ABNORMAL HIGH (ref 7–25)
CALCIUM: 9.2 mg/dL (ref 8.6–10.3)
CO2: 17 mmol/L — ABNORMAL LOW (ref 20–31)
Chloride: 106 mmol/L (ref 98–110)
Creat: 1.41 mg/dL — ABNORMAL HIGH (ref 0.70–1.18)
GFR, EST NON AFRICAN AMERICAN: 49 mL/min — AB (ref >=60)
GFR, Est African American: 57 mL/min — ABNORMAL LOW (ref >=60)
GLUCOSE: 73 mg/dL (ref 65–99)
Potassium: 4.9 mmol/L (ref 3.5–5.3)
SODIUM: 139 mmol/L (ref 135–146)
TOTAL PROTEIN: 7.4 g/dL (ref 6.1–8.1)

## 2016-07-24 LAB — CBC WITH DIFFERENTIAL/PLATELET
BASOS ABS: 58 {cells}/uL (ref 0–200)
Basophils Relative: 1 %
EOS ABS: 232 {cells}/uL (ref 15–500)
EOS PCT: 4 %
HCT: 38.7 % (ref 38.5–50.0)
HEMOGLOBIN: 12.1 g/dL — AB (ref 13.2–17.1)
LYMPHS ABS: 1334 {cells}/uL (ref 850–3900)
Lymphocytes Relative: 23 %
MCH: 28.1 pg (ref 27.0–33.0)
MCHC: 31.3 g/dL — ABNORMAL LOW (ref 32.0–36.0)
MCV: 89.8 fL (ref 80.0–100.0)
MPV: 9.1 fL (ref 7.5–12.5)
Monocytes Absolute: 580 cells/uL (ref 200–950)
Monocytes Relative: 10 %
NEUTROS ABS: 3596 {cells}/uL (ref 1500–7800)
Neutrophils Relative %: 62 %
Platelets: 363 10*3/uL (ref 140–400)
RBC: 4.31 MIL/uL (ref 4.20–5.80)
RDW: 17.3 % — ABNORMAL HIGH (ref 11.0–15.0)
WBC: 5.8 10*3/uL (ref 3.8–10.8)

## 2016-07-24 LAB — TSH: TSH: 1.42 m[IU]/L (ref 0.40–4.50)

## 2016-07-24 LAB — BRAIN NATRIURETIC PEPTIDE: BRAIN NATRIURETIC PEPTIDE: 36.2 pg/mL (ref <100)

## 2016-07-25 ENCOUNTER — Ambulatory Visit (INDEPENDENT_AMBULATORY_CARE_PROVIDER_SITE_OTHER): Payer: Medicare Other | Admitting: Osteopathic Medicine

## 2016-07-25 ENCOUNTER — Encounter: Payer: Self-pay | Admitting: Osteopathic Medicine

## 2016-07-25 VITALS — BP 125/71 | HR 91 | Wt 138.0 lb

## 2016-07-25 DIAGNOSIS — J441 Chronic obstructive pulmonary disease with (acute) exacerbation: Secondary | ICD-10-CM

## 2016-07-25 DIAGNOSIS — R634 Abnormal weight loss: Secondary | ICD-10-CM | POA: Diagnosis not present

## 2016-07-25 DIAGNOSIS — R0602 Shortness of breath: Secondary | ICD-10-CM | POA: Diagnosis not present

## 2016-07-25 NOTE — Progress Notes (Signed)
HPI: [name]. is a 76 y.o. male  who presents to [facility] today, 07/25/16,  for chief complaint of:  Chief Complaint  Patient presents with  . Follow-up    BREATHING CONCERNS    Seen 2 days ago, treated for COPD exacerbation. Oxygen saturation about point was in the 80s, patient is breathing much better today after being on steroids, still not gotten the nebulizer machine filled, [facility] said they weren't able to do it. Patient will be going to medical supply store later this afternoon.  Overall, fatigue has improved. Patient has gained a pound in the past 2 days since feeling a bit better. Uses home oxygen at night but wants to see if he could qualify for this 24/7.     Past medical history, surgical history, social history and family history reviewed. COPD pertinent. See list.    Current medication list and allergy/intolerance information reviewed.   Current Outpatient Prescriptions on File Prior to Visit  Medication Sig Dispense Refill  . albuterol (VENTOLIN HFA) 108 (90 BASE) MCG/ACT inhaler Inhale 2 puffs into the lungs every 6 (six) hours as needed for wheezing or shortness of breath. 1 Inhaler 11  . azithromycin (ZITHROMAX) 250 MG tablet 2 tabs po on Day 1, then 1 tab daily Days 2 - 5 6 tablet 0  . donepezil (ARICEPT) 10 MG tablet take 1 tablet by mouth once daily at bedtime 30 tablet 5  . DULoxetine (CYMBALTA) 30 MG capsule Take 90 mg by mouth every morning.     . fluticasone (FLONASE) 50 MCG/ACT nasal spray instill 2 sprays into each nostril once daily 16 g 11  . fluticasone (FLONASE) 50 MCG/ACT nasal spray instill 2 sprays into each nostril once daily 16 g 11  . ipratropium-albuterol (DUONEB) 0.5-2.5 (3) MG/3ML SOLN Take 3 mLs by nebulization every 2 (two) hours as needed (shortness of breath/cough). 180 mL 3  . ketoconazole (NIZORAL) 2 % shampoo Apply 1 application topically 2 (two) times a week. 120 mL 2  . lamoTRIgine  (LAMICTAL) 100 MG tablet Take 50-100 mg by mouth 2 (two) times daily. 28m in the morning, and 101min the evening    . Nebulizers MISC Please dispense nebulizer device per patient's insurance coverage. Sig: use with nebulized medications as directed. Dx: J96.11 1 each 1  . nitroGLYCERIN (NITROSTAT) 0.4 MG SL tablet Place 1 tablet (0.4 mg total) under the tongue every 5 (five) minutes as needed. For chest pain 25 tablet 6  . omeprazole (PRILOSEC) 20 MG capsule Take 1 capsule (20 mg total) by mouth 2 (two) times daily before a meal. 60 capsule 11  . OXYGEN Inhale 2.5 L into the lungs at bedtime.    . pravastatin (PRAVACHOL) 80 MG tablet take 1 tablet by mouth once daily (Patient taking differently: take 1 tablet by mouth once at night) 90 tablet 3  . predniSONE (DELTASONE) 20 MG tablet Take 1 tablet (20 mg total) by mouth 2 (two) times daily with a meal. 10 tablet 0  . predniSONE (DELTASONE) 20 MG tablet Take 1 tablet (20 mg total) by mouth 2 (two) times daily with a meal. 10 tablet 0  . PROAIR HFA 108 (90 Base) MCG/ACT inhaler inhale 1 to 2 puffs four times a day if needed 8.5 Inhaler 11  . PROAIR HFA 108 (90 Base) MCG/ACT inhaler inhale 1 to 2 puffs four times a day if needed 8.5 Inhaler 11  . Respiratory Therapy Supplies (NEBULIZER/TUBING/MOUTHPIECE) KIT  Please dispense all necessary nebulizer supplies per patient's insurance coverage 1 each 1  . traMADol (ULTRAM) 50 MG tablet Take 1 tablet (50 mg total) by mouth 2 (two) times daily. 30 tablet 0  . zolpidem (AMBIEN) 10 MG tablet Take 0.5 tablets (5 mg total) by mouth at bedtime as needed for sleep.     No current facility-administered medications on file prior to visit.    Allergies  Allergen Reactions  . Celecoxib Nausea And Vomiting    REACTION: GI upset  . Chicken Protein Other (See Comments)    Will not eat chicken  . Fish-Derived Products Other (See Comments)    Will not eat seafood of any kind  . Morphine Itching    REACTION:  itching  . Nalbuphine Other (See Comments)    REACTION: contraindication with oxycontin.  . Prednisone Other (See Comments)    REACTION: "yeast infections"  . Venlafaxine Other (See Comments)    REACTION: GI upset      Review of Systems:  Constitutional: +recent illness  HEENT: No  headache, no vision change  Cardiac: No  chest pain, No  pressure, No palpitations  Respiratory:  +shortness of breath. +chronic Cough  Musculoskeletal: No new myalgia/arthralgia  Neurologic: No  weakness, No  Dizziness   Exam:  BP 125/71   Pulse 91   Wt 138 lb (62.6 kg)   SpO2 97%   BMI 22.27 kg/m   Constitutional: VS see above. General Appearance: alert, well-developed, well-nourished, NAD  Eyes: Normal lids and conjunctive, non-icteric sclera  Ears, Nose, Mouth, Throat: MMM, Normal external inspection ears/nares/mouth/lips/gums.  Neck: No masses, trachea midline.   Respiratory: Normal respiratory effort. +diffuse bilateral wheeze, +diffuse coarse breath sounds bilaterally, no rales  Cardiovascular: S1/S2 normal, no murmur, no rub/gallop auscultated. RRR.   Musculoskeletal: Gait normal. Symmetric and independent movement of all extremities  Neurological: Normal balance/coordination. No tremor.  Skin: warm, dry, intact.   Psychiatric: Normal judgment/insight. Normal mood and affect. Oriented x3.    Recent Results (from the past 2160 hour(s))  CBC with Differential/Platelet     Status: Abnormal   Collection Time: 07/23/16  3:18 PM  Result Value Ref Range   WBC 5.8 3.8 - 10.8 K/uL   RBC 4.31 4.20 - 5.80 MIL/uL   Hemoglobin 12.1 (L) 13.2 - 17.1 g/dL   HCT 38.7 38.5 - 50.0 %   MCV 89.8 80.0 - 100.0 fL   MCH 28.1 27.0 - 33.0 pg   MCHC 31.3 (L) 32.0 - 36.0 g/dL   RDW 17.3 (H) 11.0 - 15.0 %   Platelets 363 140 - 400 K/uL   MPV 9.1 7.5 - 12.5 fL   Neutro Abs 3,596 1,500 - 7,800 cells/uL   Lymphs Abs 1,334 850 - 3,900 cells/uL   Monocytes Absolute 580 200 - 950 cells/uL    Eosinophils Absolute 232 15 - 500 cells/uL   Basophils Absolute 58 0 - 200 cells/uL   Neutrophils Relative % 62 %   Lymphocytes Relative 23 %   Monocytes Relative 10 %   Eosinophils Relative 4 %   Basophils Relative 1 %   Smear Review Criteria for review not met   COMPLETE METABOLIC PANEL WITH GFR     Status: Abnormal   Collection Time: 07/23/16  3:18 PM  Result Value Ref Range   Sodium 139 135 - 146 mmol/L   Potassium 4.9 3.5 - 5.3 mmol/L   Chloride 106 98 - 110 mmol/L   CO2 17 (L) 20 - 31  mmol/L   Glucose, Bld 73 65 - 99 mg/dL   BUN 32 (H) 7 - 25 mg/dL   Creat 1.41 (H) 0.70 - 1.18 mg/dL    Comment:   For patients > or = 74 years of age: The upper reference limit for Creatinine is approximately 13% higher for people identified as African-American.      Total Bilirubin 0.2 0.2 - 1.2 mg/dL   Alkaline Phosphatase 95 40 - 115 U/L   AST 17 10 - 35 U/L   ALT 9 9 - 46 U/L   Total Protein 7.4 6.1 - 8.1 g/dL   Albumin 4.1 3.6 - 5.1 g/dL   Calcium 9.2 8.6 - 10.3 mg/dL   GFR, Est African American 57 (L) >=60 mL/min   GFR, Est Non African American 49 (L) >=60 mL/min  TSH     Status: None   Collection Time: 07/23/16  3:18 PM  Result Value Ref Range   TSH 1.42 0.40 - 4.50 mIU/L  B Nat Peptide     Status: None   Collection Time: 07/23/16  3:18 PM  Result Value Ref Range   Brain Natriuretic Peptide 36.2 <100 pg/mL    Comment:   BNP levels increase with age in the general population with the highest values seen in individuals greater than 3 years of age. Reference: Joellyn Rued Cardiol 2002; 93:818-29.       Dg Chest 2 View  Result Date: 07/23/2016 CLINICAL DATA:  Chronic shortness of breath for at least 20 years, current smoker. History of previous CABG as well as coronary stent placement and angioplasty, diabetes, COPD. EXAM: CHEST  2 VIEW COMPARISON:  PA and lateral chest x-ray of January 16, 2016 FINDINGS: The lungs remain hyper inflated with hemidiaphragm flattening. The  interstitial markings remain coarse. There is no alveolar infiltrate. There is no pleural effusion nipple shadows are visible bilaterally. The heart and pulmonary vascularity are normal. The patient has undergone previous CABG. There is calcification in the wall of the aortic arch. There is a hiatal hernia. The bony thorax exhibits no acute abnormality. IMPRESSION: COPD with chronic pulmonary fibrotic-smoking related changes. No pneumonia nor other acute cardiopulmonary abnormality. Small hiatal hernia. Thoracic aortic atherosclerosis. Electronically Signed   By: David  Martinique M.D.   On: 07/23/2016 17:21    No flowsheet data found.     Testing for oxygen therapy   O2 sat at rest on room air: 97 If this is 88% or below, stop, no further testing is needed.  Test with exertion: O2 sat at rest on room air: 97, then O2 sat at exertion on room air: 92, then pt had to stop walking d/t fatigue   The last test must show improvement in oxygen saturation while the patient is exerting himself on the desired liter flow of oxygen.    ASSESSMENT/PLAN:   Need nebulizer, will consider repeat 6-minute walk when pt at baseline. Overall better than when I saw him few days ago. ?Wt loss d/t chronic hypoxia?   COPD exacerbation ([place]) - improved w/ treatment   Weight loss - recheck in one month, if dropping would initiate workup for malignancy    Follow-up plan: Return in about 4 weeks (around 08/22/2016) for weight check .  Visit summary with medication list and pertinent instructions was printed for patient to review, alert [place] if any changes needed. All questions at time of visit were answered - patient instructed to contact office with any additional concerns. ER/RTC precautions were reviewed  with the patient and understanding verbalized.

## 2016-07-30 ENCOUNTER — Other Ambulatory Visit: Payer: Self-pay | Admitting: *Deleted

## 2016-07-30 DIAGNOSIS — I2510 Atherosclerotic heart disease of native coronary artery without angina pectoris: Secondary | ICD-10-CM

## 2016-07-30 MED ORDER — NITROGLYCERIN 0.4 MG SL SUBL: 0.4000 mg | SUBLINGUAL_TABLET | SUBLINGUAL | 3 refills | Status: DC | PRN

## 2016-07-31 MED ORDER — NEBULIZER/TUBING/MOUTHPIECE KIT: PACK | 1 refills | Status: DC

## 2016-07-31 MED ORDER — IPRATROPIUM-ALBUTEROL 0.5-2.5 (3) MG/3ML IN SOLN: 3.0000 mL | RESPIRATORY_TRACT | 3 refills | Status: DC | PRN

## 2016-07-31 MED ORDER — NEBULIZERS MISC: 1 refills | Status: DC

## 2016-07-31 NOTE — Addendum Note (Signed)
Addended by: Doree Albee on: 07/31/2016 04:21 PM   Modules accepted: Orders

## 2016-08-02 ENCOUNTER — Telehealth: Payer: Self-pay | Admitting: Osteopathic Medicine

## 2016-08-02 DIAGNOSIS — R0602 Shortness of breath: Secondary | ICD-10-CM

## 2016-08-02 DIAGNOSIS — J441 Chronic obstructive pulmonary disease with (acute) exacerbation: Secondary | ICD-10-CM

## 2016-08-02 MED ORDER — IPRATROPIUM-ALBUTEROL 0.5-2.5 (3) MG/3ML IN SOLN: 3.0000 mL | Freq: Two times a day (BID) | RESPIRATORY_TRACT | 3 refills | Status: DC

## 2016-08-02 NOTE — Telephone Encounter (Signed)
Rx printed and will put in fax box

## 2016-08-02 NOTE — Telephone Encounter (Signed)
Received call from [facility] with Stefani Dama [phone]). States they need to clarify Rx directions for duoneb.  Insurance will not pay for the directions 2 times a day PRN. Insurance will not pay for anything greater than 4 times daily. If the PRN needs to be added, it will need to read "4 times daily and PRN).   Routing to PCP for new Rx to be written if appropriate. Fax to [phone].

## 2016-08-05 ENCOUNTER — Telehealth: Payer: Self-pay

## 2016-08-05 NOTE — Telephone Encounter (Signed)
Wife of pt called stating pain is increasing and he would like to know if he can have something for it. Please advise.

## 2016-08-05 NOTE — Telephone Encounter (Signed)
Is he still taking the tramadol? If so I can refill it. Max dose is 100 mg 3 times a day.

## 2016-08-06 ENCOUNTER — Other Ambulatory Visit: Payer: Self-pay | Admitting: Osteopathic Medicine

## 2016-08-06 ENCOUNTER — Other Ambulatory Visit: Payer: Self-pay | Admitting: Sports Medicine

## 2016-08-06 MED ORDER — TRAMADOL HCL 50 MG PO TABS: 50.0000 mg | ORAL_TABLET | Freq: Two times a day (BID) | ORAL | 0 refills | Status: DC

## 2016-08-06 NOTE — Telephone Encounter (Signed)
Pt is still taking tramadol but is out. Please assist.

## 2016-08-06 NOTE — Telephone Encounter (Signed)
rx in box 

## 2016-08-12 DIAGNOSIS — M79661 Pain in right lower leg: Secondary | ICD-10-CM | POA: Diagnosis not present

## 2016-08-12 DIAGNOSIS — M25511 Pain in right shoulder: Secondary | ICD-10-CM | POA: Diagnosis not present

## 2016-08-12 DIAGNOSIS — M25512 Pain in left shoulder: Secondary | ICD-10-CM | POA: Diagnosis not present

## 2016-08-12 DIAGNOSIS — M25551 Pain in right hip: Secondary | ICD-10-CM | POA: Diagnosis not present

## 2016-08-12 DIAGNOSIS — M545 Low back pain: Secondary | ICD-10-CM | POA: Diagnosis not present

## 2016-08-12 DIAGNOSIS — M25552 Pain in left hip: Secondary | ICD-10-CM | POA: Diagnosis not present

## 2016-08-12 DIAGNOSIS — M79662 Pain in left lower leg: Secondary | ICD-10-CM | POA: Diagnosis not present

## 2016-08-12 DIAGNOSIS — G894 Chronic pain syndrome: Secondary | ICD-10-CM | POA: Diagnosis not present

## 2016-08-13 DIAGNOSIS — F0281 Dementia in other diseases classified elsewhere with behavioral disturbance: Secondary | ICD-10-CM | POA: Diagnosis not present

## 2016-08-13 DIAGNOSIS — F332 Major depressive disorder, recurrent severe without psychotic features: Secondary | ICD-10-CM | POA: Diagnosis not present

## 2016-08-13 DIAGNOSIS — F411 Generalized anxiety disorder: Secondary | ICD-10-CM | POA: Diagnosis not present

## 2016-08-16 DIAGNOSIS — H2513 Age-related nuclear cataract, bilateral: Secondary | ICD-10-CM | POA: Diagnosis not present

## 2016-08-16 DIAGNOSIS — H5000 Unspecified esotropia: Secondary | ICD-10-CM | POA: Diagnosis not present

## 2016-08-16 DIAGNOSIS — Z7984 Long term (current) use of oral hypoglycemic drugs: Secondary | ICD-10-CM | POA: Diagnosis not present

## 2016-08-16 DIAGNOSIS — E119 Type 2 diabetes mellitus without complications: Secondary | ICD-10-CM | POA: Diagnosis not present

## 2016-08-22 ENCOUNTER — Encounter: Payer: Self-pay | Admitting: Osteopathic Medicine

## 2016-08-22 ENCOUNTER — Ambulatory Visit (INDEPENDENT_AMBULATORY_CARE_PROVIDER_SITE_OTHER): Payer: Medicare Other | Admitting: Osteopathic Medicine

## 2016-08-22 VITALS — BP 140/80 | HR 98 | Ht 66.0 in | Wt 139.0 lb

## 2016-08-22 DIAGNOSIS — J42 Unspecified chronic bronchitis: Secondary | ICD-10-CM | POA: Diagnosis not present

## 2016-08-22 DIAGNOSIS — I2510 Atherosclerotic heart disease of native coronary artery without angina pectoris: Secondary | ICD-10-CM | POA: Diagnosis not present

## 2016-08-22 MED ORDER — UMECLIDINIUM-VILANTEROL 62.5-25 MCG/INH IN AEPB: 1.0000 | INHALATION_SPRAY | Freq: Every day | RESPIRATORY_TRACT | 11 refills | Status: DC

## 2016-08-22 NOTE — Progress Notes (Signed)
HPI: [name]. is a 76 y.o. male  who presents to [place] today, 08/22/16,  for chief complaint of:  Chief Complaint  Patient presents with  . Follow-up    WEIGHT AND RECHECK BREATHING    Recently seen with concerns for fatigue/weight loss, long smoking history certainly puts him at risk for malignancy however was treated for COPD exacerbation and patient reports significant improvement after this. He was tested in the office for oxygen requirements, did not meet criteria for continuous oxygen use at that time. Weight at this point, has actually gained a pound. Given overall improvement, he would like to defer full scan at this point if possible and to see how things go over the next few weeks/months.   Patient is accompanied by wife who assists with history-taking.   Past medical history, surgical history, social history and family history reviewed.  Patient Active Problem List   Diagnosis Date Noted  . Acute encephalopathy 12/28/2015  . Diabetes mellitus with complication ([place]) 40/98/1191  . Hx of CABG 12/28/2015  . Dementia 12/28/2015  . H/O asbestos exposure 10/31/2015  . Abdominal pain, generalized   . Abnormal CT scan   . Loss of weight   . Chronic kidney disease 08/30/2015  . Lung nodule < 6cm on CT 08/29/2015  . Moderate protein-calorie malnutrition ([place]) 08/24/2015  . COPD with acute exacerbation ([place]) 08/24/2015  . Bilateral glenohumeral osteoarthritis 06/12/2015  . Rhinitis, allergic 06/12/2015  . COPD, severe ([place]) 05/10/2015  . Bradycardia 04/11/2015  . Insomnia 04/05/2015  . Risk for falls 04/05/2015  . Type 2 diabetes mellitus with circulatory disorder ([place]) 04/05/2015  . Onychomycosis 11/25/2014  . Tinea pedis 11/25/2014  . Acute low back pain 08/18/2013  . Chest pain, atypical 10/17/2012  . Chronic respiratory failure with hypoxia ([place]) 10/17/2012  . On home oxygen therapy 10/17/2012  . Hyperkalemia  10/17/2012  . Headache(784.0) 02/20/2012  . Weight loss 04/17/2011  . Bladder neck obstruction 01/17/2011  . Preventative health care 11/17/2010  . CONSTIPATION 06/12/2010  . FLATULENCE-GAS-BLOATING 06/12/2010  . SCIATICA, LEFT 05/31/2010  . PHIMOSIS 04/06/2009  . TOBACCO USER 01/17/2009  . DYSPHAGIA 07/25/2008  . Osteoarthritis 06/24/2008  . ANEMIA-IRON DEFICIENCY 05/20/2008  . Memory loss 05/18/2008  . DEGENERATIVE DISC DISEASE, LUMBAR SPINE 01/03/2008  . Anxiety state 06/24/2007  . DEPRESSION 06/24/2007  . BENIGN PROSTATIC HYPERTROPHY 06/24/2007  . HYPERSOMNIA 06/24/2007  . FATIGUE 06/24/2007  . Urinary hesitancy 06/24/2007  . COLONIC POLYPS 06/12/2007  . Diabetes ([place]) 06/12/2007  . HYPERLIPIDEMIA 06/12/2007  . Essential hypertension 06/12/2007  . ANGINA PECTORIS 06/12/2007  . Coronary atherosclerosis 06/12/2007  . PERIPHERAL VASCULAR DISEASE 06/12/2007  . ESOPHAGEAL STRICTURE 06/12/2007  . GERD 06/12/2007  . DIVERTICULOSIS, COLON 06/12/2007  . HERNIATED DISC 06/12/2007  . SPINAL STENOSIS 06/12/2007  . Lumbago 06/12/2007    Current medication list and allergy/intolerance information reviewed.   Current Outpatient Prescriptions on File Prior to Visit  Medication Sig Dispense Refill  . albuterol (VENTOLIN HFA) 108 (90 BASE) MCG/ACT inhaler Inhale 2 puffs into the lungs every 6 (six) hours as needed for wheezing or shortness of breath. 1 Inhaler 11  . azithromycin (ZITHROMAX) 250 MG tablet 2 tabs po on Day 1, then 1 tab daily Days 2 - 5 6 tablet 0  . donepezil (ARICEPT) 10 MG tablet TAKE ONE TABLET BY MOUTH AT BEDTIME 90 tablet 0  . DULoxetine (CYMBALTA) 30 MG capsule Take 90 mg by mouth every morning.     Marland Kitchen  fluticasone (FLONASE) 50 MCG/ACT nasal spray instill 2 sprays into each nostril once daily 16 g 11  . fluticasone (FLONASE) 50 MCG/ACT nasal spray instill 2 sprays into each nostril once daily 16 g 11  . ipratropium-albuterol (DUONEB) 0.5-2.5 (3) MG/3ML SOLN Take 3  mLs by nebulization 2 (two) times daily. And every 4 hours as needed for shortness of breath 180 mL 3  . ketoconazole (NIZORAL) 2 % shampoo Apply 1 application topically 2 (two) times a week. 120 mL 2  . lamoTRIgine (LAMICTAL) 100 MG tablet Take 50-100 mg by mouth 2 (two) times daily. 52m in the morning, and 1036min the evening    . Nebulizers MISC Please dispense nebulizer device per patient's insurance coverage. Sig: use with nebulized medications as directed. Dx: J96.11 1 each 1  . nitroGLYCERIN (NITROSTAT) 0.4 MG SL tablet Place 1 tablet (0.4 mg total) under the tongue every 5 (five) minutes as needed. For chest pain 25 tablet 3  . omeprazole (PRILOSEC) 20 MG capsule Take 1 capsule (20 mg total) by mouth 2 (two) times daily before a meal. 60 capsule 11  . OXYGEN Inhale 2.5 L into the lungs at bedtime.    . pravastatin (PRAVACHOL) 80 MG tablet take 1 tablet by mouth once daily (Patient taking differently: take 1 tablet by mouth once at night) 90 tablet 3  . predniSONE (DELTASONE) 20 MG tablet Take 1 tablet (20 mg total) by mouth 2 (two) times daily with a meal. 10 tablet 0  . predniSONE (DELTASONE) 20 MG tablet Take 1 tablet (20 mg total) by mouth 2 (two) times daily with a meal. 10 tablet 0  . PROAIR HFA 108 (90 Base) MCG/ACT inhaler inhale 1 to 2 puffs four times a day if needed 8.5 Inhaler 11  . PROAIR HFA 108 (90 Base) MCG/ACT inhaler inhale 1 to 2 puffs four times a day if needed 8.5 Inhaler 11  . Respiratory Therapy Supplies (NEBULIZER/TUBING/MOUTHPIECE) KIT Please dispense all necessary nebulizer supplies per patient's insurance coverage 1 each 1  . traMADol (ULTRAM) 50 MG tablet Take 1 tablet (50 mg total) by mouth 2 (two) times daily. 30 tablet 0  . zolpidem (AMBIEN) 10 MG tablet Take 0.5 tablets (5 mg total) by mouth at bedtime as needed for sleep.     No current facility-administered medications on file prior to visit.    Allergies  Allergen Reactions  . Celecoxib Nausea And  Vomiting    REACTION: GI upset  . Chicken Protein Other (See Comments)    Will not eat chicken  . Fish-Derived Products Other (See Comments)    Will not eat seafood of any kind  . Morphine Itching    REACTION: itching  . Nalbuphine Other (See Comments)    REACTION: contraindication with oxycontin.  . Prednisone Other (See Comments)    REACTION: "yeast infections"  . Venlafaxine Other (See Comments)    REACTION: GI upset      Review of Systems:  Constitutional: +recent illness, feeling better  Cardiac: No  chest pain, No  pressure, No palpitations  Respiratory:  +chronic shortness of breath. +chronic Cough  Gastrointestinal: No  abdominal pain, no change on bowel habits  Musculoskeletal: No new myalgia/arthralgia  Skin: No  Rash  Neurologic: No  weakness, No  Dizziness   Exam:  BP 140/80   Pulse 98   Ht '5\' 6"'  (1.676 m)   Wt 139 lb (63 kg)   BMI 22.44 kg/m   [employer] 08/22/16  1401  Weight: 139 lb (63 kg)  Weight 07/25/2016: 138 lbs  Constitutional: VS see above. General Appearance: alert, well-developed, well-nourished, NAD  Eyes: Normal lids and conjunctive, non-icteric sclera  Ears, Nose, Mouth, Throat: MMM, Normal external inspection ears/nares/mouth/lips/gums.  Neck: No masses, trachea midline.   Respiratory: Normal respiratory effort. Diffuse bilateral wheeze/rhonchi, consistent with previous exams.  Cardiovascular: S1/S2 normal, no murmur, no rub/gallop auscultated. RRR.   Musculoskeletal: Gait normal. Symmetric and independent movement of all extremities  Neurological: Normal balance/coordination. No tremor.  Skin: warm, dry, intact.   Psychiatric: Normal judgment/insight. Normal mood and affect. Oriented x3.      ASSESSMENT/PLAN:   Patient has gained a bit of weight since last visit and overall is feeling well. We will continue follow-up for routine care, I believe his symptoms at last point were due to untreated COPD exacerbation.  Patient reluctant to get CT done for lung cancer screening, we'll revisit this again at next visit, history of lung nodules on previous CTs  Follow-up in one month for routine recheck and we'll repeat labs at that point. Adding inhaled medication at this time for hopefully improved COPD control, consider repeat evaluation for increasing frequency of home oxygen use the patient defers at this time  Chronic bronchitis, unspecified chronic bronchitis type ([place]) - Plan: umeclidinium-vilanterol (ANORO ELLIPTA) 62.5-25 MCG/INH AEPB    Follow-up plan: Return in about 6 weeks (around 10/03/2016) for repeat labs and recheck breathing .  Visit summary with medication list and pertinent instructions was printed for patient to review, alert [place] if any changes needed. All questions at time of visit were answered - patient instructed to contact office with any additional concerns. ER/RTC precautions were reviewed with the patient and understanding verbalized.

## 2016-09-03 ENCOUNTER — Other Ambulatory Visit: Payer: Self-pay | Admitting: Sports Medicine

## 2016-09-09 ENCOUNTER — Encounter: Payer: Self-pay | Admitting: Sports Medicine

## 2016-09-09 ENCOUNTER — Ambulatory Visit (INDEPENDENT_AMBULATORY_CARE_PROVIDER_SITE_OTHER): Payer: Medicare Other | Admitting: Sports Medicine

## 2016-09-09 DIAGNOSIS — M19211 Secondary osteoarthritis, right shoulder: Secondary | ICD-10-CM

## 2016-09-09 DIAGNOSIS — I2510 Atherosclerotic heart disease of native coronary artery without angina pectoris: Secondary | ICD-10-CM

## 2016-09-09 DIAGNOSIS — M19212 Secondary osteoarthritis, left shoulder: Principal | ICD-10-CM

## 2016-09-09 NOTE — Assessment & Plan Note (Signed)
Left side has not helped in the past so we only injected the right glenohumeral joint today.  Return as needed.

## 2016-09-09 NOTE — Progress Notes (Signed)
  Subjective:    CC: Shoulder pain  HPI: This is a pleasant 76 year old male with known bilateral glenohumeral osteoarthritis, injection into his left shoulder the last visit provided no relief, he did get some good relief on the right side, requests repeat interventional treatment today, pain is moderate, persistent, localized without radiation.  Past medical history:  Negative.  See flowsheet/record as well for more information.  Surgical history: Negative.  See flowsheet/record as well for more information.  Family history: Negative.  See flowsheet/record as well for more information.  Social history: Negative.  See flowsheet/record as well for more information.  Allergies, and medications have been entered into the medical record, reviewed, and no changes needed.   Review of Systems: No fevers, chills, night sweats, weight loss, chest pain, or shortness of breath.   Objective:    General: Well Developed, well nourished, and in no acute distress.  Neuro: Alert and oriented x3, extra-ocular muscles intact, sensation grossly intact.  HEENT: Normocephalic, atraumatic, pupils equal round reactive to light, neck supple, no masses, no lymphadenopathy, thyroid nonpalpable.  Skin: Warm and dry, no rashes. Cardiac: Regular rate and rhythm, no murmurs rubs or gallops, no lower extremity edema.  Respiratory: Clear to auscultation bilaterally. Not using accessory muscles, speaking in full sentences.  Procedure: Real-time Ultrasound Guided Injection of Right glenohumeral joint Device: GE Logiq E  Verbal informed consent obtained.  Time-out conducted.  Noted no overlying erythema, induration, or other signs of local infection.  Skin prepped in a sterile fashion.  Local anesthesia: Topical Ethyl chloride.  With sterile technique and under real time ultrasound guidance:  Using a 22-gauge spinal needle advanced into the joint and injected 1 mL Kenalog 40, 2 mL lidocaine, 2 mL bupivacaine. Completed  without difficulty  Pain immediately resolved suggesting accurate placement of the medication.  Advised to call if fevers/chills, erythema, induration, drainage, or persistent bleeding.  Images permanently stored and available for review in the ultrasound unit.  Impression: Technically successful ultrasound guided injection.  Impression and Recommendations:    Bilateral glenohumeral osteoarthritis Left side has not helped in the past so we only injected the right glenohumeral joint today.  Return as needed.

## 2016-09-12 DIAGNOSIS — M79661 Pain in right lower leg: Secondary | ICD-10-CM | POA: Diagnosis not present

## 2016-09-12 DIAGNOSIS — M545 Low back pain: Secondary | ICD-10-CM | POA: Diagnosis not present

## 2016-09-12 DIAGNOSIS — M79662 Pain in left lower leg: Secondary | ICD-10-CM | POA: Diagnosis not present

## 2016-09-12 DIAGNOSIS — M25512 Pain in left shoulder: Secondary | ICD-10-CM | POA: Diagnosis not present

## 2016-09-12 DIAGNOSIS — M25551 Pain in right hip: Secondary | ICD-10-CM | POA: Diagnosis not present

## 2016-09-12 DIAGNOSIS — G894 Chronic pain syndrome: Secondary | ICD-10-CM | POA: Diagnosis not present

## 2016-09-12 DIAGNOSIS — M25511 Pain in right shoulder: Secondary | ICD-10-CM | POA: Diagnosis not present

## 2016-09-12 DIAGNOSIS — M25552 Pain in left hip: Secondary | ICD-10-CM | POA: Diagnosis not present

## 2016-09-26 ENCOUNTER — Other Ambulatory Visit: Payer: Self-pay | Admitting: Sports Medicine

## 2016-10-02 ENCOUNTER — Ambulatory Visit (INDEPENDENT_AMBULATORY_CARE_PROVIDER_SITE_OTHER): Payer: Medicare Other | Admitting: Osteopathic Medicine

## 2016-10-02 ENCOUNTER — Other Ambulatory Visit: Payer: Self-pay | Admitting: Osteopathic Medicine

## 2016-10-02 ENCOUNTER — Encounter: Payer: Self-pay | Admitting: Osteopathic Medicine

## 2016-10-02 VITALS — BP 118/66 | HR 55 | Wt 144.0 lb

## 2016-10-02 DIAGNOSIS — D649 Anemia, unspecified: Secondary | ICD-10-CM

## 2016-10-02 DIAGNOSIS — J42 Unspecified chronic bronchitis: Secondary | ICD-10-CM

## 2016-10-02 DIAGNOSIS — G250 Essential tremor: Secondary | ICD-10-CM | POA: Diagnosis not present

## 2016-10-02 DIAGNOSIS — E875 Hyperkalemia: Secondary | ICD-10-CM | POA: Diagnosis not present

## 2016-10-02 DIAGNOSIS — N183 Chronic kidney disease, stage 3 unspecified: Secondary | ICD-10-CM

## 2016-10-02 DIAGNOSIS — I2510 Atherosclerotic heart disease of native coronary artery without angina pectoris: Secondary | ICD-10-CM | POA: Diagnosis not present

## 2016-10-02 LAB — CBC
HCT: 34.5 % — ABNORMAL LOW (ref 38.5–50.0)
Hemoglobin: 10.8 g/dL — ABNORMAL LOW (ref 13.2–17.1)
MCH: 28.3 pg (ref 27.0–33.0)
MCHC: 31.3 g/dL — ABNORMAL LOW (ref 32.0–36.0)
MCV: 90.3 fL (ref 80.0–100.0)
MPV: 9 fL (ref 7.5–12.5)
PLATELETS: 307 10*3/uL (ref 140–400)
RBC: 3.82 MIL/uL — AB (ref 4.20–5.80)
RDW: 17.4 % — ABNORMAL HIGH (ref 11.0–15.0)
WBC: 5.4 10*3/uL (ref 3.8–10.8)

## 2016-10-02 NOTE — Progress Notes (Signed)
HPI: [name]. is a 76 y.o. male  who presents to [place] today, 10/02/16,  for chief complaint of:  Chief Complaint  Patient presents with  . Follow-up    Weight seems to have stabilized. Patient states that he is eating well, breathing well. No concerns.   COPD: Stable on current medications, patient reports chronic shortness of breath with dyspnea but not interested in oxygen at this point, compliant with inhalers.   Other: Reports occasional tremor, worried about whether this might be due to nerves/anxiety but he denies any anxiety/depression type symptoms.   Patient is accompanied by wife who assists with history-taking.   Past medical history, surgical history, social history and family history reviewed.  Patient Active Problem List   Diagnosis Date Noted  . Acute encephalopathy 12/28/2015  . Diabetes mellitus with complication ([place]) 87/56/4332  . Hx of CABG 12/28/2015  . Dementia 12/28/2015  . H/O asbestos exposure 10/31/2015  . Abdominal pain, generalized   . Abnormal CT scan   . Loss of weight   . Chronic kidney disease 08/30/2015  . Lung nodule < 6cm on CT 08/29/2015  . Moderate protein-calorie malnutrition ([place]) 08/24/2015  . COPD with acute exacerbation ([place]) 08/24/2015  . Bilateral glenohumeral osteoarthritis 06/12/2015  . Rhinitis, allergic 06/12/2015  . COPD, severe ([place]) 05/10/2015  . Bradycardia 04/11/2015  . Insomnia 04/05/2015  . Risk for falls 04/05/2015  . Type 2 diabetes mellitus with circulatory disorder ([place]) 04/05/2015  . Onychomycosis 11/25/2014  . Tinea pedis 11/25/2014  . Acute low back pain 08/18/2013  . Chest pain, atypical 10/17/2012  . Chronic respiratory failure with hypoxia ([place]) 10/17/2012  . On home oxygen therapy 10/17/2012  . Hyperkalemia 10/17/2012  . Headache(784.0) 02/20/2012  . Weight loss 04/17/2011  . Bladder neck obstruction 01/17/2011  . Preventative health care 11/17/2010   . CONSTIPATION 06/12/2010  . FLATULENCE-GAS-BLOATING 06/12/2010  . SCIATICA, LEFT 05/31/2010  . PHIMOSIS 04/06/2009  . TOBACCO USER 01/17/2009  . DYSPHAGIA 07/25/2008  . Osteoarthritis 06/24/2008  . ANEMIA-IRON DEFICIENCY 05/20/2008  . Memory loss 05/18/2008  . DEGENERATIVE DISC DISEASE, LUMBAR SPINE 01/03/2008  . Anxiety state 06/24/2007  . DEPRESSION 06/24/2007  . BENIGN PROSTATIC HYPERTROPHY 06/24/2007  . HYPERSOMNIA 06/24/2007  . FATIGUE 06/24/2007  . Urinary hesitancy 06/24/2007  . COLONIC POLYPS 06/12/2007  . Diabetes ([place]) 06/12/2007  . HYPERLIPIDEMIA 06/12/2007  . Essential hypertension 06/12/2007  . ANGINA PECTORIS 06/12/2007  . Coronary atherosclerosis 06/12/2007  . PERIPHERAL VASCULAR DISEASE 06/12/2007  . ESOPHAGEAL STRICTURE 06/12/2007  . GERD 06/12/2007  . DIVERTICULOSIS, COLON 06/12/2007  . HERNIATED DISC 06/12/2007  . SPINAL STENOSIS 06/12/2007  . Lumbago 06/12/2007    Current medication list and allergy/intolerance information reviewed.   Current Outpatient Prescriptions on File Prior to Visit  Medication Sig Dispense Refill  . albuterol (VENTOLIN HFA) 108 (90 BASE) MCG/ACT inhaler Inhale 2 puffs into the lungs every 6 (six) hours as needed for wheezing or shortness of breath. 1 Inhaler 11  . donepezil (ARICEPT) 10 MG tablet TAKE ONE TABLET BY MOUTH AT BEDTIME 90 tablet 0  . DULoxetine (CYMBALTA) 30 MG capsule Take 90 mg by mouth every morning.     . fluticasone (FLONASE) 50 MCG/ACT nasal spray instill 2 sprays into each nostril once daily 16 g 11  . fluticasone (FLONASE) 50 MCG/ACT nasal spray instill 2 sprays into each nostril once daily 16 g 11  . ipratropium-albuterol (DUONEB) 0.5-2.5 (3) MG/3ML SOLN Take 3 mLs by  nebulization 2 (two) times daily. And every 4 hours as needed for shortness of breath 180 mL 3  . ketoconazole (NIZORAL) 2 % shampoo Apply 1 application topically 2 (two) times a week. 120 mL 2  . lamoTRIgine (LAMICTAL) 100 MG tablet Take  50-100 mg by mouth 2 (two) times daily. 36m in the morning, and 1037min the evening    . Nebulizers MISC Please dispense nebulizer device per patient's insurance coverage. Sig: use with nebulized medications as directed. Dx: J96.11 1 each 1  . nitroGLYCERIN (NITROSTAT) 0.4 MG SL tablet Place 1 tablet (0.4 mg total) under the tongue every 5 (five) minutes as needed. For chest pain 25 tablet 3  . omeprazole (PRILOSEC) 20 MG capsule Take 1 capsule (20 mg total) by mouth 2 (two) times daily before a meal. 60 capsule 11  . OXYGEN Inhale 2.5 L into the lungs at bedtime.    . pravastatin (PRAVACHOL) 80 MG tablet take 1 tablet by mouth once daily (Patient taking differently: take 1 tablet by mouth once at night) 90 tablet 3  . PROAIR HFA 108 (90 Base) MCG/ACT inhaler inhale 1 to 2 puffs four times a day if needed 8.5 Inhaler 11  . Respiratory Therapy Supplies (NEBULIZER/TUBING/MOUTHPIECE) KIT Please dispense all necessary nebulizer supplies per patient's insurance coverage 1 each 1  . traMADol (ULTRAM) 50 MG tablet TAKE 1 TABLET BY MOUTH TWICE DAILY 30 tablet 0  . umeclidinium-vilanterol (ANORO ELLIPTA) 62.5-25 MCG/INH AEPB Inhale 1 puff into the lungs daily. 1 each 11  . zolpidem (AMBIEN) 10 MG tablet Take 0.5 tablets (5 mg total) by mouth at bedtime as needed for sleep.     No current facility-administered medications on file prior to visit.    Allergies  Allergen Reactions  . Celecoxib Nausea And Vomiting    REACTION: GI upset  . Chicken Protein Other (See Comments)    Will not eat chicken  . Fish-Derived Products Other (See Comments)    Will not eat seafood of any kind  . Morphine Itching    REACTION: itching  . Nalbuphine Other (See Comments)    REACTION: contraindication with oxycontin.  . Prednisone Other (See Comments)    REACTION: "yeast infections"  . Venlafaxine Other (See Comments)    REACTION: GI upset      Review of Systems:  Constitutional: +recent illness, feeling  better  Cardiac: No  chest pain, No  pressure, No palpitations  Respiratory:  +chronic shortness of breath. +chronic Cough  Gastrointestinal: No  abdominal pain, no change on bowel habits  Musculoskeletal: No new myalgia/arthralgia  Skin: No  Rash  Neurologic: No  weakness, No  Dizziness   Exam:  BP 118/66   Pulse (!) 55   Wt 144 lb (65.3 kg)   SpO2 98%   BMI 23.24 kg/m   Filed Weights   10/02/16 1325  Weight: 144 lb (65.3 kg)  Weight 07/25/2016: 138 lbs   Constitutional: VS see above. General Appearance: alert, well-developed, well-nourished, NAD  Eyes: Normal lids and conjunctive, non-icteric sclera  Ears, Nose, Mouth, Throat: MMM, Normal external inspection ears/nares/mouth/lips/gums.  Neck: No masses, trachea midline.   Respiratory: Normal respiratory effort. Diffuse bilateral wheeze/rhonchi, consistent with previous exams.  Cardiovascular: S1/S2 normal, no murmur, no rub/gallop auscultated. RRR.   Musculoskeletal: Gait normal. Symmetric and independent movement of all extremities  Neurological: Normal balance/coordination. +tremor when holding out hands but nothing at rest.   Skin: warm, dry, intact.   Psychiatric: Normal judgment/insight. Normal mood and  affect. Oriented x3.      ASSESSMENT/PLAN:   Patient Instructions  Plan: Continue current inhalers Will recheck anemia Plan for CT lungs in September for cancer screening Come see me sooner if needed!     Anemia, unspecified type - Plan: CBC, COMPLETE METABOLIC PANEL WITH GFR  Chronic bronchitis, unspecified chronic bronchitis type (HCC)  Chronic kidney disease, stage 3 (moderate)  Essential tremor - avoid BB given low BP already - consider futher wu but no s/s parkinsons or other neruo problems at this time     Follow-up plan: Return in about 3 months (around 01/02/2017) for recheck COPD .  Visit summary with medication list and pertinent instructions was printed for patient to review,  alert [place] if any changes needed. All questions at time of visit were answered - patient instructed to contact office with any additional concerns. ER/RTC precautions were reviewed with the patient and understanding verbalized.

## 2016-10-02 NOTE — Patient Instructions (Addendum)
Plan: Continue current inhalers Will recheck anemia Plan for CT lungs in September for cancer screening Come see me sooner if needed!

## 2016-10-03 LAB — COMPLETE METABOLIC PANEL WITH GFR
ALT: 10 U/L (ref 9–46)
AST: 18 U/L (ref 10–35)
Albumin: 4 g/dL (ref 3.6–5.1)
Alkaline Phosphatase: 94 U/L (ref 40–115)
BILIRUBIN TOTAL: 0.2 mg/dL (ref 0.2–1.2)
BUN: 21 mg/dL (ref 7–25)
CO2: 19 mmol/L — AB (ref 20–31)
CREATININE: 1.32 mg/dL — AB (ref 0.70–1.18)
Calcium: 8.7 mg/dL (ref 8.6–10.3)
Chloride: 108 mmol/L (ref 98–110)
GFR, EST AFRICAN AMERICAN: 61 mL/min (ref >=60)
GFR, Est Non African American: 53 mL/min — ABNORMAL LOW (ref >=60)
GLUCOSE: 80 mg/dL (ref 65–99)
Potassium: 5.4 mmol/L — ABNORMAL HIGH (ref 3.5–5.3)
SODIUM: 141 mmol/L (ref 135–146)
TOTAL PROTEIN: 6.8 g/dL (ref 6.1–8.1)

## 2016-10-04 NOTE — Addendum Note (Signed)
Addended by: Maryla Morrow on: 10/04/2016 02:05 PM   Modules accepted: Orders

## 2016-10-05 LAB — IRON AND TIBC
%SAT: 8 % — ABNORMAL LOW (ref 15–60)
IRON: 32 ug/dL — AB (ref 50–180)
TIBC: 412 ug/dL (ref 250–425)
UIBC: 380 ug/dL

## 2016-10-05 LAB — FERRITIN: FERRITIN: 9 ng/mL — AB (ref 20–380)

## 2016-10-07 ENCOUNTER — Other Ambulatory Visit: Payer: Self-pay

## 2016-10-07 ENCOUNTER — Other Ambulatory Visit: Payer: Self-pay | Admitting: Osteopathic Medicine

## 2016-10-07 MED ORDER — FERROUS SULFATE 325 (65 FE) MG PO TBEC: 325.0000 mg | DELAYED_RELEASE_TABLET | Freq: Every day | ORAL | 1 refills | Status: DC

## 2016-10-07 NOTE — Progress Notes (Signed)
Restarting iron

## 2016-10-07 NOTE — Addendum Note (Signed)
Addended by: Quinn Plowman E on: 10/07/2016 10:35 AM   Modules accepted: Orders

## 2016-10-07 NOTE — Patient Outreach (Signed)
[facility]) Care Management  10/07/2016  Tyler CURE Sr. May 14, 1940 [id number]  TELEPHONE SCREENING Referral date: 10/01/16 Referral source: EMMI prevent call Referral reason: EMMI prevent score of 10 Insurance: Medicare Attempt #1  SUBJECTIVE: Telephone call to patient regarding EMMI prevent referral. HIPAA verified with patient.  Patient very hard of hearing and requested wife speak with his wife, [name] regarding all of her personal health information.  Wife states she is primary care giver for patient.  Wife states patient has arthritis very bad which has made it difficult for him to get around. Wife states patients sees a pain management doctor for his arthritis. Wife states patient has had physical therapy and injections in the past with no relief.  Wife states patient has COPD. States patient becomes short of breath frequently. Wife states patient uses his nebulizer 1 time per day and uses his inhaler as need. Wife states patient saw a pulmonologist, Dr. Ashok Cordia in the past but does not seem him routinely. Wife states patient sees his cardiologist, Dr. Angelena Form yearly. Wife states patient has had 5 stents place and bypass surgery in the past. Wife reports patient wears his oxygen at 2.5 liters at night only.  Wife states patient is on approximately 10 medications. Wife states it is difficulty to afford patients medications overall. Wife states patients pharmacy is [facility].  Wife confirms patient does have diabetes.  States patients primary MD discontinued patients oral medication.  Wife states patient is monitoring his diabetes by diet at this time.  RNCM discussed and offered [facility] care management program to wife for patient. Wife verbally agreed to follow up with [facility] health coach for disease management and education and pharmacist for medication assistance.  RNCM advised patient to notify MD of any changes in condition prior to scheduled appointment. RNCM  provided contact name and number of main office number [phone] and 24 hour nurse advise line [phone].  RNCM verified patient aware of 911 services for urgent/ emergent needs.,  PROVIDERS:  Dr. Sheppard Coil - primary MD Dr. Angelena Form - cardiologist Dr. Angie Fava- pain management Dr. Ashok Cordia - pulmonologist.   SOCIAL/ SUPPORTIVE CARE: Patient lives with spouse who is primary care taker   ASSESSMENT; Patient and/or spouse will benefit from disease management for COPD with health coach. Patient will benefit from pharmacy referral for medication assistance.   PLAN: RNCM will refer patient social worker and pharmacist.   Quinn Plowman RN,BSN,CCM [facility] Telephonic  [phone]

## 2016-10-08 ENCOUNTER — Other Ambulatory Visit: Payer: Self-pay

## 2016-10-08 NOTE — Patient Outreach (Signed)
[facility] ([facility]) Care Management  [facility] Care Manager  10/08/2016   Tyler E Curling Sr. 09/18/1940 [id number]  Subjective: Telephone call to wife [name] who is the caregiver.  She states that patient is doing ok today. She states they live in their home together. She reports that patient is hard of hearing and has some memory issues. Patient able to bath and dress himself but she does the driving, housework etc.  She reports that he also has some vision issues and will be having cataract surgery in August at [facility].  Patient has chronic pain from arthritis in his back for many years and uses vicodin for pain.  Patient is a current everyday day smoker and has been diagnosed with COPD several years ago. Patient uses nebulizer treatments daily and uses rescue inhalers as needed.  She reports that patient has some shortness of breath routinely but no increased shortness of breath presently. She reports that patient uses his rescue inhaler when he gets short of breath.  Wife is not familiar with action plan.  Discussed with wife COPD action plan and zones.  Will send information on COPD action plan.    Objective:   Encounter Medications:  Outpatient Encounter Prescriptions as of 10/08/2016  Medication Sig  . albuterol (VENTOLIN HFA) 108 (90 BASE) MCG/ACT inhaler Inhale 2 puffs into the lungs every 6 (six) hours as needed for wheezing or shortness of breath.  . donepezil (ARICEPT) 10 MG tablet TAKE ONE TABLET BY MOUTH AT BEDTIME  . DULoxetine (CYMBALTA) 30 MG capsule Take 90 mg by mouth every morning.   . ferrous sulfate 325 (65 FE) MG EC tablet Take 1 tablet (325 mg total) by mouth daily with breakfast.  . fluticasone (FLONASE) 50 MCG/ACT nasal spray instill 2 sprays into each nostril once daily  . fluticasone (FLONASE) 50 MCG/ACT nasal spray instill 2 sprays into each nostril once daily  . HYDROcodone-acetaminophen (NORCO) 10-325 MG tablet Take 1 tablet by mouth every 4 (four)  hours as needed.  . ipratropium-albuterol (DUONEB) 0.5-2.5 (3) MG/3ML SOLN Take 3 mLs by nebulization 2 (two) times daily. And every 4 hours as needed for shortness of breath  . ketoconazole (NIZORAL) 2 % shampoo Apply 1 application topically 2 (two) times a week.  . lamoTRIgine (LAMICTAL) 100 MG tablet Take 50-100 mg by mouth 2 (two) times daily. 50mg in the morning, and 100mg in the evening  . Nebulizers MISC Please dispense nebulizer device per patient's insurance coverage. Sig: use with nebulized medications as directed. Dx: J96.11  . nitroGLYCERIN (NITROSTAT) 0.4 MG SL tablet Place 1 tablet (0.4 mg total) under the tongue every 5 (five) minutes as needed. For chest pain  . omeprazole (PRILOSEC) 20 MG capsule Take 1 capsule (20 mg total) by mouth 2 (two) times daily before a meal.  . OXYGEN Inhale 2.5 L into the lungs at bedtime.  . pravastatin (PRAVACHOL) 80 MG tablet take 1 tablet by mouth once daily (Patient taking differently: take 1 tablet by mouth once at night)  . PROAIR HFA 108 (90 Base) MCG/ACT inhaler inhale 1 to 2 puffs four times a day if needed  . Respiratory Therapy Supplies (NEBULIZER/TUBING/MOUTHPIECE) KIT Please dispense all necessary nebulizer supplies per patient's insurance coverage  . traMADol (ULTRAM) 50 MG tablet TAKE 1 TABLET BY MOUTH TWICE DAILY  . umeclidinium-vilanterol (ANORO ELLIPTA) 62.5-25 MCG/INH AEPB Inhale 1 puff into the lungs daily.  . zolpidem (AMBIEN) 10 MG tablet Take 0.5 tablets (5 mg total) by mouth   at bedtime as needed for sleep.   No facility-administered encounter medications on file as of 10/08/2016.     Functional Status:  In your present state of health, do you have any difficulty performing the following activities: 10/08/2016 12/28/2015  Hearing? [name]  Vision? Y N  Difficulty concentrating or making decisions? [name]  Walking or climbing stairs? Y Y  Dressing or bathing? N N  Doing errands, shopping? [name]  Preparing Food and eating ? Y -  Using  the Toilet? N -  In the past six months, have you accidently leaked urine? N -  Do you have problems with loss of bowel control? N -  Managing your Medications? Y -  Managing your Finances? Y -  Housekeeping or managing your Housekeeping? Y -  Some recent data might be hidden    Fall/Depression Screening: Fall Risk  10/08/2016 12/06/2015  Falls in the past year? No No   PHQ 2/9 Scores 07/23/2016 12/06/2015  PHQ - 2 Score 2 1  PHQ- 9 Score 8 -    Assessment: Caregiver/patient will benefit from COPD education and support for disease management.    Plan:  [facility] CM Care Plan Problem One     Most Recent Value  Role Documenting the Problem One  Health Coach  Care Plan for Problem One  Active  [facility] Long Term Goal   Caregiver/patient will be able to describe COPD exacerbation symptoms and actions to take within the next 90 days.  [facility] Long Term Goal Start Date  10/08/16  Interventions for Problem One Long Term Goal  Health Coach to send COPD information to caregiver that includes action plan. RN Health Coach discussed COPD action plan.    [facility] CM Short Term Goal #1   Caregiver/patient will be able to describe COPD red zone within 30 days.    [facility] CM Short Term Goal #1 Start Date  10/08/16  Interventions for Short Term Goal #1  RN Health Coach discussed with caregiver red zone on action plan.    [facility] CM Short Term Goal #2   Caregiver/patient will be able to action to take to relieve acute shortness of breath within  30 days.   [facility] CM Short Term Goal #2 Start Date  10/08/16  Interventions for Short Term Goal #2  [place] discussed with caregiver actions to relieve increased shorntess of breath episodes.       RN Health Coach will provide ongoing education for caregiver/patient on COPD through phone calls and sending printed information to caregiver/patient for further discussion.  RN Health Coach will send welcome packet with consent to caregiver/patient as well as printed information on heart  failure.  RN Health Coach will send initial barriers letter, assessment, and care plan to primary care physician.  RN Health Coach will contact caregiver/patient in the month of August and caregiver/patient agrees to next outreach.

## 2016-10-08 NOTE — Telephone Encounter (Signed)
This encounter was created in error - please disregard.

## 2016-10-10 ENCOUNTER — Telehealth: Payer: Self-pay | Admitting: Pharmacist

## 2016-10-10 DIAGNOSIS — F411 Generalized anxiety disorder: Secondary | ICD-10-CM | POA: Diagnosis not present

## 2016-10-10 DIAGNOSIS — F0281 Dementia in other diseases classified elsewhere with behavioral disturbance: Secondary | ICD-10-CM | POA: Diagnosis not present

## 2016-10-10 DIAGNOSIS — F332 Major depressive disorder, recurrent severe without psychotic features: Secondary | ICD-10-CM | POA: Diagnosis not present

## 2016-10-11 ENCOUNTER — Encounter: Payer: Self-pay | Admitting: Pharmacist

## 2016-10-11 DIAGNOSIS — M25511 Pain in right shoulder: Secondary | ICD-10-CM | POA: Diagnosis not present

## 2016-10-11 DIAGNOSIS — M79661 Pain in right lower leg: Secondary | ICD-10-CM | POA: Diagnosis not present

## 2016-10-11 DIAGNOSIS — M79662 Pain in left lower leg: Secondary | ICD-10-CM | POA: Diagnosis not present

## 2016-10-11 DIAGNOSIS — M25552 Pain in left hip: Secondary | ICD-10-CM | POA: Diagnosis not present

## 2016-10-11 DIAGNOSIS — M545 Low back pain: Secondary | ICD-10-CM | POA: Diagnosis not present

## 2016-10-11 DIAGNOSIS — M25551 Pain in right hip: Secondary | ICD-10-CM | POA: Diagnosis not present

## 2016-10-11 DIAGNOSIS — M25512 Pain in left shoulder: Secondary | ICD-10-CM | POA: Diagnosis not present

## 2016-10-11 DIAGNOSIS — G894 Chronic pain syndrome: Secondary | ICD-10-CM | POA: Diagnosis not present

## 2016-10-11 NOTE — Patient Outreach (Signed)
[facility]) Care Management  [place]   10/10/2016  COLSEN MODI Sr. [birth year] [id number]  Subjective: Patient was called on 10/10/16 regarding medication assistance. His wife answered the phone and is the patient's caregiver. HIPAA identifiers were obtained.  Patient is a 76 year old male with multiple medical conditions including but not limited to: anemia, anxiety, BPH, osteoarthritis, hypertension, diabetes, DJD, PVD, and spinal stenosis.  Patient's wife shared that although the majority of the patient's medications are generic, they still have issues with medication affordability specifically with Anoro and Proair. Both of which have a copay of $47.    Objective:   Encounter Medications: Outpatient Encounter Prescriptions as of 10/10/2016  Medication Sig  . donepezil (ARICEPT) 10 MG tablet TAKE ONE TABLET BY MOUTH AT BEDTIME  . DULoxetine (CYMBALTA) 60 MG capsule Take 1 capsule by mouth daily.  . ferrous sulfate 325 (65 FE) MG EC tablet Take 1 tablet (325 mg total) by mouth daily with breakfast.  . fluticasone (FLONASE) 50 MCG/ACT nasal spray instill 2 sprays into each nostril once daily  . HYDROcodone-acetaminophen (NORCO) 10-325 MG tablet Take 1 tablet by mouth every 4 (four) hours as needed.  Marland Kitchen ipratropium-albuterol (DUONEB) 0.5-2.5 (3) MG/3ML SOLN Take 3 mLs by nebulization 2 (two) times daily. And every 4 hours as needed for shortness of breath  . ketoconazole (NIZORAL) 2 % shampoo Apply 1 application topically 2 (two) times a week.  . lamoTRIgine (LAMICTAL) 100 MG tablet Take 50-100 mg by mouth 2 (two) times daily. 66m in the morning, and 1070min the evening  . Nebulizers MISC Please dispense nebulizer device per patient's insurance coverage. Sig: use with nebulized medications as directed. Dx: J96.11  . nitroGLYCERIN (NITROSTAT) 0.4 MG SL tablet Place 1 tablet (0.4 mg total) under the tongue every 5 (five) minutes as needed. For chest pain  .  omeprazole (PRILOSEC) 20 MG capsule Take 1 capsule (20 mg total) by mouth 2 (two) times daily before a meal.  . OXYGEN Inhale 2.5 L into the lungs at bedtime.  . pravastatin (PRAVACHOL) 80 MG tablet take 1 tablet by mouth once daily (Patient taking differently: take 1 tablet by mouth once at night)  . PROAIR HFA 108 (90 Base) MCG/ACT inhaler inhale 1 to 2 puffs four times a day if needed  . Respiratory Therapy Supplies (NEBULIZER/TUBING/MOUTHPIECE) KIT Please dispense all necessary nebulizer supplies per patient's insurance coverage  . traMADol (ULTRAM) 50 MG tablet TAKE 1 TABLET BY MOUTH TWICE DAILY  . umeclidinium-vilanterol (ANORO ELLIPTA) 62.5-25 MCG/INH AEPB Inhale 1 puff into the lungs daily.  . Marland Kitchenolpidem (AMBIEN) 10 MG tablet Take 0.5 tablets (5 mg total) by mouth at bedtime as needed for sleep.  . [DISCONTINUED] albuterol (VENTOLIN HFA) 108 (90 BASE) MCG/ACT inhaler Inhale 2 puffs into the lungs every 6 (six) hours as needed for wheezing or shortness of breath. (Patient not taking: Reported on 10/10/2016)  . [DISCONTINUED] DULoxetine (CYMBALTA) 30 MG capsule Take 90 mg by mouth every morning.   . [DISCONTINUED] fluticasone (FLONASE) 50 MCG/ACT nasal spray instill 2 sprays into each nostril once daily   No facility-administered encounter medications on file as of 10/10/2016.     Functional Status: In your present state of health, do you have any difficulty performing the following activities: 10/08/2016 12/28/2015  Hearing? Y Tempie DonningVision? Y N  Difficulty concentrating or making decisions? Y Tempie DonningWalking or climbing stairs? Y Y  Dressing or bathing? N N  Doing errands,  shopping? [name]  Preparing Food and eating ? Y -  Using the Toilet? N -  In the past six months, have you accidently leaked urine? N -  Do you have problems with loss of bowel control? N -  Managing your Medications? Y -  Managing your Finances? Y -  Housekeeping or managing your Housekeeping? Y -  Some recent data might be  hidden    Fall/Depression Screening: Fall Risk  10/08/2016 12/06/2015  Falls in the past year? No No   PHQ 2/9 Scores 07/23/2016 12/06/2015  PHQ - 2 Score 2 1  PHQ- 9 Score 8 -      Assessment: Patient's medications were reviewed over the phone.  Medication Assistance Findings:  Patient has [employer] Medicare Part D He is over income for the "Extra Help" Program from Social Security According to the EOB from May 31. 2018--patient has spent $620.31 on prescription drug expenses  He may qualify to receive Ventolin and Anoro from [place] Patient Assistance Program  Patient's wife understands she will need to provide the [place] Verification for she and the patient. Patient's wife also understands she will need to provide a copy of the EOB from [employer]   Plan: Patient will be followed up by [place], Doreene Burke.

## 2016-10-22 ENCOUNTER — Other Ambulatory Visit: Payer: Self-pay | Admitting: Sports Medicine

## 2016-11-01 ENCOUNTER — Other Ambulatory Visit: Payer: Self-pay | Admitting: Osteopathic Medicine

## 2016-11-05 ENCOUNTER — Telehealth: Payer: Self-pay | Admitting: Osteopathic Medicine

## 2016-11-05 ENCOUNTER — Other Ambulatory Visit: Payer: Self-pay

## 2016-11-05 DIAGNOSIS — H532 Diplopia: Secondary | ICD-10-CM | POA: Diagnosis not present

## 2016-11-05 DIAGNOSIS — H43812 Vitreous degeneration, left eye: Secondary | ICD-10-CM | POA: Diagnosis not present

## 2016-11-05 DIAGNOSIS — J42 Unspecified chronic bronchitis: Secondary | ICD-10-CM

## 2016-11-05 DIAGNOSIS — H50012 Monocular esotropia, left eye: Secondary | ICD-10-CM | POA: Diagnosis not present

## 2016-11-05 DIAGNOSIS — H2513 Age-related nuclear cataract, bilateral: Secondary | ICD-10-CM | POA: Diagnosis not present

## 2016-11-05 DIAGNOSIS — E119 Type 2 diabetes mellitus without complications: Secondary | ICD-10-CM | POA: Diagnosis not present

## 2016-11-05 DIAGNOSIS — H5000 Unspecified esotropia: Secondary | ICD-10-CM | POA: Diagnosis not present

## 2016-11-05 MED ORDER — UMECLIDINIUM-VILANTEROL 62.5-25 MCG/INH IN AEPB: 1.0000 | INHALATION_SPRAY | Freq: Every day | RESPIRATORY_TRACT | 3 refills | Status: DC

## 2016-11-05 MED ORDER — ALBUTEROL SULFATE HFA 108 (90 BASE) MCG/ACT IN AERS: 1.0000 | INHALATION_SPRAY | RESPIRATORY_TRACT | 11 refills | Status: DC | PRN

## 2016-11-05 MED ORDER — PRAVASTATIN SODIUM 80 MG PO TABS: 80.0000 mg | ORAL_TABLET | Freq: Every day | ORAL | 0 refills | Status: DC

## 2016-11-05 NOTE — Telephone Encounter (Signed)
-----   Message from Crista Luria, CPhT sent at 11/05/2016  1:46 PM EDT ----- Regarding: Patient assistance prescription's Hello Dr. Sheppard Coil,  I am working on a patient assistance application for [name] DOB 29-Jun-1940 for Ventolin and Anoro. I have the patient's completed portion I just need hardcopy prescription's to mail to [place]. I have left two messages for Rhonda and I haven't received a response yet. Please contact me if you have any questions.  Thank you,  Doreene Burke, [place] [phone] office [phone] fax

## 2016-11-05 NOTE — Telephone Encounter (Signed)
Printed and signed these prescriptions, should be faxed to you by tomorrow, please let me know if you don't get them!

## 2016-11-06 ENCOUNTER — Other Ambulatory Visit: Payer: Self-pay | Admitting: Osteopathic Medicine

## 2016-11-07 ENCOUNTER — Other Ambulatory Visit: Payer: Self-pay

## 2016-11-07 NOTE — Patient Outreach (Signed)
[facility]) Care Management  11/07/2016  Tyler MINTON Sr. [birth year] [id number]   Telephone call to patient for monthly call.  No answer.  HIPAA compliant voice message left.  Plan: RN Health Coach will attempt patient again in the month of August.

## 2016-11-08 ENCOUNTER — Telehealth: Payer: Self-pay | Admitting: Osteopathic Medicine

## 2016-11-08 MED ORDER — ALBUTEROL SULFATE HFA 108 (90 BASE) MCG/ACT IN AERS: 1.0000 | INHALATION_SPRAY | RESPIRATORY_TRACT | 12 refills | Status: DC | PRN

## 2016-11-08 NOTE — Telephone Encounter (Signed)
We'll fax prescription over Note to my office staff: I have printed this and we'll placed in fax box. See below for fax number

## 2016-11-08 NOTE — Telephone Encounter (Signed)
Rx faxed -EH/RMA

## 2016-11-08 NOTE — Telephone Encounter (Signed)
-----   Message from Crista Luria, CPhT sent at 11/07/2016  9:27 AM EDT ----- Regarding: FW: Patient assistance prescription's  Hello Dr. Sheppard Coil,  Thank you for sending the requested prescription's. Since the patient is applying for [place] patient assistance I need a prescription for Ventolin instead of Proair because they will not substitute. Please send me an updated prescription at your earliest convenience.  Thanks,  Crystal  ----- Message ----- From: Crista Luria, CPhT Sent: 11/05/2016   1:46 PM To: [name], DO Subject: Patient assistance prescription's              Hello Dr. Sheppard Coil,  I am working on a patient assistance application for Wilman Tucker DOB 1940-08-24 for Ventolin and Anoro. I have the patient's completed portion I just need hardcopy prescription's to mail to [place]. I have left two messages for Rhonda and I haven't received a response yet. Please contact me if you have any questions.  Thank you,  Doreene Burke, [place] [phone] office [phone] fax

## 2016-11-11 ENCOUNTER — Other Ambulatory Visit: Payer: Self-pay | Admitting: Pharmacy Technician

## 2016-11-11 NOTE — Patient Outreach (Signed)
[place] [facility]) Care Management  11/11/2016  DONOVYN GUIDICE Sr. 1940-07-09 [id number]  Patient assistance application and prescription's  faxed to [place] for Ventolin and Anoro. Patient's insurance card is not in [place], patient's wife will have info faxed in tomorrow and I will fax the information to [place] once received. [employer] can start the approval process without the insurance card.  Doreene Burke, [place] [phone]

## 2016-11-12 DIAGNOSIS — G894 Chronic pain syndrome: Secondary | ICD-10-CM | POA: Diagnosis not present

## 2016-11-12 DIAGNOSIS — M25561 Pain in right knee: Secondary | ICD-10-CM | POA: Diagnosis not present

## 2016-11-12 DIAGNOSIS — M79671 Pain in right foot: Secondary | ICD-10-CM | POA: Diagnosis not present

## 2016-11-12 DIAGNOSIS — M25552 Pain in left hip: Secondary | ICD-10-CM | POA: Diagnosis not present

## 2016-11-12 DIAGNOSIS — M79672 Pain in left foot: Secondary | ICD-10-CM | POA: Diagnosis not present

## 2016-11-13 DIAGNOSIS — M79671 Pain in right foot: Secondary | ICD-10-CM | POA: Diagnosis not present

## 2016-11-13 DIAGNOSIS — M25561 Pain in right knee: Secondary | ICD-10-CM | POA: Diagnosis not present

## 2016-11-13 DIAGNOSIS — G894 Chronic pain syndrome: Secondary | ICD-10-CM | POA: Diagnosis not present

## 2016-11-13 DIAGNOSIS — M79672 Pain in left foot: Secondary | ICD-10-CM | POA: Diagnosis not present

## 2016-11-13 DIAGNOSIS — M25552 Pain in left hip: Secondary | ICD-10-CM | POA: Diagnosis not present

## 2016-11-14 ENCOUNTER — Ambulatory Visit: Payer: Medicare Other | Admitting: Cardiovascular Disease

## 2016-11-21 ENCOUNTER — Other Ambulatory Visit: Payer: Self-pay | Admitting: Pharmacy Technician

## 2016-11-21 DIAGNOSIS — M1990 Unspecified osteoarthritis, unspecified site: Secondary | ICD-10-CM | POA: Diagnosis not present

## 2016-11-21 DIAGNOSIS — H25812 Combined forms of age-related cataract, left eye: Secondary | ICD-10-CM | POA: Diagnosis not present

## 2016-11-21 DIAGNOSIS — K219 Gastro-esophageal reflux disease without esophagitis: Secondary | ICD-10-CM | POA: Diagnosis not present

## 2016-11-21 DIAGNOSIS — J449 Chronic obstructive pulmonary disease, unspecified: Secondary | ICD-10-CM | POA: Diagnosis not present

## 2016-11-21 DIAGNOSIS — Z951 Presence of aortocoronary bypass graft: Secondary | ICD-10-CM | POA: Diagnosis not present

## 2016-11-21 DIAGNOSIS — J309 Allergic rhinitis, unspecified: Secondary | ICD-10-CM | POA: Diagnosis not present

## 2016-11-21 DIAGNOSIS — I129 Hypertensive chronic kidney disease with stage 1 through stage 4 chronic kidney disease, or unspecified chronic kidney disease: Secondary | ICD-10-CM | POA: Diagnosis not present

## 2016-11-21 DIAGNOSIS — E1122 Type 2 diabetes mellitus with diabetic chronic kidney disease: Secondary | ICD-10-CM | POA: Diagnosis not present

## 2016-11-21 DIAGNOSIS — E785 Hyperlipidemia, unspecified: Secondary | ICD-10-CM | POA: Diagnosis not present

## 2016-11-21 DIAGNOSIS — Z955 Presence of coronary angioplasty implant and graft: Secondary | ICD-10-CM | POA: Diagnosis not present

## 2016-11-21 DIAGNOSIS — D649 Anemia, unspecified: Secondary | ICD-10-CM | POA: Diagnosis not present

## 2016-11-21 DIAGNOSIS — N183 Chronic kidney disease, stage 3 (moderate): Secondary | ICD-10-CM | POA: Diagnosis not present

## 2016-11-21 DIAGNOSIS — H2512 Age-related nuclear cataract, left eye: Secondary | ICD-10-CM | POA: Diagnosis not present

## 2016-11-21 DIAGNOSIS — I2510 Atherosclerotic heart disease of native coronary artery without angina pectoris: Secondary | ICD-10-CM | POA: Diagnosis not present

## 2016-11-21 NOTE — Patient Outreach (Signed)
[place] [facility]) Care Management  11/21/2016  Tyler HEAL Sr. Mar 20, 1941 [id number]  I attempted to contact patient in reference to patient assistance application for Ventolin and Anoro that was faxed to [place] on 11-12-2016. I left a voicemail on [employer] phone since she is the patient's advocate I will need her on the call to [place]. I requested for her to call me back so that we can do a conference call to check on the status of the application.  Doreene Burke, [place] [phone]

## 2016-11-22 DIAGNOSIS — Z48810 Encounter for surgical aftercare following surgery on the sense organs: Secondary | ICD-10-CM | POA: Diagnosis not present

## 2016-11-22 DIAGNOSIS — Z961 Presence of intraocular lens: Secondary | ICD-10-CM | POA: Diagnosis not present

## 2016-11-27 ENCOUNTER — Encounter: Payer: Self-pay | Admitting: Osteopathic Medicine

## 2016-11-27 ENCOUNTER — Other Ambulatory Visit: Payer: Self-pay

## 2016-11-27 DIAGNOSIS — M5430 Sciatica, unspecified side: Secondary | ICD-10-CM

## 2016-11-27 DIAGNOSIS — M5136 Other intervertebral disc degeneration, lumbar region: Secondary | ICD-10-CM

## 2016-11-27 DIAGNOSIS — M19211 Secondary osteoarthritis, right shoulder: Secondary | ICD-10-CM

## 2016-11-27 DIAGNOSIS — M19212 Secondary osteoarthritis, left shoulder: Secondary | ICD-10-CM

## 2016-11-27 DIAGNOSIS — M1990 Unspecified osteoarthritis, unspecified site: Secondary | ICD-10-CM

## 2016-11-27 NOTE — Patient Outreach (Signed)
[place] [facility]) Care Management  [place]  11/27/2016   Tyler LETIZIA Sr. 05/10/1940 [id number]  Subjective: Telephone call to wife for monthly call. Patient home phone number has changed. Note made in demographics.  She reports that patient is doing ok.  She reports that patient has not had any problems with breathing.  Discussed with wife COPD action plan and when to notify physician.  She verbalized understanding.    Objective:   Encounter Medications:  Outpatient Encounter Prescriptions as of 11/27/2016  Medication Sig  . albuterol (PROVENTIL HFA;VENTOLIN HFA) 108 (90 Base) MCG/ACT inhaler Inhale 1-2 puffs into the lungs every 4 (four) hours as needed for wheezing or shortness of breath.  . donepezil (ARICEPT) 10 MG tablet TAKE 1 TABLET BY MOUTH AT BEDTIME  . DULoxetine (CYMBALTA) 60 MG capsule Take 1 capsule by mouth daily.  . ferrous sulfate 325 (65 FE) MG EC tablet Take 1 tablet (325 mg total) by mouth daily with breakfast.  . fluticasone (FLONASE) 50 MCG/ACT nasal spray instill 2 sprays into each nostril once daily  . HYDROcodone-acetaminophen (NORCO) 10-325 MG tablet Take 1 tablet by mouth every 4 (four) hours as needed.  Marland Kitchen ipratropium-albuterol (DUONEB) 0.5-2.5 (3) MG/3ML SOLN Take 3 mLs by nebulization 2 (two) times daily. And every 4 hours as needed for shortness of breath  . ketoconazole (NIZORAL) 2 % shampoo Apply 1 application topically 2 (two) times a week.  . lamoTRIgine (LAMICTAL) 100 MG tablet Take 50-100 mg by mouth 2 (two) times daily. 86m in the morning, and 1072min the evening  . Nebulizers MISC Please dispense nebulizer device per patient's insurance coverage. Sig: use with nebulized medications as directed. Dx: J96.11  . nitroGLYCERIN (NITROSTAT) 0.4 MG SL tablet Place 1 tablet (0.4 mg total) under the tongue every 5 (five) minutes as needed. For chest pain  . omeprazole (PRILOSEC) 20 MG capsule Take 1 capsule (20 mg total) by mouth 2  (two) times daily before a meal.  . OXYGEN Inhale 2.5 L into the lungs at bedtime.  . pravastatin (PRAVACHOL) 80 MG tablet Take 1 tablet (80 mg total) by mouth daily.  . Marland Kitchenespiratory Therapy Supplies (NEBULIZER/TUBING/MOUTHPIECE) KIT Please dispense all necessary nebulizer supplies per patient's insurance coverage  . traMADol (ULTRAM) 50 MG tablet TAKE 1 TABLET BY MOUTH TWICE DAILY  . umeclidinium-vilanterol (ANORO ELLIPTA) 62.5-25 MCG/INH AEPB Inhale 1 puff into the lungs daily.  . Marland Kitchenolpidem (AMBIEN) 10 MG tablet Take 0.5 tablets (5 mg total) by mouth at bedtime as needed for sleep.   No facility-administered encounter medications on file as of 11/27/2016.     Functional Status:  In your present state of health, do you have any difficulty performing the following activities: 10/08/2016 12/28/2015  Hearing? Y Tempie DonningVision? Y N  Difficulty concentrating or making decisions? Y Y  Comment short term memory loss -  Walking or climbing stairs? Y Y  Comment difficult for patient -  Dressing or bathing? N N  Doing errands, shopping? Y Tempie DonningPreparing Food and eating ? Y -  Using the Toilet? N -  In the past six months, have you accidently leaked urine? N -  Do you have problems with loss of bowel control? N -  Managing your Medications? Y -  Managing your Finances? Y -  Housekeeping or managing your Housekeeping? Y -  Some recent data might be hidden    Fall/Depression Screening: Fall Risk  11/27/2016 10/08/2016 12/06/2015  Falls in the  past year? No No No   PHQ 2/9 Scores 07/23/2016 12/06/2015  PHQ - 2 Score 2 1  PHQ- 9 Score 8 -    Assessment: Patient/caregiver continues to benefit from health coach outreach for disease management and support.    Plan: [facility] CM Care Plan Problem One     Most Recent Value  Care Plan Problem One  Knowledge Deficit COPD   Role Documenting the Problem One  Health Coach  Care Plan for Problem One  Active  THN Long Term Goal   Caregiver/patient will be able to  describe COPD exacerbation symptoms and actions to take within the next 90 days.  THN Long Term Goal Start Date  11/27/16 Barrie Folk continued]  Interventions for Problem One Long Term Goal  RN Health Coach discussed action plan with wife.   THN CM Short Term Goal #1   Caregiver/patient will be able to describe COPD red zone within 30 days.    THN CM Short Term Goal #1 Start Date  11/27/16 [goal continued]  Interventions for Short Term Goal #1  RN Health Coach reviewed with caregiver red zone on action plan.    THN CM Short Term Goal #2   Caregiver/patient will be able to action to take to relieve acute shortness of breath within  30 days.   THN CM Short Term Goal #2 Start Date  10/08/16  [facility] CM Short Term Goal #2 Met Date  11/27/16  Interventions for Short Term Goal #2  [place] discussed with caregiver actions to relieve increased shorntess of breath episodes.       RN Health Coach will contact patient/caregiver in the month of September and patient/caregiver agrees to next outreach.   Jone Baseman, RN, MSN [place] [phone]

## 2016-11-28 DIAGNOSIS — Z961 Presence of intraocular lens: Secondary | ICD-10-CM | POA: Diagnosis not present

## 2016-11-28 DIAGNOSIS — Z48810 Encounter for surgical aftercare following surgery on the sense organs: Secondary | ICD-10-CM | POA: Diagnosis not present

## 2016-11-29 ENCOUNTER — Telehealth: Payer: Self-pay | Admitting: Osteopathic Medicine

## 2016-11-29 NOTE — Telephone Encounter (Signed)
I have resubmitted the referral to [facility] and called the patient and spoke with his wife to let them know iit has been done and they should be hearing from them. Thanks

## 2016-12-04 ENCOUNTER — Telehealth: Payer: Self-pay

## 2016-12-04 DIAGNOSIS — M19019 Primary osteoarthritis, unspecified shoulder: Secondary | ICD-10-CM | POA: Diagnosis not present

## 2016-12-04 DIAGNOSIS — Z79891 Long term (current) use of opiate analgesic: Secondary | ICD-10-CM | POA: Diagnosis not present

## 2016-12-04 DIAGNOSIS — M5136 Other intervertebral disc degeneration, lumbar region: Secondary | ICD-10-CM | POA: Diagnosis not present

## 2016-12-04 DIAGNOSIS — Z79899 Other long term (current) drug therapy: Secondary | ICD-10-CM | POA: Diagnosis not present

## 2016-12-04 DIAGNOSIS — M47816 Spondylosis without myelopathy or radiculopathy, lumbar region: Secondary | ICD-10-CM | POA: Diagnosis not present

## 2016-12-04 DIAGNOSIS — G894 Chronic pain syndrome: Secondary | ICD-10-CM | POA: Diagnosis not present

## 2016-12-04 MED ORDER — OMEPRAZOLE 20 MG PO CPDR: 20.0000 mg | DELAYED_RELEASE_CAPSULE | Freq: Two times a day (BID) | ORAL | 11 refills | Status: DC

## 2016-12-04 NOTE — Telephone Encounter (Signed)
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 Nafeesa Dils,CMA

## 2016-12-06 ENCOUNTER — Telehealth: Payer: Self-pay | Admitting: Pharmacist

## 2016-12-06 ENCOUNTER — Other Ambulatory Visit: Payer: Self-pay | Admitting: Pharmacy Technician

## 2016-12-06 NOTE — Patient Outreach (Signed)
[place] [facility]) Care Management  12/06/2016  [name] Sr. 04-14-1940 [id number]   Patient's pharmacy case will be closed because the patient was referred for medication adherence and he was approved and has received Ventolin and Anoro from [employer].   Elayne Guerin, PharmD, [place] Clinical Pharmacist [phone]

## 2016-12-06 NOTE — Patient Outreach (Signed)
[facility]) Care Management  12/06/2016  IVIS HENNEMAN Sr. 09-18-1940 [id number]  [employer] patient assistance application for Ventolin and Anoro approved through 04/07/2017.  Doreene Burke, [place] [phone]

## 2016-12-10 DIAGNOSIS — M47816 Spondylosis without myelopathy or radiculopathy, lumbar region: Secondary | ICD-10-CM | POA: Diagnosis not present

## 2016-12-12 DIAGNOSIS — F330 Major depressive disorder, recurrent, mild: Secondary | ICD-10-CM | POA: Diagnosis not present

## 2016-12-12 DIAGNOSIS — F411 Generalized anxiety disorder: Secondary | ICD-10-CM | POA: Diagnosis not present

## 2016-12-12 DIAGNOSIS — F0281 Dementia in other diseases classified elsewhere with behavioral disturbance: Secondary | ICD-10-CM | POA: Diagnosis not present

## 2016-12-18 ENCOUNTER — Other Ambulatory Visit: Payer: Self-pay

## 2016-12-18 NOTE — Patient Outreach (Signed)
[place] [facility]) Care Management  [place]  12/18/2016   Tyler KNEECE Sr. May 24, 1940 [id number]  Subjective: Telephone call to wife/caregiver for monthly call.  She reports patient is doing ok but does give out of breath while doing things, which is normal for him.  She reports that medication assistance has been approved.  Advised wife that if she has problems with obtaining patient medications in the future to please let me know.  She verbalized understanding.  Patient to see primary care doctor later this month per wife.  Discussed with wife importance of flu vaccine and avoiding those that are sick.  She verbalized understanding. Discussed with wife COPD and when to seek assistance. She verbalized understanding.   Objective:   Encounter Medications:  Outpatient Encounter Prescriptions as of 12/18/2016  Medication Sig  . albuterol (PROVENTIL HFA;VENTOLIN HFA) 108 (90 Base) MCG/ACT inhaler Inhale 1-2 puffs into the lungs every 4 (four) hours as needed for wheezing or shortness of breath.  . donepezil (ARICEPT) 10 MG tablet TAKE 1 TABLET BY MOUTH AT BEDTIME  . DULoxetine (CYMBALTA) 60 MG capsule Take 1 capsule by mouth daily.  . ferrous sulfate 325 (65 FE) MG EC tablet Take 1 tablet (325 mg total) by mouth daily with breakfast.  . fluticasone (FLONASE) 50 MCG/ACT nasal spray instill 2 sprays into each nostril once daily  . HYDROcodone-acetaminophen (NORCO) 10-325 MG tablet Take 1 tablet by mouth every 4 (four) hours as needed.  Marland Kitchen ipratropium-albuterol (DUONEB) 0.5-2.5 (3) MG/3ML SOLN Take 3 mLs by nebulization 2 (two) times daily. And every 4 hours as needed for shortness of breath  . ketoconazole (NIZORAL) 2 % shampoo Apply 1 application topically 2 (two) times a week.  . lamoTRIgine (LAMICTAL) 100 MG tablet Take 50-100 mg by mouth 2 (two) times daily. 1m in the morning, and 1022min the evening  . Nebulizers MISC Please dispense nebulizer device per patient's  insurance coverage. Sig: use with nebulized medications as directed. Dx: J96.11  . nitroGLYCERIN (NITROSTAT) 0.4 MG SL tablet Place 1 tablet (0.4 mg total) under the tongue every 5 (five) minutes as needed. For chest pain  . omeprazole (PRILOSEC) 20 MG capsule Take 1 capsule (20 mg total) by mouth 2 (two) times daily before a meal.  . OXYGEN Inhale 2.5 L into the lungs at bedtime.  . pravastatin (PRAVACHOL) 80 MG tablet Take 1 tablet (80 mg total) by mouth daily.  . Marland Kitchenespiratory Therapy Supplies (NEBULIZER/TUBING/MOUTHPIECE) KIT Please dispense all necessary nebulizer supplies per patient's insurance coverage  . traMADol (ULTRAM) 50 MG tablet TAKE 1 TABLET BY MOUTH TWICE DAILY  . umeclidinium-vilanterol (ANORO ELLIPTA) 62.5-25 MCG/INH AEPB Inhale 1 puff into the lungs daily.  . Marland Kitchenolpidem (AMBIEN) 10 MG tablet Take 0.5 tablets (5 mg total) by mouth at bedtime as needed for sleep.   No facility-administered encounter medications on file as of 12/18/2016.     Functional Status:  In your present state of health, do you have any difficulty performing the following activities: 10/08/2016 12/28/2015  Hearing? Y Tempie DonningVision? Y N  Difficulty concentrating or making decisions? Y Y  Comment short term memory loss -  Walking or climbing stairs? Y Y  Comment difficult for patient -  Dressing or bathing? N N  Doing errands, shopping? Y Tempie DonningPreparing Food and eating ? Y -  Using the Toilet? N -  In the past six months, have you accidently leaked urine? N -  Do you have problems  with loss of bowel control? N -  Managing your Medications? Y -  Managing your Finances? Y -  Housekeeping or managing your Housekeeping? Y -  Some recent data might be hidden    Fall/Depression Screening: Fall Risk  11/27/2016 10/08/2016 12/06/2015  Falls in the past year? No No No   PHQ 2/9 Scores 07/23/2016 12/06/2015  PHQ - 2 Score 2 1  PHQ- 9 Score 8 -    Assessment: Caregiver/Patient continues to benefit from health coach  outreach for disease management and support.    Plan:  [facility] CM Care Plan Problem One     Most Recent Value  Care Plan Problem One  Knowledge Deficit COPD   Role Documenting the Problem One  Health Coach  Care Plan for Problem One  Active  THN Long Term Goal   Caregiver/patient will be able to describe COPD exacerbation symptoms and actions to take within the next 90 days.  THN Long Term Goal Start Date  12/18/16 Barrie Folk continued]  Interventions for Problem One Long Term Goal  RN Health Coach discussed action plan with wife.   THN CM Short Term Goal #1   Caregiver/patient will be able to describe COPD red zone within 30 days.    THN CM Short Term Goal #1 Start Date  12/18/16 Barrie Folk continued]  Interventions for Short Term Goal #1  RN Health Coach discussed with caregiver red zone on action plan.       RN Health Coach will contact patient in the month of October and patient agrees to next outreach.  Jone Baseman, RN, MSN [place] [phone]

## 2016-12-26 DIAGNOSIS — E119 Type 2 diabetes mellitus without complications: Secondary | ICD-10-CM | POA: Diagnosis not present

## 2016-12-26 DIAGNOSIS — Z961 Presence of intraocular lens: Secondary | ICD-10-CM | POA: Diagnosis not present

## 2016-12-26 DIAGNOSIS — H5000 Unspecified esotropia: Secondary | ICD-10-CM | POA: Diagnosis not present

## 2016-12-26 DIAGNOSIS — H50012 Monocular esotropia, left eye: Secondary | ICD-10-CM | POA: Diagnosis not present

## 2016-12-26 DIAGNOSIS — H25811 Combined forms of age-related cataract, right eye: Secondary | ICD-10-CM | POA: Diagnosis not present

## 2016-12-26 DIAGNOSIS — H43812 Vitreous degeneration, left eye: Secondary | ICD-10-CM | POA: Diagnosis not present

## 2016-12-26 DIAGNOSIS — H532 Diplopia: Secondary | ICD-10-CM | POA: Diagnosis not present

## 2016-12-26 DIAGNOSIS — H2513 Age-related nuclear cataract, bilateral: Secondary | ICD-10-CM | POA: Diagnosis not present

## 2016-12-30 ENCOUNTER — Encounter: Payer: Self-pay | Admitting: Osteopathic Medicine

## 2016-12-30 ENCOUNTER — Ambulatory Visit (INDEPENDENT_AMBULATORY_CARE_PROVIDER_SITE_OTHER): Payer: Medicare Other | Admitting: Osteopathic Medicine

## 2016-12-30 VITALS — BP 129/79 | HR 57 | Wt 145.0 lb

## 2016-12-30 DIAGNOSIS — E875 Hyperkalemia: Secondary | ICD-10-CM

## 2016-12-30 DIAGNOSIS — I2510 Atherosclerotic heart disease of native coronary artery without angina pectoris: Secondary | ICD-10-CM | POA: Diagnosis not present

## 2016-12-30 DIAGNOSIS — J42 Unspecified chronic bronchitis: Secondary | ICD-10-CM | POA: Diagnosis not present

## 2016-12-30 DIAGNOSIS — D649 Anemia, unspecified: Secondary | ICD-10-CM

## 2016-12-30 DIAGNOSIS — Z23 Encounter for immunization: Secondary | ICD-10-CM

## 2016-12-30 DIAGNOSIS — N183 Chronic kidney disease, stage 3 unspecified: Secondary | ICD-10-CM

## 2016-12-30 NOTE — Progress Notes (Signed)
HPI: [name]. is a 76 y.o. male  who presents to [facility] today, 12/30/16,  for chief complaint of:  Chief Complaint  Patient presents with  . Follow-up    COPD    Weight seems to have stabilized. Patient states that he is eating well, breathing well. No concerns.   COPD: Stable on current medications, patient reports chronic shortness of breath with dyspnea but not interested in oxygen at this point, compliant with inhalers. He reports some worsening shortness of breath lately  Patient is accompanied by wife who assists with history-taking.    Reviewed records from most recent visit with pulmonology, greater than 1 year ago. Patient was asked to follow-up for PFTs and discussion of CT results but doesn't look like he's done this.     Past medical history, surgical history, social history and family history reviewed.  Patient Active Problem List   Diagnosis Date Noted  . Acute encephalopathy 12/28/2015  . Diabetes mellitus with complication ([place]) 41/74/0814  . Hx of CABG 12/28/2015  . Dementia 12/28/2015  . H/O asbestos exposure 10/31/2015  . Abdominal pain, generalized   . Abnormal CT scan   . Loss of weight   . Chronic kidney disease 08/30/2015  . Lung nodule < 6cm on CT 08/29/2015  . Moderate protein-calorie malnutrition ([place]) 08/24/2015  . COPD with acute exacerbation ([place]) 08/24/2015  . Bilateral glenohumeral osteoarthritis 06/12/2015  . Rhinitis, allergic 06/12/2015  . COPD, severe ([place]) 05/10/2015  . Bradycardia 04/11/2015  . Insomnia 04/05/2015  . Risk for falls 04/05/2015  . Type 2 diabetes mellitus with circulatory disorder ([place]) 04/05/2015  . Onychomycosis 11/25/2014  . Tinea pedis 11/25/2014  . Acute low back pain 08/18/2013  . Chest pain, atypical 10/17/2012  . Chronic respiratory failure with hypoxia ([place]) 10/17/2012  . On home oxygen therapy 10/17/2012  . Hyperkalemia 10/17/2012  . Headache(784.0)  02/20/2012  . Weight loss 04/17/2011  . Bladder neck obstruction 01/17/2011  . Preventative health care 11/17/2010  . CONSTIPATION 06/12/2010  . FLATULENCE-GAS-BLOATING 06/12/2010  . SCIATICA, LEFT 05/31/2010  . PHIMOSIS 04/06/2009  . TOBACCO USER 01/17/2009  . DYSPHAGIA 07/25/2008  . Osteoarthritis 06/24/2008  . ANEMIA-IRON DEFICIENCY 05/20/2008  . Memory loss 05/18/2008  . DEGENERATIVE DISC DISEASE, LUMBAR SPINE 01/03/2008  . Anxiety state 06/24/2007  . DEPRESSION 06/24/2007  . BENIGN PROSTATIC HYPERTROPHY 06/24/2007  . HYPERSOMNIA 06/24/2007  . FATIGUE 06/24/2007  . Urinary hesitancy 06/24/2007  . COLONIC POLYPS 06/12/2007  . Diabetes ([place]) 06/12/2007  . HYPERLIPIDEMIA 06/12/2007  . Essential hypertension 06/12/2007  . ANGINA PECTORIS 06/12/2007  . Coronary atherosclerosis 06/12/2007  . PERIPHERAL VASCULAR DISEASE 06/12/2007  . ESOPHAGEAL STRICTURE 06/12/2007  . GERD 06/12/2007  . DIVERTICULOSIS, COLON 06/12/2007  . Degenerative disc disease, lumbar 06/12/2007  . SPINAL STENOSIS 06/12/2007  . Lumbago 06/12/2007    Current medication list and allergy/intolerance information reviewed.   Current Outpatient Prescriptions on File Prior to Visit  Medication Sig Dispense Refill  . albuterol (PROVENTIL HFA;VENTOLIN HFA) 108 (90 Base) MCG/ACT inhaler Inhale 1-2 puffs into the lungs every 4 (four) hours as needed for wheezing or shortness of breath. 2 Inhaler 12  . donepezil (ARICEPT) 10 MG tablet TAKE 1 TABLET BY MOUTH AT BEDTIME 90 tablet 0  . DULoxetine (CYMBALTA) 60 MG capsule Take 1 capsule by mouth daily.    . ferrous sulfate 325 (65 FE) MG EC tablet Take 1 tablet (325 mg total) by mouth daily with breakfast. 90 tablet  1  . fluticasone (FLONASE) 50 MCG/ACT nasal spray instill 2 sprays into each nostril once daily 16 g 11  . HYDROcodone-acetaminophen (NORCO) 10-325 MG tablet Take 1 tablet by mouth every 4 (four) hours as needed.    Marland Kitchen ipratropium-albuterol (DUONEB) 0.5-2.5  (3) MG/3ML SOLN Take 3 mLs by nebulization 2 (two) times daily. And every 4 hours as needed for shortness of breath 180 mL 3  . ketoconazole (NIZORAL) 2 % shampoo Apply 1 application topically 2 (two) times a week. 120 mL 2  . lamoTRIgine (LAMICTAL) 100 MG tablet Take 50-100 mg by mouth 2 (two) times daily. 64m in the morning, and 1049min the evening    . Nebulizers MISC Please dispense nebulizer device per patient's insurance coverage. Sig: use with nebulized medications as directed. Dx: J96.11 1 each 1  . nitroGLYCERIN (NITROSTAT) 0.4 MG SL tablet Place 1 tablet (0.4 mg total) under the tongue every 5 (five) minutes as needed. For chest pain 25 tablet 3  . omeprazole (PRILOSEC) 20 MG capsule Take 1 capsule (20 mg total) by mouth 2 (two) times daily before a meal. 60 capsule 11  . OXYGEN Inhale 2.5 L into the lungs at bedtime.    . pravastatin (PRAVACHOL) 80 MG tablet Take 1 tablet (80 mg total) by mouth daily. 90 tablet 0  . Respiratory Therapy Supplies (NEBULIZER/TUBING/MOUTHPIECE) KIT Please dispense all necessary nebulizer supplies per patient's insurance coverage 1 each 1  . traMADol (ULTRAM) 50 MG tablet TAKE 1 TABLET BY MOUTH TWICE DAILY 30 tablet 0  . umeclidinium-vilanterol (ANORO ELLIPTA) 62.5-25 MCG/INH AEPB Inhale 1 puff into the lungs daily. 3 each 3  . zolpidem (AMBIEN) 10 MG tablet Take 0.5 tablets (5 mg total) by mouth at bedtime as needed for sleep.     No current facility-administered medications on file prior to visit.    Allergies  Allergen Reactions  . Celecoxib Nausea And Vomiting    REACTION: GI upset  . Chicken Protein Other (See Comments)    Will not eat chicken  . Fish-Derived Products Other (See Comments)    Will not eat seafood of any kind  . Morphine Itching    REACTION: itching  . Nalbuphine Other (See Comments)    REACTION: contraindication with oxycontin.  . Prednisone Other (See Comments)    REACTION: "yeast infections"  . Venlafaxine Other (See  Comments)    REACTION: GI upset      Review of Systems:  Constitutional: No recent illness, no unintentional weight changes  Cardiac: No  chest pain, No  pressure, No palpitations  Respiratory:  +chronic shortness of breath. +chronic Cough  Gastrointestinal: No  abdominal pain, no change on bowel habits  Musculoskeletal: No new myalgia/arthralgia  Skin: No  Rash  Neurologic: No  weakness, No  Dizziness   Exam:  BP 129/79   Pulse (!) 57   Wt 145 lb (65.8 kg)   SpO2 95%   BMI 23.40 kg/m   Filed Weights   12/30/16 1409  Weight: 145 lb (65.8 kg)  Weight 07/25/2016: 138 lbs   Constitutional: VS see above. General Appearance: alert, well-developed, well-nourished, NAD  Eyes: Normal lids and conjunctive, non-icteric sclera  Ears, Nose, Mouth, Throat: MMM, Normal external inspection ears/nares/mouth/lips/gums.  Neck: No masses, trachea midline.   Respiratory: Normal respiratory effort. Diffuse bilateral wheeze/rhonchi, consistent with previous exams.  Cardiovascular: S1/S2 normal, no murmur, no rub/gallop auscultated. RRR.   Musculoskeletal: Gait normal. Symmetric and independent movement of all extremities  Neurological: Normal balance/coordination. +  tremor when holding out hands but nothing at rest.   Skin: warm, dry, intact.   Psychiatric: Normal judgment/insight. Normal mood and affect. Oriented x3.      ASSESSMENT/PLAN: COPD on 6 minute walk test doesn't qualify for oxygen but given how it's affecting his ADLs I think appropriate follow-up at this point with pulmonology for formal PFTs, consideration of other imaging studies, see if cardiac workup might be advisable. Otherwise, he never got labs done from last visit, we were following up on abnormal potassium levels and anemia  Chronic bronchitis, unspecified chronic bronchitis type ([place])  Need for immunization against influenza - Plan: Flu vaccine HIGH DOSE PF  Chronic kidney disease, stage 3  (moderate)  Anemia, unspecified type - Plan: CBC  Hyperkalemia - Plan: COMPLETE METABOLIC PANEL WITH GFR  Patient Instructions  Plan:  Recommend following up with Dr. Ashok Cordia with pulmonology to further evaluate breathing: Phone [phone]  Plan to come see me based on lab results     Follow-up plan: Return for recheck depending on labs .  Visit summary with medication list and pertinent instructions was printed for patient to review, alert [place] if any changes needed. All questions at time of visit were answered - patient instructed to contact office with any additional concerns. ER/RTC precautions were reviewed with the patient and understanding verbalized.

## 2016-12-30 NOTE — Patient Instructions (Addendum)
Plan:  Recommend following up with Dr. Ashok Cordia with pulmonology to further evaluate breathing: Phone [phone]  Plan to come see me based on lab results

## 2016-12-31 LAB — COMPLETE METABOLIC PANEL WITH GFR
AG RATIO: 1.4 (calc) (ref 1.0–2.5)
ALBUMIN MSPROF: 3.9 g/dL (ref 3.6–5.1)
ALT: 9 U/L (ref 9–46)
AST: 18 U/L (ref 10–35)
Alkaline phosphatase (APISO): 79 U/L (ref 40–115)
BILIRUBIN TOTAL: 0.2 mg/dL (ref 0.2–1.2)
BUN / CREAT RATIO: 19 (calc) (ref 6–22)
BUN: 28 mg/dL — ABNORMAL HIGH (ref 7–25)
CALCIUM: 8.7 mg/dL (ref 8.6–10.3)
CHLORIDE: 108 mmol/L (ref 98–110)
CO2: 25 mmol/L (ref 20–32)
Creat: 1.5 mg/dL — ABNORMAL HIGH (ref 0.70–1.18)
GFR, EST AFRICAN AMERICAN: 53 mL/min/{1.73_m2} — AB (ref >=60)
GFR, EST NON AFRICAN AMERICAN: 46 mL/min/{1.73_m2} — AB (ref >=60)
GLOBULIN: 2.8 g/dL (ref 1.9–3.7)
Glucose, Bld: 88 mg/dL (ref 65–99)
POTASSIUM: 5.2 mmol/L (ref 3.5–5.3)
SODIUM: 139 mmol/L (ref 135–146)
TOTAL PROTEIN: 6.7 g/dL (ref 6.1–8.1)

## 2016-12-31 LAB — CBC
HCT: 37.6 % — ABNORMAL LOW (ref 38.5–50.0)
Hemoglobin: 12.4 g/dL — ABNORMAL LOW (ref 13.2–17.1)
MCH: 30.1 pg (ref 27.0–33.0)
MCHC: 33 g/dL (ref 32.0–36.0)
MCV: 91.3 fL (ref 80.0–100.0)
MPV: 9.3 fL (ref 7.5–12.5)
Platelets: 359 10*3/uL (ref 140–400)
RBC: 4.12 10*6/uL — ABNORMAL LOW (ref 4.20–5.80)
RDW: 16.1 % — AB (ref 11.0–15.0)
WBC: 6.7 10*3/uL (ref 3.8–10.8)

## 2017-01-01 DIAGNOSIS — G894 Chronic pain syndrome: Secondary | ICD-10-CM | POA: Diagnosis not present

## 2017-01-01 DIAGNOSIS — M5136 Other intervertebral disc degeneration, lumbar region: Secondary | ICD-10-CM | POA: Diagnosis not present

## 2017-01-01 DIAGNOSIS — Z79891 Long term (current) use of opiate analgesic: Secondary | ICD-10-CM | POA: Diagnosis not present

## 2017-01-01 DIAGNOSIS — M47816 Spondylosis without myelopathy or radiculopathy, lumbar region: Secondary | ICD-10-CM | POA: Diagnosis not present

## 2017-01-01 DIAGNOSIS — M19019 Primary osteoarthritis, unspecified shoulder: Secondary | ICD-10-CM | POA: Diagnosis not present

## 2017-01-01 DIAGNOSIS — Z79899 Other long term (current) drug therapy: Secondary | ICD-10-CM | POA: Diagnosis not present

## 2017-01-14 ENCOUNTER — Other Ambulatory Visit: Payer: Self-pay

## 2017-01-14 ENCOUNTER — Ambulatory Visit: Payer: Self-pay

## 2017-01-14 DIAGNOSIS — M47816 Spondylosis without myelopathy or radiculopathy, lumbar region: Secondary | ICD-10-CM | POA: Diagnosis not present

## 2017-01-14 NOTE — Patient Outreach (Signed)
[facility]) Care Management  01/14/2017  Tyler VIERNES Sr. 11/10/1940 [id number]   Outreach attempt # 1  to patient. No answer. RN HEALTH COACH left HIPAA compliant voicemail message along with contact info.    Plan: RN HEALTH COACH will make outreach attempt to patient within three business days.   [name] RN, BSN, [place] Direct Dial:  [phone] Fax: [phone]

## 2017-01-15 ENCOUNTER — Other Ambulatory Visit: Payer: Self-pay

## 2017-01-15 NOTE — Patient Outreach (Addendum)
[facility]) Care Management  [place]  01/15/2017   Tyler REAUME Sr. [birth year] [id number]  Subjective: RN Health Coach spoke with wife/care giver for monthly call HIPAA verified. She reports that the patient's breathing is the same. She reports that he is taking all of his medications. She states that he is now taking a multivitamin. RN Health Coach discussed with the wife/caregiver about the patient staying away from people when they are sick.  She states that their family is really good about complying. She stated that the patient had two injections in the left side of his back and in two weeks will have injections on the right side.  He rates his pain at about a 6 today.  RN Health Coach reviewed the COPD action plan with the wife/caregiver.  She verbalized understanding.  Objective:   Encounter Medications:  Outpatient Encounter Prescriptions as of 01/15/2017  Medication Sig  . albuterol (PROVENTIL HFA;VENTOLIN HFA) 108 (90 Base) MCG/ACT inhaler Inhale 1-2 puffs into the lungs every 4 (four) hours as needed for wheezing or shortness of breath.  . donepezil (ARICEPT) 10 MG tablet TAKE 1 TABLET BY MOUTH AT BEDTIME  . DULoxetine (CYMBALTA) 60 MG capsule Take 1 capsule by mouth daily.  . ferrous sulfate 325 (65 FE) MG EC tablet Take 1 tablet (325 mg total) by mouth daily with breakfast.  . fluticasone (FLONASE) 50 MCG/ACT nasal spray instill 2 sprays into each nostril once daily  . HYDROcodone-acetaminophen (NORCO) 10-325 MG tablet Take 1 tablet by mouth every 4 (four) hours as needed.  Marland Kitchen ipratropium-albuterol (DUONEB) 0.5-2.5 (3) MG/3ML SOLN Take 3 mLs by nebulization 2 (two) times daily. And every 4 hours as needed for shortness of breath  . ketoconazole (NIZORAL) 2 % shampoo Apply 1 application topically 2 (two) times a week.  . lamoTRIgine (LAMICTAL) 100 MG tablet Take 50-100 mg by mouth 2 (two) times daily. 70m in the morning, and 1060min the evening  .  Multiple Vitamin (MULTIVITAMIN) tablet Take 1 tablet by mouth daily.  . Nebulizers MISC Please dispense nebulizer device per patient's insurance coverage. Sig: use with nebulized medications as directed. Dx: J96.11  . nitroGLYCERIN (NITROSTAT) 0.4 MG SL tablet Place 1 tablet (0.4 mg total) under the tongue every 5 (five) minutes as needed. For chest pain  . omeprazole (PRILOSEC) 20 MG capsule Take 1 capsule (20 mg total) by mouth 2 (two) times daily before a meal.  . OXYGEN Inhale 2.5 L into the lungs at bedtime.  . pravastatin (PRAVACHOL) 80 MG tablet Take 1 tablet (80 mg total) by mouth daily.  . Marland Kitchenespiratory Therapy Supplies (NEBULIZER/TUBING/MOUTHPIECE) KIT Please dispense all necessary nebulizer supplies per patient's insurance coverage  . umeclidinium-vilanterol (ANORO ELLIPTA) 62.5-25 MCG/INH AEPB Inhale 1 puff into the lungs daily.  . Marland Kitchenolpidem (AMBIEN) 10 MG tablet Take 0.5 tablets (5 mg total) by mouth at bedtime as needed for sleep.  . traMADol (ULTRAM) 50 MG tablet TAKE 1 TABLET BY MOUTH TWICE DAILY (Patient not taking: Reported on 01/15/2017)   No facility-administered encounter medications on file as of 01/15/2017.     Functional Status:  In your present state of health, do you have any difficulty performing the following activities: 10/08/2016  Hearing? Y  Vision? Y  Difficulty concentrating or making decisions? Y  Comment short term memory loss  Walking or climbing stairs? Y  Comment difficult for patient  Dressing or bathing? N  Doing errands, shopping? Y  Preparing Food and eating ?  Y  Using the Toilet? N  In the past six months, have you accidently leaked urine? N  Do you have problems with loss of bowel control? N  Managing your Medications? Y  Managing your Finances? Y  Housekeeping or managing your Housekeeping? Y  Some recent data might be hidden    Fall/Depression Screening: Fall Risk  12/18/2016 11/27/2016 10/08/2016  Falls in the past year? No No No   PHQ 2/9  Scores 01/15/2017 12/18/2016 07/23/2016 12/06/2015  PHQ - 2 Score 2 0 2 1  PHQ- 9 Score 10 - 8 -    Assessment: Patient continues to benefit from health coach outreach for disease management and support.   THN CM Care Plan Problem One     Most Recent Value  Care Plan Problem One  Knowledge Deficit COPD   Role Documenting the Problem One  Health Coach  Care Plan for Problem One  Active  THN Long Term Goal   Caregiver/patient will be able to describe COPD exacerbation symptoms and actions to take within the next 90 days.  THN Long Term Goal Start Date  12/18/16 Barrie Folk continued]  Interventions for Problem One Long Term Goal  RN Health Coach reviewed action plan with wife.   THN CM Short Term Goal #1   Caregiver/patient will be able to describe COPD red zone within 30 days.    THN CM Short Term Goal #1 Start Date  12/18/16 Barrie Folk continued]  Interventions for Short Term Goal #1  RN Health Coach reviewed with caregiver red zone on action plan.        Plan: RN Health Coach will provide ongoing education for patient on COPD through phone calls and sending printed information to patient for further discussion.  RN Health Coach will send update, and care plan to primary care physician. RN Health Coach will contact wife/caregiver in the month of November and wife/caregiver agrees to next outreach.    [name] RN, BSN, [employer] Direct Dial:  [phone] Fax: [phone]

## 2017-01-17 ENCOUNTER — Ambulatory Visit: Payer: Self-pay

## 2017-01-28 DIAGNOSIS — M47816 Spondylosis without myelopathy or radiculopathy, lumbar region: Secondary | ICD-10-CM | POA: Diagnosis not present

## 2017-01-29 DIAGNOSIS — M47816 Spondylosis without myelopathy or radiculopathy, lumbar region: Secondary | ICD-10-CM | POA: Diagnosis not present

## 2017-01-29 DIAGNOSIS — Z79899 Other long term (current) drug therapy: Secondary | ICD-10-CM | POA: Diagnosis not present

## 2017-01-29 DIAGNOSIS — G894 Chronic pain syndrome: Secondary | ICD-10-CM | POA: Diagnosis not present

## 2017-01-29 DIAGNOSIS — M25519 Pain in unspecified shoulder: Secondary | ICD-10-CM | POA: Diagnosis not present

## 2017-01-29 DIAGNOSIS — M5136 Other intervertebral disc degeneration, lumbar region: Secondary | ICD-10-CM | POA: Diagnosis not present

## 2017-01-29 DIAGNOSIS — Z79891 Long term (current) use of opiate analgesic: Secondary | ICD-10-CM | POA: Diagnosis not present

## 2017-02-05 ENCOUNTER — Encounter: Payer: Self-pay | Admitting: Osteopathic Medicine

## 2017-02-05 ENCOUNTER — Ambulatory Visit (INDEPENDENT_AMBULATORY_CARE_PROVIDER_SITE_OTHER): Payer: Medicare Other

## 2017-02-05 ENCOUNTER — Ambulatory Visit (INDEPENDENT_AMBULATORY_CARE_PROVIDER_SITE_OTHER): Payer: Medicare Other | Admitting: Osteopathic Medicine

## 2017-02-05 VITALS — BP 112/62 | HR 88 | Temp 97.6°F | Wt 145.0 lb

## 2017-02-05 DIAGNOSIS — R05 Cough: Secondary | ICD-10-CM | POA: Diagnosis not present

## 2017-02-05 DIAGNOSIS — I2510 Atherosclerotic heart disease of native coronary artery without angina pectoris: Secondary | ICD-10-CM

## 2017-02-05 DIAGNOSIS — J441 Chronic obstructive pulmonary disease with (acute) exacerbation: Secondary | ICD-10-CM

## 2017-02-05 DIAGNOSIS — J42 Unspecified chronic bronchitis: Secondary | ICD-10-CM

## 2017-02-05 DIAGNOSIS — J449 Chronic obstructive pulmonary disease, unspecified: Secondary | ICD-10-CM | POA: Diagnosis not present

## 2017-02-05 DIAGNOSIS — R059 Cough, unspecified: Secondary | ICD-10-CM

## 2017-02-05 MED ORDER — PREDNISONE 20 MG PO TABS: 20.0000 mg | ORAL_TABLET | Freq: Two times a day (BID) | ORAL | 0 refills | Status: DC

## 2017-02-05 MED ORDER — DONEPEZIL HCL 10 MG PO TABS: 10.0000 mg | ORAL_TABLET | Freq: Every day | ORAL | 3 refills | Status: DC

## 2017-02-05 MED ORDER — PRAVASTATIN SODIUM 40 MG PO TABS: 40.0000 mg | ORAL_TABLET | Freq: Every day | ORAL | 3 refills | Status: DC

## 2017-02-05 NOTE — Patient Instructions (Addendum)
Plan: Glad to see you are feeling better! I don't see pneumonia on the Xray If radiology report is different, I'll let you know  Any time there is a new respiratory infection or COPD exacerbation:  Continue daily maintenance inhaler use  Increase use of rescue inhaler (albuterol) or nebulizer (ipratropium-albuterol aka DuoNeb) to every 2-4 hours  Fill prescription for steroids and take for 5 days  Seek medical care if any concerns!  [name]! You've already had the flu shot this year!

## 2017-02-05 NOTE — Progress Notes (Signed)
HPI: Tyler Loh. is a 76 y.o. male with who presents to [place] today, 02/05/17,  for chief complaint of:  Chief Complaint  Patient presents with  . URI    Quite sick through the weekend, coughing, fever, chills, fatigue - "felt like death" on 12-Jun-2022 and June 13, 2022 but doing a lot better today. Hx COPD, compliant with meds as below, UTD on flu and PNA vax. Had some abdominal pain few days ago across top of abdomen but better now.  Patient is accompanied by wife who assists with history-taking.   Past medical, surgical, social and family history reviewed:  Past Medical History:  Diagnosis Date  . Allergy   . Anxiety   . Asthma   . Benign prostatic hypertrophy   . CAD (coronary artery disease)    a. s/p multiple PCIs to RCA and eventual CABG in 1999;  b.LHC 11/2003: CFX 95%, RCA occluded, SVG-RCA occluded, SVG-OM patent, native LAD patent. EF was 55%.  Patient had left to right collaterals and medical therapy was recommended    . Chronic kidney disease   . COPD (chronic obstructive pulmonary disease) ([place])   . DDD (degenerative disc disease), lumbar   . Dementia   . Depression   . Diabetes mellitus    on no medications  . Diverticulitis, colon   . Emphysema of lung ([place])   . Esophageal stricture   . GERD (gastroesophageal reflux disease)   . Herniated disc   . Hiatal hernia   . History of colonic polyps   . HOH (hard of hearing)   . Hx of colonoscopy   . Hyperlipidemia   . Hypertension   . Insomnia   . Iron deficiency anemia   . Lumbar back pain    chronic  . Memory loss   . OA (osteoarthritis)   . OSA (obstructive sleep apnea)    wife denies this  . Pneumonia   . PVD (peripheral vascular disease) ([place])   . Shortness of breath dyspnea    on home O2  . Spinal stenosis      Past Surgical History:  Procedure Laterality Date  . bilateral knee replacements    . CARPAL TUNNEL RELEASE    . CIRCUMCISION    . COLONOSCOPY  N/A 10/13/2015   Procedure: COLONOSCOPY;  Surgeon: Irene Shipper, MD;  Location: [facility] ENDOSCOPY;  Service: Endoscopy;  Laterality: N/A;  . CORONARY ANGIOPLASTY WITH STENT PLACEMENT  2/99  . CORONARY ARTERY BYPASS GRAFT  09/1997   double bypss  . EYE SURGERY  06/2011   " eye muscle   . heart bypass    . HEMORRHOID SURGERY    . SHOULDER ARTHROSCOPY  right  . STRABISMUS SURGERY  08/29/2011   Procedure: REPAIR STRABISMUS;  Surgeon: Derry Skill, MD;  Location: [place];  Service: Ophthalmology;  Laterality: Left;    Social History  Substance Use Topics  . Smoking status: Current Every Day Smoker    Packs/day: 1.00    Years: 15.00    Types: Cigarettes    Start date: 02/14/1948  . Smokeless tobacco: Never Used     Comment: Peak rate of 3ppd  . Alcohol use No     Comment: none for 20 years     Family History  Problem Relation Age of Onset  . Stroke Father   . Emphysema Father   . Heart disease Unknown   . Hypertension Unknown   . Hyperlipidemia Unknown   .  Allergies Unknown   . Diabetes Unknown   . Kidney cancer Daughter   . Breast cancer Daughter   . Colon cancer Neg Hx      Current medication list and allergy/intolerance information reviewed:    Current Outpatient Prescriptions  Medication Sig Dispense Refill  . albuterol (PROVENTIL HFA;VENTOLIN HFA) 108 (90 Base) MCG/ACT inhaler Inhale 1-2 puffs into the lungs every 4 (four) hours as needed for wheezing or shortness of breath. 2 Inhaler 12  . donepezil (ARICEPT) 10 MG tablet TAKE 1 TABLET BY MOUTH AT BEDTIME 90 tablet 0  . DULoxetine (CYMBALTA) 60 MG capsule Take 1 capsule by mouth daily.    . ferrous sulfate 325 (65 FE) MG EC tablet Take 1 tablet (325 mg total) by mouth daily with breakfast. 90 tablet 1  . fluticasone (FLONASE) 50 MCG/ACT nasal spray instill 2 sprays into each nostril once daily 16 g 11  . HYDROcodone-acetaminophen (NORCO) 10-325 MG tablet Take 1 tablet by mouth every 4 (four) hours as needed.    Marland Kitchen  ipratropium-albuterol (DUONEB) 0.5-2.5 (3) MG/3ML SOLN Take 3 mLs by nebulization 2 (two) times daily. And every 4 hours as needed for shortness of breath 180 mL 3  . ketoconazole (NIZORAL) 2 % shampoo Apply 1 application topically 2 (two) times a week. 120 mL 2  . lamoTRIgine (LAMICTAL) 100 MG tablet Take 50-100 mg by mouth 2 (two) times daily. 42m in the morning, and 1060min the evening    . Multiple Vitamin (MULTIVITAMIN) tablet Take 1 tablet by mouth daily.    . Nebulizers MISC Please dispense nebulizer device per patient's insurance coverage. Sig: use with nebulized medications as directed. Dx: J96.11 1 each 1  . nitroGLYCERIN (NITROSTAT) 0.4 MG SL tablet Place 1 tablet (0.4 mg total) under the tongue every 5 (five) minutes as needed. For chest pain 25 tablet 3  . omeprazole (PRILOSEC) 20 MG capsule Take 1 capsule (20 mg total) by mouth 2 (two) times daily before a meal. 60 capsule 11  . OXYGEN Inhale 2.5 L into the lungs at bedtime.    . pravastatin (PRAVACHOL) 80 MG tablet Take 1 tablet (80 mg total) by mouth daily. 90 tablet 0  . Respiratory Therapy Supplies (NEBULIZER/TUBING/MOUTHPIECE) KIT Please dispense all necessary nebulizer supplies per patient's insurance coverage 1 each 1  . traMADol (ULTRAM) 50 MG tablet TAKE 1 TABLET BY MOUTH TWICE DAILY 30 tablet 0  . umeclidinium-vilanterol (ANORO ELLIPTA) 62.5-25 MCG/INH AEPB Inhale 1 puff into the lungs daily. 3 each 3  . zolpidem (AMBIEN) 10 MG tablet Take 0.5 tablets (5 mg total) by mouth at bedtime as needed for sleep.     No current facility-administered medications for this visit.     Allergies  Allergen Reactions  . Celecoxib Nausea And Vomiting    REACTION: GI upset  . Chicken Protein Other (See Comments)    Will not eat chicken  . Fish-Derived Products Other (See Comments)    Will not eat seafood of any kind  . Morphine Itching    REACTION: itching  . Nalbuphine Other (See Comments)    REACTION: contraindication with  oxycontin.  . Prednisone Other (See Comments)    REACTION: "yeast infections"  . Venlafaxine Other (See Comments)    REACTION: GI upset      Review of Systems:  Constitutional:  +recent fever, +recent chills, +recent illness, No unintentional weight changes. +significant fatigue.   HEENT: No  headache, no vision change, no hearing change, No sore throat, +  sinus pressure  Cardiac: No  chest pain, No  pressure, No palpitations  Respiratory: +improved shortness of breath. +dry Cough  Musculoskeletal: No new myalgia/arthralgia  Neurologic: No  weakness, No  Dizziness    Exam:  BP 112/62   Pulse 88   Temp 97.6 F (36.4 C) (Oral)   Wt 145 lb (65.8 kg)   SpO2 95%   BMI 23.40 kg/m   Constitutional: VS see above. General Appearance: alert, well-developed, well-nourished, NAD  Eyes: Normal lids and conjunctive, non-icteric sclera  Ears, Nose, Mouth, Throat: MMM, Normal external inspection ears/nares/mouth/lips/gums. Pharynx/tonsils no erythema, no exudate. Nasal mucosa normal.   Neck: No masses, trachea midline.  Respiratory: Normal respiratory effort. (=0stable coarse breath sounds and wheezing bilaterally - chronic for him   Cardiovascular: S1/S2 normal, no murmur, no rub/gallop auscultated. RRR. No lower extremity edema.   Gastrointestinal: Nontender, no masses. No hepatomegaly, no splenomegaly. No hernia appreciated. Bowel sounds normal. Rectal exam deferred.   Musculoskeletal: Gait normal.   Neurological: Normal balance/coordination. No tremor.   Skin: warm, dry, intact.  Psychiatric: Normal judgment/insight. Normal mood and affect. Oriented x3.    CXR on personal review - stable COPD without new infiltrate or mass. Await over-read     ASSESSMENT/PLAN:   COPD with acute exacerbation ([place]) - improved from yesterday per patient. Reviewed action plan for increasing cough/URI if he gets sick again over a wekeend and can't come see me, see below  Chronic  bronchitis, unspecified chronic bronchitis type ([place]) - Plan: DG Chest 2 View  Cough - Plan: DG Chest 2 View    Patient Instructions  Plan: Glad to see you are feeling better! I don't see pneumonia on the Xray If radiology report is different, I'll let you know  Any time there is a new respiratory infection or COPD exacerbation:  Continue daily maintenance inhaler use  Increase use of rescue inhaler (albuterol) or nebulizer (ipratropium-albuterol aka DuoNeb) to every 2-4 hours  Fill prescription for steroids and take for 5 days  Seek medical care if any concerns!  Phebe Colla! You've already had the flu shot this year!     Visit summary with medication list and pertinent instructions wsa printed for patient to review. All questions at time of visit were answered - patient instructed to contact office with any additional concerns. ER/RTC precautions were reviewed with the patient. Follow-up plan: Return for recheck chronic medical problems 06/2016.  Note: Total time spent 25 minutes, greater than 50% of the visit was spent face-to-face counseling and coordinating care for the following: The primary encounter diagnosis was COPD with acute exacerbation ([place]). Diagnoses of Chronic bronchitis, unspecified chronic bronchitis type ([place]) and Cough were also pertinent to this visit.Marland Kitchen  Please note: voice recognition software was used to produce this document, and typos may escape review. Please contact me for any needed clarifications.

## 2017-02-06 ENCOUNTER — Encounter: Payer: Self-pay | Admitting: Osteopathic Medicine

## 2017-02-06 DIAGNOSIS — G8929 Other chronic pain: Secondary | ICD-10-CM | POA: Insufficient documentation

## 2017-02-14 ENCOUNTER — Other Ambulatory Visit: Payer: Self-pay

## 2017-02-14 ENCOUNTER — Ambulatory Visit (INDEPENDENT_AMBULATORY_CARE_PROVIDER_SITE_OTHER): Payer: Medicare Other | Admitting: Cardiovascular Disease

## 2017-02-14 ENCOUNTER — Encounter: Payer: Self-pay | Admitting: Cardiovascular Disease

## 2017-02-14 VITALS — BP 120/8 | HR 78 | Ht 66.0 in | Wt 144.6 lb

## 2017-02-14 DIAGNOSIS — E7800 Pure hypercholesterolemia, unspecified: Secondary | ICD-10-CM | POA: Diagnosis not present

## 2017-02-14 DIAGNOSIS — Z720 Tobacco use: Secondary | ICD-10-CM

## 2017-02-14 DIAGNOSIS — R001 Bradycardia, unspecified: Secondary | ICD-10-CM | POA: Diagnosis not present

## 2017-02-14 DIAGNOSIS — I2510 Atherosclerotic heart disease of native coronary artery without angina pectoris: Secondary | ICD-10-CM | POA: Diagnosis not present

## 2017-02-14 DIAGNOSIS — I10 Essential (primary) hypertension: Secondary | ICD-10-CM

## 2017-02-14 MED ORDER — NITROGLYCERIN 0.4 MG SL SUBL: 0.4000 mg | SUBLINGUAL_TABLET | SUBLINGUAL | 6 refills | Status: DC | PRN

## 2017-02-14 NOTE — Patient Outreach (Signed)
[facility]) Care Management  [place]  02/14/2017   Tyler TANGUMA Sr. 05-15-1940 [id number]  Subjective: Telephone call to patient for monthly outreach.  HIPAA verified. Spoke with the wife who is the caregiver. The states that the patient has been doing about the same.  He has just recently had a respiratory infections.  He was seen by his physician and has finished his medications.  Caregiver states that he is adherent with his medications. He is having some pain rates it about a six but he has medication.  The patient has had his flu shot this year and pneumonia. He tries to be active in the home but is very guarded.    Objective:   Encounter Medications:  Outpatient Encounter Medications as of 02/14/2017  Medication Sig  . albuterol (PROVENTIL HFA;VENTOLIN HFA) 108 (90 Base) MCG/ACT inhaler Inhale 1-2 puffs into the lungs every 4 (four) hours as needed for wheezing or shortness of breath.  . donepezil (ARICEPT) 10 MG tablet Take 1 tablet (10 mg total) by mouth at bedtime.  . DULoxetine (CYMBALTA) 60 MG capsule Take 1 capsule by mouth daily.  . ferrous sulfate 325 (65 FE) MG EC tablet Take 1 tablet (325 mg total) by mouth daily with breakfast.  . fluticasone (FLONASE) 50 MCG/ACT nasal spray instill 2 sprays into each nostril once daily  . HYDROcodone-acetaminophen (NORCO) 10-325 MG tablet Take 1 tablet by mouth every 4 (four) hours as needed.  Marland Kitchen ipratropium-albuterol (DUONEB) 0.5-2.5 (3) MG/3ML SOLN Take 3 mLs by nebulization 2 (two) times daily. And every 4 hours as needed for shortness of breath  . ketoconazole (NIZORAL) 2 % shampoo Apply 1 application topically 2 (two) times a week.  . lamoTRIgine (LAMICTAL) 100 MG tablet Take 50-100 mg by mouth 2 (two) times daily. 77m in the morning, and 1035min the evening  . Multiple Vitamin (MULTIVITAMIN) tablet Take 1 tablet by mouth daily.  . Nebulizers MISC Please dispense nebulizer device per patient's insurance  coverage. Sig: use with nebulized medications as directed. Dx: J96.11  . nitroGLYCERIN (NITROSTAT) 0.4 MG SL tablet Place 1 tablet (0.4 mg total) under the tongue every 5 (five) minutes as needed. For chest pain  . omeprazole (PRILOSEC) 20 MG capsule Take 1 capsule (20 mg total) by mouth 2 (two) times daily before a meal.  . OXYGEN Inhale 2.5 L into the lungs at bedtime.  . pravastatin (PRAVACHOL) 40 MG tablet Take 1 tablet (40 mg total) by mouth daily.  . Marland Kitchenespiratory Therapy Supplies (NEBULIZER/TUBING/MOUTHPIECE) KIT Please dispense all necessary nebulizer supplies per patient's insurance coverage  . umeclidinium-vilanterol (ANORO ELLIPTA) 62.5-25 MCG/INH AEPB Inhale 1 puff into the lungs daily.  . Marland Kitchenolpidem (AMBIEN) 10 MG tablet Take 0.5 tablets (5 mg total) by mouth at bedtime as needed for sleep.  . predniSONE (DELTASONE) 20 MG tablet Take 1 tablet (20 mg total) by mouth 2 (two) times daily with a meal. Fill in case of acute respiratory illness or COPD exacerbation (increased cough) (Patient not taking: Reported on 02/14/2017)  . traMADol (ULTRAM) 50 MG tablet TAKE 1 TABLET BY MOUTH TWICE DAILY (Patient not taking: Reported on 02/14/2017)   No facility-administered encounter medications on file as of 02/14/2017.     Functional Status:  In your present state of health, do you have any difficulty performing the following activities: 10/08/2016  Hearing? Y  Vision? Y  Difficulty concentrating or making decisions? Y  Comment short term memory loss  Walking or climbing stairs?  Y  Comment difficult for patient  Dressing or bathing? N  Doing errands, shopping? Y  Preparing Food and eating ? Y  Using the Toilet? N  In the past six months, have you accidently leaked urine? N  Do you have problems with loss of bowel control? N  Managing your Medications? Y  Managing your Finances? Y  Housekeeping or managing your Housekeeping? Y  Some recent data might be hidden    Fall/Depression  Screening: Fall Risk  02/14/2017 12/18/2016 11/27/2016  Falls in the past year? No No No   PHQ 2/9 Scores 02/14/2017 01/15/2017 12/18/2016 07/23/2016 12/06/2015  PHQ - 2 Score 2 2 0 2 1  PHQ- 9 Score 6 10 - 8 -    Assessment: Patient continues to benefit from health coach outreach for disease management and support.   THN CM Care Plan Problem One     Most Recent Value  Care Plan Problem One  Knowledge Deficit COPD  Role Documenting the Problem One  Health Coach  Care Plan for Problem One  Active  THN Long Term Goal   Cargiver /patient will be able to describe COPD execerbation symptoms and actions to take within the next 90 days  THN Long Term Goal Start Date  02/14/17  Interventions for Problem One [place] discussed the action plan with the wife.  THN CM Short Term Goal #1   I 30 days the cargiver/patient will be able to describe COPD Red Zone  [facility] CM Short Term Goal #1 Start Date  02/14/17  Interventions for Short Term Goal #1  RN Health Coach talked over with the caregiver/patient Red zone on action plan  THN CM Short Term Goal #2   In 30 days the caregiver/patient will be able to describe steps in pursed lib breathing  THN CM Short Term Goal #2 Start Date  02/14/17  Interventions for Short Term Goal #2  [place] reviewed with caregiver/patient options to relieve increased shortness of breath episodes. RN Health Coach will send educationl  material for the caregiver/patient to review.      Plan: RN Health Coach will contact patient in the month of November and patient agrees to next outreach.   [name] RN, BSN, [place] Direct Dial:  [phone] Fax: [phone]

## 2017-02-14 NOTE — Progress Notes (Signed)
Chief Complaint  Patient presents with  . Coronary Artery Disease   History of Present Illness: 76 yo male with history of CAD s/p CABG 1999, HTN, HLD, severe COPD, ongoing tobacco abuse, DM, GERD, PAD here today for follow up. I met him in July 2015. Last cath in August 2005 demonstrated mild LAD disease, patent large OM branch with occluded distal Circumflex filling by vein graft, occluded RCA with occluded vein graft to RCA, RCA filling from left to right collaterals. He has been managed medically since that time. He continues to smoke. When I met him in July 2015, he c/o chest pain. He cancelled a stress test in 2015. Nuclear stress test August 2017 with no ischemia. Echo 12/28/15 with LVEF=60-65%, no valve disease.   He is here today for follow up. The patient denies any chest pain, palpitations, lower extremity edema, orthopnea, PND, dizziness, near syncope or syncope. He has baseline dyspnea. Still smoking with no plans to stop.   Primary Care Physician: [name], DO  Past Medical History:  Diagnosis Date  . Acute encephalopathy 12/28/2015  . Allergy   . Anxiety   . Asthma   . Benign prostatic hypertrophy   . CAD (coronary artery disease)    a. s/p multiple PCIs to RCA and eventual CABG in 1999;  b.LHC 11/2003: CFX 95%, RCA occluded, SVG-RCA occluded, SVG-OM patent, native LAD patent. EF was 55%.  Patient had left to right collaterals and medical therapy was recommended    . Chronic kidney disease   . COPD (chronic obstructive pulmonary disease) ([place])   . DDD (degenerative disc disease), lumbar   . Dementia   . Depression   . Diabetes mellitus    on no medications  . Diverticulitis, colon   . Emphysema of lung ([place])   . Esophageal stricture   . GERD (gastroesophageal reflux disease)   . Herniated disc   . Hiatal hernia   . History of colonic polyps   . HOH (hard of hearing)   . Hx of colonoscopy   . Hyperlipidemia   . Hypertension   . Insomnia   . Iron  deficiency anemia   . Lumbar back pain    chronic  . Memory loss   . OA (osteoarthritis)   . OSA (obstructive sleep apnea)    wife denies this  . Pneumonia   . PVD (peripheral vascular disease) ([place])   . Shortness of breath dyspnea    on home O2  . Spinal stenosis     Past Surgical History:  Procedure Laterality Date  . bilateral knee replacements    . CARPAL TUNNEL RELEASE    . CIRCUMCISION    . CORONARY ANGIOPLASTY WITH STENT PLACEMENT  2/99  . CORONARY ARTERY BYPASS GRAFT  09/1997   double bypss  . EYE SURGERY  06/2011   " eye muscle   . heart bypass    . HEMORRHOID SURGERY    . SHOULDER ARTHROSCOPY  right    Current Outpatient Medications  Medication Sig Dispense Refill  . albuterol (PROVENTIL HFA;VENTOLIN HFA) 108 (90 Base) MCG/ACT inhaler Inhale 1-2 puffs into the lungs every 4 (four) hours as needed for wheezing or shortness of breath. 2 Inhaler 12  . donepezil (ARICEPT) 10 MG tablet Take 1 tablet (10 mg total) by mouth at bedtime. 90 tablet 3  . DULoxetine (CYMBALTA) 60 MG capsule Take 1 capsule by mouth daily.    . ferrous sulfate 325 (65 FE) MG EC tablet Take  1 tablet (325 mg total) by mouth daily with breakfast. 90 tablet 1  . fluticasone (FLONASE) 50 MCG/ACT nasal spray instill 2 sprays into each nostril once daily 16 g 11  . HYDROcodone-acetaminophen (NORCO) 10-325 MG tablet Take 1 tablet by mouth every 4 (four) hours as needed.    Marland Kitchen ipratropium-albuterol (DUONEB) 0.5-2.5 (3) MG/3ML SOLN Take 3 mLs by nebulization 2 (two) times daily. And every 4 hours as needed for shortness of breath 180 mL 3  . ketoconazole (NIZORAL) 2 % shampoo Apply 1 application topically 2 (two) times a week. 120 mL 2  . lamoTRIgine (LAMICTAL) 100 MG tablet Take 50-100 mg by mouth 2 (two) times daily. 56m in the morning, and 1021min the evening    . Multiple Vitamin (MULTIVITAMIN) tablet Take 1 tablet by mouth daily.    . Nebulizers MISC Please dispense nebulizer device per patient's  insurance coverage. Sig: use with nebulized medications as directed. Dx: J96.11 1 each 1  . nitroGLYCERIN (NITROSTAT) 0.4 MG SL tablet Place 1 tablet (0.4 mg total) every 5 (five) minutes as needed under the tongue. For chest pain 25 tablet 6  . omeprazole (PRILOSEC) 20 MG capsule Take 1 capsule (20 mg total) by mouth 2 (two) times daily before a meal. 60 capsule 11  . OXYGEN Inhale 2.5 L into the lungs at bedtime.    . pravastatin (PRAVACHOL) 40 MG tablet Take 1 tablet (40 mg total) by mouth daily. 90 tablet 3  . Respiratory Therapy Supplies (NEBULIZER/TUBING/MOUTHPIECE) KIT Please dispense all necessary nebulizer supplies per patient's insurance coverage 1 each 1  . umeclidinium-vilanterol (ANORO ELLIPTA) 62.5-25 MCG/INH AEPB Inhale 1 puff into the lungs daily. 3 each 3  . zolpidem (AMBIEN) 10 MG tablet Take 0.5 tablets (5 mg total) by mouth at bedtime as needed for sleep.     No current facility-administered medications for this visit.     Allergies  Allergen Reactions  . Celecoxib Nausea And Vomiting    REACTION: GI upset  . Chicken Protein Other (See Comments)    Will not eat chicken  . Fish-Derived Products Other (See Comments)    Will not eat seafood of any kind  . Morphine Itching    REACTION: itching  . Nalbuphine Other (See Comments)    REACTION: contraindication with oxycontin.  . Prednisone Other (See Comments)    REACTION: "yeast infections"  . Venlafaxine Other (See Comments)    REACTION: GI upset    Social History   Socioeconomic History  . Marital status: Married    Spouse name: Not on file  . Number of children: 5  . Years of education: Not on file  . Highest education level: Not on file  Social Needs  . Financial resource strain: Not on file  . Food insecurity - worry: Not on file  . Food insecurity - inability: Not on file  . Transportation needs - medical: Not on file  . Transportation needs - non-medical: Not on file  Occupational History  .  Occupation: Retired-construction  Tobacco Use  . Smoking status: Current Every Day Smoker    Packs/day: 1.00    Years: 15.00    Pack years: 15.00    Types: Cigarettes    Start date: 02/14/1948  . Smokeless tobacco: Never Used  . Tobacco comment: Peak rate of 3ppd  Substance and Sexual Activity  . Alcohol use: No    Alcohol/week: 0.0 oz    Comment: none for 20 years   . Drug use:  No  . Sexual activity: Not on file  Other Topics Concern  . Not on file  Social History Narrative   Originally from [place]. Previously lived in [place]. Previously worked in a [employer]. 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 Does have asbestos exposure. No known mold or bird exposure.     Family History  Problem Relation Age of Onset  . Stroke Father   . Emphysema Father   . Heart disease Unknown   . Hypertension Unknown   . Hyperlipidemia Unknown   . Allergies Unknown   . Diabetes Unknown   . Kidney cancer Daughter   . Breast cancer Daughter   . Colon cancer Neg Hx     Review of Systems:  As stated in the HPI and otherwise negative.   Pulse 78   Ht '5\' 6"'  (1.676 m)   Wt 144 lb 9.6 oz (65.6 kg)   SpO2 94%   BMI 23.34 kg/m   Physical Examination:  General: Thin male, pleasant, NAD  HEENT: OP clear, mucus membranes moist  SKIN: warm, dry. No rashes. Neuro: No focal deficits  Musculoskeletal: Muscle strength 5/5 all ext  Psychiatric: Mood and affect normal  Neck: No JVD, no loud carotid bruits, no thyromegaly, no lymphadenopathy.  Lungs:Diffuse rhonci, no wheezes or crackles Cardiovascular: Loletha Grayer, regular. No murmurs, gallops or rubs. Abdomen:Soft. Bowel sounds present. Non-tender.  Extremities: No lower extremity edema.   Cardiac cath August 2005:  The main left coronary was normal.  The left anterior descending artery had some minimal irregularities, coursed  to the apex of the  heart where it bifurcated. It was essentially widely  patent with no significant stenosis and good flow throughout. There was  some bend at the junction of the mid portion and the junction of the  distal third of the RCA possibly representing bridging. However, there was  no systolic compromise present and there was good flow throughout.  There was bifurcating moderate size first diagonal before SP1 in the  proximal LAD that was normal. There was a small to moderate size DX2 at the  junction of the proximal third that was normal and there was a small DX3  from the mid LAD that was normal.  The circumflex artery gave off a moderate size long OM-1 that had no  significant stenosis, arose very proximally. The moderate size OM-2 had no  significant stenosis just beyond this and bifurcated. There was 50% smooth  narrowing just beyond this and before the PABG branch. The PABG branch  provided grade 3 collaterals to the distal RCA. There were also collaterals  from the distal LAD to the distal RCA. The PDA and PLA were visualized up  to their distal origin.  The circumflex artery beyond the PABG branch had 95% segmental stenosis  before the insertion of the graft which was seen on retrograde filling.  The right coronary was totally occluded in its proximal third just before  the previously placed tandem proximal and mid RCA stents. There was no  antegrade flow.  Saphenous vein graft to the RCA was totally occluded in its proximal portion  with a bullet shaped configuration.  The saphenous vein graft to the obtuse marginal was widely patent. There  was a large valve in the proximal third, but there was excellent flow and an  excellent anastomosis to the distal marginal branch.  It filled the distal  marginal branches and its branches well antegrade.  EKG:  EKG is ordered today. The ekg ordered today demonstrates sinus brady rate 43 bpm. P waves evident on rhythm strip.   Recent  Labs: 07/23/2016: Brain Natriuretic Peptide 36.2; TSH 1.42 12/30/2016: ALT 9; BUN 28; Creat 1.50; Hemoglobin 12.4; Platelets 359; Potassium 5.2; Sodium 139   .Lipid Panel    Component Value Date/Time   CHOL 127 04/04/2015 1351   TRIG 190 (H) 04/04/2015 1351   HDL 31 (L) 04/04/2015 1351   CHOLHDL 4.1 04/04/2015 1351   VLDL 38 (H) 04/04/2015 1351   LDLCALC 58 04/04/2015 1351   LDLDIRECT 70.0 11/09/2014 1428     Wt Readings from Last 3 Encounters:  02/14/17 144 lb 9.6 oz (65.6 kg)  02/05/17 145 lb (65.8 kg)  12/30/16 145 lb (65.8 kg)     Other studies Reviewed: Additional studies/ records that were reviewed today include: . Review of the above records demonstrates:    Assessment and Plan:   1. CAD without angina: He has no chest pain suggestive of angina. He is s/p CABG in 1999 with last cath in 2005. Stress test in August 2017. He has been on a statin but has chosen not to take daily ASA as he uses BC powders. He does not wish to stop smoking as advised. He does not wish to start a beta blocker.   2. HTN: BP controlled and on the low side today. He is on no anti-hypertensive medications. No changes.   3. HLD: He is on a statin. Check lipids now.   4.  Tobacco abuse, ongoing: Smoking cessation advised. He does not wish to stop.   5. Sinus bradycardia: No dizziness. He has had a low heart rate for years. Follow for now.   Current medicines are reviewed at length with the patient today.  The patient does not have concerns regarding medicines.  The following changes have been made:  no change  Labs/ tests ordered today include:   Orders Placed This Encounter  Procedures  . Lipid Profile  . EKG 12-Lead    Disposition:   FU with me in 12  months  Signed, Lauree Chandler, MD 02/14/2017 [facility] [place], [place],   [address] Phone: [phone]; Fax: [phone]

## 2017-02-14 NOTE — Patient Instructions (Signed)
Medication Instructions:  Your physician recommends that you continue on your current medications as directed. Please refer to the Current Medication list given to you today.   Labwork: Have lipid profile checked at [facility].  You have a prescription for this.  This will be fasting.  Testing/Procedures: none  Follow-Up: Your physician recommends that you schedule a follow-up appointment in: 12 months. Please call our office in July or August to schedule this appointment    Any Other Special Instructions Will Be Listed Below (If Applicable).     If you need a refill on your cardiac medications before your next appointment, please call your pharmacy.

## 2017-02-14 NOTE — Patient Outreach (Signed)
[facility]) Care Management  02/14/2017  Tyler GRANIER Sr. 31-Jul-1940 [id number]   Telephone call to patient for monthly outreach.  No answer. HIPAA compliant voice message left  Plan: RN Health Coach will make outreach attempt to patient within the month of November.  [name] RN, BSN, [place] Direct Dial:  [phone] Fax: [phone]

## 2017-02-18 ENCOUNTER — Ambulatory Visit: Payer: Self-pay

## 2017-02-25 DIAGNOSIS — M5136 Other intervertebral disc degeneration, lumbar region: Secondary | ICD-10-CM | POA: Diagnosis not present

## 2017-02-25 DIAGNOSIS — Z79891 Long term (current) use of opiate analgesic: Secondary | ICD-10-CM | POA: Diagnosis not present

## 2017-02-25 DIAGNOSIS — M48061 Spinal stenosis, lumbar region without neurogenic claudication: Secondary | ICD-10-CM | POA: Diagnosis not present

## 2017-02-25 DIAGNOSIS — M25519 Pain in unspecified shoulder: Secondary | ICD-10-CM | POA: Diagnosis not present

## 2017-02-25 DIAGNOSIS — Z79899 Other long term (current) drug therapy: Secondary | ICD-10-CM | POA: Diagnosis not present

## 2017-02-25 DIAGNOSIS — G894 Chronic pain syndrome: Secondary | ICD-10-CM | POA: Diagnosis not present

## 2017-03-17 DIAGNOSIS — J9621 Acute and chronic respiratory failure with hypoxia: Secondary | ICD-10-CM | POA: Diagnosis not present

## 2017-03-17 DIAGNOSIS — I252 Old myocardial infarction: Secondary | ICD-10-CM | POA: Diagnosis not present

## 2017-03-17 DIAGNOSIS — E1151 Type 2 diabetes mellitus with diabetic peripheral angiopathy without gangrene: Secondary | ICD-10-CM | POA: Diagnosis present

## 2017-03-17 DIAGNOSIS — I4510 Unspecified right bundle-branch block: Secondary | ICD-10-CM | POA: Diagnosis not present

## 2017-03-17 DIAGNOSIS — F418 Other specified anxiety disorders: Secondary | ICD-10-CM | POA: Diagnosis not present

## 2017-03-17 DIAGNOSIS — J189 Pneumonia, unspecified organism: Secondary | ICD-10-CM | POA: Diagnosis present

## 2017-03-17 DIAGNOSIS — F17200 Nicotine dependence, unspecified, uncomplicated: Secondary | ICD-10-CM | POA: Diagnosis not present

## 2017-03-17 DIAGNOSIS — F329 Major depressive disorder, single episode, unspecified: Secondary | ICD-10-CM | POA: Diagnosis present

## 2017-03-17 DIAGNOSIS — R0603 Acute respiratory distress: Secondary | ICD-10-CM | POA: Diagnosis not present

## 2017-03-17 DIAGNOSIS — I2510 Atherosclerotic heart disease of native coronary artery without angina pectoris: Secondary | ICD-10-CM | POA: Diagnosis present

## 2017-03-17 DIAGNOSIS — R279 Unspecified lack of coordination: Secondary | ICD-10-CM | POA: Diagnosis not present

## 2017-03-17 DIAGNOSIS — F17210 Nicotine dependence, cigarettes, uncomplicated: Secondary | ICD-10-CM | POA: Diagnosis present

## 2017-03-17 DIAGNOSIS — J168 Pneumonia due to other specified infectious organisms: Secondary | ICD-10-CM | POA: Diagnosis not present

## 2017-03-17 DIAGNOSIS — R0682 Tachypnea, not elsewhere classified: Secondary | ICD-10-CM | POA: Diagnosis not present

## 2017-03-17 DIAGNOSIS — E43 Unspecified severe protein-calorie malnutrition: Secondary | ICD-10-CM | POA: Diagnosis not present

## 2017-03-17 DIAGNOSIS — J449 Chronic obstructive pulmonary disease, unspecified: Secondary | ICD-10-CM | POA: Diagnosis not present

## 2017-03-17 DIAGNOSIS — D509 Iron deficiency anemia, unspecified: Secondary | ICD-10-CM | POA: Diagnosis present

## 2017-03-17 DIAGNOSIS — R911 Solitary pulmonary nodule: Secondary | ICD-10-CM | POA: Diagnosis not present

## 2017-03-17 DIAGNOSIS — E1122 Type 2 diabetes mellitus with diabetic chronic kidney disease: Secondary | ICD-10-CM | POA: Diagnosis not present

## 2017-03-17 DIAGNOSIS — I517 Cardiomegaly: Secondary | ICD-10-CM | POA: Diagnosis not present

## 2017-03-17 DIAGNOSIS — I129 Hypertensive chronic kidney disease with stage 1 through stage 4 chronic kidney disease, or unspecified chronic kidney disease: Secondary | ICD-10-CM | POA: Diagnosis not present

## 2017-03-17 DIAGNOSIS — B9729 Other coronavirus as the cause of diseases classified elsewhere: Secondary | ICD-10-CM | POA: Diagnosis present

## 2017-03-17 DIAGNOSIS — F411 Generalized anxiety disorder: Secondary | ICD-10-CM | POA: Diagnosis present

## 2017-03-17 DIAGNOSIS — N189 Chronic kidney disease, unspecified: Secondary | ICD-10-CM | POA: Diagnosis not present

## 2017-03-17 DIAGNOSIS — F0390 Unspecified dementia without behavioral disturbance: Secondary | ICD-10-CM | POA: Diagnosis not present

## 2017-03-17 DIAGNOSIS — E785 Hyperlipidemia, unspecified: Secondary | ICD-10-CM | POA: Diagnosis present

## 2017-03-17 DIAGNOSIS — N183 Chronic kidney disease, stage 3 (moderate): Secondary | ICD-10-CM | POA: Diagnosis present

## 2017-03-17 DIAGNOSIS — J440 Chronic obstructive pulmonary disease with acute lower respiratory infection: Secondary | ICD-10-CM | POA: Diagnosis present

## 2017-03-17 DIAGNOSIS — R1310 Dysphagia, unspecified: Secondary | ICD-10-CM | POA: Diagnosis not present

## 2017-03-17 DIAGNOSIS — R062 Wheezing: Secondary | ICD-10-CM | POA: Diagnosis not present

## 2017-03-17 DIAGNOSIS — Z743 Need for continuous supervision: Secondary | ICD-10-CM | POA: Diagnosis not present

## 2017-03-17 DIAGNOSIS — E872 Acidosis: Secondary | ICD-10-CM | POA: Diagnosis not present

## 2017-03-17 DIAGNOSIS — R0602 Shortness of breath: Secondary | ICD-10-CM | POA: Diagnosis not present

## 2017-03-17 DIAGNOSIS — Z951 Presence of aortocoronary bypass graft: Secondary | ICD-10-CM | POA: Diagnosis not present

## 2017-03-17 DIAGNOSIS — Z9981 Dependence on supplemental oxygen: Secondary | ICD-10-CM | POA: Diagnosis not present

## 2017-03-17 DIAGNOSIS — N179 Acute kidney failure, unspecified: Secondary | ICD-10-CM | POA: Diagnosis not present

## 2017-03-17 DIAGNOSIS — R000 Tachycardia, unspecified: Secondary | ICD-10-CM | POA: Diagnosis not present

## 2017-03-17 DIAGNOSIS — K219 Gastro-esophageal reflux disease without esophagitis: Secondary | ICD-10-CM | POA: Diagnosis present

## 2017-03-17 DIAGNOSIS — R001 Bradycardia, unspecified: Secondary | ICD-10-CM | POA: Diagnosis not present

## 2017-03-17 DIAGNOSIS — N400 Enlarged prostate without lower urinary tract symptoms: Secondary | ICD-10-CM | POA: Diagnosis present

## 2017-03-17 DIAGNOSIS — R05 Cough: Secondary | ICD-10-CM | POA: Diagnosis not present

## 2017-03-20 ENCOUNTER — Other Ambulatory Visit: Payer: Self-pay

## 2017-03-20 NOTE — Patient Outreach (Addendum)
[facility]) Care Management  03/20/2017  Tyler BAUTCH Sr. May 02, 1940 [id number]   Telephone call placed to the patient for monthly assessment. HIPAA verified. Spoke with the patient's wife/caregiver and she states that the patient was just released from [facility]  In ICU for bilateral Pneumonia.  He is breathing ok and  having some pain today.  He is adherent with his medications and two more were added Prednisone and Tessalon Perles. The wife was unable to give me more information about the medications at the time.    Plan: RN Health Coach will close her dicipline and make a referral for TOC. [place] will notify [facility] assistants of case status  Rosalee Kaufman, BSN, [place] Direct Dial:  [phone] Fax: [phone]

## 2017-03-25 DIAGNOSIS — G894 Chronic pain syndrome: Secondary | ICD-10-CM | POA: Diagnosis not present

## 2017-03-25 DIAGNOSIS — Z79899 Other long term (current) drug therapy: Secondary | ICD-10-CM | POA: Diagnosis not present

## 2017-03-25 DIAGNOSIS — M5136 Other intervertebral disc degeneration, lumbar region: Secondary | ICD-10-CM | POA: Diagnosis not present

## 2017-03-25 DIAGNOSIS — M47816 Spondylosis without myelopathy or radiculopathy, lumbar region: Secondary | ICD-10-CM | POA: Diagnosis not present

## 2017-03-25 DIAGNOSIS — Z79891 Long term (current) use of opiate analgesic: Secondary | ICD-10-CM | POA: Diagnosis not present

## 2017-03-25 DIAGNOSIS — M25519 Pain in unspecified shoulder: Secondary | ICD-10-CM | POA: Diagnosis not present

## 2017-03-26 ENCOUNTER — Other Ambulatory Visit: Payer: Self-pay

## 2017-03-26 NOTE — Patient Outreach (Signed)
[facility]) Care Management  03/26/2017  Tyler STASZAK Sr. 10/30/1940 [id number]   Transition of care  Referral date: 03/20/17 Referral source: discharged from [place]: Medicare  Telephone call to patient regarding transition of care follow up. Unable to reach patient. HIPAA compliant voice message left with call back phone number.  PLAN: RNCM will attempt 2nd telephone call to patient within 3 business days.   Quinn Plowman RN,BSN,CCM [facility] Telephonic  [phone]

## 2017-03-27 ENCOUNTER — Other Ambulatory Visit: Payer: Self-pay

## 2017-03-27 NOTE — Patient Outreach (Signed)
[facility]) Care Management  03/27/2017  Tyler DEISHER Sr. 10/07/1940 [id number]  Transition of care  Referral date: 03/20/17 Referral source: discharged from [facility] [place]: Medicare Attempt #2  Telephone call to patient regarding transition of care follow up. Unable to reach patient. HIPAA compliant voice message left with call back phone number.  PLAN: RNCM will attempt 3rd telephone call to patient within 3 business days.   Quinn Plowman RN,BSN,CCM [facility] Telephonic  [phone]

## 2017-03-28 ENCOUNTER — Other Ambulatory Visit: Payer: Self-pay

## 2017-03-28 NOTE — Patient Outreach (Signed)
[facility]) Care Management  03/28/2017  [name] Sr. 1940-10-19 [id number]    Transition of Care Referral Referral Date: 03/20/17 Referral Source: Medicare Discharge Report Discharge Date: 03/19/17 Facility: [facility] Diagnosis: SOB, COPD Insurance: Medicare   Outreach attempt #3 to patient. No answer at present. Multiple attempts to establish contact with patient.     Plan: RN CM will send unsuccessful outreach letter to patient and close case if no response within 10 business days.  Enzo Montgomery, RN,BSN,CCM [place] Management Telephonic Care Management Coordinator Direct Phone: [phone] Toll Free: [phone] Fax: [phone]

## 2017-04-03 ENCOUNTER — Ambulatory Visit (INDEPENDENT_AMBULATORY_CARE_PROVIDER_SITE_OTHER): Payer: Medicare Other | Admitting: Osteopathic Medicine

## 2017-04-03 ENCOUNTER — Encounter: Payer: Self-pay | Admitting: Osteopathic Medicine

## 2017-04-03 VITALS — BP 128/71 | HR 84 | Temp 97.5°F | Wt 146.8 lb

## 2017-04-03 DIAGNOSIS — I2510 Atherosclerotic heart disease of native coronary artery without angina pectoris: Secondary | ICD-10-CM

## 2017-04-03 DIAGNOSIS — J0100 Acute maxillary sinusitis, unspecified: Secondary | ICD-10-CM

## 2017-04-03 DIAGNOSIS — R05 Cough: Secondary | ICD-10-CM

## 2017-04-03 DIAGNOSIS — J42 Unspecified chronic bronchitis: Secondary | ICD-10-CM | POA: Diagnosis not present

## 2017-04-03 DIAGNOSIS — R059 Cough, unspecified: Secondary | ICD-10-CM

## 2017-04-03 MED ORDER — IPRATROPIUM BROMIDE 0.06 % NA SOLN: 2.0000 | Freq: Four times a day (QID) | NASAL | 1 refills | Status: AC

## 2017-04-03 MED ORDER — AMOXICILLIN-POT CLAVULANATE 875-125 MG PO TABS: 1.0000 | ORAL_TABLET | Freq: Two times a day (BID) | ORAL | 0 refills | Status: DC

## 2017-04-03 NOTE — Progress Notes (Signed)
HPI: [name]. is a 76 y.o. male presents to [facility] today, 04/03/17,  for chief complaint of:  Chief Complaint  Patient presents with  . Follow-up - hospitalization 03/17/2017 for COPD exacerbation     Records reviewed from recent hospital admission. Admitted 03/17/2017, discharged 03/19/2017. COPD exacerbation with some concern for bibasilar infiltrate on chest x-ray, treated with Rocephin and azithromycin, IV steroids. AKI resolved prior to discharge. Since home to continue prednisone, Tessalon Perles. Other home medications continued. Scheduled follow-up with pulmonary - has an appointment 04/24/2017 w/ Dr Jackey Loge.   Since discharge from the hospital, patient reports some persistent cough with occasional production of mucus. Medication list as below. Overall he states he is breathing okay and his greatest complaint is persistent sinus congestion for past 2 weeks, meds as below not helpful.   Patient is accompanied by wife who assists with history-taking.   Past medical, surgical, social and family history reviewed:  Patient Active Problem List   Diagnosis Date Noted  . Chronic pain 02/06/2017  . Hx of CABG 12/28/2015  . Dementia 12/28/2015  . H/O asbestos exposure 10/31/2015  . Loss of weight   . Chronic kidney disease 08/30/2015  . Lung nodule < 6cm on CT 08/29/2015  . Moderate protein-calorie malnutrition ([place]) 08/24/2015  . Bilateral glenohumeral osteoarthritis 06/12/2015  . Rhinitis, allergic 06/12/2015  . COPD, severe ([place]) 05/10/2015  . Bradycardia 04/11/2015  . Insomnia 04/05/2015  . Risk for falls 04/05/2015  . Type 2 diabetes mellitus with circulatory disorder ([place]) 04/05/2015  . Onychomycosis 11/25/2014  . Tinea pedis 11/25/2014  . Chest pain, atypical 10/17/2012  . Hyperkalemia 10/17/2012  . Bladder neck obstruction 01/17/2011  . CONSTIPATION 06/12/2010  . SCIATICA, LEFT 05/31/2010  . PHIMOSIS 04/06/2009  .  TOBACCO USER 01/17/2009  . DYSPHAGIA 07/25/2008  . Osteoarthritis 06/24/2008  . ANEMIA-IRON DEFICIENCY 05/20/2008  . Memory loss 05/18/2008  . Anxiety state 06/24/2007  . DEPRESSION 06/24/2007  . BENIGN PROSTATIC HYPERTROPHY 06/24/2007  . Urinary hesitancy 06/24/2007  . COLONIC POLYPS 06/12/2007  . Diabetes ([place]) 06/12/2007  . HYPERLIPIDEMIA 06/12/2007  . Essential hypertension 06/12/2007  . ANGINA PECTORIS 06/12/2007  . Coronary atherosclerosis 06/12/2007  . PERIPHERAL VASCULAR DISEASE 06/12/2007  . GERD 06/12/2007  . DIVERTICULOSIS, COLON 06/12/2007  . Degenerative disc disease, lumbar 06/12/2007  . SPINAL STENOSIS 06/12/2007  . Lumbago 06/12/2007      Social History   Tobacco Use  . Smoking status: Current Every Day Smoker    Packs/day: 1.00    Years: 15.00    Pack years: 15.00    Types: Cigarettes    Start date: 02/14/1948  . Smokeless tobacco: Never Used  . Tobacco comment: Peak rate of 3ppd  Substance Use Topics  . Alcohol use: No    Alcohol/week: 0.0 oz    Comment: none for 20 years         Current medication list and allergy/intolerance information reviewed:    Current Outpatient Medications  Medication Sig Dispense Refill  . albuterol (PROVENTIL HFA;VENTOLIN HFA) 108 (90 Base) MCG/ACT inhaler Inhale 1-2 puffs into the lungs every 4 (four) hours as needed for wheezing or shortness of breath. 2 Inhaler 12  . donepezil (ARICEPT) 10 MG tablet Take 1 tablet (10 mg total) by mouth at bedtime. 90 tablet 3  . DULoxetine (CYMBALTA) 60 MG capsule Take 1 capsule by mouth daily.    . ferrous sulfate 325 (65 FE) MG EC tablet Take 1 tablet (325  mg total) by mouth daily with breakfast. 90 tablet 1  . fluticasone (FLONASE) 50 MCG/ACT nasal spray instill 2 sprays into each nostril once daily 16 g 11  . HYDROcodone-acetaminophen (NORCO) 10-325 MG tablet Take 1 tablet by mouth every 4 (four) hours as needed.    Marland Kitchen ipratropium-albuterol (DUONEB) 0.5-2.5 (3) MG/3ML SOLN Take  3 mLs by nebulization 2 (two) times daily. And every 4 hours as needed for shortness of breath 180 mL 3  . ketoconazole (NIZORAL) 2 % shampoo Apply 1 application topically 2 (two) times a week. 120 mL 2  . lamoTRIgine (LAMICTAL) 100 MG tablet Take 50-100 mg by mouth 2 (two) times daily. 35m in the morning, and 1063min the evening    . Multiple Vitamin (MULTIVITAMIN) tablet Take 1 tablet by mouth daily.    . Nebulizers MISC Please dispense nebulizer device per patient's insurance coverage. Sig: use with nebulized medications as directed. Dx: J96.11 1 each 1  . nitroGLYCERIN (NITROSTAT) 0.4 MG SL tablet Place 1 tablet (0.4 mg total) every 5 (five) minutes as needed under the tongue. For chest pain 25 tablet 6  . omeprazole (PRILOSEC) 20 MG capsule Take 1 capsule (20 mg total) by mouth 2 (two) times daily before a meal. 60 capsule 11  . OXYGEN Inhale 2.5 L into the lungs at bedtime.    . pravastatin (PRAVACHOL) 40 MG tablet Take 1 tablet (40 mg total) by mouth daily. 90 tablet 3  . Respiratory Therapy Supplies (NEBULIZER/TUBING/MOUTHPIECE) KIT Please dispense all necessary nebulizer supplies per patient's insurance coverage 1 each 1  . umeclidinium-vilanterol (ANORO ELLIPTA) 62.5-25 MCG/INH AEPB Inhale 1 puff into the lungs daily. 3 each 3  . zolpidem (AMBIEN) 10 MG tablet Take 0.5 tablets (5 mg total) by mouth at bedtime as needed for sleep.     No current facility-administered medications for this visit.     Allergies  Allergen Reactions  . Celecoxib Nausea And Vomiting    REACTION: GI upset  . Chicken Protein Other (See Comments)    Will not eat chicken  . Fish-Derived Products Other (See Comments)    Will not eat seafood of any kind  . Morphine Itching    REACTION: itching  . Nalbuphine Other (See Comments)    REACTION: contraindication with oxycontin.  . Prednisone Other (See Comments)    REACTION: "yeast infections"  . Venlafaxine Other (See Comments)    REACTION: GI upset       Review of Systems:  Constitutional:  No  fever, no chills, +recent illness, No unintentional weight changes. +significant fatigue.   HEENT: No  headache, no vision change, no hearing change, No sore throat, +sinus pressure  Cardiac: No  chest pain, No  pressure, No palpitations, No  Orthopnea  Respiratory:  +chronic shortness of breath. +Cough  Gastrointestinal: No  abdominal pain, No  nausea  Musculoskeletal: No new myalgia/arthralgia  Skin: No  Rash   Exam:  BP 128/71   Pulse 84   Temp (!) 97.5 F (36.4 C) (Oral)   Wt 146 lb 12.8 oz (66.6 kg)   BMI 23.69 kg/m   Constitutional: VS see above. General Appearance: alert, well-developed, well-nourished, NAD  Eyes: Normal lids and conjunctive, non-icteric sclera  Ears, Nose, Mouth, Throat: MMM, Normal external inspection ears/nares/mouth/lips/gums. TM normal bilaterally. Pharynx/tonsils no erythema, no exudate. Nasal mucosa normal.   Neck: No masses, trachea midline. No thyroid enlargement. No tenderness/mass appreciated. No lymphadenopathy  Respiratory: Normal respiratory effort. +bilateral wheeze and coarse breath sounds  c/w previous exams, no rhonchi, no rales  Cardiovascular: S1/S2 normal, no murmur, no rub/gallop auscultated. RRR. No lower extremity edema.   Gastrointestinal: Nontender, no masses.   Musculoskeletal: Gait normal.  Neurological: Normal balance/coordination. No tremor.  Skin: warm, dry, intact.       ASSESSMENT/PLAN: The primary encounter diagnosis was Acute non-recurrent maxillary sinusitis. Diagnoses of Chronic bronchitis, unspecified chronic bronchitis type ([place]) and Cough were also pertinent to this visit.    Meds ordered this encounter  Medications  . amoxicillin-clavulanate (AUGMENTIN) 875-125 MG tablet    Sig: Take 1 tablet by mouth 2 (two) times daily.    Dispense:  14 tablet    Refill:  0  . ipratropium (ATROVENT) 0.06 % nasal spray    Sig: Place 2 sprays into both nostrils 4  (four) times daily.    Dispense:  15 mL    Refill:  1      Visit summary with medication list and pertinent instructions was printed for patient to review. All questions at time of visit were answered - patient instructed to contact office with any additional concerns. ER/RTC precautions were reviewed with the patient.   Follow-up plan: Return for medicare annual in 3 months or so, SOONER IF NEEDED .  Note: Total time spent 25 minutes, greater than 50% of the visit was spent face-to-face counseling and coordinating care for the following: The primary encounter diagnosis was Acute non-recurrent maxillary sinusitis. Diagnoses of Chronic bronchitis, unspecified chronic bronchitis type ([place]) and Cough were also pertinent to this visit.Marland Kitchen  Please note: voice recognition software was used to produce this document, and typos may escape review. Please contact Dr. Sheppard Coil for any needed clarifications.

## 2017-04-04 ENCOUNTER — Encounter: Payer: Self-pay | Admitting: Osteopathic Medicine

## 2017-04-04 NOTE — Patient Outreach (Signed)
[facility]) Care Management  04/04/2017  Anna 03-23-1941 [id number]   No response from patient after 3 telephone calls and outreach letter attempt  PLAN;  RNCM will refer patient to care management assistant to close due to being unable to reach. RNCM will send notification to patients primary MD of closure.   Quinn Plowman RN,BSN,CCM [facility] Telephonic  [phone]

## 2017-04-07 NOTE — Telephone Encounter (Signed)
This encounter was created in error - please disregard.

## 2017-04-11 ENCOUNTER — Other Ambulatory Visit: Payer: Self-pay

## 2017-04-11 NOTE — Telephone Encounter (Signed)
This encounter was created in error - please disregard.

## 2017-04-11 NOTE — Patient Outreach (Signed)
[facility]) Care Management  04/11/2017  CONO GEBHARD Sr. [birth year] [id number]   Transition of Care Referral Referral Date:03/20/17 Referral Source:Medicare Discharge Report Discharge Date: 03/19/17 Facility: [facility] Diagnosis: SOB, COPD Insurance:Medicare   Multiple attempts to establish contact with patient without success. No response from letter mailed to patient. Case is being closed at this time.      Plan: RN CM will notify [facility] administrative assistant of case status.     Enzo Montgomery, RN,BSN,CCM [place] Management Telephonic Care Management Coordinator Direct Phone: [phone] Toll Free: [phone] Fax: [phone]

## 2017-04-18 ENCOUNTER — Ambulatory Visit (INDEPENDENT_AMBULATORY_CARE_PROVIDER_SITE_OTHER): Payer: Medicare HMO | Admitting: Sports Medicine

## 2017-04-18 ENCOUNTER — Encounter: Payer: Self-pay | Admitting: Sports Medicine

## 2017-04-18 DIAGNOSIS — M19211 Secondary osteoarthritis, right shoulder: Secondary | ICD-10-CM | POA: Diagnosis not present

## 2017-04-18 DIAGNOSIS — M19212 Secondary osteoarthritis, left shoulder: Secondary | ICD-10-CM | POA: Diagnosis not present

## 2017-04-18 NOTE — Progress Notes (Signed)
Subjective:    CC: Bilateral shoulder pain  HPI: [name] is a pleasant 77 year old male with known bilateral glenohumeral osteoarthritis, he is not a surgical candidate due to medical comorbidities.  His last injection was in June 2018.  He is having recurrence of pain, bilateral, moderate, persistent, localized at the anterior and posterior glenohumeral joints on the left and right shoulders, no mechanical symptoms, no trauma.  I reviewed the past medical history, family history, social history, surgical history, and allergies today and no changes were needed.  Please see the problem list section below in epic for further details.  Past Medical History: Past Medical History:  Diagnosis Date  . Acute encephalopathy 12/28/2015  . Allergy   . Anxiety   . Asthma   . Benign prostatic hypertrophy   . CAD (coronary artery disease)    a. s/p multiple PCIs to RCA and eventual CABG in 1999;  b.LHC 11/2003: CFX 95%, RCA occluded, SVG-RCA occluded, SVG-OM patent, native LAD patent. EF was 55%.  Patient had left to right collaterals and medical therapy was recommended    . Chronic kidney disease   . COPD (chronic obstructive pulmonary disease) ([place])   . DDD (degenerative disc disease), lumbar   . Dementia   . Depression   . Diabetes mellitus    on no medications  . Diverticulitis, colon   . Emphysema of lung ([place])   . Esophageal stricture   . GERD (gastroesophageal reflux disease)   . Herniated disc   . Hiatal hernia   . History of colonic polyps   . HOH (hard of hearing)   . Hx of colonoscopy   . Hyperlipidemia   . Hypertension   . Insomnia   . Iron deficiency anemia   . Lumbar back pain    chronic  . Memory loss   . OA (osteoarthritis)   . OSA (obstructive sleep apnea)    wife denies this  . Pneumonia   . PVD (peripheral vascular disease) ([place])   . Shortness of breath dyspnea    on home O2  . Spinal stenosis    Past Surgical History: Past Surgical History:  Procedure  Laterality Date  . bilateral knee replacements    . CARPAL TUNNEL RELEASE    . CIRCUMCISION    . COLONOSCOPY N/A 10/13/2015   Procedure: COLONOSCOPY;  Surgeon: Irene Shipper, MD;  Location: [facility] ENDOSCOPY;  Service: Endoscopy;  Laterality: N/A;  . CORONARY ANGIOPLASTY WITH STENT PLACEMENT  2/99  . CORONARY ARTERY BYPASS GRAFT  09/1997   double bypss  . EYE SURGERY  06/2011   " eye muscle   . heart bypass    . HEMORRHOID SURGERY    . SHOULDER ARTHROSCOPY  right  . STRABISMUS SURGERY  08/29/2011   Procedure: REPAIR STRABISMUS;  Surgeon: Derry Skill, MD;  Location: [place];  Service: Ophthalmology;  Laterality: Left;   Social History: Social History   Socioeconomic History  . Marital status: Married    Spouse name: None  . Number of children: 5  . Years of education: None  . Highest education level: None  Social Needs  . Financial resource strain: None  . Food insecurity - worry: None  . Food insecurity - inability: None  . Transportation needs - medical: None  . Transportation needs - non-medical: None  Occupational History  . Occupation: Retired-construction  Tobacco Use  . Smoking status: Current Every Day Smoker    Packs/day: 1.00    Years: 15.00  Pack years: 15.00    Types: Cigarettes    Start date: 02/14/1948  . Smokeless tobacco: Never Used  . Tobacco comment: Peak rate of 3ppd  Substance and Sexual Activity  . Alcohol use: No    Alcohol/week: 0.0 oz    Comment: none for 20 years   . Drug use: No  . Sexual activity: None  Other Topics Concern  . None  Social History Narrative   Originally from [place]. Previously lived in [place]. Previously worked in a [employer]. 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 Does have asbestos exposure. No known mold or bird exposure.    Family History: Family History  Problem Relation Age of Onset  . Stroke Father   . Emphysema  Father   . Heart disease Unknown   . Hypertension Unknown   . Hyperlipidemia Unknown   . Allergies Unknown   . Diabetes Unknown   . Kidney cancer Daughter   . Breast cancer Daughter   . Colon cancer Neg Hx    Allergies: Allergies  Allergen Reactions  . Celecoxib Nausea And Vomiting    REACTION: GI upset  . Chicken Protein Other (See Comments)    Will not eat chicken  . Fish-Derived Products Other (See Comments)    Will not eat seafood of any kind  . Morphine Itching    REACTION: itching  . Nalbuphine Other (See Comments)    REACTION: contraindication with oxycontin.  . Prednisone Other (See Comments)    REACTION: "yeast infections"  . Venlafaxine Other (See Comments)    REACTION: GI upset   Medications: See med rec.  Review of Systems: No fevers, chills, night sweats, weight loss, chest pain, or shortness of breath.   Objective:    General: Well Developed, well nourished, and in no acute distress.  Neuro: Alert and oriented x3, extra-ocular muscles intact, sensation grossly intact.  HEENT: Normocephalic, atraumatic, pupils equal round reactive to light, neck supple, no masses, no lymphadenopathy, thyroid nonpalpable.  Skin: Warm and dry, no rashes. Cardiac: Regular rate and rhythm, no murmurs rubs or gallops, no lower extremity edema.  Respiratory: Clear to auscultation bilaterally. Not using accessory muscles, speaking in full sentences.  Procedure: Real-time Ultrasound Guided Injection of left glenohumeral joint Device: GE Logiq E  Verbal informed consent obtained.  Time-out conducted.  Noted no overlying erythema, induration, or other signs of local infection.  Skin prepped in a sterile fashion.  Local anesthesia: Topical Ethyl chloride.  With sterile technique and under real time ultrasound guidance: Using a posterior approach I guided a 22-gauge spinal needle into the joint and injected 1 cc kenalog 40, 2 cc lidocaine, 2 cc bupivacaine. Completed without  difficulty  Pain immediately resolved suggesting accurate placement of the medication.  Advised to call if fevers/chills, erythema, induration, drainage, or persistent bleeding.  Images permanently stored and available for review in the ultrasound unit.  Impression: Technically successful ultrasound guided injection.  Procedure: Real-time Ultrasound Guided Injection of right glenohumeral joint Device: GE Logiq E  Verbal informed consent obtained.  Time-out conducted.  Noted no overlying erythema, induration, or other signs of local infection.  Skin prepped in a sterile fashion.  Local anesthesia: Topical Ethyl chloride.  With sterile technique and under real time ultrasound guidance: Using a posterior approach I guided a 22-gauge spinal needle into the joint and injected 1 cc kenalog 40,  2 cc lidocaine, 2 cc bupivacaine. Completed without difficulty  Pain immediately resolved suggesting accurate placement of the medication.  Advised to call if fevers/chills, erythema, induration, drainage, or persistent bleeding.  Images permanently stored and available for review in the ultrasound unit.  Impression: Technically successful ultrasound guided injection.  Impression and Recommendations:    Bilateral glenohumeral osteoarthritis Repeat glenohumeral injections this time bilateral. Right side was done 7 months ago, the left side was done a year ago. Return as needed. He is not an operative candidate. ___________________________________________ Gwen Her. Dianah Field, M.D., ABFM., CAQSM. Primary Care and [place] Instructor of [place] of [facility] of Medicine

## 2017-04-18 NOTE — Assessment & Plan Note (Signed)
Repeat glenohumeral injections this time bilateral. Right side was done 7 months ago, the left side was done a year ago. Return as needed. He is not an operative candidate.

## 2017-04-21 DIAGNOSIS — G894 Chronic pain syndrome: Secondary | ICD-10-CM | POA: Diagnosis not present

## 2017-04-21 DIAGNOSIS — Z79899 Other long term (current) drug therapy: Secondary | ICD-10-CM | POA: Diagnosis not present

## 2017-04-21 DIAGNOSIS — M5136 Other intervertebral disc degeneration, lumbar region: Secondary | ICD-10-CM | POA: Diagnosis not present

## 2017-04-21 DIAGNOSIS — M25519 Pain in unspecified shoulder: Secondary | ICD-10-CM | POA: Diagnosis not present

## 2017-04-21 DIAGNOSIS — M47816 Spondylosis without myelopathy or radiculopathy, lumbar region: Secondary | ICD-10-CM | POA: Diagnosis not present

## 2017-04-21 DIAGNOSIS — Z79891 Long term (current) use of opiate analgesic: Secondary | ICD-10-CM | POA: Diagnosis not present

## 2017-05-07 DIAGNOSIS — F0281 Dementia in other diseases classified elsewhere with behavioral disturbance: Secondary | ICD-10-CM | POA: Diagnosis not present

## 2017-05-07 DIAGNOSIS — J449 Chronic obstructive pulmonary disease, unspecified: Secondary | ICD-10-CM | POA: Diagnosis not present

## 2017-05-07 DIAGNOSIS — R69 Illness, unspecified: Secondary | ICD-10-CM | POA: Diagnosis not present

## 2017-05-07 DIAGNOSIS — F330 Major depressive disorder, recurrent, mild: Secondary | ICD-10-CM | POA: Diagnosis not present

## 2017-05-07 DIAGNOSIS — F411 Generalized anxiety disorder: Secondary | ICD-10-CM | POA: Diagnosis not present

## 2017-05-14 DIAGNOSIS — I252 Old myocardial infarction: Secondary | ICD-10-CM | POA: Diagnosis not present

## 2017-05-14 DIAGNOSIS — H2511 Age-related nuclear cataract, right eye: Secondary | ICD-10-CM | POA: Diagnosis not present

## 2017-05-14 DIAGNOSIS — K449 Diaphragmatic hernia without obstruction or gangrene: Secondary | ICD-10-CM | POA: Diagnosis not present

## 2017-05-14 DIAGNOSIS — J449 Chronic obstructive pulmonary disease, unspecified: Secondary | ICD-10-CM | POA: Diagnosis not present

## 2017-05-14 DIAGNOSIS — Z951 Presence of aortocoronary bypass graft: Secondary | ICD-10-CM | POA: Diagnosis not present

## 2017-05-14 DIAGNOSIS — H5703 Miosis: Secondary | ICD-10-CM | POA: Diagnosis not present

## 2017-05-14 DIAGNOSIS — N183 Chronic kidney disease, stage 3 (moderate): Secondary | ICD-10-CM | POA: Diagnosis not present

## 2017-05-14 DIAGNOSIS — H25811 Combined forms of age-related cataract, right eye: Secondary | ICD-10-CM | POA: Diagnosis not present

## 2017-05-14 DIAGNOSIS — K219 Gastro-esophageal reflux disease without esophagitis: Secondary | ICD-10-CM | POA: Diagnosis not present

## 2017-05-14 DIAGNOSIS — J45909 Unspecified asthma, uncomplicated: Secondary | ICD-10-CM | POA: Diagnosis not present

## 2017-05-14 DIAGNOSIS — I2510 Atherosclerotic heart disease of native coronary artery without angina pectoris: Secondary | ICD-10-CM | POA: Diagnosis not present

## 2017-05-14 DIAGNOSIS — Z955 Presence of coronary angioplasty implant and graft: Secondary | ICD-10-CM | POA: Diagnosis not present

## 2017-05-20 DIAGNOSIS — G894 Chronic pain syndrome: Secondary | ICD-10-CM | POA: Diagnosis not present

## 2017-05-20 DIAGNOSIS — H5000 Unspecified esotropia: Secondary | ICD-10-CM | POA: Diagnosis not present

## 2017-05-20 DIAGNOSIS — M5136 Other intervertebral disc degeneration, lumbar region: Secondary | ICD-10-CM | POA: Diagnosis not present

## 2017-05-20 DIAGNOSIS — M47816 Spondylosis without myelopathy or radiculopathy, lumbar region: Secondary | ICD-10-CM | POA: Diagnosis not present

## 2017-05-20 DIAGNOSIS — M25519 Pain in unspecified shoulder: Secondary | ICD-10-CM | POA: Diagnosis not present

## 2017-05-20 DIAGNOSIS — Z79891 Long term (current) use of opiate analgesic: Secondary | ICD-10-CM | POA: Diagnosis not present

## 2017-05-20 DIAGNOSIS — Z48810 Encounter for surgical aftercare following surgery on the sense organs: Secondary | ICD-10-CM | POA: Diagnosis not present

## 2017-05-20 DIAGNOSIS — Z79899 Other long term (current) drug therapy: Secondary | ICD-10-CM | POA: Diagnosis not present

## 2017-05-22 ENCOUNTER — Ambulatory Visit (INDEPENDENT_AMBULATORY_CARE_PROVIDER_SITE_OTHER): Payer: Medicare HMO | Admitting: Family Medicine

## 2017-05-22 ENCOUNTER — Encounter: Payer: Self-pay | Admitting: Family Medicine

## 2017-05-22 ENCOUNTER — Ambulatory Visit (INDEPENDENT_AMBULATORY_CARE_PROVIDER_SITE_OTHER): Payer: Medicare HMO

## 2017-05-22 VITALS — BP 117/70 | HR 97 | Temp 97.6°F | Wt 140.0 lb

## 2017-05-22 DIAGNOSIS — R05 Cough: Secondary | ICD-10-CM | POA: Diagnosis not present

## 2017-05-22 DIAGNOSIS — R062 Wheezing: Secondary | ICD-10-CM | POA: Diagnosis not present

## 2017-05-22 DIAGNOSIS — J449 Chronic obstructive pulmonary disease, unspecified: Secondary | ICD-10-CM | POA: Diagnosis not present

## 2017-05-22 MED ORDER — PREDNISONE 50 MG PO TABS: 50.0000 mg | ORAL_TABLET | Freq: Every day | ORAL | 0 refills | Status: DC

## 2017-05-22 MED ORDER — BENZONATATE 200 MG PO CAPS: 200.0000 mg | ORAL_CAPSULE | Freq: Three times a day (TID) | ORAL | 0 refills | Status: DC | PRN

## 2017-05-22 MED ORDER — AZITHROMYCIN 250 MG PO TABS: 250.0000 mg | ORAL_TABLET | Freq: Every day | ORAL | 0 refills | Status: DC

## 2017-05-22 NOTE — Progress Notes (Signed)
Tyler HODAPP Sr. is a 77 y.o. male who presents to [facility]: [facility] today for cough congestion wheezing shortness of breath.  [name] notes a one-week history of worsening cough congestion wheezing and shortness of breath.  He is been using his albuterol inhaler which does help temporarily.  He has a pertinent past medical history for severe COPD.  He also has a medical history significant for recent pneumonia.  He denies new severe chest pain palpitations.  He denies any vomiting or diarrhea feels pretty well otherwise.  He thinks his current symptoms are consistent with prior episodes of COPD exacerbation.  When asked he does not think his symptoms are bad enough to necessitate hospitalization.   Past Medical History:  Diagnosis Date  . Acute encephalopathy 12/28/2015  . Allergy   . Anxiety   . Asthma   . Benign prostatic hypertrophy   . CAD (coronary artery disease)    a. s/p multiple PCIs to RCA and eventual CABG in 1999;  b.LHC 11/2003: CFX 95%, RCA occluded, SVG-RCA occluded, SVG-OM patent, native LAD patent. EF was 55%.  Patient had left to right collaterals and medical therapy was recommended    . Chronic kidney disease   . COPD (chronic obstructive pulmonary disease) ([place])   . DDD (degenerative disc disease), lumbar   . Dementia   . Depression   . Diabetes mellitus    on no medications  . Diverticulitis, colon   . Emphysema of lung ([place])   . Esophageal stricture   . GERD (gastroesophageal reflux disease)   . Herniated disc   . Hiatal hernia   . History of colonic polyps   . HOH (hard of hearing)   . Hx of colonoscopy   . Hyperlipidemia   . Hypertension   . Insomnia   . Iron deficiency anemia   . Lumbar back pain    chronic  . Memory loss   . OA (osteoarthritis)   . OSA (obstructive sleep apnea)    wife denies this  . Pneumonia   . PVD  (peripheral vascular disease) ([place])   . Shortness of breath dyspnea    on home O2  . Spinal stenosis    Past Surgical History:  Procedure Laterality Date  . bilateral knee replacements    . CARPAL TUNNEL RELEASE    . CIRCUMCISION    . COLONOSCOPY N/A 10/13/2015   Procedure: COLONOSCOPY;  Surgeon: Irene Shipper, MD;  Location: [facility] ENDOSCOPY;  Service: Endoscopy;  Laterality: N/A;  . CORONARY ANGIOPLASTY WITH STENT PLACEMENT  2/99  . CORONARY ARTERY BYPASS GRAFT  09/1997   double bypss  . EYE SURGERY  06/2011   " eye muscle   . heart bypass    . HEMORRHOID SURGERY    . SHOULDER ARTHROSCOPY  right  . STRABISMUS SURGERY  08/29/2011   Procedure: REPAIR STRABISMUS;  Surgeon: Derry Skill, MD;  Location: [place];  Service: Ophthalmology;  Laterality: Left;   Social History   Tobacco Use  . Smoking status: Current Every Day Smoker    Packs/day: 1.00    Years: 15.00    Pack years: 15.00    Types: Cigarettes    Start date: 02/14/1948  . Smokeless tobacco: Never Used  . Tobacco comment: Peak rate of 3ppd  Substance Use Topics  . Alcohol use: No    Alcohol/week: 0.0 oz    Comment: none for 20 years  family history includes Allergies in his unknown relative; Breast cancer in his daughter; Diabetes in his unknown relative; Emphysema in his father; Heart disease in his unknown relative; Hyperlipidemia in his unknown relative; Hypertension in his unknown relative; Kidney cancer in his daughter; Stroke in his father.  ROS as above:  Medications: Current Outpatient Medications  Medication Sig Dispense Refill  . albuterol (PROVENTIL HFA;VENTOLIN HFA) 108 (90 Base) MCG/ACT inhaler Inhale 1-2 puffs into the lungs every 4 (four) hours as needed for wheezing or shortness of breath. 2 Inhaler 12  . donepezil (ARICEPT) 10 MG tablet Take 1 tablet (10 mg total) by mouth at bedtime. 90 tablet 3  . DULoxetine (CYMBALTA) 60 MG capsule Take 1 capsule by mouth daily.    . ferrous sulfate 325 (65 FE) MG  EC tablet Take 1 tablet (325 mg total) by mouth daily with breakfast. 90 tablet 1  . fluticasone (FLONASE) 50 MCG/ACT nasal spray instill 2 sprays into each nostril once daily 16 g 11  . HYDROcodone-acetaminophen (NORCO) 10-325 MG tablet Take 1 tablet by mouth every 4 (four) hours as needed.    Marland Kitchen ipratropium (ATROVENT) 0.06 % nasal spray Place 2 sprays into both nostrils 4 (four) times daily. 15 mL 1  . ipratropium-albuterol (DUONEB) 0.5-2.5 (3) MG/3ML SOLN Take 3 mLs by nebulization 2 (two) times daily. And every 4 hours as needed for shortness of breath 180 mL 3  . ketoconazole (NIZORAL) 2 % shampoo Apply 1 application topically 2 (two) times a week. 120 mL 2  . lamoTRIgine (LAMICTAL) 100 MG tablet Take 50-100 mg by mouth 2 (two) times daily. 11m in the morning, and 1054min the evening    . Multiple Vitamin (MULTIVITAMIN) tablet Take 1 tablet by mouth daily.    . Nebulizers MISC Please dispense nebulizer device per patient's insurance coverage. Sig: use with nebulized medications as directed. Dx: J96.11 1 each 1  . nitroGLYCERIN (NITROSTAT) 0.4 MG SL tablet Place 1 tablet (0.4 mg total) every 5 (five) minutes as needed under the tongue. For chest pain 25 tablet 6  . omeprazole (PRILOSEC) 20 MG capsule Take 1 capsule (20 mg total) by mouth 2 (two) times daily before a meal. 60 capsule 11  . OXYGEN Inhale 2.5 L into the lungs at bedtime.    . pravastatin (PRAVACHOL) 40 MG tablet Take 1 tablet (40 mg total) by mouth daily. 90 tablet 3  . Respiratory Therapy Supplies (NEBULIZER/TUBING/MOUTHPIECE) KIT Please dispense all necessary nebulizer supplies per patient's insurance coverage 1 each 1  . umeclidinium-vilanterol (ANORO ELLIPTA) 62.5-25 MCG/INH AEPB Inhale 1 puff into the lungs daily. 3 each 3  . zolpidem (AMBIEN) 10 MG tablet Take 0.5 tablets (5 mg total) by mouth at bedtime as needed for sleep.    . Marland Kitchenzithromycin (ZITHROMAX) 250 MG tablet Take 1 tablet (250 mg total) by mouth daily. Take first 2  tablets together, then 1 every day until finished. 6 tablet 0  . benzonatate (TESSALON) 200 MG capsule Take 1 capsule (200 mg total) by mouth 3 (three) times daily as needed for cough. 45 capsule 0  . predniSONE (DELTASONE) 50 MG tablet Take 1 tablet (50 mg total) by mouth daily. 5 tablet 0   No current facility-administered medications for this visit.    Allergies  Allergen Reactions  . Celecoxib Nausea And Vomiting    REACTION: GI upset  . Chicken Protein Other (See Comments)    Will not eat chicken  . Fish-Derived Products Other (See Comments)  Will not eat seafood of any kind  . Morphine Itching    REACTION: itching  . Nalbuphine Other (See Comments)    REACTION: contraindication with oxycontin.  . Prednisone Other (See Comments)    REACTION: "yeast infections"  . Venlafaxine Other (See Comments)    REACTION: GI upset    Health Maintenance Health Maintenance  Topic Date Due  . FOOT EXAM  11/07/2015  . OPHTHALMOLOGY EXAM  11/07/2015  . PNA vac Low Risk Adult (2 of 2 - PPSV23) 12/01/2015  . URINE MICROALBUMIN  04/03/2016  . HEMOGLOBIN A1C  06/05/2016  . TETANUS/TDAP  05/18/2018  . COLONOSCOPY  10/12/2025  . INFLUENZA VACCINE  Completed     Exam:  BP 117/70   Pulse 97   Temp 97.6 F (36.4 C) (Oral)   Wt 140 lb (63.5 kg)   SpO2 94%   BMI 22.60 kg/m  Gen: Well NAD nontoxic appearing HEENT: EOMI,  MMM Lungs: Increased work of breathing.  Lungs appear hyperexpanded on physical exam but symmetrical.  Poor air movement bilaterally with coarse breath sounds rhonchi and wheezing present bilaterally. Heart: RRR no MRG Abd: NABS, Soft. Nondistended, Nontender Exts: Brisk capillary refill, warm and well perfused.   2 view chest x-ray independent reviewed by me: Chronic COPD pattern with expanded lungs scarring present.  Interstitial opacities present do not appear significantly changed from prior chest x-ray November 2018.  No obvious large new infiltrate. Waiting  formal radiology review   No results found for this or any previous visit (from the past 72 hour(s)). No results found.    Assessment and Plan: 77 y.o. male with exacerbation of severe COPD.  [name] has overall poor health and has an exacerbation of his severe chronic medical problems today.  Fortunately he seems to be compensated pretty well.  Obtain a chest x-ray listed above as well as treated empirically with prednisone azithromycin and Tessalon Perles.  We discussed the return to clinic precautions and will recheck with PCP as needed.   Orders Placed This Encounter  Procedures  . DG Chest 2 View    Order Specific Question:   Reason for exam:    Answer:   Cough, assess intra-thoracic pathology    Order Specific Question:   Preferred imaging location?    Answer:   [facility]   Meds ordered this encounter  Medications  . predniSONE (DELTASONE) 50 MG tablet    Sig: Take 1 tablet (50 mg total) by mouth daily.    Dispense:  5 tablet    Refill:  0  . azithromycin (ZITHROMAX) 250 MG tablet    Sig: Take 1 tablet (250 mg total) by mouth daily. Take first 2 tablets together, then 1 every day until finished.    Dispense:  6 tablet    Refill:  0  . benzonatate (TESSALON) 200 MG capsule    Sig: Take 1 capsule (200 mg total) by mouth 3 (three) times daily as needed for cough.    Dispense:  45 capsule    Refill:  0     Discussed warning signs or symptoms. Please see discharge instructions. Patient expresses understanding.

## 2017-05-22 NOTE — Patient Instructions (Signed)
Thank you for coming in today. Get xray now Take the prednisone and azithromycin Use the cough pills as needed.  Continue albuterol.  Recheck as needed.     Chronic Obstructive Pulmonary Disease Exacerbation Chronic obstructive pulmonary disease (COPD) is a common lung problem. In COPD, the flow of air from the lungs is limited. COPD exacerbations are times that breathing gets worse and you need extra treatment. Without treatment they can be life threatening. If they happen often, your lungs can become more damaged. If your COPD gets worse, your doctor may treat you with:  Medicines.  Oxygen.  Different ways to clear your airway, such as using a mask.  Follow these instructions at home:  Do not smoke.  Avoid tobacco smoke and other things that bother your lungs.  If given, take your antibiotic medicine as told. Finish the medicine even if you start to feel better.  Only take medicines as told by your doctor.  Drink enough fluids to keep your pee (urine) clear or pale yellow (unless your doctor has told you not to).  Use a cool mist machine (vaporizer).  If you use oxygen or a machine that turns liquid medicine into a mist (nebulizer), continue to use them as told.  Keep up with shots (vaccinations) as told by your doctor.  Exercise regularly.  Eat healthy foods.  Keep all doctor visits as told. Get help right away if:  You are very short of breath and it gets worse.  You have trouble talking.  You have bad chest pain.  You have blood in your spit (sputum).  You have a fever.  You keep throwing up (vomiting).  You feel weak, or you pass out (faint).  You feel confused.  You keep getting worse. This information is not intended to replace advice given to you by your health care provider. Make sure you discuss any questions you have with your health care provider. Document Released: 03/14/2011 Document Revised: 08/31/2015 Document Reviewed:  11/27/2012 Elsevier Interactive Patient Education  2017 [employer].

## 2017-05-27 DIAGNOSIS — Z48810 Encounter for surgical aftercare following surgery on the sense organs: Secondary | ICD-10-CM | POA: Diagnosis not present

## 2017-05-27 DIAGNOSIS — H532 Diplopia: Secondary | ICD-10-CM | POA: Diagnosis not present

## 2017-06-04 DIAGNOSIS — R0981 Nasal congestion: Secondary | ICD-10-CM | POA: Diagnosis not present

## 2017-06-04 DIAGNOSIS — Z951 Presence of aortocoronary bypass graft: Secondary | ICD-10-CM | POA: Diagnosis not present

## 2017-06-04 DIAGNOSIS — A09 Infectious gastroenteritis and colitis, unspecified: Secondary | ICD-10-CM | POA: Diagnosis not present

## 2017-06-04 DIAGNOSIS — R110 Nausea: Secondary | ICD-10-CM | POA: Diagnosis not present

## 2017-06-04 DIAGNOSIS — M545 Low back pain: Secondary | ICD-10-CM | POA: Diagnosis not present

## 2017-06-04 DIAGNOSIS — R109 Unspecified abdominal pain: Secondary | ICD-10-CM | POA: Diagnosis not present

## 2017-06-04 DIAGNOSIS — R0902 Hypoxemia: Secondary | ICD-10-CM | POA: Diagnosis not present

## 2017-06-04 DIAGNOSIS — R531 Weakness: Secondary | ICD-10-CM | POA: Diagnosis not present

## 2017-06-04 DIAGNOSIS — J441 Chronic obstructive pulmonary disease with (acute) exacerbation: Secondary | ICD-10-CM | POA: Diagnosis not present

## 2017-06-04 DIAGNOSIS — R1013 Epigastric pain: Secondary | ICD-10-CM | POA: Diagnosis not present

## 2017-06-04 DIAGNOSIS — I4519 Other right bundle-branch block: Secondary | ICD-10-CM | POA: Diagnosis not present

## 2017-06-04 DIAGNOSIS — K219 Gastro-esophageal reflux disease without esophagitis: Secondary | ICD-10-CM | POA: Diagnosis not present

## 2017-06-04 DIAGNOSIS — Z8639 Personal history of other endocrine, nutritional and metabolic disease: Secondary | ICD-10-CM | POA: Diagnosis not present

## 2017-06-04 DIAGNOSIS — R740 Nonspecific elevation of levels of transaminase and lactic acid dehydrogenase [LDH]: Secondary | ICD-10-CM | POA: Diagnosis not present

## 2017-06-04 DIAGNOSIS — R05 Cough: Secondary | ICD-10-CM | POA: Diagnosis not present

## 2017-06-04 DIAGNOSIS — R197 Diarrhea, unspecified: Secondary | ICD-10-CM | POA: Diagnosis not present

## 2017-06-04 DIAGNOSIS — E785 Hyperlipidemia, unspecified: Secondary | ICD-10-CM | POA: Diagnosis not present

## 2017-06-04 DIAGNOSIS — J069 Acute upper respiratory infection, unspecified: Secondary | ICD-10-CM | POA: Diagnosis not present

## 2017-06-04 DIAGNOSIS — E872 Acidosis: Secondary | ICD-10-CM | POA: Diagnosis not present

## 2017-06-04 DIAGNOSIS — R69 Illness, unspecified: Secondary | ICD-10-CM | POA: Diagnosis not present

## 2017-06-04 DIAGNOSIS — B9729 Other coronavirus as the cause of diseases classified elsewhere: Secondary | ICD-10-CM | POA: Diagnosis not present

## 2017-06-04 DIAGNOSIS — Z8701 Personal history of pneumonia (recurrent): Secondary | ICD-10-CM | POA: Diagnosis not present

## 2017-06-04 DIAGNOSIS — R001 Bradycardia, unspecified: Secondary | ICD-10-CM | POA: Diagnosis not present

## 2017-06-04 DIAGNOSIS — R0602 Shortness of breath: Secondary | ICD-10-CM | POA: Diagnosis not present

## 2017-06-04 DIAGNOSIS — J9611 Chronic respiratory failure with hypoxia: Secondary | ICD-10-CM | POA: Diagnosis not present

## 2017-06-04 DIAGNOSIS — J449 Chronic obstructive pulmonary disease, unspecified: Secondary | ICD-10-CM | POA: Diagnosis not present

## 2017-06-04 DIAGNOSIS — Z8709 Personal history of other diseases of the respiratory system: Secondary | ICD-10-CM | POA: Diagnosis not present

## 2017-06-04 DIAGNOSIS — J129 Viral pneumonia, unspecified: Secondary | ICD-10-CM | POA: Diagnosis not present

## 2017-06-04 DIAGNOSIS — K529 Noninfective gastroenteritis and colitis, unspecified: Secondary | ICD-10-CM | POA: Diagnosis not present

## 2017-06-04 DIAGNOSIS — E119 Type 2 diabetes mellitus without complications: Secondary | ICD-10-CM | POA: Diagnosis not present

## 2017-06-04 DIAGNOSIS — J189 Pneumonia, unspecified organism: Secondary | ICD-10-CM | POA: Diagnosis not present

## 2017-06-04 DIAGNOSIS — J440 Chronic obstructive pulmonary disease with acute lower respiratory infection: Secondary | ICD-10-CM | POA: Diagnosis not present

## 2017-06-04 DIAGNOSIS — E878 Other disorders of electrolyte and fluid balance, not elsewhere classified: Secondary | ICD-10-CM | POA: Diagnosis not present

## 2017-06-04 DIAGNOSIS — Z7982 Long term (current) use of aspirin: Secondary | ICD-10-CM | POA: Diagnosis not present

## 2017-06-04 DIAGNOSIS — I252 Old myocardial infarction: Secondary | ICD-10-CM | POA: Diagnosis not present

## 2017-06-04 DIAGNOSIS — I2510 Atherosclerotic heart disease of native coronary artery without angina pectoris: Secondary | ICD-10-CM | POA: Diagnosis not present

## 2017-06-08 DIAGNOSIS — I4510 Unspecified right bundle-branch block: Secondary | ICD-10-CM | POA: Diagnosis not present

## 2017-06-10 ENCOUNTER — Ambulatory Visit: Payer: Medicare HMO | Admitting: Gastroenterology

## 2017-06-16 ENCOUNTER — Inpatient Hospital Stay: Payer: Medicare HMO | Admitting: Osteopathic Medicine

## 2017-06-16 DIAGNOSIS — Z0189 Encounter for other specified special examinations: Secondary | ICD-10-CM

## 2017-06-17 DIAGNOSIS — M25519 Pain in unspecified shoulder: Secondary | ICD-10-CM | POA: Diagnosis not present

## 2017-06-17 DIAGNOSIS — G894 Chronic pain syndrome: Secondary | ICD-10-CM | POA: Diagnosis not present

## 2017-06-17 DIAGNOSIS — M5136 Other intervertebral disc degeneration, lumbar region: Secondary | ICD-10-CM | POA: Diagnosis not present

## 2017-06-17 DIAGNOSIS — M47816 Spondylosis without myelopathy or radiculopathy, lumbar region: Secondary | ICD-10-CM | POA: Diagnosis not present

## 2017-06-25 DIAGNOSIS — R69 Illness, unspecified: Secondary | ICD-10-CM | POA: Diagnosis not present

## 2017-06-25 DIAGNOSIS — F330 Major depressive disorder, recurrent, mild: Secondary | ICD-10-CM | POA: Diagnosis not present

## 2017-06-25 DIAGNOSIS — F0281 Dementia in other diseases classified elsewhere with behavioral disturbance: Secondary | ICD-10-CM | POA: Diagnosis not present

## 2017-07-02 DIAGNOSIS — F330 Major depressive disorder, recurrent, mild: Secondary | ICD-10-CM | POA: Diagnosis not present

## 2017-07-02 DIAGNOSIS — R69 Illness, unspecified: Secondary | ICD-10-CM | POA: Diagnosis not present

## 2017-07-02 DIAGNOSIS — F0281 Dementia in other diseases classified elsewhere with behavioral disturbance: Secondary | ICD-10-CM | POA: Diagnosis not present

## 2017-07-03 ENCOUNTER — Ambulatory Visit (INDEPENDENT_AMBULATORY_CARE_PROVIDER_SITE_OTHER): Payer: Medicare HMO | Admitting: Osteopathic Medicine

## 2017-07-03 ENCOUNTER — Encounter: Payer: Self-pay | Admitting: Osteopathic Medicine

## 2017-07-03 VITALS — BP 135/80 | HR 93 | Temp 97.5°F | Wt 139.1 lb

## 2017-07-03 DIAGNOSIS — J449 Chronic obstructive pulmonary disease, unspecified: Secondary | ICD-10-CM

## 2017-07-03 DIAGNOSIS — Z23 Encounter for immunization: Secondary | ICD-10-CM

## 2017-07-03 DIAGNOSIS — J019 Acute sinusitis, unspecified: Secondary | ICD-10-CM

## 2017-07-03 DIAGNOSIS — J302 Other seasonal allergic rhinitis: Secondary | ICD-10-CM

## 2017-07-03 MED ORDER — FLUTICASONE PROPIONATE 50 MCG/ACT NA SUSP: 2.0000 | Freq: Every day | NASAL | 5 refills | Status: DC

## 2017-07-03 MED ORDER — AMOXICILLIN-POT CLAVULANATE 875-125 MG PO TABS: 1.0000 | ORAL_TABLET | Freq: Two times a day (BID) | ORAL | 0 refills | Status: DC

## 2017-07-03 MED ORDER — LORATADINE 10 MG PO TABS: 10.0000 mg | ORAL_TABLET | Freq: Every day | ORAL | 11 refills | Status: DC

## 2017-07-03 MED ORDER — BENZONATATE 200 MG PO CAPS: 200.0000 mg | ORAL_CAPSULE | Freq: Three times a day (TID) | ORAL | 0 refills | Status: DC | PRN

## 2017-07-03 NOTE — Progress Notes (Signed)
HPI: [name]. is a 77 y.o. male who  has a past medical history of Acute encephalopathy (12/28/2015), Allergy, Anxiety, Asthma, Benign prostatic hypertrophy, CAD (coronary artery disease), Chronic kidney disease, COPD (chronic obstructive pulmonary disease) ([place]), DDD (degenerative disc disease), lumbar, Dementia, Depression, Diabetes mellitus, Diverticulitis, colon, Emphysema of lung ([place]), Esophageal stricture, GERD (gastroesophageal reflux disease), Herniated disc, Hiatal hernia, History of colonic polyps, HOH (hard of hearing), colonoscopy, Hyperlipidemia, Hypertension, Insomnia, Iron deficiency anemia, Lumbar back pain, Memory loss, OA (osteoarthritis), OSA (obstructive sleep apnea), Pneumonia, PVD (peripheral vascular disease) ([place]), Shortness of breath dyspnea, and Spinal stenosis.  he presents to [facility] today, 07/03/17,  for chief complaint of: Sinus and headaches  Has been going on about a month, no OTC meds tried for it. Sinus pressure, runny nose.  No allergy medications attempted.  No fever.  Frontal headache has been an issue.  Nose is running all day.  Cough may be a little bit worse but overall is about baseline.  Patient is accompanied by wife who assists with history-taking.   Past medical, surgical, social and family history reviewed:  Social History   Tobacco Use  . Smoking status: Current Every Day Smoker    Packs/day: 1.00    Years: 15.00    Pack years: 15.00    Types: Cigarettes    Start date: 02/14/1948  . Smokeless tobacco: Never Used  . Tobacco comment: Peak rate of 3ppd  Substance Use Topics  . Alcohol use: No    Alcohol/week: 0.0 oz    Comment: none for 20 years        Current medication list and allergy/intolerance information reviewed:    Current Outpatient Medications  Medication Sig Dispense Refill  . albuterol (PROVENTIL HFA;VENTOLIN HFA) 108 (90 Base) MCG/ACT inhaler Inhale 1-2 puffs into the lungs  every 4 (four) hours as needed for wheezing or shortness of breath. 2 Inhaler 12  . azithromycin (ZITHROMAX) 250 MG tablet Take 1 tablet (250 mg total) by mouth daily. Take first 2 tablets together, then 1 every day until finished. 6 tablet 0  . benzonatate (TESSALON) 200 MG capsule Take 1 capsule (200 mg total) by mouth 3 (three) times daily as needed for cough. 45 capsule 0  . donepezil (ARICEPT) 10 MG tablet Take 1 tablet (10 mg total) by mouth at bedtime. 90 tablet 3  . DULoxetine (CYMBALTA) 60 MG capsule Take 1 capsule by mouth daily.    . ferrous sulfate 325 (65 FE) MG EC tablet Take 1 tablet (325 mg total) by mouth daily with breakfast. 90 tablet 1  . fluticasone (FLONASE) 50 MCG/ACT nasal spray instill 2 sprays into each nostril once daily 16 g 11  . HYDROcodone-acetaminophen (NORCO) 10-325 MG tablet Take 1 tablet by mouth every 4 (four) hours as needed.    Marland Kitchen ipratropium (ATROVENT) 0.06 % nasal spray Place 2 sprays into both nostrils 4 (four) times daily. 15 mL 1  . ipratropium-albuterol (DUONEB) 0.5-2.5 (3) MG/3ML SOLN Take 3 mLs by nebulization 2 (two) times daily. And every 4 hours as needed for shortness of breath 180 mL 3  . ketoconazole (NIZORAL) 2 % shampoo Apply 1 application topically 2 (two) times a week. 120 mL 2  . lamoTRIgine (LAMICTAL) 100 MG tablet Take 50-100 mg by mouth 2 (two) times daily. 21m in the morning, and 1075min the evening    . Multiple Vitamin (MULTIVITAMIN) tablet Take 1 tablet by mouth daily.    .Marland Kitchen  Nebulizers MISC Please dispense nebulizer device per patient's insurance coverage. Sig: use with nebulized medications as directed. Dx: J96.11 1 each 1  . nitroGLYCERIN (NITROSTAT) 0.4 MG SL tablet Place 1 tablet (0.4 mg total) every 5 (five) minutes as needed under the tongue. For chest pain 25 tablet 6  . omeprazole (PRILOSEC) 20 MG capsule Take 1 capsule (20 mg total) by mouth 2 (two) times daily before a meal. 60 capsule 11  . OXYGEN Inhale 2.5 L into the lungs  at bedtime.    . pravastatin (PRAVACHOL) 40 MG tablet Take 1 tablet (40 mg total) by mouth daily. 90 tablet 3  . predniSONE (DELTASONE) 50 MG tablet Take 1 tablet (50 mg total) by mouth daily. 5 tablet 0  . Respiratory Therapy Supplies (NEBULIZER/TUBING/MOUTHPIECE) KIT Please dispense all necessary nebulizer supplies per patient's insurance coverage 1 each 1  . umeclidinium-vilanterol (ANORO ELLIPTA) 62.5-25 MCG/INH AEPB Inhale 1 puff into the lungs daily. 3 each 3  . zolpidem (AMBIEN) 10 MG tablet Take 0.5 tablets (5 mg total) by mouth at bedtime as needed for sleep.     No current facility-administered medications for this visit.     Allergies  Allergen Reactions  . Celecoxib Nausea And Vomiting    REACTION: GI upset  . Chicken Protein Other (See Comments)    Will not eat chicken  . Fish-Derived Products Other (See Comments)    Will not eat seafood of any kind  . Morphine Itching    REACTION: itching  . Nalbuphine Other (See Comments)    REACTION: contraindication with oxycontin.  . Prednisone Other (See Comments)    REACTION: "yeast infections"  . Venlafaxine Other (See Comments)    REACTION: GI upset      Review of Systems:  Constitutional:  No  fever, no chills, +recent illness, No unintentional weight changes. No significant fatigue.   HEENT: No  headache, no vision change, no hearing change, No sore throat, +sinus pressure  Cardiac: No  chest pain, No  pressure, No palpitations  Respiratory:  No  shortness of breath. +Cough  Gastrointestinal: No  abdominal pain, No  nausea, No  vomiting,  No  blood in stool, No  diarrhea  Musculoskeletal: No new myalgia/arthralgia  Skin: No  Rash  Neurologic: No  weakness, No  dizziness  Exam:  BP 135/80 (BP Location: Left Arm, Patient Position: Sitting, Cuff Size: Normal)   Pulse 93   Temp (!) 97.5 F (36.4 C) (Oral)   Wt 139 lb 1.3 oz (63.1 kg)   SpO2 97%   BMI 22.45 kg/m   Constitutional: VS see above. General  Appearance: alert, well-developed, well-nourished, NAD  Eyes: Normal lids and conjunctive, non-icteric sclera  Ears, Nose, Mouth, Throat: MMM, Normal external inspection ears/nares/mouth/lips/gums. TM normal bilaterally. Pharynx/tonsils no erythema, no exudate. Nasal mucosa pale.   Neck: No masses, trachea midline. No tenderness/mass appreciated. No lymphadenopathy  Respiratory: Normal respiratory effort. + Stable coarse breath sounds in all lung fields consistent with previous exams  Cardiovascular: S1/S2 normal, RRR  Skin: warm, dry, intact.     ASSESSMENT/PLAN: Trial treatment for bacterial sinusitis in addition to allergic rhinitis.  Advised antibiotics 1 week, Flonase and Claritin for month  Acute sinusitis, recurrence not specified, unspecified location - Plan: amoxicillin-clavulanate (AUGMENTIN) 875-125 MG tablet  Seasonal allergic rhinitis, unspecified trigger - Plan: loratadine (CLARITIN) 10 MG tablet, fluticasone (FLONASE) 50 MCG/ACT nasal spray  Need for pneumococcal vaccination - Plan: Pneumococcal polysaccharide vaccine 23-valent greater than or equal to 2yo  subcutaneous/IM  COPD, severe ([place]) - Plan: benzonatate (TESSALON) 200 MG capsule     Visit summary with medication list and pertinent instructions was printed for patient to review. All questions at time of visit were answered - patient instructed to contact office with any additional concerns. ER/RTC precautions were reviewed with the patient.   Follow-up plan: Return in about 1 month (around 07/31/2017) for recheck sinuses, sooner if needed .    Please note: voice recognition software was used to produce this document, and typos may escape review. Please contact Dr. Sheppard Coil for any needed clarifications.

## 2017-07-09 DIAGNOSIS — I2510 Atherosclerotic heart disease of native coronary artery without angina pectoris: Secondary | ICD-10-CM | POA: Diagnosis not present

## 2017-07-09 DIAGNOSIS — I739 Peripheral vascular disease, unspecified: Secondary | ICD-10-CM | POA: Diagnosis not present

## 2017-07-09 DIAGNOSIS — I208 Other forms of angina pectoris: Secondary | ICD-10-CM | POA: Diagnosis not present

## 2017-07-15 DIAGNOSIS — Z79899 Other long term (current) drug therapy: Secondary | ICD-10-CM | POA: Diagnosis not present

## 2017-07-15 DIAGNOSIS — Z79891 Long term (current) use of opiate analgesic: Secondary | ICD-10-CM | POA: Diagnosis not present

## 2017-07-15 DIAGNOSIS — M5136 Other intervertebral disc degeneration, lumbar region: Secondary | ICD-10-CM | POA: Diagnosis not present

## 2017-07-15 DIAGNOSIS — M47816 Spondylosis without myelopathy or radiculopathy, lumbar region: Secondary | ICD-10-CM | POA: Diagnosis not present

## 2017-07-15 DIAGNOSIS — M25519 Pain in unspecified shoulder: Secondary | ICD-10-CM | POA: Diagnosis not present

## 2017-07-15 DIAGNOSIS — G894 Chronic pain syndrome: Secondary | ICD-10-CM | POA: Diagnosis not present

## 2017-07-21 ENCOUNTER — Other Ambulatory Visit: Payer: Self-pay | Admitting: Osteopathic Medicine

## 2017-07-21 DIAGNOSIS — J449 Chronic obstructive pulmonary disease, unspecified: Secondary | ICD-10-CM

## 2017-07-24 ENCOUNTER — Ambulatory Visit (INDEPENDENT_AMBULATORY_CARE_PROVIDER_SITE_OTHER): Payer: Medicare HMO | Admitting: Osteopathic Medicine

## 2017-07-24 ENCOUNTER — Encounter: Payer: Self-pay | Admitting: Osteopathic Medicine

## 2017-07-24 DIAGNOSIS — F0281 Dementia in other diseases classified elsewhere with behavioral disturbance: Secondary | ICD-10-CM | POA: Diagnosis not present

## 2017-07-24 DIAGNOSIS — F330 Major depressive disorder, recurrent, mild: Secondary | ICD-10-CM | POA: Diagnosis not present

## 2017-07-24 DIAGNOSIS — J302 Other seasonal allergic rhinitis: Secondary | ICD-10-CM | POA: Diagnosis not present

## 2017-07-24 DIAGNOSIS — J449 Chronic obstructive pulmonary disease, unspecified: Secondary | ICD-10-CM

## 2017-07-24 DIAGNOSIS — R69 Illness, unspecified: Secondary | ICD-10-CM | POA: Diagnosis not present

## 2017-07-24 MED ORDER — BENZONATATE 200 MG PO CAPS: 200.0000 mg | ORAL_CAPSULE | Freq: Three times a day (TID) | ORAL | 3 refills | Status: DC | PRN

## 2017-07-24 MED ORDER — FLUTICASONE PROPIONATE 50 MCG/ACT NA SUSP: 2.0000 | Freq: Every day | NASAL | 5 refills | Status: DC

## 2017-07-24 NOTE — Progress Notes (Signed)
HPI: [name]. is a 77 y.o. male who  has a past medical history of Acute encephalopathy (12/28/2015), Allergy, Anxiety, Asthma, Benign prostatic hypertrophy, CAD (coronary artery disease), Chronic kidney disease, COPD (chronic obstructive pulmonary disease) ([place]), DDD (degenerative disc disease), lumbar, Dementia, Depression, Diabetes mellitus, Diverticulitis, colon, Emphysema of lung ([place]), Esophageal stricture, GERD (gastroesophageal reflux disease), Herniated disc, Hiatal hernia, History of colonic polyps, HOH (hard of hearing), colonoscopy, Hyperlipidemia, Hypertension, Insomnia, Iron deficiency anemia, Lumbar back pain, Memory loss, OA (osteoarthritis), OSA (obstructive sleep apnea), Pneumonia, PVD (peripheral vascular disease) ([place]), Shortness of breath dyspnea, and Spinal stenosis.  he presents to [facility] today, 07/24/17,  for chief complaint of:  Refills/Cough  COPD: requests refills on the tessalon. This helps the coughing a lot. No SOB, no fever/chills.   Sinuses are doing much better after the antibiotics, he is grateful. He is running low on the nasal spray.   Shoulder pain is bothering him, requests possible appointment with Dr T.   Patient is accompanied by wife who assists with history-taking.   Past medical history, surgical history, social history and family history reviewed.   Current medication list and allergy/intolerance information reviewed.    Current Outpatient Medications on File Prior to Visit  Medication Sig Dispense Refill  . albuterol (PROVENTIL HFA;VENTOLIN HFA) 108 (90 Base) MCG/ACT inhaler Inhale 1-2 puffs into the lungs every 4 (four) hours as needed for wheezing or shortness of breath. 2 Inhaler 12  . donepezil (ARICEPT) 10 MG tablet Take 1 tablet (10 mg total) by mouth at bedtime. 90 tablet 3  . DULoxetine (CYMBALTA) 60 MG capsule Take 1 capsule by mouth daily.    Marland Kitchen HYDROcodone-acetaminophen (NORCO) 10-325  MG tablet Take 1 tablet by mouth every 4 (four) hours as needed.    Marland Kitchen ipratropium (ATROVENT) 0.06 % nasal spray Place 2 sprays into both nostrils 4 (four) times daily. 15 mL 1  . ketoconazole (NIZORAL) 2 % shampoo Apply 1 application topically 2 (two) times a week. 120 mL 2  . lamoTRIgine (LAMICTAL) 100 MG tablet Take 50-100 mg by mouth 2 (two) times daily. 63m in the morning, and 1064min the evening    . loratadine (CLARITIN) 10 MG tablet Take 1 tablet (10 mg total) by mouth daily. 30 tablet 11  . Multiple Vitamin (MULTIVITAMIN) tablet Take 1 tablet by mouth daily.    . nitroGLYCERIN (NITROSTAT) 0.4 MG SL tablet Place 1 tablet (0.4 mg total) every 5 (five) minutes as needed under the tongue. For chest pain 25 tablet 6  . omeprazole (PRILOSEC) 20 MG capsule Take 1 capsule (20 mg total) by mouth 2 (two) times daily before a meal. 60 capsule 11  . OXYGEN Inhale 2.5 L into the lungs at bedtime.    . pravastatin (PRAVACHOL) 40 MG tablet Take 1 tablet (40 mg total) by mouth daily. 90 tablet 3  . Respiratory Therapy Supplies (NEBULIZER/TUBING/MOUTHPIECE) KIT Please dispense all necessary nebulizer supplies per patient's insurance coverage 1 each 1  . umeclidinium-vilanterol (ANORO ELLIPTA) 62.5-25 MCG/INH AEPB Inhale 1 puff into the lungs daily. 3 each 3  . zolpidem (AMBIEN) 10 MG tablet Take 0.5 tablets (5 mg total) by mouth at bedtime as needed for sleep.    . ferrous sulfate 325 (65 FE) MG EC tablet Take 1 tablet (325 mg total) by mouth daily with breakfast. (Patient not taking: Reported on 07/24/2017) 90 tablet 1  . ipratropium-albuterol (DUONEB) 0.5-2.5 (3) MG/3ML SOLN Take 3 mLs by  nebulization 2 (two) times daily. And every 4 hours as needed for shortness of breath (Patient not taking: Reported on 07/24/2017) 180 mL 3  . Nebulizers MISC Please dispense nebulizer device per patient's insurance coverage. Sig: use with nebulized medications as directed. Dx: J96.11 (Patient not taking: Reported on  07/24/2017) 1 each 1   No current facility-administered medications on file prior to visit.    Allergies  Allergen Reactions  . Celecoxib Nausea And Vomiting    REACTION: GI upset  . Chicken Protein Other (See Comments)    Will not eat chicken  . Fish-Derived Products Other (See Comments)    Will not eat seafood of any kind  . Morphine Itching    REACTION: itching  . Nalbuphine Other (See Comments)    REACTION: contraindication with oxycontin.  . Prednisone Other (See Comments)    REACTION: "yeast infections"  . Venlafaxine Other (See Comments)    REACTION: GI upset      Review of Systems:  Constitutional: +recent illness but pt feels recovered except cough   HEENT: No  headache, no vision change  Cardiac: No  chest pain, No  pressure, No palpitations  Respiratory:  No  shortness of breath. +Cough  Exam:  BP 120/62 (BP Location: Left Arm, Patient Position: Sitting, Cuff Size: Normal)   Pulse 93   Temp 97.6 F (36.4 C) (Oral)   Wt 137 lb 6.4 oz (62.3 kg)   BMI 22.18 kg/m   Constitutional: VS see above. General Appearance: alert, well-developed, well-nourished, NAD  Eyes: Normal lids and conjunctive, non-icteric sclera  Ears, Nose, Mouth, Throat: MMM, Normal external inspection ears/nares/mouth/lips/gums. Nasal mucosa normal.   Neck: No masses, trachea midline.   Respiratory: Normal respiratory effort. no wheeze, no rhonchi, no rales. Coarse breath sounds a little better from his baseline.   Cardiovascular: S1/S2 normal, no murmur, no rub/gallop auscultated. RRR.   Musculoskeletal: Gait normal. Symmetric and independent movement of all extremities  Neurological: Normal balance/coordination. No tremor.  Skin: warm, dry, intact.   Psychiatric: Normal judgment/insight. Normal mood and affect. Oriented x3.     ASSESSMENT/PLAN:   COPD, severe ([facility]) - chronic smokers cough  - Plan: benzonatate (TESSALON) 200 MG capsule  Seasonal allergic rhinitis,  unspecified trigger - Plan: fluticasone (FLONASE) 50 MCG/ACT nasal spray   Meds ordered this encounter  Medications  . benzonatate (TESSALON) 200 MG capsule    Sig: Take 1 capsule (200 mg total) by mouth 3 (three) times daily as needed for cough.    Dispense:  90 capsule    Refill:  3  . fluticasone (FLONASE) 50 MCG/ACT nasal spray    Sig: Place 2 sprays into both nostrils daily.    Dispense:  48 g    Refill:  5      Follow-up plan: Return in about 3 months (around 10/23/2017) for recheck COPD and blod presure, sooner if needed .  Visit summary with medication list and pertinent instructions was printed for patient to review, alert [place] if any changes needed. All questions at time of visit were answered - patient instructed to contact office with any additional concerns. ER/RTC precautions were reviewed with the patient and understanding verbalized.   Please note: voice recognition software was used to produce this document, and typos may escape review. Please contact Dr. Sheppard Coil for any needed clarifications.

## 2017-07-28 ENCOUNTER — Encounter: Payer: Self-pay | Admitting: Sports Medicine

## 2017-07-28 ENCOUNTER — Ambulatory Visit (INDEPENDENT_AMBULATORY_CARE_PROVIDER_SITE_OTHER): Payer: Medicare HMO | Admitting: Sports Medicine

## 2017-07-28 DIAGNOSIS — M19212 Secondary osteoarthritis, left shoulder: Secondary | ICD-10-CM

## 2017-07-28 DIAGNOSIS — M19211 Secondary osteoarthritis, right shoulder: Secondary | ICD-10-CM

## 2017-07-28 NOTE — Progress Notes (Signed)
Subjective:    CC: Bilateral shoulder pain  HPI: This is a 77 year old male, he has known end-stage bilateral shoulder osteoarthritis, he is done well with occasional injections.  He is not an operative candidate for shoulder arthroplasty, last injection was 3 months ago, having recurrence of pain, moderate, persistent without radiation.  Localized at the glenohumeral joint anterior and posterior aspect, no mechanical symptoms.  I reviewed the past medical history, family history, social history, surgical history, and allergies today and no changes were needed.  Please see the problem list section below in epic for further details.  Past Medical History: Past Medical History:  Diagnosis Date  . Acute encephalopathy 12/28/2015  . Allergy   . Anxiety   . Asthma   . Benign prostatic hypertrophy   . CAD (coronary artery disease)    a. s/p multiple PCIs to RCA and eventual CABG in 1999;  b.LHC 11/2003: CFX 95%, RCA occluded, SVG-RCA occluded, SVG-OM patent, native LAD patent. EF was 55%.  Patient had left to right collaterals and medical therapy was recommended    . Chronic kidney disease   . COPD (chronic obstructive pulmonary disease) ([place])   . DDD (degenerative disc disease), lumbar   . Dementia   . Depression   . Diabetes mellitus    on no medications  . Diverticulitis, colon   . Emphysema of lung ([place])   . Esophageal stricture   . GERD (gastroesophageal reflux disease)   . Herniated disc   . Hiatal hernia   . History of colonic polyps   . HOH (hard of hearing)   . Hx of colonoscopy   . Hyperlipidemia   . Hypertension   . Insomnia   . Iron deficiency anemia   . Lumbar back pain    chronic  . Memory loss   . OA (osteoarthritis)   . OSA (obstructive sleep apnea)    wife denies this  . Pneumonia   . PVD (peripheral vascular disease) ([place])   . Shortness of breath dyspnea    on home O2  . Spinal stenosis    Past Surgical History: Past Surgical History:  Procedure  Laterality Date  . bilateral knee replacements    . CARPAL TUNNEL RELEASE    . CIRCUMCISION    . COLONOSCOPY N/A 10/13/2015   Procedure: COLONOSCOPY;  Surgeon: Irene Shipper, MD;  Location: [facility] ENDOSCOPY;  Service: Endoscopy;  Laterality: N/A;  . CORONARY ANGIOPLASTY WITH STENT PLACEMENT  2/99  . CORONARY ARTERY BYPASS GRAFT  09/1997   double bypss  . EYE SURGERY  06/2011   " eye muscle   . heart bypass    . HEMORRHOID SURGERY    . SHOULDER ARTHROSCOPY  right  . STRABISMUS SURGERY  08/29/2011   Procedure: REPAIR STRABISMUS;  Surgeon: Derry Skill, MD;  Location: [facility];  Service: Ophthalmology;  Laterality: Left;   Social History: Social History   Socioeconomic History  . Marital status: Married    Spouse name: Not on file  . Number of children: 5  . Years of education: Not on file  . Highest education level: Not on file  Occupational History  . Occupation: Retired-construction  Social Needs  . Financial resource strain: Not on file  . Food insecurity:    Worry: Not on file    Inability: Not on file  . Transportation needs:    Medical: Not on file    Non-medical: Not on file  Tobacco Use  . Smoking status: Current Every  Day Smoker    Packs/day: 1.00    Years: 15.00    Pack years: 15.00    Types: Cigarettes    Start date: 02/14/1948  . Smokeless tobacco: Never Used  . Tobacco comment: Peak rate of 3ppd  Substance and Sexual Activity  . Alcohol use: No    Alcohol/week: 0.0 oz    Comment: none for 20 years   . Drug use: No  . Sexual activity: Not on file  Lifestyle  . Physical activity:    Days per week: Not on file    Minutes per session: Not on file  . Stress: Not on file  Relationships  . Social connections:    Talks on phone: Not on file    Gets together: Not on file    Attends religious service: Not on file    Active member of club or organization: Not on file    Attends meetings of clubs or organizations: Not on file    Relationship status: Not on file    Other Topics Concern  . Not on file  Social History Narrative   Originally from [place]. Previously lived in [place]. Previously worked in a [employer]. 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 Does have asbestos exposure. No known mold or bird exposure.    Family History: Family History  Problem Relation Age of Onset  . Stroke Father   . Emphysema Father   . Heart disease Unknown   . Hypertension Unknown   . Hyperlipidemia Unknown   . Allergies Unknown   . Diabetes Unknown   . Kidney cancer Daughter   . Breast cancer Daughter   . Colon cancer Neg Hx    Allergies: Allergies  Allergen Reactions  . Celecoxib Nausea And Vomiting    REACTION: GI upset  . Chicken Allergy Other (See Comments)    Will not eat chicken  . Chicken Protein Other (See Comments)    Will not eat chicken  . Fish Allergy Other (See Comments)    Patient will not eat, does NOT like  . Fish-Derived Products Other (See Comments)    Will not eat seafood of any kind  . Morphine Itching    REACTION: itching  . Nalbuphine Other (See Comments)    REACTION: contraindication with oxycontin.  . Prednisone Other (See Comments)    REACTION: "yeast infections"  . Venlafaxine Other (See Comments)    REACTION: GI upset   Medications: See med rec.  Review of Systems: No fevers, chills, night sweats, weight loss, chest pain, or shortness of breath.   Objective:    General: Well Developed, well nourished, and in no acute distress.  Neuro: Alert and oriented x3, extra-ocular muscles intact, sensation grossly intact.  HEENT: Normocephalic, atraumatic, pupils equal round reactive to light, neck supple, no masses, no lymphadenopathy, thyroid nonpalpable.  Skin: Warm and dry, no rashes. Cardiac: Regular rate and rhythm, no murmurs rubs or gallops, no lower extremity edema.  Respiratory: Clear to auscultation  bilaterally. Not using accessory muscles, speaking in full sentences.  Procedure: Real-time Ultrasound Guided Injection of left glenohumeral joint Device: GE Logiq E  Verbal informed consent obtained.  Time-out conducted.  Noted no overlying erythema, induration, or other signs of local infection.  Skin prepped in a sterile fashion.  Local anesthesia: Topical Ethyl chloride.  With sterile technique and under real time ultrasound guidance:  Using a 22-gauge spinal needle advanced into the glenohumeral joint from a posterior approach, I then injected 1 cc kenalog 40, 2 cc lidocaine, 2 cc bupivacaine. Completed without difficulty  Pain immediately resolved suggesting accurate placement of the medication.  Advised to call if fevers/chills, erythema, induration, drainage, or persistent bleeding.  Images permanently stored and available for review in the ultrasound unit.  Impression: Technically successful ultrasound guided injection.  Procedure: Real-time Ultrasound Guided Injection of right glenohumeral joint Device: GE Logiq E  Verbal informed consent obtained.  Time-out conducted.  Noted no overlying erythema, induration, or other signs of local infection.  Skin prepped in a sterile fashion.  Local anesthesia: Topical Ethyl chloride.  With sterile technique and under real time ultrasound guidance: Using a 22-gauge spinal needle advanced into the glenohumeral joint from a posterior approach, I then injected 1 cc kenalog 40, 2 cc lidocaine, 2 cc bupivacaine. Completed without difficulty  Pain immediately resolved suggesting accurate placement of the medication.  Advised to call if fevers/chills, erythema, induration, drainage, or persistent bleeding.  Images permanently stored and available for review in the ultrasound unit.  Impression: Technically successful ultrasound guided injection.  Impression and Recommendations:    Bilateral glenohumeral osteoarthritis [facility] is not an operative  candidate for shoulder arthroplasty, he has severe, end-stage bilateral glenohumeral osteoarthritis, his last set of injections were in January, repeat bilateral glenohumeral injections today. ___________________________________________ Gwen Her. Dianah Field, M.D., ABFM., CAQSM. Primary Care and [place] Instructor of [place] [facility]

## 2017-07-28 NOTE — Assessment & Plan Note (Signed)
[name] is not an operative candidate for shoulder arthroplasty, he has severe, end-stage bilateral glenohumeral osteoarthritis, his last set of injections were in January, repeat bilateral glenohumeral injections today.

## 2017-07-31 ENCOUNTER — Ambulatory Visit: Payer: Medicare HMO | Admitting: Osteopathic Medicine

## 2017-08-07 ENCOUNTER — Telehealth: Payer: Self-pay

## 2017-08-07 DIAGNOSIS — M19212 Secondary osteoarthritis, left shoulder: Principal | ICD-10-CM

## 2017-08-07 DIAGNOSIS — M19211 Secondary osteoarthritis, right shoulder: Secondary | ICD-10-CM

## 2017-08-07 MED ORDER — TRAMADOL HCL 50 MG PO TABS: 50.0000 mg | ORAL_TABLET | Freq: Three times a day (TID) | ORAL | 0 refills | Status: DC | PRN

## 2017-08-07 NOTE — Telephone Encounter (Signed)
Both are done.

## 2017-08-07 NOTE — Telephone Encounter (Signed)
Wife of pt called stating pt is still in significant pain after recent injection in right shoulder. Would like to know if you have any other suggestions at this time.

## 2017-08-07 NOTE — Telephone Encounter (Signed)
Spoke with wife of pt who states he does not have any pain meds at this time and would like a refill of tramadol. Pt also agrees to having referral placed for pain management at this time. Please assist.

## 2017-08-07 NOTE — Telephone Encounter (Signed)
He should take some of the hydrocodone if he has any, if not I will call in some tramadol.  If none of the above works he will need a referral to pain management because he is not an operative candidate.  Ultimately daily narcotic treatment would likely be the best option.  We knew the injections will not last forever.

## 2017-08-13 DIAGNOSIS — G894 Chronic pain syndrome: Secondary | ICD-10-CM | POA: Diagnosis not present

## 2017-08-13 DIAGNOSIS — Z79899 Other long term (current) drug therapy: Secondary | ICD-10-CM | POA: Diagnosis not present

## 2017-08-13 DIAGNOSIS — Z79891 Long term (current) use of opiate analgesic: Secondary | ICD-10-CM | POA: Diagnosis not present

## 2017-08-13 DIAGNOSIS — M5136 Other intervertebral disc degeneration, lumbar region: Secondary | ICD-10-CM | POA: Diagnosis not present

## 2017-08-13 DIAGNOSIS — M47816 Spondylosis without myelopathy or radiculopathy, lumbar region: Secondary | ICD-10-CM | POA: Diagnosis not present

## 2017-08-18 DIAGNOSIS — M5442 Lumbago with sciatica, left side: Secondary | ICD-10-CM | POA: Diagnosis not present

## 2017-08-18 DIAGNOSIS — M25512 Pain in left shoulder: Secondary | ICD-10-CM | POA: Diagnosis not present

## 2017-08-18 DIAGNOSIS — Z79899 Other long term (current) drug therapy: Secondary | ICD-10-CM | POA: Diagnosis not present

## 2017-08-18 DIAGNOSIS — M25511 Pain in right shoulder: Secondary | ICD-10-CM | POA: Diagnosis not present

## 2017-08-18 DIAGNOSIS — G8929 Other chronic pain: Secondary | ICD-10-CM | POA: Diagnosis not present

## 2017-08-27 DIAGNOSIS — Z885 Allergy status to narcotic agent status: Secondary | ICD-10-CM | POA: Diagnosis not present

## 2017-08-27 DIAGNOSIS — Z888 Allergy status to other drugs, medicaments and biological substances status: Secondary | ICD-10-CM | POA: Diagnosis not present

## 2017-08-27 DIAGNOSIS — Z9842 Cataract extraction status, left eye: Secondary | ICD-10-CM | POA: Diagnosis not present

## 2017-08-27 DIAGNOSIS — H50012 Monocular esotropia, left eye: Secondary | ICD-10-CM | POA: Diagnosis not present

## 2017-08-27 DIAGNOSIS — Z9841 Cataract extraction status, right eye: Secondary | ICD-10-CM | POA: Diagnosis not present

## 2017-08-27 DIAGNOSIS — Z886 Allergy status to analgesic agent status: Secondary | ICD-10-CM | POA: Diagnosis not present

## 2017-08-27 DIAGNOSIS — Z9889 Other specified postprocedural states: Secondary | ICD-10-CM | POA: Diagnosis not present

## 2017-08-28 DIAGNOSIS — J449 Chronic obstructive pulmonary disease, unspecified: Secondary | ICD-10-CM | POA: Diagnosis not present

## 2017-08-28 DIAGNOSIS — R918 Other nonspecific abnormal finding of lung field: Secondary | ICD-10-CM | POA: Diagnosis not present

## 2017-08-28 DIAGNOSIS — H524 Presbyopia: Secondary | ICD-10-CM | POA: Diagnosis not present

## 2017-08-28 DIAGNOSIS — R9389 Abnormal findings on diagnostic imaging of other specified body structures: Secondary | ICD-10-CM | POA: Diagnosis not present

## 2017-08-28 DIAGNOSIS — Z961 Presence of intraocular lens: Secondary | ICD-10-CM | POA: Diagnosis not present

## 2017-08-28 DIAGNOSIS — R69 Illness, unspecified: Secondary | ICD-10-CM | POA: Diagnosis not present

## 2017-08-28 DIAGNOSIS — R948 Abnormal results of function studies of other organs and systems: Secondary | ICD-10-CM | POA: Diagnosis not present

## 2017-08-28 DIAGNOSIS — J9691 Respiratory failure, unspecified with hypoxia: Secondary | ICD-10-CM | POA: Diagnosis not present

## 2017-09-05 DIAGNOSIS — Z950 Presence of cardiac pacemaker: Secondary | ICD-10-CM | POA: Diagnosis not present

## 2017-09-05 DIAGNOSIS — K449 Diaphragmatic hernia without obstruction or gangrene: Secondary | ICD-10-CM | POA: Diagnosis not present

## 2017-09-05 DIAGNOSIS — R918 Other nonspecific abnormal finding of lung field: Secondary | ICD-10-CM | POA: Diagnosis not present

## 2017-09-05 DIAGNOSIS — J438 Other emphysema: Secondary | ICD-10-CM | POA: Diagnosis not present

## 2017-09-05 DIAGNOSIS — J432 Centrilobular emphysema: Secondary | ICD-10-CM | POA: Diagnosis not present

## 2017-09-05 DIAGNOSIS — J984 Other disorders of lung: Secondary | ICD-10-CM | POA: Diagnosis not present

## 2017-09-05 DIAGNOSIS — R9389 Abnormal findings on diagnostic imaging of other specified body structures: Secondary | ICD-10-CM | POA: Diagnosis not present

## 2017-09-05 DIAGNOSIS — J449 Chronic obstructive pulmonary disease, unspecified: Secondary | ICD-10-CM | POA: Diagnosis not present

## 2017-09-05 DIAGNOSIS — R590 Localized enlarged lymph nodes: Secondary | ICD-10-CM | POA: Diagnosis not present

## 2017-09-05 DIAGNOSIS — I2510 Atherosclerotic heart disease of native coronary artery without angina pectoris: Secondary | ICD-10-CM | POA: Diagnosis not present

## 2017-09-12 DIAGNOSIS — M5136 Other intervertebral disc degeneration, lumbar region: Secondary | ICD-10-CM | POA: Diagnosis not present

## 2017-09-12 DIAGNOSIS — M47816 Spondylosis without myelopathy or radiculopathy, lumbar region: Secondary | ICD-10-CM | POA: Diagnosis not present

## 2017-09-12 DIAGNOSIS — G894 Chronic pain syndrome: Secondary | ICD-10-CM | POA: Diagnosis not present

## 2017-09-12 DIAGNOSIS — M25519 Pain in unspecified shoulder: Secondary | ICD-10-CM | POA: Diagnosis not present

## 2017-09-12 DIAGNOSIS — J449 Chronic obstructive pulmonary disease, unspecified: Secondary | ICD-10-CM | POA: Diagnosis not present

## 2017-09-17 DIAGNOSIS — I10 Essential (primary) hypertension: Secondary | ICD-10-CM | POA: Diagnosis not present

## 2017-09-17 DIAGNOSIS — K219 Gastro-esophageal reflux disease without esophagitis: Secondary | ICD-10-CM | POA: Diagnosis not present

## 2017-09-17 DIAGNOSIS — J209 Acute bronchitis, unspecified: Secondary | ICD-10-CM | POA: Diagnosis not present

## 2017-09-17 DIAGNOSIS — I2510 Atherosclerotic heart disease of native coronary artery without angina pectoris: Secondary | ICD-10-CM | POA: Diagnosis not present

## 2017-09-17 DIAGNOSIS — Z9181 History of falling: Secondary | ICD-10-CM | POA: Diagnosis not present

## 2017-09-17 DIAGNOSIS — R0902 Hypoxemia: Secondary | ICD-10-CM | POA: Diagnosis not present

## 2017-09-17 DIAGNOSIS — J438 Other emphysema: Secondary | ICD-10-CM | POA: Diagnosis not present

## 2017-09-17 DIAGNOSIS — E139 Other specified diabetes mellitus without complications: Secondary | ICD-10-CM | POA: Diagnosis not present

## 2017-09-17 DIAGNOSIS — R0602 Shortness of breath: Secondary | ICD-10-CM | POA: Diagnosis not present

## 2017-09-17 DIAGNOSIS — J449 Chronic obstructive pulmonary disease, unspecified: Secondary | ICD-10-CM | POA: Diagnosis not present

## 2017-09-22 DIAGNOSIS — J449 Chronic obstructive pulmonary disease, unspecified: Secondary | ICD-10-CM | POA: Diagnosis not present

## 2017-09-23 DIAGNOSIS — R69 Illness, unspecified: Secondary | ICD-10-CM | POA: Diagnosis not present

## 2017-09-23 DIAGNOSIS — F0281 Dementia in other diseases classified elsewhere with behavioral disturbance: Secondary | ICD-10-CM | POA: Diagnosis not present

## 2017-09-23 DIAGNOSIS — F330 Major depressive disorder, recurrent, mild: Secondary | ICD-10-CM | POA: Diagnosis not present

## 2017-09-30 DIAGNOSIS — R093 Abnormal sputum: Secondary | ICD-10-CM | POA: Diagnosis not present

## 2017-09-30 DIAGNOSIS — R918 Other nonspecific abnormal finding of lung field: Secondary | ICD-10-CM | POA: Diagnosis not present

## 2017-09-30 DIAGNOSIS — R9389 Abnormal findings on diagnostic imaging of other specified body structures: Secondary | ICD-10-CM | POA: Diagnosis not present

## 2017-10-12 DIAGNOSIS — M549 Dorsalgia, unspecified: Secondary | ICD-10-CM | POA: Diagnosis not present

## 2017-10-12 DIAGNOSIS — M545 Low back pain: Secondary | ICD-10-CM | POA: Diagnosis not present

## 2017-10-13 DIAGNOSIS — G894 Chronic pain syndrome: Secondary | ICD-10-CM | POA: Diagnosis not present

## 2017-10-13 DIAGNOSIS — M25519 Pain in unspecified shoulder: Secondary | ICD-10-CM | POA: Diagnosis not present

## 2017-10-13 DIAGNOSIS — M5136 Other intervertebral disc degeneration, lumbar region: Secondary | ICD-10-CM | POA: Diagnosis not present

## 2017-10-13 DIAGNOSIS — Z79891 Long term (current) use of opiate analgesic: Secondary | ICD-10-CM | POA: Diagnosis not present

## 2017-10-13 DIAGNOSIS — M47816 Spondylosis without myelopathy or radiculopathy, lumbar region: Secondary | ICD-10-CM | POA: Diagnosis not present

## 2017-10-13 DIAGNOSIS — Z79899 Other long term (current) drug therapy: Secondary | ICD-10-CM | POA: Diagnosis not present

## 2017-10-17 DIAGNOSIS — J449 Chronic obstructive pulmonary disease, unspecified: Secondary | ICD-10-CM | POA: Diagnosis not present

## 2017-10-17 DIAGNOSIS — R0902 Hypoxemia: Secondary | ICD-10-CM | POA: Diagnosis not present

## 2017-10-17 DIAGNOSIS — J438 Other emphysema: Secondary | ICD-10-CM | POA: Diagnosis not present

## 2017-10-17 DIAGNOSIS — J209 Acute bronchitis, unspecified: Secondary | ICD-10-CM | POA: Diagnosis not present

## 2017-10-17 DIAGNOSIS — K219 Gastro-esophageal reflux disease without esophagitis: Secondary | ICD-10-CM | POA: Diagnosis not present

## 2017-10-17 DIAGNOSIS — I2510 Atherosclerotic heart disease of native coronary artery without angina pectoris: Secondary | ICD-10-CM | POA: Diagnosis not present

## 2017-10-17 DIAGNOSIS — E139 Other specified diabetes mellitus without complications: Secondary | ICD-10-CM | POA: Diagnosis not present

## 2017-10-17 DIAGNOSIS — I10 Essential (primary) hypertension: Secondary | ICD-10-CM | POA: Diagnosis not present

## 2017-10-17 DIAGNOSIS — R0602 Shortness of breath: Secondary | ICD-10-CM | POA: Diagnosis not present

## 2017-10-17 DIAGNOSIS — Z9181 History of falling: Secondary | ICD-10-CM | POA: Diagnosis not present

## 2017-10-21 ENCOUNTER — Other Ambulatory Visit: Payer: Self-pay | Admitting: Physician Assistant

## 2017-10-21 ENCOUNTER — Ambulatory Visit (INDEPENDENT_AMBULATORY_CARE_PROVIDER_SITE_OTHER): Payer: Medicare HMO

## 2017-10-21 ENCOUNTER — Ambulatory Visit (INDEPENDENT_AMBULATORY_CARE_PROVIDER_SITE_OTHER): Payer: Medicare HMO | Admitting: Osteopathic Medicine

## 2017-10-21 ENCOUNTER — Encounter: Payer: Self-pay | Admitting: Osteopathic Medicine

## 2017-10-21 VITALS — BP 114/75 | HR 93 | Wt 141.8 lb

## 2017-10-21 DIAGNOSIS — E119 Type 2 diabetes mellitus without complications: Secondary | ICD-10-CM

## 2017-10-21 DIAGNOSIS — J449 Chronic obstructive pulmonary disease, unspecified: Secondary | ICD-10-CM

## 2017-10-21 DIAGNOSIS — M25511 Pain in right shoulder: Secondary | ICD-10-CM

## 2017-10-21 LAB — POCT GLYCOSYLATED HEMOGLOBIN (HGB A1C): Hemoglobin A1C: 6.1 % — AB (ref 4.0–5.6)

## 2017-10-21 NOTE — Progress Notes (Signed)
HPI: [name]. is a 77 y.o. male who  has a past medical history of Acute encephalopathy (12/28/2015), Allergy, Anxiety, Asthma, Benign prostatic hypertrophy, CAD (coronary artery disease), Chronic kidney disease, COPD (chronic obstructive pulmonary disease) ([place]), DDD (degenerative disc disease), lumbar, Dementia, Depression, Diabetes mellitus, Diverticulitis, colon, Emphysema of lung ([place]), Esophageal stricture, GERD (gastroesophageal reflux disease), Herniated disc, Hiatal hernia, History of colonic polyps, HOH (hard of hearing), colonoscopy, Hyperlipidemia, Hypertension, Insomnia, Iron deficiency anemia, Lumbar back pain, Memory loss, OA (osteoarthritis), OSA (obstructive sleep apnea), Pneumonia, PVD (peripheral vascular disease) ([place]), Shortness of breath dyspnea, and Spinal stenosis.  he presents to [facility] today, 10/21/17,  for chief complaint of:  COPD follow-up  Patient seen a few months ago, recurrent COPD exacerbations and shortness of breath.  Currently under the care of pulmonology.  Patient states that overall he is doing really well, breathing is a little bit better, coughing is a little bit better.  Has no complaints today, he is just keeping his appointment.  Prediabetes: A1C 6.1%.  He was previously on medications but these were discontinued since A1c was pretty good, given age and other medical comorbidities he the goal of getting off of some of his medicines.  Patient is accompanied by wife who assists with history-taking.   Past medical history, surgical history, and family history reviewed.  Current medication list and allergy/intolerance information reviewed.   (See remainder of HPI, ROS, Phys Exam below)    Results for orders placed or performed in visit on 10/21/17 (from the past 72 hour(s))  POCT HgB A1C     Status: Abnormal   Collection Time: 10/21/17  3:13 PM  Result Value Ref Range   Hemoglobin A1C 6.1 (A) 4.0 - 5.6  %   HbA1c POC (<> result, manual entry)  4.0 - 5.6 %   HbA1c, POC (prediabetic range)  5.7 - 6.4 %   HbA1c, POC (controlled diabetic range)  0.0 - 7.0 %     ASSESSMENT/PLAN:   Diabetes mellitus without complication (HCC) - Diet controlled diabetes, sugars in prediabetic range.  Can follow every 4 to 6 months A1c - Plan: POCT HgB A1C  COPD, severe ([place]) - Doing well under care of pulmonology, lungs sound a bit better on exam than they usually do, this is encouraging.    Patient Instructions  Follow up with Dr Guerry Bruin as directed  Any problems come see me!    Follow-up plan: Return in about 3 months (around 01/21/2018) for [place] (FASTING) - sooner if needed! .     ############################################ ############################################ ############################################ ############################################    Outpatient Encounter Medications as of 10/21/2017  Medication Sig  . albuterol (PROVENTIL HFA;VENTOLIN HFA) 108 (90 Base) MCG/ACT inhaler Inhale 1-2 puffs into the lungs every 4 (four) hours as needed for wheezing or shortness of breath.  . benzonatate (TESSALON) 200 MG capsule Take 1 capsule (200 mg total) by mouth 3 (three) times daily as needed for cough.  . donepezil (ARICEPT) 10 MG tablet Take 1 tablet (10 mg total) by mouth at bedtime.  . DULoxetine (CYMBALTA) 60 MG capsule Take 1 capsule by mouth daily.  . ferrous sulfate 325 (65 FE) MG EC tablet Take 1 tablet (325 mg total) by mouth daily with breakfast. (Patient not taking: Reported on 07/24/2017)  . fluticasone (FLONASE) 50 MCG/ACT nasal spray Place 2 sprays into both nostrils daily.  Marland Kitchen HYDROcodone-acetaminophen (NORCO) 10-325 MG tablet Take 1 tablet by mouth  every 4 (four) hours as needed.  Marland Kitchen ipratropium (ATROVENT) 0.06 % nasal spray Place 2 sprays into both nostrils 4 (four) times daily.  Marland Kitchen ipratropium-albuterol (DUONEB) 0.5-2.5 (3) MG/3ML SOLN  Take 3 mLs by nebulization 2 (two) times daily. And every 4 hours as needed for shortness of breath (Patient not taking: Reported on 07/24/2017)  . ketoconazole (NIZORAL) 2 % shampoo Apply 1 application topically 2 (two) times a week.  . lamoTRIgine (LAMICTAL) 100 MG tablet Take 50-100 mg by mouth 2 (two) times daily. 35m in the morning, and 1063min the evening  . loratadine (CLARITIN) 10 MG tablet Take 1 tablet (10 mg total) by mouth daily.  . Multiple Vitamin (MULTIVITAMIN) tablet Take 1 tablet by mouth daily.  . Nebulizers MISC Please dispense nebulizer device per patient's insurance coverage. Sig: use with nebulized medications as directed. Dx: J96.11 (Patient not taking: Reported on 07/24/2017)  . nitroGLYCERIN (NITROSTAT) 0.4 MG SL tablet Place 1 tablet (0.4 mg total) every 5 (five) minutes as needed under the tongue. For chest pain  . omeprazole (PRILOSEC) 20 MG capsule Take 1 capsule (20 mg total) by mouth 2 (two) times daily before a meal.  . OXYGEN Inhale 2.5 L into the lungs at bedtime.  . pravastatin (PRAVACHOL) 40 MG tablet Take 1 tablet (40 mg total) by mouth daily.  . Marland Kitchenespiratory Therapy Supplies (NEBULIZER/TUBING/MOUTHPIECE) KIT Please dispense all necessary nebulizer supplies per patient's insurance coverage  . traMADol (ULTRAM) 50 MG tablet Take 1 tablet (50 mg total) by mouth every 8 (eight) hours as needed for moderate pain. Maximum 6 tabs per day.  . umeclidinium-vilanterol (ANORO ELLIPTA) 62.5-25 MCG/INH AEPB Inhale 1 puff into the lungs daily.  . Marland Kitchenolpidem (AMBIEN) 10 MG tablet Take 0.5 tablets (5 mg total) by mouth at bedtime as needed for sleep.   No facility-administered encounter medications on file as of 10/21/2017.    Allergies  Allergen Reactions  . Morphine Itching    REACTION: itching  . Celecoxib Nausea And Vomiting    REACTION: GI upset  . Chicken Protein Other (See Comments)    Will not eat chicken  . Fish Allergy Other (See Comments)    Patient will not  eat, does NOT like  . Nalbuphine Other (See Comments)    REACTION: contraindication with oxycontin.  . Prednisone Other (See Comments)    REACTION: "yeast infections" Yeast infection.  . Venlafaxine Other (See Comments)    REACTION: GI upset  . Chicken Allergy Other (See Comments)    Will not eat chicken  . Fish-Derived Products Other (See Comments)    Will not eat seafood of any kind      Review of Systems:  Constitutional: No recent illness  HEENT: No  headache, no vision change  Cardiac: No  chest pain, No  pressure, No palpitations  Respiratory:  +shortness of breath. +Cough -stable chronic issues, a bit improved from previous baseline   gastrointestinal: No  abdominal pain,  Musculoskeletal: No new myalgia/arthralgia  Neurologic: No  weakness, No  Dizziness  Psychiatric: No  concerns with depression, No  concerns with anxiety  Exam:  BP 114/75 (BP Location: Left Arm, Patient Position: Sitting, Cuff Size: Small)   Pulse 93   Wt 141 lb 12.8 oz (64.3 kg)   BMI 22.90 kg/m   Constitutional: VS see above. General Appearance: alert, well-developed, well-nourished, NAD  Eyes: Normal lids and conjunctive, non-icteric sclera  Ears, Nose, Mouth, Throat: MMM, Normal external inspection ears/nares/mouth/lips/gums.  Neck: No masses, trachea  midline.   Respiratory: Normal respiratory effort. no wheeze, no rhonchi, no rales, minimal coarse breath sounds in definite decreased breath sounds in all lung fields  Cardiovascular: S1/S2 normal, no murmur, no rub/gallop auscultated. RRR.   Musculoskeletal: Gait normal. Symmetric and independent movement of all extremities  Neurological: Normal balance/coordination. No tremor.  Skin: warm, dry, intact.   Psychiatric: Normal judgment/insight. Normal mood and affect. Oriented x3.   Visit summary with medication list and pertinent instructions was printed for patient to review, advised to alert [place] if any changes needed. All  questions at time of visit were answered - patient instructed to contact office with any additional concerns. ER/RTC precautions were reviewed with the patient and understanding verbalized.   Follow-up plan: Return in about 3 months (around 01/21/2018) for [place] (FASTING) - sooner if needed! .  Note: Total time spent 15 minutes, greater than 50% of the visit was spent face-to-face counseling and coordinating care for the following: The primary encounter diagnosis was Diabetes mellitus without complication ([place]). A diagnosis of COPD, severe ([place]) was also pertinent to this visit.Marland Kitchen  Please note: voice recognition software was used to produce this document, and typos may escape review. Please contact Dr. Sheppard Coil for any needed clarifications.

## 2017-10-21 NOTE — Patient Instructions (Signed)
Follow up with Dr Guerry Bruin as directed  Any problems come see me!

## 2017-10-22 ENCOUNTER — Encounter: Payer: Self-pay | Admitting: Osteopathic Medicine

## 2017-10-23 ENCOUNTER — Ambulatory Visit: Payer: Medicare HMO | Admitting: Osteopathic Medicine

## 2017-11-03 DIAGNOSIS — M19019 Primary osteoarthritis, unspecified shoulder: Secondary | ICD-10-CM | POA: Diagnosis not present

## 2017-11-11 DIAGNOSIS — M19019 Primary osteoarthritis, unspecified shoulder: Secondary | ICD-10-CM | POA: Diagnosis not present

## 2017-11-11 DIAGNOSIS — M47816 Spondylosis without myelopathy or radiculopathy, lumbar region: Secondary | ICD-10-CM | POA: Diagnosis not present

## 2017-11-11 DIAGNOSIS — M5136 Other intervertebral disc degeneration, lumbar region: Secondary | ICD-10-CM | POA: Diagnosis not present

## 2017-11-11 DIAGNOSIS — G894 Chronic pain syndrome: Secondary | ICD-10-CM | POA: Diagnosis not present

## 2017-11-17 DIAGNOSIS — J209 Acute bronchitis, unspecified: Secondary | ICD-10-CM | POA: Diagnosis not present

## 2017-11-17 DIAGNOSIS — R0902 Hypoxemia: Secondary | ICD-10-CM | POA: Diagnosis not present

## 2017-11-17 DIAGNOSIS — J438 Other emphysema: Secondary | ICD-10-CM | POA: Diagnosis not present

## 2017-11-17 DIAGNOSIS — J449 Chronic obstructive pulmonary disease, unspecified: Secondary | ICD-10-CM | POA: Diagnosis not present

## 2017-11-17 DIAGNOSIS — I10 Essential (primary) hypertension: Secondary | ICD-10-CM | POA: Diagnosis not present

## 2017-11-17 DIAGNOSIS — E139 Other specified diabetes mellitus without complications: Secondary | ICD-10-CM | POA: Diagnosis not present

## 2017-11-17 DIAGNOSIS — R0602 Shortness of breath: Secondary | ICD-10-CM | POA: Diagnosis not present

## 2017-11-17 DIAGNOSIS — Z9181 History of falling: Secondary | ICD-10-CM | POA: Diagnosis not present

## 2017-11-17 DIAGNOSIS — K219 Gastro-esophageal reflux disease without esophagitis: Secondary | ICD-10-CM | POA: Diagnosis not present

## 2017-11-17 DIAGNOSIS — I2510 Atherosclerotic heart disease of native coronary artery without angina pectoris: Secondary | ICD-10-CM | POA: Diagnosis not present

## 2017-11-24 ENCOUNTER — Other Ambulatory Visit: Payer: Self-pay | Admitting: Osteopathic Medicine

## 2017-11-24 DIAGNOSIS — L219 Seborrheic dermatitis, unspecified: Secondary | ICD-10-CM

## 2017-12-10 DIAGNOSIS — G894 Chronic pain syndrome: Secondary | ICD-10-CM | POA: Diagnosis not present

## 2017-12-10 DIAGNOSIS — Z79891 Long term (current) use of opiate analgesic: Secondary | ICD-10-CM | POA: Diagnosis not present

## 2017-12-10 DIAGNOSIS — M19019 Primary osteoarthritis, unspecified shoulder: Secondary | ICD-10-CM | POA: Diagnosis not present

## 2017-12-10 DIAGNOSIS — M48061 Spinal stenosis, lumbar region without neurogenic claudication: Secondary | ICD-10-CM | POA: Diagnosis not present

## 2017-12-10 DIAGNOSIS — Z79899 Other long term (current) drug therapy: Secondary | ICD-10-CM | POA: Diagnosis not present

## 2017-12-10 DIAGNOSIS — M47816 Spondylosis without myelopathy or radiculopathy, lumbar region: Secondary | ICD-10-CM | POA: Diagnosis not present

## 2017-12-12 ENCOUNTER — Other Ambulatory Visit: Payer: Self-pay | Admitting: Osteopathic Medicine

## 2017-12-18 DIAGNOSIS — J449 Chronic obstructive pulmonary disease, unspecified: Secondary | ICD-10-CM | POA: Diagnosis not present

## 2017-12-18 DIAGNOSIS — R0602 Shortness of breath: Secondary | ICD-10-CM | POA: Diagnosis not present

## 2017-12-18 DIAGNOSIS — J438 Other emphysema: Secondary | ICD-10-CM | POA: Diagnosis not present

## 2017-12-18 DIAGNOSIS — Z9181 History of falling: Secondary | ICD-10-CM | POA: Diagnosis not present

## 2017-12-18 DIAGNOSIS — J209 Acute bronchitis, unspecified: Secondary | ICD-10-CM | POA: Diagnosis not present

## 2017-12-18 DIAGNOSIS — E139 Other specified diabetes mellitus without complications: Secondary | ICD-10-CM | POA: Diagnosis not present

## 2017-12-18 DIAGNOSIS — I10 Essential (primary) hypertension: Secondary | ICD-10-CM | POA: Diagnosis not present

## 2017-12-18 DIAGNOSIS — I2510 Atherosclerotic heart disease of native coronary artery without angina pectoris: Secondary | ICD-10-CM | POA: Diagnosis not present

## 2017-12-18 DIAGNOSIS — K219 Gastro-esophageal reflux disease without esophagitis: Secondary | ICD-10-CM | POA: Diagnosis not present

## 2017-12-18 DIAGNOSIS — R0902 Hypoxemia: Secondary | ICD-10-CM | POA: Diagnosis not present

## 2017-12-24 DIAGNOSIS — R69 Illness, unspecified: Secondary | ICD-10-CM | POA: Diagnosis not present

## 2017-12-24 DIAGNOSIS — F0281 Dementia in other diseases classified elsewhere with behavioral disturbance: Secondary | ICD-10-CM | POA: Diagnosis not present

## 2017-12-24 DIAGNOSIS — F330 Major depressive disorder, recurrent, mild: Secondary | ICD-10-CM | POA: Diagnosis not present

## 2017-12-25 ENCOUNTER — Other Ambulatory Visit: Payer: Self-pay | Admitting: Osteopathic Medicine

## 2017-12-25 DIAGNOSIS — J449 Chronic obstructive pulmonary disease, unspecified: Secondary | ICD-10-CM

## 2017-12-26 DIAGNOSIS — R69 Illness, unspecified: Secondary | ICD-10-CM | POA: Diagnosis not present

## 2018-01-06 DIAGNOSIS — M19019 Primary osteoarthritis, unspecified shoulder: Secondary | ICD-10-CM | POA: Diagnosis not present

## 2018-01-06 DIAGNOSIS — M47816 Spondylosis without myelopathy or radiculopathy, lumbar region: Secondary | ICD-10-CM | POA: Diagnosis not present

## 2018-01-06 DIAGNOSIS — M48061 Spinal stenosis, lumbar region without neurogenic claudication: Secondary | ICD-10-CM | POA: Diagnosis not present

## 2018-01-06 DIAGNOSIS — G894 Chronic pain syndrome: Secondary | ICD-10-CM | POA: Diagnosis not present

## 2018-01-17 DIAGNOSIS — R0602 Shortness of breath: Secondary | ICD-10-CM | POA: Diagnosis not present

## 2018-01-17 DIAGNOSIS — K219 Gastro-esophageal reflux disease without esophagitis: Secondary | ICD-10-CM | POA: Diagnosis not present

## 2018-01-17 DIAGNOSIS — R0902 Hypoxemia: Secondary | ICD-10-CM | POA: Diagnosis not present

## 2018-01-17 DIAGNOSIS — I10 Essential (primary) hypertension: Secondary | ICD-10-CM | POA: Diagnosis not present

## 2018-01-17 DIAGNOSIS — E139 Other specified diabetes mellitus without complications: Secondary | ICD-10-CM | POA: Diagnosis not present

## 2018-01-17 DIAGNOSIS — J438 Other emphysema: Secondary | ICD-10-CM | POA: Diagnosis not present

## 2018-01-17 DIAGNOSIS — Z9181 History of falling: Secondary | ICD-10-CM | POA: Diagnosis not present

## 2018-01-17 DIAGNOSIS — I2510 Atherosclerotic heart disease of native coronary artery without angina pectoris: Secondary | ICD-10-CM | POA: Diagnosis not present

## 2018-01-17 DIAGNOSIS — J449 Chronic obstructive pulmonary disease, unspecified: Secondary | ICD-10-CM | POA: Diagnosis not present

## 2018-01-17 DIAGNOSIS — J209 Acute bronchitis, unspecified: Secondary | ICD-10-CM | POA: Diagnosis not present

## 2018-01-21 ENCOUNTER — Encounter: Payer: Medicare HMO | Admitting: Osteopathic Medicine

## 2018-01-28 DIAGNOSIS — R69 Illness, unspecified: Secondary | ICD-10-CM | POA: Diagnosis not present

## 2018-01-28 DIAGNOSIS — F330 Major depressive disorder, recurrent, mild: Secondary | ICD-10-CM | POA: Diagnosis not present

## 2018-01-28 DIAGNOSIS — F0281 Dementia in other diseases classified elsewhere with behavioral disturbance: Secondary | ICD-10-CM | POA: Diagnosis not present

## 2018-02-02 ENCOUNTER — Other Ambulatory Visit: Payer: Self-pay | Admitting: Osteopathic Medicine

## 2018-02-04 ENCOUNTER — Encounter: Payer: Self-pay | Admitting: Cardiovascular Disease

## 2018-02-04 DIAGNOSIS — Z79899 Other long term (current) drug therapy: Secondary | ICD-10-CM | POA: Diagnosis not present

## 2018-02-04 DIAGNOSIS — Z79891 Long term (current) use of opiate analgesic: Secondary | ICD-10-CM | POA: Diagnosis not present

## 2018-02-04 DIAGNOSIS — M19019 Primary osteoarthritis, unspecified shoulder: Secondary | ICD-10-CM | POA: Diagnosis not present

## 2018-02-04 DIAGNOSIS — G894 Chronic pain syndrome: Secondary | ICD-10-CM | POA: Diagnosis not present

## 2018-02-17 DIAGNOSIS — I2510 Atherosclerotic heart disease of native coronary artery without angina pectoris: Secondary | ICD-10-CM | POA: Diagnosis not present

## 2018-02-17 DIAGNOSIS — J449 Chronic obstructive pulmonary disease, unspecified: Secondary | ICD-10-CM | POA: Diagnosis not present

## 2018-02-17 DIAGNOSIS — K219 Gastro-esophageal reflux disease without esophagitis: Secondary | ICD-10-CM | POA: Diagnosis not present

## 2018-02-17 DIAGNOSIS — Z9181 History of falling: Secondary | ICD-10-CM | POA: Diagnosis not present

## 2018-02-17 DIAGNOSIS — R0902 Hypoxemia: Secondary | ICD-10-CM | POA: Diagnosis not present

## 2018-02-17 DIAGNOSIS — I10 Essential (primary) hypertension: Secondary | ICD-10-CM | POA: Diagnosis not present

## 2018-02-17 DIAGNOSIS — R0602 Shortness of breath: Secondary | ICD-10-CM | POA: Diagnosis not present

## 2018-02-17 DIAGNOSIS — J209 Acute bronchitis, unspecified: Secondary | ICD-10-CM | POA: Diagnosis not present

## 2018-02-17 DIAGNOSIS — J438 Other emphysema: Secondary | ICD-10-CM | POA: Diagnosis not present

## 2018-02-17 DIAGNOSIS — E139 Other specified diabetes mellitus without complications: Secondary | ICD-10-CM | POA: Diagnosis not present

## 2018-02-27 ENCOUNTER — Encounter: Payer: Self-pay | Admitting: Cardiovascular Disease

## 2018-02-27 ENCOUNTER — Ambulatory Visit: Payer: Medicare HMO | Admitting: Cardiovascular Disease

## 2018-02-27 VITALS — BP 118/76 | HR 84 | Ht 65.0 in | Wt 153.4 lb

## 2018-02-27 DIAGNOSIS — E7800 Pure hypercholesterolemia, unspecified: Secondary | ICD-10-CM | POA: Diagnosis not present

## 2018-02-27 DIAGNOSIS — Z720 Tobacco use: Secondary | ICD-10-CM

## 2018-02-27 DIAGNOSIS — I10 Essential (primary) hypertension: Secondary | ICD-10-CM

## 2018-02-27 DIAGNOSIS — I2510 Atherosclerotic heart disease of native coronary artery without angina pectoris: Secondary | ICD-10-CM | POA: Diagnosis not present

## 2018-02-27 NOTE — Progress Notes (Signed)
Chief Complaint  Patient presents with  . Follow-up    CAD   History of Present Illness: 77 yo male with history of CAD s/p CABG 1999, HTN, HLD, severe COPD, ongoing tobacco abuse, DM, GERD and PAD here today for follow up. I met him in July 2015. Last cath in August 2005 demonstrated mild LAD disease, patent large OM branch with occluded distal Circumflex filling by vein graft, occluded RCA with occluded vein graft to RCA, RCA filling from left to right collaterals. He has been managed medically since that time. He continues to smoke. When I met him in July 2015, he c/o chest pain. He cancelled a stress test in 2015. Nuclear stress test August 2017 with no ischemia. Echo 12/28/15 with LVEF=60-65%, no valve disease.   He is here today for follow up. The patient denies any chest pain, palpitations, lower extremity edema, orthopnea, PND, dizziness, near syncope or syncope. He has baseline dyspnea. No change. No pain in legs. He has chronic back pain. Still smoking. Does not wish to stop.   Primary Care Physician: [name], DO  Past Medical History:  Diagnosis Date  . Acute encephalopathy 12/28/2015  . Allergy   . Anxiety   . Asthma   . Benign prostatic hypertrophy   . CAD (coronary artery disease)    a. s/p multiple PCIs to RCA and eventual CABG in 1999;  b.LHC 11/2003: CFX 95%, RCA occluded, SVG-RCA occluded, SVG-OM patent, native LAD patent. EF was 55%.  Patient had left to right collaterals and medical therapy was recommended    . Chronic kidney disease   . COPD (chronic obstructive pulmonary disease) ([place])   . DDD (degenerative disc disease), lumbar   . Dementia ([place])   . Depression   . Diabetes mellitus    on no medications  . Diverticulitis, colon   . Emphysema of lung ([place])   . Esophageal stricture   . GERD (gastroesophageal reflux disease)   . Herniated disc   . Hiatal hernia   . History of colonic polyps   . HOH (hard of hearing)   . Hx of colonoscopy   .  Hyperlipidemia   . Hypertension   . Insomnia   . Iron deficiency anemia   . Lumbar back pain    chronic  . Memory loss   . OA (osteoarthritis)   . OSA (obstructive sleep apnea)    wife denies this  . Pneumonia   . PVD (peripheral vascular disease) ([place])   . Shortness of breath dyspnea    on home O2  . Spinal stenosis     Past Surgical History:  Procedure Laterality Date  . bilateral knee replacements    . CARPAL TUNNEL RELEASE    . CIRCUMCISION    . COLONOSCOPY N/A 10/13/2015   Procedure: COLONOSCOPY;  Surgeon: Irene Shipper, MD;  Location: [facility] ENDOSCOPY;  Service: Endoscopy;  Laterality: N/A;  . CORONARY ANGIOPLASTY WITH STENT PLACEMENT  2/99  . CORONARY ARTERY BYPASS GRAFT  09/1997   double bypss  . EYE SURGERY  06/2011   " eye muscle   . heart bypass    . HEMORRHOID SURGERY    . SHOULDER ARTHROSCOPY  right  . STRABISMUS SURGERY  08/29/2011   Procedure: REPAIR STRABISMUS;  Surgeon: Derry Skill, MD;  Location: [facility];  Service: Ophthalmology;  Laterality: Left;    Current Outpatient Medications  Medication Sig Dispense Refill  . albuterol (PROVENTIL HFA;VENTOLIN HFA) 108 (90 Base) MCG/ACT inhaler  Inhale 1-2 puffs into the lungs every 4 (four) hours as needed for wheezing or shortness of breath. 2 Inhaler 12  . benzonatate (TESSALON) 200 MG capsule TAKE 1 CAPSULE BY MOUTH 3 TIMES DAILY AS NEEDED FOR COUGH. 90 capsule 3  . donepezil (ARICEPT) 10 MG tablet TAKE 1 TABLET BY MOUTH AT BEDTIME. 90 tablet 3  . DULoxetine (CYMBALTA) 60 MG capsule Take 1 capsule by mouth daily.    . fluticasone (FLONASE) 50 MCG/ACT nasal spray Place 2 sprays into both nostrils daily. 48 g 5  . HYDROcodone-acetaminophen (NORCO) 10-325 MG tablet Take 1 tablet by mouth every 4 (four) hours as needed.    Marland Kitchen ipratropium (ATROVENT) 0.06 % nasal spray Place 2 sprays into both nostrils 4 (four) times daily. 15 mL 1  . ketoconazole (NIZORAL) 2 % shampoo APPLY TOPICALLY TWICE WEEKLY 120 mL 1  . lamoTRIgine  (LAMICTAL) 100 MG tablet Take 50-100 mg by mouth 2 (two) times daily. 26m in the morning, and 1028min the evening    . loratadine (CLARITIN) 10 MG tablet Take 1 tablet (10 mg total) by mouth daily. 30 tablet 11  . Multiple Vitamin (MULTIVITAMIN) tablet Take 1 tablet by mouth daily.    . nitroGLYCERIN (NITROSTAT) 0.4 MG SL tablet Place 1 tablet (0.4 mg total) every 5 (five) minutes as needed under the tongue. For chest pain 25 tablet 6  . omeprazole (PRILOSEC) 20 MG capsule TAKE 1 CAPSULE BY MOUTH 2 TIMES DAILY BEFORE A MEAL. 60 capsule 11  . OXYGEN Inhale 2.5 L into the lungs at bedtime.    . pravastatin (PRAVACHOL) 40 MG tablet TAKE 1 TABLET BY MOUTH DAILY. 90 tablet 3  . TRELEGY ELLIPTA 100-62.5-25 MCG/INH AEPB SHAKE WELL AND INHALE 1 PUFF INTO THE LUNGS DAILY  11  . zolpidem (AMBIEN) 10 MG tablet Take 0.5 tablets (5 mg total) by mouth at bedtime as needed for sleep.     No current facility-administered medications for this visit.     Allergies  Allergen Reactions  . Morphine Itching    REACTION: itching  . Celecoxib Nausea And Vomiting    REACTION: GI upset  . Chicken Protein Other (See Comments)    Will not eat chicken  . Fish Allergy Other (See Comments)    Patient will not eat, does NOT like  . Nalbuphine Other (See Comments)    REACTION: contraindication with oxycontin.  . Prednisone Other (See Comments)    REACTION: "yeast infections" Yeast infection.  . Venlafaxine Other (See Comments)    REACTION: GI upset  . Chicken Allergy Other (See Comments)    Will not eat chicken  . Fish-Derived Products Other (See Comments)    Will not eat seafood of any kind    Social History   Socioeconomic History  . Marital status: Married    Spouse name: Not on file  . Number of children: 5  . Years of education: Not on file  . Highest education level: Not on file  Occupational History  . Occupation: Retired-construction  Social Needs  . Financial resource strain: Not on file  .  Food insecurity:    Worry: Not on file    Inability: Not on file  . Transportation needs:    Medical: Not on file    Non-medical: Not on file  Tobacco Use  . Smoking status: Current Every Day Smoker    Packs/day: 1.00    Years: 15.00    Pack years: 15.00    Types: Cigarettes  Start date: 02/14/1948  . Smokeless tobacco: Never Used  . Tobacco comment: Peak rate of 3ppd  Substance and Sexual Activity  . Alcohol use: No    Alcohol/week: 0.0 standard drinks    Comment: none for 20 years   . Drug use: No  . Sexual activity: Not on file  Lifestyle  . Physical activity:    Days per week: Not on file    Minutes per session: Not on file  . Stress: Not on file  Relationships  . Social connections:    Talks on phone: Not on file    Gets together: Not on file    Attends religious service: Not on file    Active member of club or organization: Not on file    Attends meetings of clubs or organizations: Not on file    Relationship status: Not on file  . Intimate partner violence:    Fear of current or ex partner: Not on file    Emotionally abused: Not on file    Physically abused: Not on file    Forced sexual activity: Not on file  Other Topics Concern  . Not on file  Social History Narrative   Originally from [place]. Previously lived in [place]. Previously worked in a [employer]. 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 Does have asbestos exposure. No known mold or bird exposure.     Family History  Problem Relation Age of Onset  . Stroke Father   . Emphysema Father   . Heart disease Unknown   . Hypertension Unknown   . Hyperlipidemia Unknown   . Allergies Unknown   . Diabetes Unknown   . Kidney cancer Daughter   . Breast cancer Daughter   . Colon cancer Neg Hx     Review of Systems:  As stated in the HPI and otherwise negative.   BP 118/76   Pulse 84   Ht _0   (1.651 m)   Wt 153 lb 6.4 oz (69.6 kg)   SpO2 95%   BMI 25.53 kg/m   Physical Examination:  General: Well developed, well nourished, NAD  HEENT: OP clear, mucus membranes moist  SKIN: warm, dry. No rashes. Neuro: No focal deficits  Musculoskeletal: Muscle strength 5/5 all ext  Psychiatric: Mood and affect normal  Neck: No JVD, no carotid bruits, no thyromegaly, no lymphadenopathy.  Lungs:diffuse wheezes, poor air movement bilaterally  Cardiovascular: Regular rate and rhythm. No murmurs, gallops or rubs. Abdomen:Soft. Bowel sounds present. Non-tender.  Extremities: No lower extremity edema.   Cardiac cath August 2005:  The main left coronary was normal.  The left anterior descending artery had some minimal irregularities, coursed  to the apex of the heart where it bifurcated. It was essentially widely  patent with no significant stenosis and good flow throughout. There was  some bend at the junction of the mid portion and the junction of the  distal third of the RCA possibly representing bridging. However, there was  no systolic compromise present and there was good flow throughout.  There was bifurcating moderate size first diagonal before SP1 in the  proximal LAD that was normal. There was a small to moderate size DX2 at the  junction of the proximal third that was normal and there was a small DX3  from the mid LAD that was normal.  The circumflex artery gave off  a moderate size long OM-1 that had no  significant stenosis, arose very proximally. The moderate size OM-2 had no  significant stenosis just beyond this and bifurcated. There was 50% smooth  narrowing just beyond this and before the PABG branch. The PABG branch  provided grade 3 collaterals to the distal RCA. There were also collaterals  from the distal LAD to the distal RCA. The PDA and PLA were visualized up  to their distal origin.  The circumflex artery beyond the PABG branch had 95% segmental stenosis  before  the insertion of the graft which was seen on retrograde filling.  The right coronary was totally occluded in its proximal third just before  the previously placed tandem proximal and mid RCA stents. There was no  antegrade flow.  Saphenous vein graft to the RCA was totally occluded in its proximal portion  with a bullet shaped configuration.  The saphenous vein graft to the obtuse marginal was widely patent. There  was a large valve in the proximal third, but there was excellent flow and an  excellent anastomosis to the distal marginal branch. It filled the distal  marginal branches and its branches well antegrade.  EKG:  EKG is ordered today. The ekg ordered today demonstrates Sinusr rhythm, rate 84 bpm. 1st degree AV block.   Recent Labs: No results found for requested labs within last 8760 hours.   .Lipid Panel    Component Value Date/Time   CHOL 127 04/04/2015 1351   TRIG 190 (H) 04/04/2015 1351   HDL 31 (L) 04/04/2015 1351   CHOLHDL 4.1 04/04/2015 1351   VLDL 38 (H) 04/04/2015 1351   LDLCALC 58 04/04/2015 1351   LDLDIRECT 70.0 11/09/2014 1428     Wt Readings from Last 3 Encounters:  02/27/18 153 lb 6.4 oz (69.6 kg)  10/21/17 141 lb 12.8 oz (64.3 kg)  07/28/17 137 lb (62.1 kg)     Other studies Reviewed: Additional studies/ records that were reviewed today include: . Review of the above records demonstrates:    Assessment and Plan:   1. CAD without angina: He is s/p CABG in 1999 with last cath in 2005. Stress test in August 2017 with no ischemia. He has no chest pain. He has been on a statin but has chosen not to take daily ASA as he uses BC powders. He does not wish to start a beta blocker. He has not wished to stop smoking. Continue statin.   2. HTN: BP is normal. He is on no anti-hypertensive medications.   3. HLD: No recent check of lipids. Continue statin. Will need to check lipids and LFTs now. He will contact primary care to arrange this as he cancelled this  test a few weeks ago due to a death in the family.   4. Tobacco abuse, ongoing: Smoking cessation is advised.   Current medicines are reviewed at length with the patient today.  The patient does not have concerns regarding medicines.  The following changes have been made:  no change  Labs/ tests ordered today include:   Orders Placed This Encounter  Procedures  . EKG 12-Lead    Disposition:   FU with me in 12  months  Signed, Lauree Chandler, MD 02/27/2018 2:49 PM    [address], [place], [place]  [address] Phone: [phone]; Fax: [phone]

## 2018-02-27 NOTE — Patient Instructions (Signed)

## 2018-03-03 DIAGNOSIS — M47816 Spondylosis without myelopathy or radiculopathy, lumbar region: Secondary | ICD-10-CM | POA: Diagnosis not present

## 2018-03-03 DIAGNOSIS — G894 Chronic pain syndrome: Secondary | ICD-10-CM | POA: Diagnosis not present

## 2018-03-03 DIAGNOSIS — M48061 Spinal stenosis, lumbar region without neurogenic claudication: Secondary | ICD-10-CM | POA: Diagnosis not present

## 2018-03-03 DIAGNOSIS — M19019 Primary osteoarthritis, unspecified shoulder: Secondary | ICD-10-CM | POA: Diagnosis not present

## 2018-03-17 DIAGNOSIS — F330 Major depressive disorder, recurrent, mild: Secondary | ICD-10-CM | POA: Diagnosis not present

## 2018-03-17 DIAGNOSIS — F0281 Dementia in other diseases classified elsewhere with behavioral disturbance: Secondary | ICD-10-CM | POA: Diagnosis not present

## 2018-03-17 DIAGNOSIS — R69 Illness, unspecified: Secondary | ICD-10-CM | POA: Diagnosis not present

## 2018-03-18 ENCOUNTER — Ambulatory Visit (INDEPENDENT_AMBULATORY_CARE_PROVIDER_SITE_OTHER): Payer: Medicare HMO

## 2018-03-18 ENCOUNTER — Encounter: Payer: Self-pay | Admitting: Osteopathic Medicine

## 2018-03-18 ENCOUNTER — Ambulatory Visit (INDEPENDENT_AMBULATORY_CARE_PROVIDER_SITE_OTHER): Payer: Medicare HMO | Admitting: Osteopathic Medicine

## 2018-03-18 DIAGNOSIS — R05 Cough: Secondary | ICD-10-CM

## 2018-03-18 DIAGNOSIS — R059 Cough, unspecified: Secondary | ICD-10-CM

## 2018-03-18 DIAGNOSIS — J441 Chronic obstructive pulmonary disease with (acute) exacerbation: Secondary | ICD-10-CM | POA: Diagnosis not present

## 2018-03-18 DIAGNOSIS — J449 Chronic obstructive pulmonary disease, unspecified: Secondary | ICD-10-CM | POA: Diagnosis not present

## 2018-03-18 MED ORDER — PREDNISONE 50 MG PO TABS: ORAL_TABLET | ORAL | 0 refills | Status: DC

## 2018-03-18 NOTE — Progress Notes (Signed)
HPI: [name]. is a 77 y.o. male who  has a past medical history of Acute encephalopathy (12/28/2015), Allergy, Anxiety, Asthma, Benign prostatic hypertrophy, CAD (coronary artery disease), Chronic kidney disease, COPD (chronic obstructive pulmonary disease) ([place]), DDD (degenerative disc disease), lumbar, Dementia ([place]), Depression, Diabetes mellitus, Diverticulitis, colon, Emphysema of lung ([place]), Esophageal stricture, GERD (gastroesophageal reflux disease), Herniated disc, Hiatal hernia, History of colonic polyps, HOH (hard of hearing), colonoscopy, Hyperlipidemia, Hypertension, Insomnia, Iron deficiency anemia, Lumbar back pain, Memory loss, OA (osteoarthritis), OSA (obstructive sleep apnea), Pneumonia, PVD (peripheral vascular disease) ([place]), Shortness of breath dyspnea, and Spinal stenosis.  he presents to [facility] today, 03/18/18,  for chief complaint of: SOB/Cough  . Context: known COPD, on O2 . Location/Quality: chest congestion, SOB more than usual . Duration: couple weeks . Timing: constant, worse w/ activity    Patient is accompanied by wife who assists with history-taking.    Past medical, surgical, social and family history reviewed and updated as necessary.   Current medication list and allergy/intolerance information reviewed:    Current Outpatient Medications  Medication Sig Dispense Refill  . benzonatate (TESSALON) 200 MG capsule TAKE 1 CAPSULE BY MOUTH 3 TIMES DAILY AS NEEDED FOR COUGH. 90 capsule 3  . donepezil (ARICEPT) 10 MG tablet TAKE 1 TABLET BY MOUTH AT BEDTIME. 90 tablet 3  . DULoxetine (CYMBALTA) 60 MG capsule Take 1 capsule by mouth daily.    . fluticasone (FLONASE) 50 MCG/ACT nasal spray Place 2 sprays into both nostrils daily. 48 g 5  . HYDROcodone-acetaminophen (NORCO) 10-325 MG tablet Take 1 tablet by mouth every 4 (four) hours as needed.    Marland Kitchen ipratropium (ATROVENT) 0.06 % nasal spray Place 2 sprays into both  nostrils 4 (four) times daily. 15 mL 1  . ketoconazole (NIZORAL) 2 % shampoo APPLY TOPICALLY TWICE WEEKLY 120 mL 1  . lamoTRIgine (LAMICTAL) 100 MG tablet Take 50-100 mg by mouth 2 (two) times daily. 50mg  in the morning, and 100mg  in the evening    . loratadine (CLARITIN) 10 MG tablet Take 1 tablet (10 mg total) by mouth daily. 30 tablet 11  . Multiple Vitamin (MULTIVITAMIN) tablet Take 1 tablet by mouth daily.    . nitroGLYCERIN (NITROSTAT) 0.4 MG SL tablet Place 1 tablet (0.4 mg total) every 5 (five) minutes as needed under the tongue. For chest pain 25 tablet 6  . omeprazole (PRILOSEC) 20 MG capsule TAKE 1 CAPSULE BY MOUTH 2 TIMES DAILY BEFORE A MEAL. 60 capsule 11  . OXYGEN Inhale 2.5 L into the lungs at bedtime.    . pravastatin (PRAVACHOL) 40 MG tablet TAKE 1 TABLET BY MOUTH DAILY. 90 tablet 3  . TRELEGY ELLIPTA 100-62.5-25 MCG/INH AEPB SHAKE WELL AND INHALE 1 PUFF INTO THE LUNGS DAILY  11  . zolpidem (AMBIEN) 10 MG tablet Take 0.5 tablets (5 mg total) by mouth at bedtime as needed for sleep.    Marland Kitchen albuterol (PROVENTIL HFA;VENTOLIN HFA) 108 (90 Base) MCG/ACT inhaler Inhale 1-2 puffs into the lungs every 4 (four) hours as needed for wheezing or shortness of breath. 2 Inhaler 12   No current facility-administered medications for this visit.     Allergies  Allergen Reactions  . Morphine Itching    REACTION: itching  . Celecoxib Nausea And Vomiting    REACTION: GI upset  . Chicken Protein Other (See Comments)    Will not eat chicken  . Fish Allergy Other (See Comments)    Patient will  not eat, does NOT like  . Nalbuphine Other (See Comments)    REACTION: contraindication with oxycontin.  . Prednisone Other (See Comments)    REACTION: "yeast infections" Yeast infection.  . Venlafaxine Other (See Comments)    REACTION: GI upset  . Chicken Allergy Other (See Comments)    Will not eat chicken  . Fish-Derived Products Other (See Comments)    Will not eat seafood of any kind       Review of Systems:  Constitutional:  No  fever, no chills, No recent illness, No unintentional weight changes. No significant fatigue.   HEENT: No  headache, no vision change, no hearing change, No sore throat, No  sinus pressure  Cardiac: No  chest pain, No  pressure, No palpitations  Respiratory:  +shortness of breath. +Cough  Gastrointestinal: No  abdominal pain, No  nausea, No  vomiting,  No  blood in stool, No  diarrhea  Musculoskeletal: No new myalgia/arthralgia  Skin: No  Rash  Neurologic: No  weakness, No  dizziness  Exam:  BP 120/60 (BP Location: Left Arm, Patient Position: Sitting, Cuff Size: Normal)   Pulse (!) 45   Temp 97.7 F (36.5 C) (Oral)   Wt 149 lb 6.4 oz (67.8 kg)   BMI 24.86 kg/m   Constitutional: VS see above. General Appearance: alert, well-developed, well-nourished, NAD  Eyes: Normal lids and conjunctive, non-icteric sclera  Ears, Nose, Mouth, Throat: MMM, Normal external inspection ears/nares/mouth/lips/gums.  Neck: No masses, trachea midline. No tenderness/mass appreciated. No lymphadenopathy  Respiratory: Normal respiratory effort. Diffuse coarse breath sounds, no ronchi/rales   Cardiovascular: S1/S2 normal, no murmur, no rub/gallop auscultated. RRR. No lower extremity edema.   Musculoskeletal: Gait normal.   Skin: warm, dry, intact.   Psychiatric: Normal judgment/insight. Normal mood and affect.    CXR on personal review Cardiomediastinal silhouette/heart size: abnormal - a bit enlarged Obvious bony abnormality: arthritis spine, kyphosis Infiltrate: none Mass or other opacity: none Atelectasis: none Diaphragms: abnormal - flattened Lateral view: no infiltrate Pt counseled that radiologist will review the images as well, our office will call if the formal read reveals any significant findings other than what has been noted above.    No results found.   ASSESSMENT/PLAN: Diagnoses of Cough and COPD exacerbation ([place]) were  pertinent to this visit.   Has tessalon at home  abx if PNA per radiology over-read  Meds ordered this encounter  Medications  . predniSONE (DELTASONE) 50 MG tablet    Sig: One tab PO daily for 5 days.    Dispense:  5 tablet    Refill:  0    Orders Placed This Encounter  Procedures  . DG Chest 2 View    Patient Instructions  Take prednisone burst Use albuterol every 2-4 hours while sick Use Oxygen! If worse shortness of breath, or fever, please go to the hospital!       Visit summary with medication list and pertinent instructions was printed for patient to review. All questions at time of visit were answered - patient instructed to contact office with any additional concerns or updates. ER/RTC precautions were reviewed with the patient.   Follow-up plan: Return if symptoms worsen or fail to improve.     Please note: voice recognition software was used to produce this document, and typos may escape review. Please contact Dr. Sheppard Coil for any needed clarifications.

## 2018-03-18 NOTE — Patient Instructions (Signed)
Take prednisone burst Use albuterol every 2-4 hours while sick Use Oxygen! If worse shortness of breath, or fever, please go to the hospital!

## 2018-03-19 DIAGNOSIS — J449 Chronic obstructive pulmonary disease, unspecified: Secondary | ICD-10-CM | POA: Diagnosis not present

## 2018-03-19 DIAGNOSIS — J438 Other emphysema: Secondary | ICD-10-CM | POA: Diagnosis not present

## 2018-03-19 DIAGNOSIS — Z9181 History of falling: Secondary | ICD-10-CM | POA: Diagnosis not present

## 2018-03-19 DIAGNOSIS — K219 Gastro-esophageal reflux disease without esophagitis: Secondary | ICD-10-CM | POA: Diagnosis not present

## 2018-03-19 DIAGNOSIS — R0902 Hypoxemia: Secondary | ICD-10-CM | POA: Diagnosis not present

## 2018-03-19 DIAGNOSIS — R0602 Shortness of breath: Secondary | ICD-10-CM | POA: Diagnosis not present

## 2018-03-19 DIAGNOSIS — E139 Other specified diabetes mellitus without complications: Secondary | ICD-10-CM | POA: Diagnosis not present

## 2018-03-19 DIAGNOSIS — I10 Essential (primary) hypertension: Secondary | ICD-10-CM | POA: Diagnosis not present

## 2018-03-19 DIAGNOSIS — J209 Acute bronchitis, unspecified: Secondary | ICD-10-CM | POA: Diagnosis not present

## 2018-03-19 DIAGNOSIS — I2510 Atherosclerotic heart disease of native coronary artery without angina pectoris: Secondary | ICD-10-CM | POA: Diagnosis not present

## 2018-03-19 MED ORDER — AZITHROMYCIN 250 MG PO TABS: ORAL_TABLET | ORAL | 0 refills | Status: DC

## 2018-03-25 DIAGNOSIS — R918 Other nonspecific abnormal finding of lung field: Secondary | ICD-10-CM | POA: Diagnosis not present

## 2018-03-25 DIAGNOSIS — I252 Old myocardial infarction: Secondary | ICD-10-CM | POA: Diagnosis not present

## 2018-03-25 DIAGNOSIS — J449 Chronic obstructive pulmonary disease, unspecified: Secondary | ICD-10-CM | POA: Diagnosis not present

## 2018-03-25 DIAGNOSIS — E785 Hyperlipidemia, unspecified: Secondary | ICD-10-CM | POA: Diagnosis not present

## 2018-03-25 DIAGNOSIS — Z79899 Other long term (current) drug therapy: Secondary | ICD-10-CM | POA: Diagnosis not present

## 2018-03-25 DIAGNOSIS — R69 Illness, unspecified: Secondary | ICD-10-CM | POA: Diagnosis not present

## 2018-03-25 DIAGNOSIS — K219 Gastro-esophageal reflux disease without esophagitis: Secondary | ICD-10-CM | POA: Diagnosis not present

## 2018-04-03 DIAGNOSIS — M5136 Other intervertebral disc degeneration, lumbar region: Secondary | ICD-10-CM | POA: Diagnosis not present

## 2018-04-03 DIAGNOSIS — M48061 Spinal stenosis, lumbar region without neurogenic claudication: Secondary | ICD-10-CM | POA: Diagnosis not present

## 2018-04-03 DIAGNOSIS — G894 Chronic pain syndrome: Secondary | ICD-10-CM | POA: Diagnosis not present

## 2018-04-03 DIAGNOSIS — M47816 Spondylosis without myelopathy or radiculopathy, lumbar region: Secondary | ICD-10-CM | POA: Diagnosis not present

## 2018-04-09 DIAGNOSIS — J449 Chronic obstructive pulmonary disease, unspecified: Secondary | ICD-10-CM | POA: Diagnosis not present

## 2018-04-09 DIAGNOSIS — R918 Other nonspecific abnormal finding of lung field: Secondary | ICD-10-CM | POA: Diagnosis not present

## 2018-04-19 DIAGNOSIS — R0602 Shortness of breath: Secondary | ICD-10-CM | POA: Diagnosis not present

## 2018-04-19 DIAGNOSIS — K219 Gastro-esophageal reflux disease without esophagitis: Secondary | ICD-10-CM | POA: Diagnosis not present

## 2018-04-19 DIAGNOSIS — I2510 Atherosclerotic heart disease of native coronary artery without angina pectoris: Secondary | ICD-10-CM | POA: Diagnosis not present

## 2018-04-19 DIAGNOSIS — Z9181 History of falling: Secondary | ICD-10-CM | POA: Diagnosis not present

## 2018-04-19 DIAGNOSIS — J449 Chronic obstructive pulmonary disease, unspecified: Secondary | ICD-10-CM | POA: Diagnosis not present

## 2018-04-19 DIAGNOSIS — J209 Acute bronchitis, unspecified: Secondary | ICD-10-CM | POA: Diagnosis not present

## 2018-04-19 DIAGNOSIS — J438 Other emphysema: Secondary | ICD-10-CM | POA: Diagnosis not present

## 2018-04-19 DIAGNOSIS — R0902 Hypoxemia: Secondary | ICD-10-CM | POA: Diagnosis not present

## 2018-04-19 DIAGNOSIS — E139 Other specified diabetes mellitus without complications: Secondary | ICD-10-CM | POA: Diagnosis not present

## 2018-04-19 DIAGNOSIS — I10 Essential (primary) hypertension: Secondary | ICD-10-CM | POA: Diagnosis not present

## 2018-04-20 DIAGNOSIS — F0281 Dementia in other diseases classified elsewhere with behavioral disturbance: Secondary | ICD-10-CM | POA: Diagnosis not present

## 2018-04-20 DIAGNOSIS — R69 Illness, unspecified: Secondary | ICD-10-CM | POA: Diagnosis not present

## 2018-04-20 DIAGNOSIS — F330 Major depressive disorder, recurrent, mild: Secondary | ICD-10-CM | POA: Diagnosis not present

## 2018-04-28 DIAGNOSIS — M25552 Pain in left hip: Secondary | ICD-10-CM | POA: Diagnosis not present

## 2018-04-28 DIAGNOSIS — M8580 Other specified disorders of bone density and structure, unspecified site: Secondary | ICD-10-CM | POA: Diagnosis not present

## 2018-04-28 DIAGNOSIS — M47816 Spondylosis without myelopathy or radiculopathy, lumbar region: Secondary | ICD-10-CM | POA: Diagnosis not present

## 2018-04-28 DIAGNOSIS — M160 Bilateral primary osteoarthritis of hip: Secondary | ICD-10-CM | POA: Diagnosis not present

## 2018-04-28 DIAGNOSIS — I7090 Unspecified atherosclerosis: Secondary | ICD-10-CM | POA: Diagnosis not present

## 2018-05-04 DIAGNOSIS — G894 Chronic pain syndrome: Secondary | ICD-10-CM | POA: Diagnosis not present

## 2018-05-04 DIAGNOSIS — Z79891 Long term (current) use of opiate analgesic: Secondary | ICD-10-CM | POA: Diagnosis not present

## 2018-05-04 DIAGNOSIS — M19019 Primary osteoarthritis, unspecified shoulder: Secondary | ICD-10-CM | POA: Diagnosis not present

## 2018-05-04 DIAGNOSIS — Z79899 Other long term (current) drug therapy: Secondary | ICD-10-CM | POA: Diagnosis not present

## 2018-05-04 DIAGNOSIS — M25559 Pain in unspecified hip: Secondary | ICD-10-CM | POA: Diagnosis not present

## 2018-05-14 IMAGING — CT CT CHEST W/O CM
2 of 3 series · 15 of 36 positions shown, 18 images · non-contrast
Comparison: 08/29/2015

CLINICAL DATA: Follow-up lung nodule.

EXAM:
CT CHEST WITHOUT CONTRAST
TECHNIQUE: Multidetector CT imaging of the chest was performed following the
standard protocol without IV contrast.

[Series 2: thorax · axial · 0.72mm/px · z∈[-292,-22]mm · 12 of 64 slices shown, 15 images]
[im 5/64  mediastinal]
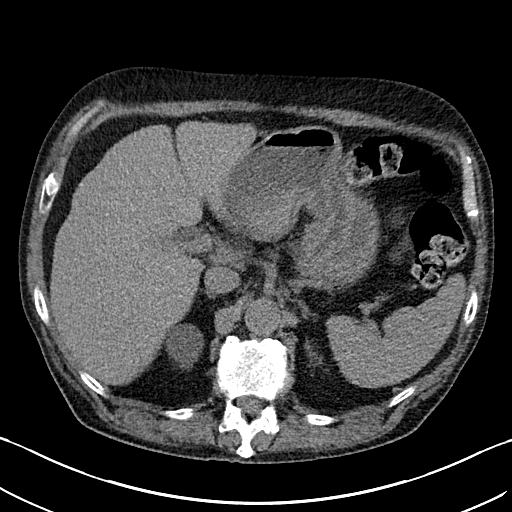
[im 5/64  lung]
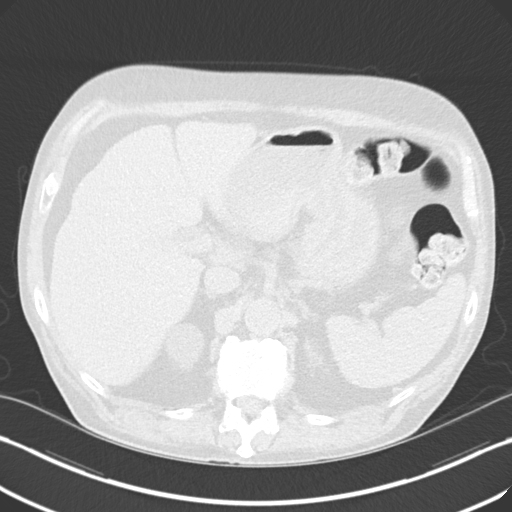
[im 10/64  lung]
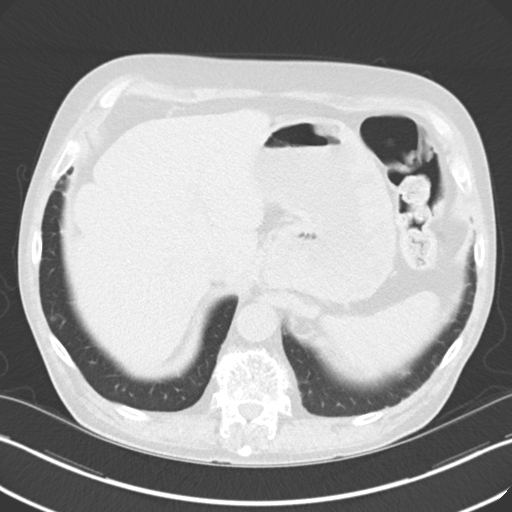
[im 15/64  lung]
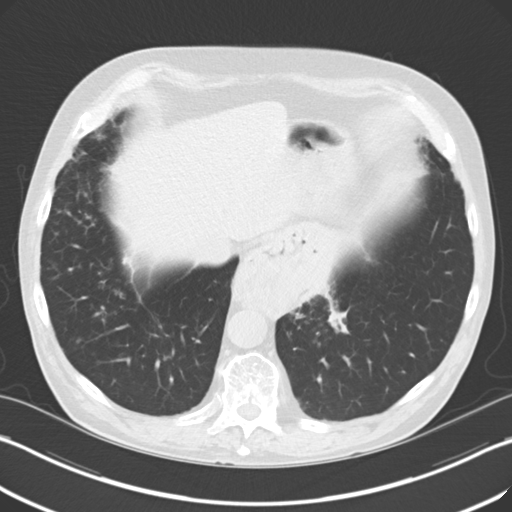
[im 19/64  lung]
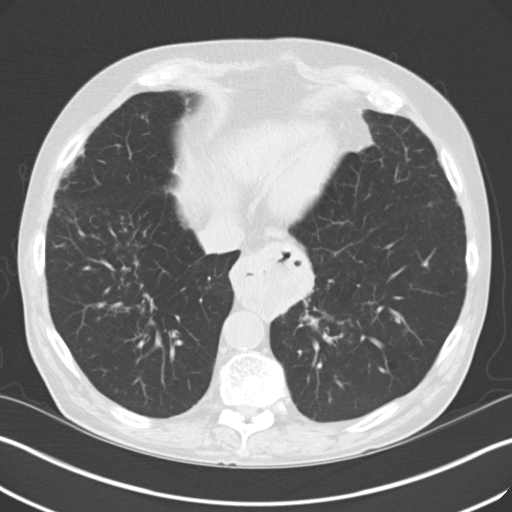
[im 24/64  mediastinal]
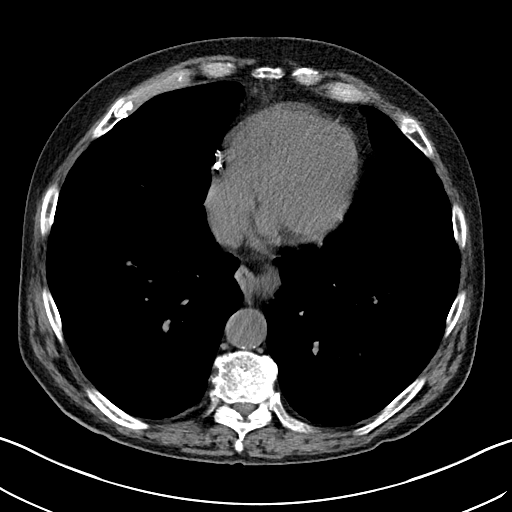
[im 24/64  lung]
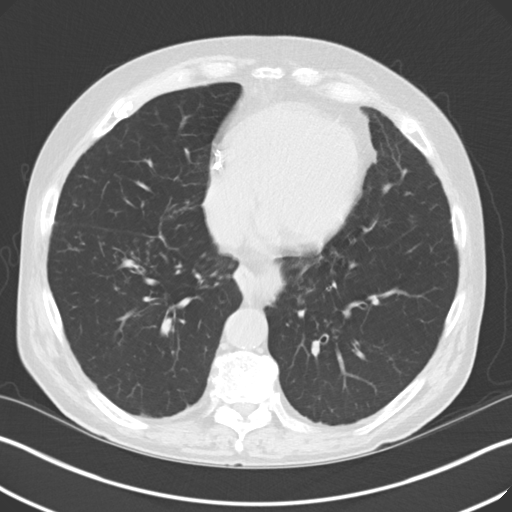
[im 29/64  lung]
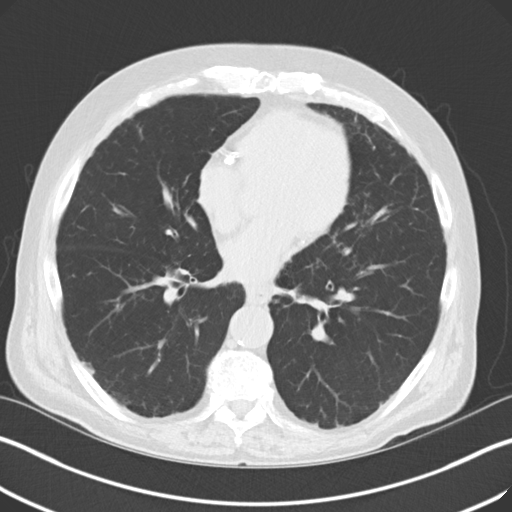
[im 36/64  lung]
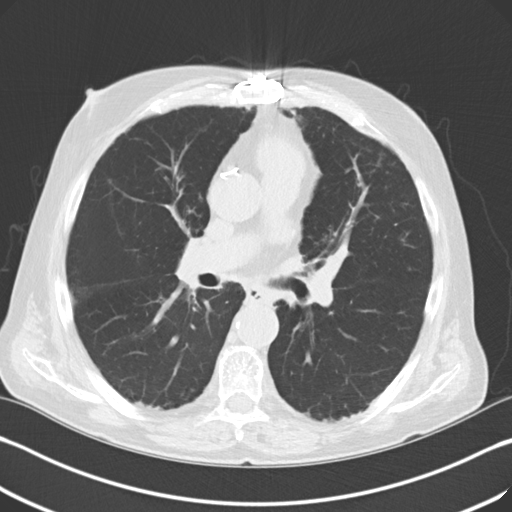
[im 40/64  lung]
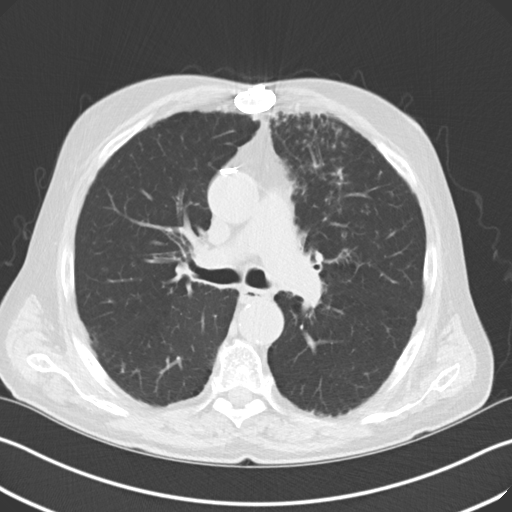
[im 45/64  mediastinal]
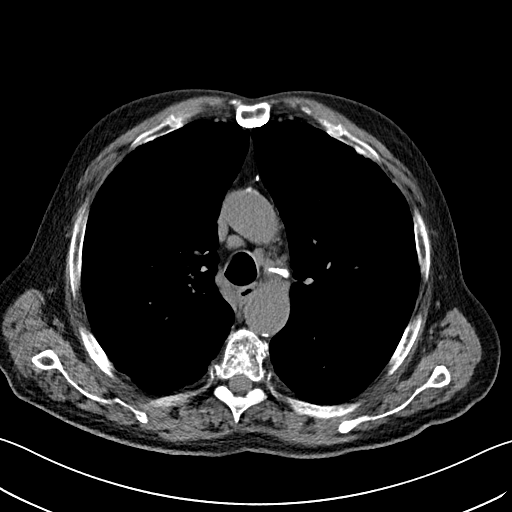
[im 45/64  lung]
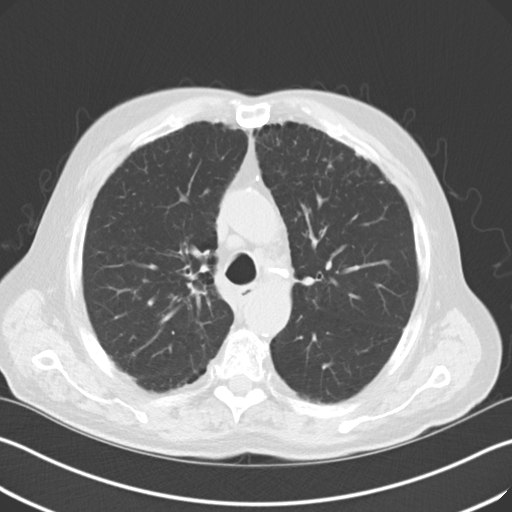
[im 50/64  lung]
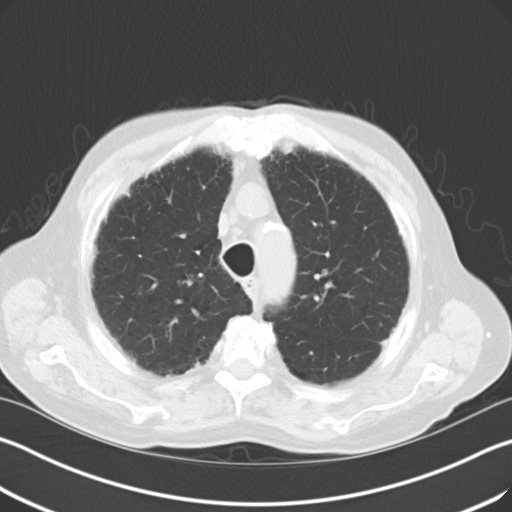
[im 54/64  lung]
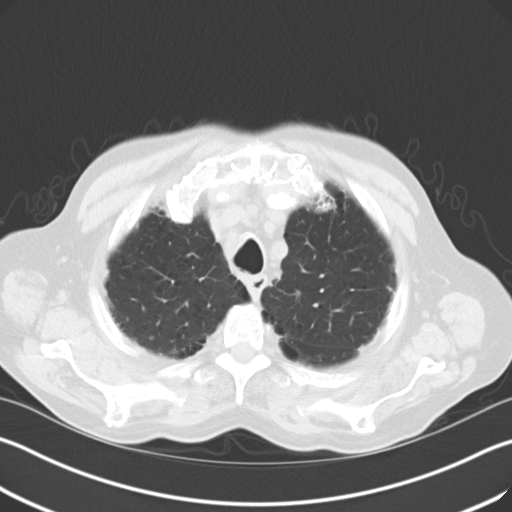
[im 59/64  lung]
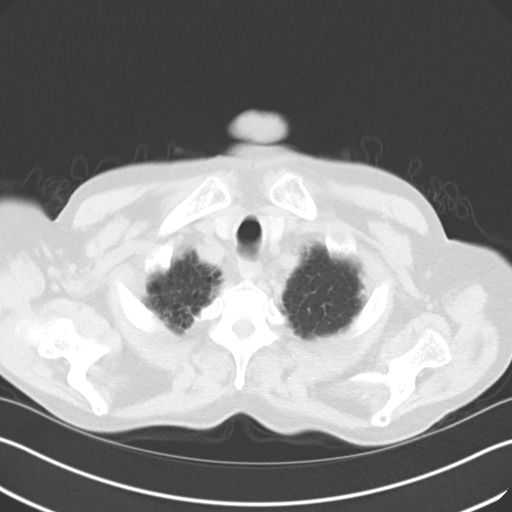

[Series 5: coronal · coronal · 0.65mm/px · 3 of 151 slices shown]
[im 31/151  lung]
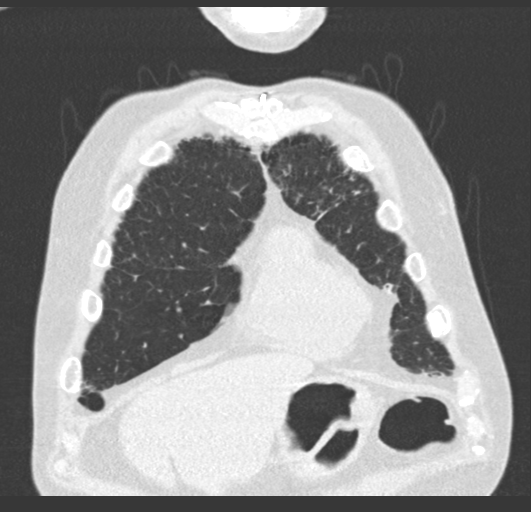
[im 61/151  lung]
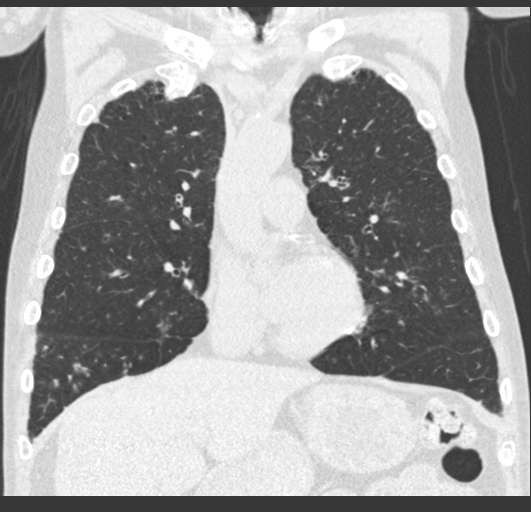
[im 91/151  lung]
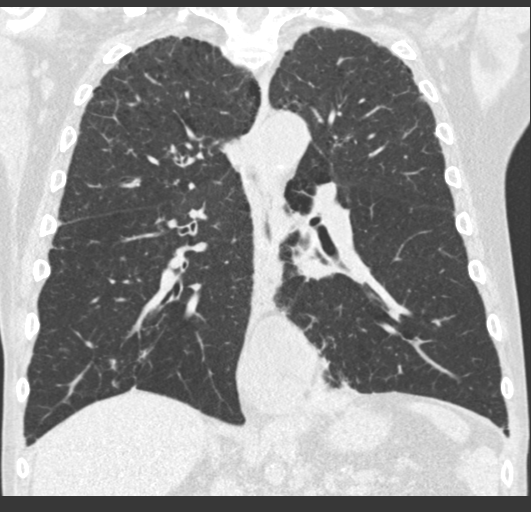

[15 of 36 positions shown; findings below may reference images not displayed]

FINDINGS: Cardiovascular: Prior CABG. Moderate scattered aortic
calcifications. No aneurysm. Heart is normal size.

Mediastinum/Nodes: Small scattered mediastinal lymph nodes, none
pathologically enlarged, stable since prior study. Index pretracheal
lymph node has a short axis diameter of 8 mm. Other smaller
scattered mediastinal lymph nodes. No axillary or visible hilar
adenopathy. Moderate-sized hiatal hernia.

Lungs/Pleura: Moderate centrilobular and paraseptal emphysema.
Tree-in-bud nodular densities are again noted in the lingula as well
as both lower lobes. Minimal scattered tree-in-bud densities in both
upper lobes. Findings are similar to prior study. No confluent
airspace opacity or pleural effusion. 5 mm nodule in the right upper
lobe on image 62 is stable.

Upper Abdomen: Imaging into the upper abdomen shows no acute
findings.

Musculoskeletal: Chest wall soft tissues are unremarkable. For no
acute bony abnormality or focal bone lesion.
IMPRESSION: Prior CABG.  Aortic atherosclerosis.

Moderate-sized hiatal hernia.

Stable 5 mm right upper lobe pulmonary nodule.

Stable scattered tree-in-bud densities in the lungs, most pronounced
in the lingula and lung bases, likely chronic inflammatory small
airways disease.

Moderate emphysema.

## 2018-05-20 DIAGNOSIS — J438 Other emphysema: Secondary | ICD-10-CM | POA: Diagnosis not present

## 2018-05-20 DIAGNOSIS — J209 Acute bronchitis, unspecified: Secondary | ICD-10-CM | POA: Diagnosis not present

## 2018-05-20 DIAGNOSIS — I2510 Atherosclerotic heart disease of native coronary artery without angina pectoris: Secondary | ICD-10-CM | POA: Diagnosis not present

## 2018-05-20 DIAGNOSIS — I10 Essential (primary) hypertension: Secondary | ICD-10-CM | POA: Diagnosis not present

## 2018-05-20 DIAGNOSIS — E139 Other specified diabetes mellitus without complications: Secondary | ICD-10-CM | POA: Diagnosis not present

## 2018-05-20 DIAGNOSIS — R0602 Shortness of breath: Secondary | ICD-10-CM | POA: Diagnosis not present

## 2018-05-20 DIAGNOSIS — R0902 Hypoxemia: Secondary | ICD-10-CM | POA: Diagnosis not present

## 2018-05-20 DIAGNOSIS — J449 Chronic obstructive pulmonary disease, unspecified: Secondary | ICD-10-CM | POA: Diagnosis not present

## 2018-05-20 DIAGNOSIS — K219 Gastro-esophageal reflux disease without esophagitis: Secondary | ICD-10-CM | POA: Diagnosis not present

## 2018-05-20 DIAGNOSIS — Z9181 History of falling: Secondary | ICD-10-CM | POA: Diagnosis not present

## 2018-05-28 ENCOUNTER — Other Ambulatory Visit: Payer: Self-pay | Admitting: Osteopathic Medicine

## 2018-05-28 DIAGNOSIS — J449 Chronic obstructive pulmonary disease, unspecified: Secondary | ICD-10-CM

## 2018-06-01 ENCOUNTER — Other Ambulatory Visit: Payer: Self-pay | Admitting: Osteopathic Medicine

## 2018-06-01 DIAGNOSIS — J449 Chronic obstructive pulmonary disease, unspecified: Secondary | ICD-10-CM

## 2018-06-01 DIAGNOSIS — M19019 Primary osteoarthritis, unspecified shoulder: Secondary | ICD-10-CM | POA: Diagnosis not present

## 2018-06-01 DIAGNOSIS — G894 Chronic pain syndrome: Secondary | ICD-10-CM | POA: Diagnosis not present

## 2018-06-01 DIAGNOSIS — M25559 Pain in unspecified hip: Secondary | ICD-10-CM | POA: Diagnosis not present

## 2018-06-01 DIAGNOSIS — Z79891 Long term (current) use of opiate analgesic: Secondary | ICD-10-CM | POA: Diagnosis not present

## 2018-06-01 DIAGNOSIS — M47816 Spondylosis without myelopathy or radiculopathy, lumbar region: Secondary | ICD-10-CM | POA: Diagnosis not present

## 2018-06-01 DIAGNOSIS — Z79899 Other long term (current) drug therapy: Secondary | ICD-10-CM | POA: Diagnosis not present

## 2018-06-01 NOTE — Telephone Encounter (Signed)
Please review for refill- patient at [facility] 

## 2018-06-08 DIAGNOSIS — M5126 Other intervertebral disc displacement, lumbar region: Secondary | ICD-10-CM | POA: Diagnosis not present

## 2018-06-10 DIAGNOSIS — M19019 Primary osteoarthritis, unspecified shoulder: Secondary | ICD-10-CM | POA: Diagnosis not present

## 2018-06-11 DIAGNOSIS — M47816 Spondylosis without myelopathy or radiculopathy, lumbar region: Secondary | ICD-10-CM | POA: Diagnosis not present

## 2018-06-18 DIAGNOSIS — J209 Acute bronchitis, unspecified: Secondary | ICD-10-CM | POA: Diagnosis not present

## 2018-06-18 DIAGNOSIS — J438 Other emphysema: Secondary | ICD-10-CM | POA: Diagnosis not present

## 2018-06-18 DIAGNOSIS — J449 Chronic obstructive pulmonary disease, unspecified: Secondary | ICD-10-CM | POA: Diagnosis not present

## 2018-06-18 DIAGNOSIS — R062 Wheezing: Secondary | ICD-10-CM | POA: Diagnosis not present

## 2018-06-25 DIAGNOSIS — M47816 Spondylosis without myelopathy or radiculopathy, lumbar region: Secondary | ICD-10-CM | POA: Diagnosis not present

## 2018-06-29 DIAGNOSIS — M1612 Unilateral primary osteoarthritis, left hip: Secondary | ICD-10-CM | POA: Diagnosis not present

## 2018-06-29 DIAGNOSIS — M19019 Primary osteoarthritis, unspecified shoulder: Secondary | ICD-10-CM | POA: Diagnosis not present

## 2018-06-29 DIAGNOSIS — M47816 Spondylosis without myelopathy or radiculopathy, lumbar region: Secondary | ICD-10-CM | POA: Diagnosis not present

## 2018-06-29 DIAGNOSIS — G894 Chronic pain syndrome: Secondary | ICD-10-CM | POA: Diagnosis not present

## 2018-06-29 DIAGNOSIS — M5136 Other intervertebral disc degeneration, lumbar region: Secondary | ICD-10-CM | POA: Diagnosis not present

## 2018-06-30 ENCOUNTER — Other Ambulatory Visit: Payer: Self-pay | Admitting: Osteopathic Medicine

## 2018-06-30 DIAGNOSIS — J302 Other seasonal allergic rhinitis: Secondary | ICD-10-CM

## 2018-06-30 DIAGNOSIS — M75102 Unspecified rotator cuff tear or rupture of left shoulder, not specified as traumatic: Secondary | ICD-10-CM | POA: Diagnosis not present

## 2018-06-30 DIAGNOSIS — R936 Abnormal findings on diagnostic imaging of limbs: Secondary | ICD-10-CM | POA: Diagnosis not present

## 2018-06-30 DIAGNOSIS — S43432A Superior glenoid labrum lesion of left shoulder, initial encounter: Secondary | ICD-10-CM | POA: Diagnosis not present

## 2018-06-30 DIAGNOSIS — M75122 Complete rotator cuff tear or rupture of left shoulder, not specified as traumatic: Secondary | ICD-10-CM | POA: Diagnosis not present

## 2018-06-30 DIAGNOSIS — S46112A Strain of muscle, fascia and tendon of long head of biceps, left arm, initial encounter: Secondary | ICD-10-CM | POA: Diagnosis not present

## 2018-06-30 DIAGNOSIS — M19012 Primary osteoarthritis, left shoulder: Secondary | ICD-10-CM | POA: Diagnosis not present

## 2018-06-30 DIAGNOSIS — M25412 Effusion, left shoulder: Secondary | ICD-10-CM | POA: Diagnosis not present

## 2018-06-30 DIAGNOSIS — M25512 Pain in left shoulder: Secondary | ICD-10-CM | POA: Diagnosis not present

## 2018-06-30 DIAGNOSIS — X58XXXA Exposure to other specified factors, initial encounter: Secondary | ICD-10-CM | POA: Diagnosis not present

## 2018-07-19 DIAGNOSIS — R062 Wheezing: Secondary | ICD-10-CM | POA: Diagnosis not present

## 2018-07-19 DIAGNOSIS — J209 Acute bronchitis, unspecified: Secondary | ICD-10-CM | POA: Diagnosis not present

## 2018-07-19 DIAGNOSIS — J449 Chronic obstructive pulmonary disease, unspecified: Secondary | ICD-10-CM | POA: Diagnosis not present

## 2018-07-19 DIAGNOSIS — J438 Other emphysema: Secondary | ICD-10-CM | POA: Diagnosis not present

## 2018-07-24 DIAGNOSIS — G609 Hereditary and idiopathic neuropathy, unspecified: Secondary | ICD-10-CM | POA: Diagnosis not present

## 2018-07-24 DIAGNOSIS — R69 Illness, unspecified: Secondary | ICD-10-CM | POA: Diagnosis not present

## 2018-07-24 DIAGNOSIS — M792 Neuralgia and neuritis, unspecified: Secondary | ICD-10-CM | POA: Diagnosis not present

## 2018-07-24 DIAGNOSIS — G894 Chronic pain syndrome: Secondary | ICD-10-CM | POA: Diagnosis not present

## 2018-07-29 DIAGNOSIS — M1612 Unilateral primary osteoarthritis, left hip: Secondary | ICD-10-CM | POA: Diagnosis not present

## 2018-07-29 DIAGNOSIS — Z79891 Long term (current) use of opiate analgesic: Secondary | ICD-10-CM | POA: Diagnosis not present

## 2018-07-29 DIAGNOSIS — G894 Chronic pain syndrome: Secondary | ICD-10-CM | POA: Diagnosis not present

## 2018-07-29 DIAGNOSIS — M19019 Primary osteoarthritis, unspecified shoulder: Secondary | ICD-10-CM | POA: Diagnosis not present

## 2018-07-29 DIAGNOSIS — M5136 Other intervertebral disc degeneration, lumbar region: Secondary | ICD-10-CM | POA: Diagnosis not present

## 2018-07-29 DIAGNOSIS — Z79899 Other long term (current) drug therapy: Secondary | ICD-10-CM | POA: Diagnosis not present

## 2018-08-04 ENCOUNTER — Other Ambulatory Visit: Payer: Self-pay | Admitting: Osteopathic Medicine

## 2018-08-04 DIAGNOSIS — J302 Other seasonal allergic rhinitis: Secondary | ICD-10-CM

## 2018-08-04 NOTE — Telephone Encounter (Signed)
Routing request to Dr Alexander's rx refill pool.

## 2018-08-13 ENCOUNTER — Other Ambulatory Visit: Payer: Self-pay

## 2018-08-18 DIAGNOSIS — R062 Wheezing: Secondary | ICD-10-CM | POA: Diagnosis not present

## 2018-08-18 DIAGNOSIS — J449 Chronic obstructive pulmonary disease, unspecified: Secondary | ICD-10-CM | POA: Diagnosis not present

## 2018-08-18 DIAGNOSIS — J209 Acute bronchitis, unspecified: Secondary | ICD-10-CM | POA: Diagnosis not present

## 2018-08-18 DIAGNOSIS — J438 Other emphysema: Secondary | ICD-10-CM | POA: Diagnosis not present

## 2018-08-20 ENCOUNTER — Ambulatory Visit (INDEPENDENT_AMBULATORY_CARE_PROVIDER_SITE_OTHER): Payer: Medicare HMO | Admitting: Osteopathic Medicine

## 2018-08-20 ENCOUNTER — Encounter: Payer: Self-pay | Admitting: Osteopathic Medicine

## 2018-08-20 VITALS — Temp 97.6°F | Wt 144.0 lb

## 2018-08-20 DIAGNOSIS — J441 Chronic obstructive pulmonary disease with (acute) exacerbation: Secondary | ICD-10-CM

## 2018-08-20 DIAGNOSIS — J42 Unspecified chronic bronchitis: Secondary | ICD-10-CM | POA: Diagnosis not present

## 2018-08-20 MED ORDER — AZITHROMYCIN 250 MG PO TABS: ORAL_TABLET | ORAL | 0 refills | Status: DC

## 2018-08-20 MED ORDER — PREDNISONE 50 MG PO TABS: ORAL_TABLET | ORAL | 0 refills | Status: DC

## 2018-08-20 NOTE — Progress Notes (Signed)
Virtual Visit via Phone Note  I connected with      Tyler KESSEN Sr. on 08/20/18 at 11:50 by a telemedicine application and verified that I am speaking with the correct person using two identifiers.  Home phone rings and then disconnects, no voicemail. Tried twice.     I discussed the limitations of evaluation and management by telemedicine and the availability of in person appointments. The patient expressed understanding and agreed to proceed.  History of Present Illness: [name]. is a 78 y.o. male who would like to discuss COPD   Breathing issues worse over the past week. Coughing and congestion. Nasal congestion, productive cough. No fever.        Observations/Objective: Temp 97.6 F (36.4 C) (Oral)   Wt 144 lb (65.3 kg)   BMI 23.96 kg/m  BP Readings from Last 3 Encounters:  03/18/18 120/60  02/27/18 118/76  10/21/17 114/75   Exam: Normal Speech.    Lab and Radiology Results No results found for this or any previous visit (from the past 72 hour(s)). No results found.     Assessment and Plan: 78 y.o. male with The primary encounter diagnosis was COPD exacerbation ([place]). A diagnosis of Chronic bronchitis, unspecified chronic bronchitis type ([place]) was also pertinent to this visit.   PDMP not reviewed this encounter. No orders of the defined types were placed in this encounter.  Meds ordered this encounter  Medications  . azithromycin (ZITHROMAX) 250 MG tablet    Sig: Take 2 tablets po once on Day 1, then 1 tablet po daily for 4 days    Dispense:  6 tablet    Refill:  0  . predniSONE (DELTASONE) 50 MG tablet    Sig: One tab PO daily for 5 days.    Dispense:  5 tablet    Refill:  0   There are no Patient Instructions on file for this visit.  Instructions sent via MyChart. If MyChart not available, pt was given option for info via personal e-mail w/ no guarantee of protected health info over unsecured e-mail communication, and MyChart  sign-up instructions were included.   Follow Up Instructions: Return if symptoms worsen or fail to improve will need to be seen in office/UC/ER.    I discussed the assessment and treatment plan with the patient. The patient was provided an opportunity to ask questions and all were answered. The patient agreed with the plan and demonstrated an understanding of the instructions.   The patient was advised to call back or seek an in-person evaluation if any new concerns, if symptoms worsen or if the condition fails to improve as anticipated.  21 minutes of non-face-to-face time was provided during this encounter.                      Historical information moved to improve visibility of documentation.  Past Medical History:  Diagnosis Date  . Acute encephalopathy 12/28/2015  . Allergy   . Anxiety   . Asthma   . Benign prostatic hypertrophy   . CAD (coronary artery disease)    a. s/p multiple PCIs to RCA and eventual CABG in 1999;  b.LHC 11/2003: CFX 95%, RCA occluded, SVG-RCA occluded, SVG-OM patent, native LAD patent. EF was 55%.  Patient had left to right collaterals and medical therapy was recommended    . Chronic kidney disease   . COPD (chronic obstructive pulmonary disease) ([place])   . DDD (degenerative disc disease), lumbar   .  Dementia ([place])   . Depression   . Diabetes mellitus    on no medications  . Diverticulitis, colon   . Emphysema of lung ([place])   . Esophageal stricture   . GERD (gastroesophageal reflux disease)   . Herniated disc   . Hiatal hernia   . History of colonic polyps   . HOH (hard of hearing)   . Hx of colonoscopy   . Hyperlipidemia   . Hypertension   . Insomnia   . Iron deficiency anemia   . Lumbar back pain    chronic  . Memory loss   . OA (osteoarthritis)   . OSA (obstructive sleep apnea)    wife denies this  . Pneumonia   . PVD (peripheral vascular disease) ([place])   . Shortness of breath dyspnea    on home O2  . Spinal stenosis     Past Surgical History:  Procedure Laterality Date  . bilateral knee replacements    . CARPAL TUNNEL RELEASE    . CIRCUMCISION    . COLONOSCOPY N/A 10/13/2015   Procedure: COLONOSCOPY;  Surgeon: Irene Shipper, MD;  [facility]COPY;  Service: Endoscopy;  Laterality: N/A;  . CORONARY ANGIOPLASTY WITH STENT PLACEMENT  2/99  . CORONARY ARTERY BYPASS GRAFT  09/1997   double bypss  . EYE SURGERY  06/2011   " eye muscle   . heart bypass    . HEMORRHOID SURGERY    . SHOULDER ARTHROSCOPY  right  . STRABISMUS SURGERY  08/29/2011   Procedure: REPAIR STRABISMUS;  Surgeon: Derry Skill, MD;  Location: [facility];  Service: Ophthalmology;  Laterality: Left;   Social History   Tobacco Use  . Smoking status: Current Every Day Smoker    Packs/day: 1.00    Years: 15.00    Pack years: 15.00    Types: Cigarettes    Start date: 02/14/1948  . Smokeless tobacco: Never Used  . Tobacco comment: Peak rate of 3ppd  Substance Use Topics  . Alcohol use: No    Alcohol/week: 0.0 standard drinks    Comment: none for 20 years    family history includes Allergies in his unknown relative; Breast cancer in his daughter; Diabetes in his unknown relative; Emphysema in his father; Heart disease in his unknown relative; Hyperlipidemia in his unknown relative; Hypertension in his unknown relative; Kidney cancer in his daughter; Stroke in his father.  Medications: Current Outpatient Medications  Medication Sig Dispense Refill  . benzonatate (TESSALON) 200 MG capsule TAKE 1 CAPSULE BY MOUTH 3 TIMES DAILY AS NEEDED FOR COUGH. 90 capsule 3  . donepezil (ARICEPT) 10 MG tablet TAKE 1 TABLET BY MOUTH AT BEDTIME. 90 tablet 3  . DULoxetine (CYMBALTA) 60 MG capsule Take 1 capsule by mouth daily.    . fluticasone (FLONASE) 50 MCG/ACT nasal spray USE 2 SPRAYS IN EACH NOSTRIL EVERY DAY 48 g 3  . HYDROcodone-acetaminophen (NORCO) 10-325 MG tablet Take 1 tablet by mouth every 4 (four) hours as needed.    Marland Kitchen ipratropium  (ATROVENT) 0.06 % nasal spray Place 2 sprays into both nostrils 4 (four) times daily. 15 mL 1  . ketoconazole (NIZORAL) 2 % shampoo APPLY TOPICALLY TWICE WEEKLY 120 mL 1  . lamoTRIgine (LAMICTAL) 100 MG tablet Take 50-100 mg by mouth 2 (two) times daily. 50mg  in the morning, and 100mg  in the evening    . loratadine (CLARITIN) 10 MG tablet TAKE 1 TABLET BY MOUTH DAILY. 30 tablet 0  . Multiple Vitamin (MULTIVITAMIN) tablet  Take 1 tablet by mouth daily.    . nitroGLYCERIN (NITROSTAT) 0.4 MG SL tablet Place 1 tablet (0.4 mg total) every 5 (five) minutes as needed under the tongue. For chest pain 25 tablet 6  . omeprazole (PRILOSEC) 20 MG capsule TAKE 1 CAPSULE BY MOUTH 2 TIMES DAILY BEFORE A MEAL. 60 capsule 11  . OXYGEN Inhale 2.5 L into the lungs at bedtime.    . pravastatin (PRAVACHOL) 40 MG tablet TAKE 1 TABLET BY MOUTH DAILY. 90 tablet 3  . TRELEGY ELLIPTA 100-62.5-25 MCG/INH AEPB SHAKE WELL AND INHALE 1 PUFF INTO THE LUNGS DAILY  11  . zolpidem (AMBIEN) 10 MG tablet Take 0.5 tablets (5 mg total) by mouth at bedtime as needed for sleep.    Marland Kitchen albuterol (PROVENTIL HFA;VENTOLIN HFA) 108 (90 Base) MCG/ACT inhaler Inhale 1-2 puffs into the lungs every 4 (four) hours as needed for wheezing or shortness of breath. 2 Inhaler 12  . azithromycin (ZITHROMAX) 250 MG tablet Take 2 tablets po once on Day 1, then 1 tablet po daily for 4 days 6 tablet 0  . predniSONE (DELTASONE) 50 MG tablet One tab PO daily for 5 days. 5 tablet 0   No current facility-administered medications for this visit.    Allergies  Allergen Reactions  . Morphine Itching    REACTION: itching  . Celecoxib Nausea And Vomiting    REACTION: GI upset  . Chicken Protein Other (See Comments)    Will not eat chicken  . Fish Allergy Other (See Comments)    Patient will not eat, does NOT like  . Nalbuphine Other (See Comments)    REACTION: contraindication with oxycontin.  . Prednisone Other (See Comments)    REACTION: "yeast  infections" Yeast infection.  . Venlafaxine Other (See Comments)    REACTION: GI upset  . Chicken Allergy Other (See Comments)    Will not eat chicken  . Fish-Derived Products Other (See Comments)    Will not eat seafood of any kind    PDMP not reviewed this encounter. No orders of the defined types were placed in this encounter.  Meds ordered this encounter  Medications  . azithromycin (ZITHROMAX) 250 MG tablet    Sig: Take 2 tablets po once on Day 1, then 1 tablet po daily for 4 days    Dispense:  6 tablet    Refill:  0  . predniSONE (DELTASONE) 50 MG tablet    Sig: One tab PO daily for 5 days.    Dispense:  5 tablet    Refill:  0

## 2018-08-25 DIAGNOSIS — R69 Illness, unspecified: Secondary | ICD-10-CM | POA: Diagnosis not present

## 2018-08-25 DIAGNOSIS — F5101 Primary insomnia: Secondary | ICD-10-CM | POA: Diagnosis not present

## 2018-08-25 DIAGNOSIS — F0281 Dementia in other diseases classified elsewhere with behavioral disturbance: Secondary | ICD-10-CM | POA: Diagnosis not present

## 2018-08-25 DIAGNOSIS — F330 Major depressive disorder, recurrent, mild: Secondary | ICD-10-CM | POA: Diagnosis not present

## 2018-08-26 DIAGNOSIS — M19019 Primary osteoarthritis, unspecified shoulder: Secondary | ICD-10-CM | POA: Diagnosis not present

## 2018-08-26 DIAGNOSIS — G894 Chronic pain syndrome: Secondary | ICD-10-CM | POA: Diagnosis not present

## 2018-08-26 DIAGNOSIS — M1612 Unilateral primary osteoarthritis, left hip: Secondary | ICD-10-CM | POA: Diagnosis not present

## 2018-08-26 DIAGNOSIS — M5136 Other intervertebral disc degeneration, lumbar region: Secondary | ICD-10-CM | POA: Diagnosis not present

## 2018-09-10 ENCOUNTER — Other Ambulatory Visit: Payer: Self-pay | Admitting: Osteopathic Medicine

## 2018-09-10 DIAGNOSIS — J302 Other seasonal allergic rhinitis: Secondary | ICD-10-CM

## 2018-09-18 DIAGNOSIS — J438 Other emphysema: Secondary | ICD-10-CM | POA: Diagnosis not present

## 2018-09-18 DIAGNOSIS — J449 Chronic obstructive pulmonary disease, unspecified: Secondary | ICD-10-CM | POA: Diagnosis not present

## 2018-09-18 DIAGNOSIS — R062 Wheezing: Secondary | ICD-10-CM | POA: Diagnosis not present

## 2018-09-18 DIAGNOSIS — J209 Acute bronchitis, unspecified: Secondary | ICD-10-CM | POA: Diagnosis not present

## 2018-09-22 DIAGNOSIS — M19019 Primary osteoarthritis, unspecified shoulder: Secondary | ICD-10-CM | POA: Diagnosis not present

## 2018-09-22 DIAGNOSIS — Z79891 Long term (current) use of opiate analgesic: Secondary | ICD-10-CM | POA: Diagnosis not present

## 2018-09-22 DIAGNOSIS — M1612 Unilateral primary osteoarthritis, left hip: Secondary | ICD-10-CM | POA: Diagnosis not present

## 2018-09-22 DIAGNOSIS — G894 Chronic pain syndrome: Secondary | ICD-10-CM | POA: Diagnosis not present

## 2018-09-22 DIAGNOSIS — M5136 Other intervertebral disc degeneration, lumbar region: Secondary | ICD-10-CM | POA: Diagnosis not present

## 2018-09-22 DIAGNOSIS — Z79899 Other long term (current) drug therapy: Secondary | ICD-10-CM | POA: Diagnosis not present

## 2018-09-30 ENCOUNTER — Ambulatory Visit (INDEPENDENT_AMBULATORY_CARE_PROVIDER_SITE_OTHER): Payer: Medicare HMO | Admitting: Osteopathic Medicine

## 2018-09-30 ENCOUNTER — Encounter: Payer: Self-pay | Admitting: Osteopathic Medicine

## 2018-09-30 DIAGNOSIS — R634 Abnormal weight loss: Secondary | ICD-10-CM

## 2018-09-30 DIAGNOSIS — R630 Anorexia: Secondary | ICD-10-CM | POA: Diagnosis not present

## 2018-09-30 MED ORDER — MIRTAZAPINE 7.5 MG PO TABS: 7.5000 mg | ORAL_TABLET | Freq: Every day | ORAL | 1 refills | Status: DC

## 2018-09-30 MED ORDER — DULOXETINE HCL 30 MG PO CPEP: 30.0000 mg | ORAL_CAPSULE | Freq: Every day | ORAL | 1 refills | Status: AC

## 2018-09-30 NOTE — Progress Notes (Signed)
Virtual Visit via Phone Note  I connected with      Tyler ZEPEDA Sr. on 09/30/18 at 3:30 by a telemedicine application and verified that I am speaking with the correct person using two identifiers.  Patient is at home I am in office   I discussed the limitations of evaluation and management by telemedicine and the availability of in person appointments. The patient expressed understanding and agreed to proceed.  History of Present Illness: [name]. is a 78 y.o. male who would like to discuss appetite   Decreased appetite  Wife was in the hospital for surgery and was admitted several days. In that time, Mr Albright son and grandkids were helping him out, no reports of any problems but when wife came back home, she noticed he wasn't eating. Absolutely no appetite. Wife reports he is barely eating anything at all. Ongoing 4 weeks. No known weight. Pt has not reported dysphagia to wife, but he tells me it hurts to eat. He states "I just don't want anything to eat" and feels fine otherwise, no increased SOB, no pain, no fever.    Wt Readings from Last 3 Encounters:  08/20/18 144 lb (65.3 kg)  03/18/18 149 lb 6.4 oz (67.8 kg)  02/27/18 153 lb 6.4 oz (69.6 kg)       At today's visit 09/30/18 ... PMH, PSH, FH reviewed and updated as needed.  Current medication list and allergy/intolerance hx reviewed and updated as needed. (See remainder of HPI, ROS, Phys Exam below)   No results found.  No results found for this or any previous visit (from the past 72 hour(s)).       Observations/Objective: There were no vitals taken for this visit.  Wt Readings from Last 3 Encounters:  08/20/18 144 lb (65.3 kg)  03/18/18 149 lb 6.4 oz (67.8 kg)  02/27/18 153 lb 6.4 oz (69.6 kg)    BP Readings from Last 3 Encounters:  03/18/18 120/60  02/27/18 118/76  10/21/17 114/75   Exam: Normal Speech.    Lab and Radiology Results No results found for this or any previous  visit (from the past 72 hour(s)). No results found.     Assessment and Plan: 78 y.o. male with The primary encounter diagnosis was Decrease in appetite. A diagnosis of Unintended weight loss was also pertinent to this visit.   Will trial adding Remeron and decrease Cymbalta to reduce risk serotonin syndrome.  He would like to hold off on CT since he's getting one anyway w/ pulmonology 10/13/18. Would have a low threshold for getting CT abd/pelvis but given his smoking history...  DDx: GI issue such as ulcer or esophageal stricture, malignancy, infection, complicated by dementia, possible hypoxia, other.  ER precautions reviewed w/ [name].    PDMP not reviewed this encounter. Orders Placed This Encounter  Procedures  . Urine Culture  . CBC with Differential/Platelet  . COMPLETE METABOLIC PANEL WITH GFR  . TSH  . T4, free  . Urinalysis, Routine w reflex microscopic   Meds ordered this encounter  Medications  . mirtazapine (REMERON) 7.5 MG tablet    Sig: Take 1 tablet (7.5 mg total) by mouth at bedtime.    Dispense:  30 tablet    Refill:  1  . DULoxetine (CYMBALTA) 30 MG capsule    Sig: Take 1 capsule (30 mg total) by mouth daily.    Dispense:  30 capsule    Refill:  1   There  are no Patient Instructions on file for this visit.  Instructions sent via MyChart. If MyChart not available, pt was given option for info via personal e-mail w/ no guarantee of protected health info over unsecured e-mail communication, and MyChart sign-up instructions were included.   Follow Up Instructions: Return for recheck depending on labs - will call .    I discussed the assessment and treatment plan with the patient. The patient was provided an opportunity to ask questions and all were answered. The patient agreed with the plan and demonstrated an understanding of the instructions.   The patient was advised to call back or seek an in-person evaluation if any new concerns, if symptoms  worsen or if the condition fails to improve as anticipated.  25 minutes of non-face-to-face time was provided during this encounter.                      Historical information moved to improve visibility of documentation.  Past Medical History:  Diagnosis Date  . Acute encephalopathy 12/28/2015  . Allergy   . Anxiety   . Asthma   . Benign prostatic hypertrophy   . CAD (coronary artery disease)    a. s/p multiple PCIs to RCA and eventual CABG in 1999;  b.LHC 11/2003: CFX 95%, RCA occluded, SVG-RCA occluded, SVG-OM patent, native LAD patent. EF was 55%.  Patient had left to right collaterals and medical therapy was recommended    . Chronic kidney disease   . COPD (chronic obstructive pulmonary disease) ([place])   . DDD (degenerative disc disease), lumbar   . Dementia ([place])   . Depression   . Diabetes mellitus    on no medications  . Diverticulitis, colon   . Emphysema of lung ([place])   . Esophageal stricture   . GERD (gastroesophageal reflux disease)   . Herniated disc   . Hiatal hernia   . History of colonic polyps   . HOH (hard of hearing)   . Hx of colonoscopy   . Hyperlipidemia   . Hypertension   . Insomnia   . Iron deficiency anemia   . Lumbar back pain    chronic  . Memory loss   . OA (osteoarthritis)   . OSA (obstructive sleep apnea)    wife denies this  . Pneumonia   . PVD (peripheral vascular disease) ([place])   . Shortness of breath dyspnea    on home O2  . Spinal stenosis    Past Surgical History:  Procedure Laterality Date  . bilateral knee replacements    . CARPAL TUNNEL RELEASE    . CIRCUMCISION    . COLONOSCOPY N/A 10/13/2015   Procedure: COLONOSCOPY;  Surgeon: Irene Shipper, MD;  Location: [facility] ENDOSCOPY;  Service: Endoscopy;  Laterality: N/A;  . CORONARY ANGIOPLASTY WITH STENT PLACEMENT  2/99  . CORONARY ARTERY BYPASS GRAFT  09/1997   double bypss  . EYE SURGERY  06/2011   " eye muscle   . heart bypass    . HEMORRHOID SURGERY    .  SHOULDER ARTHROSCOPY  right  . STRABISMUS SURGERY  08/29/2011   Procedure: REPAIR STRABISMUS;  Surgeon: Derry Skill, MD;  Location: [place];  Service: Ophthalmology;  Laterality: Left;   Social History   Tobacco Use  . Smoking status: Current Every Day Smoker    Packs/day: 1.00    Years: 15.00    Pack years: 15.00    Types: Cigarettes    Start date: 02/14/1948  .  Smokeless tobacco: Never Used  . Tobacco comment: Peak rate of 3ppd  Substance Use Topics  . Alcohol use: No    Alcohol/week: 0.0 standard drinks    Comment: none for 20 years    family history includes Allergies in his unknown relative; Breast cancer in his daughter; Diabetes in his unknown relative; Emphysema in his father; Heart disease in his unknown relative; Hyperlipidemia in his unknown relative; Hypertension in his unknown relative; Kidney cancer in his daughter; Stroke in his father.  Medications: Current Outpatient Medications  Medication Sig Dispense Refill  . azithromycin (ZITHROMAX) 250 MG tablet Take 2 tablets po once on Day 1, then 1 tablet po daily for 4 days 6 tablet 0  . benzonatate (TESSALON) 200 MG capsule TAKE 1 CAPSULE BY MOUTH 3 TIMES DAILY AS NEEDED FOR COUGH. 90 capsule 3  . donepezil (ARICEPT) 10 MG tablet TAKE 1 TABLET BY MOUTH AT BEDTIME. 90 tablet 3  . DULoxetine (CYMBALTA) 30 MG capsule Take 1 capsule (30 mg total) by mouth daily. 30 capsule 1  . fluticasone (FLONASE) 50 MCG/ACT nasal spray USE 2 SPRAYS IN EACH NOSTRIL EVERY DAY 48 g 3  . HYDROcodone-acetaminophen (NORCO) 10-325 MG tablet Take 1 tablet by mouth every 4 (four) hours as needed.    Marland Kitchen ipratropium (ATROVENT) 0.06 % nasal spray Place 2 sprays into both nostrils 4 (four) times daily. 15 mL 1  . ketoconazole (NIZORAL) 2 % shampoo APPLY TOPICALLY TWICE WEEKLY 120 mL 1  . lamoTRIgine (LAMICTAL) 100 MG tablet Take 50-100 mg by mouth 2 (two) times daily. 50mg  in the morning, and 100mg  in the evening    . loratadine (CLARITIN) 10 MG tablet  TAKE 1 TABLET BY MOUTH DAILY. *NEEDS OFFICE VISIT FOR FURTHER REFILLS* 90 tablet 1  . Multiple Vitamin (MULTIVITAMIN) tablet Take 1 tablet by mouth daily.    . nitroGLYCERIN (NITROSTAT) 0.4 MG SL tablet Place 1 tablet (0.4 mg total) every 5 (five) minutes as needed under the tongue. For chest pain 25 tablet 6  . omeprazole (PRILOSEC) 20 MG capsule TAKE 1 CAPSULE BY MOUTH 2 TIMES DAILY BEFORE A MEAL. 60 capsule 11  . OXYGEN Inhale 2.5 L into the lungs at bedtime.    . pravastatin (PRAVACHOL) 40 MG tablet TAKE 1 TABLET BY MOUTH DAILY. 90 tablet 3  . predniSONE (DELTASONE) 50 MG tablet One tab PO daily for 5 days. 5 tablet 0  . TRELEGY ELLIPTA 100-62.5-25 MCG/INH AEPB SHAKE WELL AND INHALE 1 PUFF INTO THE LUNGS DAILY  11  . zolpidem (AMBIEN) 10 MG tablet Take 0.5 tablets (5 mg total) by mouth at bedtime as needed for sleep.    Marland Kitchen albuterol (PROVENTIL HFA;VENTOLIN HFA) 108 (90 Base) MCG/ACT inhaler Inhale 1-2 puffs into the lungs every 4 (four) hours as needed for wheezing or shortness of breath. 2 Inhaler 12  . mirtazapine (REMERON) 7.5 MG tablet Take 1 tablet (7.5 mg total) by mouth at bedtime. 30 tablet 1   No current facility-administered medications for this visit.    Allergies  Allergen Reactions  . Morphine Itching    REACTION: itching  . Celecoxib Nausea And Vomiting    REACTION: GI upset  . Chicken Protein Other (See Comments)    Will not eat chicken  . Fish Allergy Other (See Comments)    Patient will not eat, does NOT like  . Nalbuphine Other (See Comments)    REACTION: contraindication with oxycontin.  . Prednisone Other (See Comments)    REACTION: "yeast  infections" Yeast infection.  . Venlafaxine Other (See Comments)    REACTION: GI upset  . Chicken Allergy Other (See Comments)    Will not eat chicken  . Fish-Derived Products Other (See Comments)    Will not eat seafood of any kind    PDMP not reviewed this encounter. Orders Placed This Encounter  Procedures  . Urine  Culture  . CBC with Differential/Platelet  . COMPLETE METABOLIC PANEL WITH GFR  . TSH  . T4, free  . Urinalysis, Routine w reflex microscopic   Meds ordered this encounter  Medications  . mirtazapine (REMERON) 7.5 MG tablet    Sig: Take 1 tablet (7.5 mg total) by mouth at bedtime.    Dispense:  30 tablet    Refill:  1  . DULoxetine (CYMBALTA) 30 MG capsule    Sig: Take 1 capsule (30 mg total) by mouth daily.    Dispense:  30 capsule    Refill:  1

## 2018-10-04 DIAGNOSIS — R52 Pain, unspecified: Secondary | ICD-10-CM | POA: Diagnosis not present

## 2018-10-04 DIAGNOSIS — S2242XA Multiple fractures of ribs, left side, initial encounter for closed fracture: Secondary | ICD-10-CM | POA: Diagnosis not present

## 2018-10-04 DIAGNOSIS — J449 Chronic obstructive pulmonary disease, unspecified: Secondary | ICD-10-CM | POA: Diagnosis not present

## 2018-10-04 DIAGNOSIS — R4020 Unspecified coma: Secondary | ICD-10-CM | POA: Diagnosis not present

## 2018-10-04 DIAGNOSIS — N183 Chronic kidney disease, stage 3 (moderate): Secondary | ICD-10-CM | POA: Diagnosis not present

## 2018-10-04 DIAGNOSIS — W19XXXA Unspecified fall, initial encounter: Secondary | ICD-10-CM | POA: Diagnosis not present

## 2018-10-04 DIAGNOSIS — E1122 Type 2 diabetes mellitus with diabetic chronic kidney disease: Secondary | ICD-10-CM | POA: Diagnosis not present

## 2018-10-04 DIAGNOSIS — N179 Acute kidney failure, unspecified: Secondary | ICD-10-CM | POA: Diagnosis not present

## 2018-10-04 DIAGNOSIS — S2232XA Fracture of one rib, left side, initial encounter for closed fracture: Secondary | ICD-10-CM | POA: Diagnosis not present

## 2018-10-04 DIAGNOSIS — Z79891 Long term (current) use of opiate analgesic: Secondary | ICD-10-CM | POA: Diagnosis not present

## 2018-10-04 DIAGNOSIS — I129 Hypertensive chronic kidney disease with stage 1 through stage 4 chronic kidney disease, or unspecified chronic kidney disease: Secondary | ICD-10-CM | POA: Diagnosis not present

## 2018-10-04 DIAGNOSIS — R0781 Pleurodynia: Secondary | ICD-10-CM | POA: Diagnosis not present

## 2018-10-04 DIAGNOSIS — S0101XA Laceration without foreign body of scalp, initial encounter: Secondary | ICD-10-CM | POA: Diagnosis not present

## 2018-10-04 DIAGNOSIS — K219 Gastro-esophageal reflux disease without esophagitis: Secondary | ICD-10-CM | POA: Diagnosis not present

## 2018-10-04 DIAGNOSIS — R69 Illness, unspecified: Secondary | ICD-10-CM | POA: Diagnosis not present

## 2018-10-04 DIAGNOSIS — Y998 Other external cause status: Secondary | ICD-10-CM | POA: Diagnosis not present

## 2018-10-04 DIAGNOSIS — Z209 Contact with and (suspected) exposure to unspecified communicable disease: Secondary | ICD-10-CM | POA: Diagnosis not present

## 2018-10-04 DIAGNOSIS — S098XXA Other specified injuries of head, initial encounter: Secondary | ICD-10-CM | POA: Diagnosis not present

## 2018-10-04 DIAGNOSIS — S3991XA Unspecified injury of abdomen, initial encounter: Secondary | ICD-10-CM | POA: Diagnosis not present

## 2018-10-05 ENCOUNTER — Telehealth: Payer: Self-pay

## 2018-10-05 DIAGNOSIS — R410 Disorientation, unspecified: Secondary | ICD-10-CM | POA: Diagnosis not present

## 2018-10-05 DIAGNOSIS — S2242XA Multiple fractures of ribs, left side, initial encounter for closed fracture: Secondary | ICD-10-CM | POA: Diagnosis not present

## 2018-10-05 DIAGNOSIS — I129 Hypertensive chronic kidney disease with stage 1 through stage 4 chronic kidney disease, or unspecified chronic kidney disease: Secondary | ICD-10-CM | POA: Diagnosis not present

## 2018-10-05 DIAGNOSIS — J690 Pneumonitis due to inhalation of food and vomit: Secondary | ICD-10-CM | POA: Diagnosis not present

## 2018-10-05 DIAGNOSIS — R918 Other nonspecific abnormal finding of lung field: Secondary | ICD-10-CM | POA: Diagnosis not present

## 2018-10-05 DIAGNOSIS — N183 Chronic kidney disease, stage 3 (moderate): Secondary | ICD-10-CM | POA: Diagnosis not present

## 2018-10-05 DIAGNOSIS — W19XXXA Unspecified fall, initial encounter: Secondary | ICD-10-CM | POA: Diagnosis not present

## 2018-10-05 DIAGNOSIS — I639 Cerebral infarction, unspecified: Secondary | ICD-10-CM | POA: Diagnosis not present

## 2018-10-05 DIAGNOSIS — D649 Anemia, unspecified: Secondary | ICD-10-CM | POA: Diagnosis not present

## 2018-10-05 DIAGNOSIS — Z7951 Long term (current) use of inhaled steroids: Secondary | ICD-10-CM | POA: Diagnosis not present

## 2018-10-05 DIAGNOSIS — D638 Anemia in other chronic diseases classified elsewhere: Secondary | ICD-10-CM | POA: Diagnosis not present

## 2018-10-05 DIAGNOSIS — W010XXA Fall on same level from slipping, tripping and stumbling without subsequent striking against object, initial encounter: Secondary | ICD-10-CM | POA: Diagnosis not present

## 2018-10-05 DIAGNOSIS — H449 Unspecified disorder of globe: Secondary | ICD-10-CM | POA: Diagnosis not present

## 2018-10-05 DIAGNOSIS — R296 Repeated falls: Secondary | ICD-10-CM | POA: Diagnosis not present

## 2018-10-05 DIAGNOSIS — R7989 Other specified abnormal findings of blood chemistry: Secondary | ICD-10-CM | POA: Diagnosis not present

## 2018-10-05 DIAGNOSIS — E43 Unspecified severe protein-calorie malnutrition: Secondary | ICD-10-CM | POA: Diagnosis not present

## 2018-10-05 DIAGNOSIS — J449 Chronic obstructive pulmonary disease, unspecified: Secondary | ICD-10-CM | POA: Diagnosis not present

## 2018-10-05 DIAGNOSIS — R400 Somnolence: Secondary | ICD-10-CM | POA: Diagnosis not present

## 2018-10-05 DIAGNOSIS — Y92009 Unspecified place in unspecified non-institutional (private) residence as the place of occurrence of the external cause: Secondary | ICD-10-CM | POA: Diagnosis not present

## 2018-10-05 DIAGNOSIS — K5900 Constipation, unspecified: Secondary | ICD-10-CM | POA: Diagnosis not present

## 2018-10-05 DIAGNOSIS — A419 Sepsis, unspecified organism: Secondary | ICD-10-CM | POA: Diagnosis not present

## 2018-10-05 DIAGNOSIS — E1122 Type 2 diabetes mellitus with diabetic chronic kidney disease: Secondary | ICD-10-CM | POA: Diagnosis not present

## 2018-10-05 DIAGNOSIS — R000 Tachycardia, unspecified: Secondary | ICD-10-CM | POA: Diagnosis not present

## 2018-10-05 DIAGNOSIS — Z791 Long term (current) use of non-steroidal anti-inflammatories (NSAID): Secondary | ICD-10-CM | POA: Diagnosis not present

## 2018-10-05 DIAGNOSIS — I493 Ventricular premature depolarization: Secondary | ICD-10-CM | POA: Diagnosis not present

## 2018-10-05 DIAGNOSIS — N179 Acute kidney failure, unspecified: Secondary | ICD-10-CM | POA: Diagnosis not present

## 2018-10-05 DIAGNOSIS — R531 Weakness: Secondary | ICD-10-CM | POA: Diagnosis not present

## 2018-10-05 DIAGNOSIS — J441 Chronic obstructive pulmonary disease with (acute) exacerbation: Secondary | ICD-10-CM | POA: Diagnosis not present

## 2018-10-05 DIAGNOSIS — N400 Enlarged prostate without lower urinary tract symptoms: Secondary | ICD-10-CM | POA: Diagnosis not present

## 2018-10-05 DIAGNOSIS — Z9181 History of falling: Secondary | ICD-10-CM | POA: Diagnosis not present

## 2018-10-05 DIAGNOSIS — R4182 Altered mental status, unspecified: Secondary | ICD-10-CM | POA: Diagnosis not present

## 2018-10-05 DIAGNOSIS — J9611 Chronic respiratory failure with hypoxia: Secondary | ICD-10-CM | POA: Diagnosis not present

## 2018-10-05 DIAGNOSIS — Y998 Other external cause status: Secondary | ICD-10-CM | POA: Diagnosis not present

## 2018-10-05 DIAGNOSIS — G8929 Other chronic pain: Secondary | ICD-10-CM | POA: Diagnosis not present

## 2018-10-05 DIAGNOSIS — I959 Hypotension, unspecified: Secondary | ICD-10-CM | POA: Diagnosis not present

## 2018-10-05 DIAGNOSIS — A4189 Other specified sepsis: Secondary | ICD-10-CM | POA: Diagnosis not present

## 2018-10-05 DIAGNOSIS — D62 Acute posthemorrhagic anemia: Secondary | ICD-10-CM | POA: Diagnosis not present

## 2018-10-05 DIAGNOSIS — K219 Gastro-esophageal reflux disease without esophagitis: Secondary | ICD-10-CM | POA: Diagnosis not present

## 2018-10-05 DIAGNOSIS — D72829 Elevated white blood cell count, unspecified: Secondary | ICD-10-CM | POA: Diagnosis not present

## 2018-10-05 DIAGNOSIS — N281 Cyst of kidney, acquired: Secondary | ICD-10-CM | POA: Diagnosis not present

## 2018-10-05 DIAGNOSIS — E46 Unspecified protein-calorie malnutrition: Secondary | ICD-10-CM | POA: Diagnosis not present

## 2018-10-05 DIAGNOSIS — R69 Illness, unspecified: Secondary | ICD-10-CM | POA: Diagnosis not present

## 2018-10-05 DIAGNOSIS — K521 Toxic gastroenteritis and colitis: Secondary | ICD-10-CM | POA: Diagnosis not present

## 2018-10-05 MED ORDER — ZOLPIDEM TARTRATE 5 MG PO TABS
10.00 | ORAL_TABLET | ORAL | Status: DC
Start: 2018-10-05 — End: 2018-10-05

## 2018-10-05 MED ORDER — LAMOTRIGINE 100 MG PO TABS
100.00 | ORAL_TABLET | ORAL | Status: DC
Start: 2018-10-09 — End: 2018-10-05

## 2018-10-05 MED ORDER — ACETAMINOPHEN 325 MG PO TABS
650.00 | ORAL_TABLET | ORAL | Status: DC
Start: ? — End: 2018-10-05

## 2018-10-05 MED ORDER — ONDANSETRON HCL 4 MG/2ML IJ SOLN
4.00 | INTRAMUSCULAR | Status: DC
Start: ? — End: 2018-10-05

## 2018-10-05 MED ORDER — IPRATROPIUM-ALBUTEROL 0.5-2.5 (3) MG/3ML IN SOLN
3.00 | RESPIRATORY_TRACT | Status: DC
Start: 2018-10-08 — End: 2018-10-05

## 2018-10-05 MED ORDER — ARFORMOTEROL TARTRATE 15 MCG/2ML IN NEBU
15.00 | INHALATION_SOLUTION | RESPIRATORY_TRACT | Status: DC
Start: 2018-10-08 — End: 2018-10-05

## 2018-10-05 MED ORDER — ONDANSETRON 4 MG PO TBDP
4.00 | ORAL_TABLET | ORAL | Status: DC
Start: ? — End: 2018-10-05

## 2018-10-05 MED ORDER — HYDROCODONE-ACETAMINOPHEN 5-325 MG PO TABS
2.00 | ORAL_TABLET | ORAL | Status: DC
Start: 2018-10-05 — End: 2018-10-05

## 2018-10-05 MED ORDER — DONEPEZIL HCL 10 MG PO TABS
10.00 | ORAL_TABLET | ORAL | Status: DC
Start: 2018-10-05 — End: 2018-10-05

## 2018-10-05 MED ORDER — PANTOPRAZOLE SODIUM 40 MG IV SOLR
40.00 | INTRAVENOUS | Status: DC
Start: 2018-10-06 — End: 2018-10-05

## 2018-10-05 MED ORDER — BENZONATATE 100 MG PO CAPS
100.00 | ORAL_CAPSULE | ORAL | Status: DC
Start: ? — End: 2018-10-05

## 2018-10-05 MED ORDER — BISACODYL 5 MG PO TBEC
10.00 | DELAYED_RELEASE_TABLET | ORAL | Status: DC
Start: ? — End: 2018-10-05

## 2018-10-05 MED ORDER — BUDESONIDE 0.5 MG/2ML IN SUSP
0.50 | RESPIRATORY_TRACT | Status: DC
Start: 2018-10-08 — End: 2018-10-05

## 2018-10-05 MED ORDER — SENNOSIDES-DOCUSATE SODIUM 8.6-50 MG PO TABS
1.00 | ORAL_TABLET | ORAL | Status: DC
Start: 2018-10-08 — End: 2018-10-05

## 2018-10-05 MED ORDER — DULOXETINE HCL 30 MG PO CPEP
30.00 | ORAL_CAPSULE | ORAL | Status: DC
Start: 2018-10-05 — End: 2018-10-05

## 2018-10-05 MED ORDER — SODIUM CHLORIDE 0.9 % IV SOLN
INTRAVENOUS | Status: DC
Start: ? — End: 2018-10-05

## 2018-10-05 MED ORDER — POLYETHYLENE GLYCOL 3350 17 G PO PACK
17.00 | PACK | ORAL | Status: DC
Start: 2018-10-09 — End: 2018-10-05

## 2018-10-05 MED ORDER — HEPARIN SODIUM (PORCINE) 5000 UNIT/ML IJ SOLN
5000.00 | INTRAMUSCULAR | Status: DC
Start: 2018-10-05 — End: 2018-10-05

## 2018-10-05 MED ORDER — NICOTINE 14 MG/24HR TD PT24
1.00 | MEDICATED_PATCH | TRANSDERMAL | Status: DC
Start: 2018-10-09 — End: 2018-10-05

## 2018-10-05 MED ORDER — LAMOTRIGINE 25 MG PO TABS
50.00 | ORAL_TABLET | ORAL | Status: DC
Start: 2018-10-08 — End: 2018-10-05

## 2018-10-05 MED ORDER — CLONIDINE HCL 0.1 MG PO TABS
0.10 | ORAL_TABLET | ORAL | Status: DC
Start: ? — End: 2018-10-05

## 2018-10-05 MED ORDER — MAGNESIUM HYDROXIDE 400 MG/5ML PO SUSP
30.00 | ORAL | Status: DC
Start: 2018-10-08 — End: 2018-10-05

## 2018-10-05 NOTE — Telephone Encounter (Signed)
Pt's wife called - pt was taken to [facility] facility in [place]. As per Ms. Leda Gauze, pt got worse over the weekend. Blood work was completed at the facility and it showed that pt's kidney functions are damaged. She wanted provider to be aware of the update.

## 2018-10-06 DIAGNOSIS — G8929 Other chronic pain: Secondary | ICD-10-CM | POA: Diagnosis not present

## 2018-10-06 DIAGNOSIS — E43 Unspecified severe protein-calorie malnutrition: Secondary | ICD-10-CM | POA: Diagnosis not present

## 2018-10-06 DIAGNOSIS — K219 Gastro-esophageal reflux disease without esophagitis: Secondary | ICD-10-CM | POA: Diagnosis not present

## 2018-10-06 DIAGNOSIS — H449 Unspecified disorder of globe: Secondary | ICD-10-CM | POA: Diagnosis not present

## 2018-10-06 DIAGNOSIS — N179 Acute kidney failure, unspecified: Secondary | ICD-10-CM | POA: Diagnosis not present

## 2018-10-06 DIAGNOSIS — S2242XA Multiple fractures of ribs, left side, initial encounter for closed fracture: Secondary | ICD-10-CM | POA: Diagnosis not present

## 2018-10-06 DIAGNOSIS — R69 Illness, unspecified: Secondary | ICD-10-CM | POA: Diagnosis not present

## 2018-10-06 DIAGNOSIS — W19XXXA Unspecified fall, initial encounter: Secondary | ICD-10-CM | POA: Diagnosis not present

## 2018-10-06 DIAGNOSIS — N183 Chronic kidney disease, stage 3 (moderate): Secondary | ICD-10-CM | POA: Diagnosis not present

## 2018-10-06 NOTE — Telephone Encounter (Signed)
Noted, reviewed records, looks like he suffered fall and broken ribs, will monitor as needed pending hospital f/u

## 2018-10-08 DIAGNOSIS — N183 Chronic kidney disease, stage 3 (moderate): Secondary | ICD-10-CM | POA: Diagnosis not present

## 2018-10-08 DIAGNOSIS — A419 Sepsis, unspecified organism: Secondary | ICD-10-CM | POA: Diagnosis not present

## 2018-10-08 DIAGNOSIS — I129 Hypertensive chronic kidney disease with stage 1 through stage 4 chronic kidney disease, or unspecified chronic kidney disease: Secondary | ICD-10-CM | POA: Diagnosis not present

## 2018-10-08 DIAGNOSIS — E1122 Type 2 diabetes mellitus with diabetic chronic kidney disease: Secondary | ICD-10-CM | POA: Diagnosis not present

## 2018-10-08 DIAGNOSIS — N179 Acute kidney failure, unspecified: Secondary | ICD-10-CM | POA: Diagnosis not present

## 2018-10-08 DIAGNOSIS — Z791 Long term (current) use of non-steroidal anti-inflammatories (NSAID): Secondary | ICD-10-CM | POA: Diagnosis not present

## 2018-10-08 DIAGNOSIS — J690 Pneumonitis due to inhalation of food and vomit: Secondary | ICD-10-CM | POA: Diagnosis not present

## 2018-10-08 DIAGNOSIS — D638 Anemia in other chronic diseases classified elsewhere: Secondary | ICD-10-CM | POA: Diagnosis not present

## 2018-10-08 DIAGNOSIS — D62 Acute posthemorrhagic anemia: Secondary | ICD-10-CM | POA: Diagnosis not present

## 2018-10-08 DIAGNOSIS — Z7951 Long term (current) use of inhaled steroids: Secondary | ICD-10-CM | POA: Diagnosis not present

## 2018-10-08 MED ORDER — HYDROCODONE-ACETAMINOPHEN 5-325 MG PO TABS
1.00 | ORAL_TABLET | ORAL | Status: DC
Start: 2018-10-08 — End: 2018-10-08

## 2018-10-08 MED ORDER — GENERIC EXTERNAL MEDICATION
1.00 | Status: DC
Start: 2018-10-09 — End: 2018-10-08

## 2018-10-08 MED ORDER — MELATONIN 3 MG PO TABS
6.00 | ORAL_TABLET | ORAL | Status: DC
Start: 2018-10-08 — End: 2018-10-08

## 2018-10-08 MED ORDER — GUAIFENESIN 100 MG/5ML PO SYRP
400.00 | ORAL_SOLUTION | ORAL | Status: DC
Start: 2018-10-08 — End: 2018-10-08

## 2018-10-08 MED ORDER — PANTOPRAZOLE SODIUM 40 MG IV SOLR
40.00 | INTRAVENOUS | Status: DC
Start: 2018-10-08 — End: 2018-10-08

## 2018-10-08 MED ORDER — K-PHOS-NEUTRAL 155-852-130 MG PO TABS
500.00 | ORAL_TABLET | ORAL | Status: DC
Start: 2018-10-08 — End: 2018-10-08

## 2018-10-08 MED ORDER — MIRTAZAPINE 15 MG PO TABS
7.50 | ORAL_TABLET | ORAL | Status: DC
Start: 2018-10-08 — End: 2018-10-08

## 2018-10-08 MED ORDER — ZOLPIDEM TARTRATE 5 MG PO TABS
5.00 | ORAL_TABLET | ORAL | Status: DC
Start: ? — End: 2018-10-08

## 2018-10-08 MED ORDER — SUCRALFATE 1 GM/10ML PO SUSP
1.00 | ORAL | Status: DC
Start: 2018-10-08 — End: 2018-10-08

## 2018-10-09 DIAGNOSIS — R279 Unspecified lack of coordination: Secondary | ICD-10-CM | POA: Diagnosis not present

## 2018-10-09 DIAGNOSIS — R531 Weakness: Secondary | ICD-10-CM | POA: Diagnosis not present

## 2018-10-09 DIAGNOSIS — R000 Tachycardia, unspecified: Secondary | ICD-10-CM | POA: Diagnosis not present

## 2018-10-09 DIAGNOSIS — Y92009 Unspecified place in unspecified non-institutional (private) residence as the place of occurrence of the external cause: Secondary | ICD-10-CM | POA: Diagnosis not present

## 2018-10-09 DIAGNOSIS — R29810 Facial weakness: Secondary | ICD-10-CM | POA: Diagnosis not present

## 2018-10-09 DIAGNOSIS — R5381 Other malaise: Secondary | ICD-10-CM | POA: Diagnosis not present

## 2018-10-09 DIAGNOSIS — R7989 Other specified abnormal findings of blood chemistry: Secondary | ICD-10-CM | POA: Diagnosis not present

## 2018-10-09 DIAGNOSIS — I501 Left ventricular failure: Secondary | ICD-10-CM | POA: Diagnosis not present

## 2018-10-09 DIAGNOSIS — I4510 Unspecified right bundle-branch block: Secondary | ICD-10-CM | POA: Diagnosis not present

## 2018-10-09 DIAGNOSIS — N179 Acute kidney failure, unspecified: Secondary | ICD-10-CM | POA: Diagnosis not present

## 2018-10-09 DIAGNOSIS — R410 Disorientation, unspecified: Secondary | ICD-10-CM | POA: Diagnosis not present

## 2018-10-09 DIAGNOSIS — D649 Anemia, unspecified: Secondary | ICD-10-CM | POA: Diagnosis not present

## 2018-10-09 DIAGNOSIS — I493 Ventricular premature depolarization: Secondary | ICD-10-CM | POA: Diagnosis not present

## 2018-10-09 DIAGNOSIS — N183 Chronic kidney disease, stage 3 (moderate): Secondary | ICD-10-CM | POA: Diagnosis not present

## 2018-10-09 DIAGNOSIS — J690 Pneumonitis due to inhalation of food and vomit: Secondary | ICD-10-CM | POA: Diagnosis not present

## 2018-10-09 DIAGNOSIS — I959 Hypotension, unspecified: Secondary | ICD-10-CM | POA: Diagnosis not present

## 2018-10-09 DIAGNOSIS — A419 Sepsis, unspecified organism: Secondary | ICD-10-CM | POA: Diagnosis not present

## 2018-10-09 DIAGNOSIS — D72829 Elevated white blood cell count, unspecified: Secondary | ICD-10-CM | POA: Diagnosis not present

## 2018-10-09 DIAGNOSIS — I10 Essential (primary) hypertension: Secondary | ICD-10-CM | POA: Diagnosis not present

## 2018-10-09 DIAGNOSIS — S2242XA Multiple fractures of ribs, left side, initial encounter for closed fracture: Secondary | ICD-10-CM | POA: Diagnosis not present

## 2018-10-09 DIAGNOSIS — J9611 Chronic respiratory failure with hypoxia: Secondary | ICD-10-CM | POA: Diagnosis not present

## 2018-10-09 DIAGNOSIS — A4189 Other specified sepsis: Secondary | ICD-10-CM | POA: Diagnosis not present

## 2018-10-09 DIAGNOSIS — R69 Illness, unspecified: Secondary | ICD-10-CM | POA: Diagnosis not present

## 2018-10-09 DIAGNOSIS — R001 Bradycardia, unspecified: Secondary | ICD-10-CM | POA: Diagnosis not present

## 2018-10-09 DIAGNOSIS — K219 Gastro-esophageal reflux disease without esophagitis: Secondary | ICD-10-CM | POA: Diagnosis not present

## 2018-10-09 DIAGNOSIS — R918 Other nonspecific abnormal finding of lung field: Secondary | ICD-10-CM | POA: Diagnosis not present

## 2018-10-09 DIAGNOSIS — Z743 Need for continuous supervision: Secondary | ICD-10-CM | POA: Diagnosis not present

## 2018-10-09 DIAGNOSIS — R930 Abnormal findings on diagnostic imaging of skull and head, not elsewhere classified: Secondary | ICD-10-CM | POA: Diagnosis not present

## 2018-10-09 DIAGNOSIS — J449 Chronic obstructive pulmonary disease, unspecified: Secondary | ICD-10-CM | POA: Diagnosis not present

## 2018-10-09 DIAGNOSIS — G253 Myoclonus: Secondary | ICD-10-CM | POA: Diagnosis not present

## 2018-10-09 DIAGNOSIS — Z9181 History of falling: Secondary | ICD-10-CM | POA: Diagnosis not present

## 2018-10-09 DIAGNOSIS — I440 Atrioventricular block, first degree: Secondary | ICD-10-CM | POA: Diagnosis not present

## 2018-10-09 DIAGNOSIS — W19XXXD Unspecified fall, subsequent encounter: Secondary | ICD-10-CM | POA: Diagnosis not present

## 2018-10-09 DIAGNOSIS — R400 Somnolence: Secondary | ICD-10-CM | POA: Diagnosis not present

## 2018-10-09 DIAGNOSIS — Y998 Other external cause status: Secondary | ICD-10-CM | POA: Diagnosis not present

## 2018-10-09 DIAGNOSIS — G459 Transient cerebral ischemic attack, unspecified: Secondary | ICD-10-CM | POA: Diagnosis not present

## 2018-10-09 DIAGNOSIS — D509 Iron deficiency anemia, unspecified: Secondary | ICD-10-CM | POA: Diagnosis not present

## 2018-10-09 DIAGNOSIS — K5900 Constipation, unspecified: Secondary | ICD-10-CM | POA: Diagnosis not present

## 2018-10-09 DIAGNOSIS — Z8673 Personal history of transient ischemic attack (TIA), and cerebral infarction without residual deficits: Secondary | ICD-10-CM | POA: Diagnosis not present

## 2018-10-09 DIAGNOSIS — G9340 Encephalopathy, unspecified: Secondary | ICD-10-CM | POA: Diagnosis not present

## 2018-10-09 DIAGNOSIS — R4182 Altered mental status, unspecified: Secondary | ICD-10-CM | POA: Diagnosis not present

## 2018-10-09 DIAGNOSIS — W010XXA Fall on same level from slipping, tripping and stumbling without subsequent striking against object, initial encounter: Secondary | ICD-10-CM | POA: Diagnosis not present

## 2018-10-09 DIAGNOSIS — J698 Pneumonitis due to inhalation of other solids and liquids: Secondary | ICD-10-CM | POA: Diagnosis not present

## 2018-10-09 DIAGNOSIS — S2242XD Multiple fractures of ribs, left side, subsequent encounter for fracture with routine healing: Secondary | ICD-10-CM | POA: Diagnosis not present

## 2018-10-09 DIAGNOSIS — E1122 Type 2 diabetes mellitus with diabetic chronic kidney disease: Secondary | ICD-10-CM | POA: Diagnosis not present

## 2018-10-09 DIAGNOSIS — E1159 Type 2 diabetes mellitus with other circulatory complications: Secondary | ICD-10-CM | POA: Diagnosis not present

## 2018-10-10 DIAGNOSIS — I501 Left ventricular failure: Secondary | ICD-10-CM | POA: Diagnosis not present

## 2018-10-10 DIAGNOSIS — G459 Transient cerebral ischemic attack, unspecified: Secondary | ICD-10-CM | POA: Diagnosis not present

## 2018-10-10 DIAGNOSIS — J9611 Chronic respiratory failure with hypoxia: Secondary | ICD-10-CM | POA: Diagnosis not present

## 2018-10-10 DIAGNOSIS — R7989 Other specified abnormal findings of blood chemistry: Secondary | ICD-10-CM | POA: Diagnosis not present

## 2018-10-10 DIAGNOSIS — D509 Iron deficiency anemia, unspecified: Secondary | ICD-10-CM | POA: Diagnosis not present

## 2018-10-10 DIAGNOSIS — J449 Chronic obstructive pulmonary disease, unspecified: Secondary | ICD-10-CM | POA: Diagnosis not present

## 2018-10-10 DIAGNOSIS — D649 Anemia, unspecified: Secondary | ICD-10-CM | POA: Diagnosis not present

## 2018-10-10 DIAGNOSIS — K5900 Constipation, unspecified: Secondary | ICD-10-CM | POA: Diagnosis not present

## 2018-10-10 DIAGNOSIS — A4189 Other specified sepsis: Secondary | ICD-10-CM | POA: Diagnosis not present

## 2018-10-10 DIAGNOSIS — N179 Acute kidney failure, unspecified: Secondary | ICD-10-CM | POA: Diagnosis not present

## 2018-10-10 DIAGNOSIS — J690 Pneumonitis due to inhalation of food and vomit: Secondary | ICD-10-CM | POA: Diagnosis not present

## 2018-10-10 DIAGNOSIS — R69 Illness, unspecified: Secondary | ICD-10-CM | POA: Diagnosis not present

## 2018-10-11 DIAGNOSIS — D649 Anemia, unspecified: Secondary | ICD-10-CM | POA: Diagnosis not present

## 2018-10-11 DIAGNOSIS — K5900 Constipation, unspecified: Secondary | ICD-10-CM | POA: Diagnosis not present

## 2018-10-11 DIAGNOSIS — A4189 Other specified sepsis: Secondary | ICD-10-CM | POA: Diagnosis not present

## 2018-10-11 DIAGNOSIS — R69 Illness, unspecified: Secondary | ICD-10-CM | POA: Diagnosis not present

## 2018-10-11 DIAGNOSIS — R7989 Other specified abnormal findings of blood chemistry: Secondary | ICD-10-CM | POA: Diagnosis not present

## 2018-10-11 DIAGNOSIS — J9611 Chronic respiratory failure with hypoxia: Secondary | ICD-10-CM | POA: Diagnosis not present

## 2018-10-11 DIAGNOSIS — J690 Pneumonitis due to inhalation of food and vomit: Secondary | ICD-10-CM | POA: Diagnosis not present

## 2018-10-11 DIAGNOSIS — J449 Chronic obstructive pulmonary disease, unspecified: Secondary | ICD-10-CM | POA: Diagnosis not present

## 2018-10-11 DIAGNOSIS — G459 Transient cerebral ischemic attack, unspecified: Secondary | ICD-10-CM | POA: Diagnosis not present

## 2018-10-11 DIAGNOSIS — N179 Acute kidney failure, unspecified: Secondary | ICD-10-CM | POA: Diagnosis not present

## 2018-10-12 DIAGNOSIS — R7989 Other specified abnormal findings of blood chemistry: Secondary | ICD-10-CM | POA: Diagnosis not present

## 2018-10-12 DIAGNOSIS — J690 Pneumonitis due to inhalation of food and vomit: Secondary | ICD-10-CM | POA: Diagnosis not present

## 2018-10-12 DIAGNOSIS — Z8673 Personal history of transient ischemic attack (TIA), and cerebral infarction without residual deficits: Secondary | ICD-10-CM | POA: Diagnosis not present

## 2018-10-12 DIAGNOSIS — G253 Myoclonus: Secondary | ICD-10-CM | POA: Diagnosis not present

## 2018-10-12 DIAGNOSIS — R001 Bradycardia, unspecified: Secondary | ICD-10-CM | POA: Diagnosis not present

## 2018-10-12 DIAGNOSIS — D509 Iron deficiency anemia, unspecified: Secondary | ICD-10-CM | POA: Diagnosis not present

## 2018-10-13 DIAGNOSIS — R930 Abnormal findings on diagnostic imaging of skull and head, not elsewhere classified: Secondary | ICD-10-CM | POA: Diagnosis not present

## 2018-10-13 DIAGNOSIS — R4182 Altered mental status, unspecified: Secondary | ICD-10-CM | POA: Diagnosis not present

## 2018-10-13 DIAGNOSIS — R400 Somnolence: Secondary | ICD-10-CM | POA: Diagnosis not present

## 2018-10-13 DIAGNOSIS — R7989 Other specified abnormal findings of blood chemistry: Secondary | ICD-10-CM | POA: Diagnosis not present

## 2018-10-13 DIAGNOSIS — I440 Atrioventricular block, first degree: Secondary | ICD-10-CM | POA: Diagnosis not present

## 2018-10-13 DIAGNOSIS — J698 Pneumonitis due to inhalation of other solids and liquids: Secondary | ICD-10-CM | POA: Diagnosis not present

## 2018-10-13 DIAGNOSIS — I4510 Unspecified right bundle-branch block: Secondary | ICD-10-CM | POA: Diagnosis not present

## 2018-10-13 DIAGNOSIS — D509 Iron deficiency anemia, unspecified: Secondary | ICD-10-CM | POA: Diagnosis not present

## 2018-10-13 DIAGNOSIS — Z8673 Personal history of transient ischemic attack (TIA), and cerebral infarction without residual deficits: Secondary | ICD-10-CM | POA: Diagnosis not present

## 2018-10-14 DIAGNOSIS — R7989 Other specified abnormal findings of blood chemistry: Secondary | ICD-10-CM | POA: Diagnosis not present

## 2018-10-14 DIAGNOSIS — J698 Pneumonitis due to inhalation of other solids and liquids: Secondary | ICD-10-CM | POA: Diagnosis not present

## 2018-10-14 DIAGNOSIS — D509 Iron deficiency anemia, unspecified: Secondary | ICD-10-CM | POA: Diagnosis not present

## 2018-10-14 DIAGNOSIS — Z8673 Personal history of transient ischemic attack (TIA), and cerebral infarction without residual deficits: Secondary | ICD-10-CM | POA: Diagnosis not present

## 2018-10-15 DIAGNOSIS — A419 Sepsis, unspecified organism: Secondary | ICD-10-CM | POA: Diagnosis not present

## 2018-10-15 DIAGNOSIS — G459 Transient cerebral ischemic attack, unspecified: Secondary | ICD-10-CM | POA: Diagnosis not present

## 2018-10-15 DIAGNOSIS — R531 Weakness: Secondary | ICD-10-CM | POA: Diagnosis not present

## 2018-10-15 DIAGNOSIS — N183 Chronic kidney disease, stage 3 (moderate): Secondary | ICD-10-CM | POA: Diagnosis not present

## 2018-10-15 DIAGNOSIS — R279 Unspecified lack of coordination: Secondary | ICD-10-CM | POA: Diagnosis not present

## 2018-10-15 DIAGNOSIS — Z01812 Encounter for preprocedural laboratory examination: Secondary | ICD-10-CM | POA: Diagnosis not present

## 2018-10-15 DIAGNOSIS — Z743 Need for continuous supervision: Secondary | ICD-10-CM | POA: Diagnosis not present

## 2018-10-15 DIAGNOSIS — J449 Chronic obstructive pulmonary disease, unspecified: Secondary | ICD-10-CM | POA: Diagnosis not present

## 2018-10-15 DIAGNOSIS — S2242XD Multiple fractures of ribs, left side, subsequent encounter for fracture with routine healing: Secondary | ICD-10-CM | POA: Diagnosis not present

## 2018-10-15 DIAGNOSIS — J690 Pneumonitis due to inhalation of food and vomit: Secondary | ICD-10-CM | POA: Diagnosis not present

## 2018-10-15 DIAGNOSIS — J9611 Chronic respiratory failure with hypoxia: Secondary | ICD-10-CM | POA: Diagnosis not present

## 2018-10-15 DIAGNOSIS — E1159 Type 2 diabetes mellitus with other circulatory complications: Secondary | ICD-10-CM | POA: Diagnosis not present

## 2018-10-15 DIAGNOSIS — R5381 Other malaise: Secondary | ICD-10-CM | POA: Diagnosis not present

## 2018-10-15 DIAGNOSIS — I10 Essential (primary) hypertension: Secondary | ICD-10-CM | POA: Diagnosis not present

## 2018-10-15 DIAGNOSIS — N179 Acute kidney failure, unspecified: Secondary | ICD-10-CM | POA: Diagnosis not present

## 2018-10-15 DIAGNOSIS — R69 Illness, unspecified: Secondary | ICD-10-CM | POA: Diagnosis not present

## 2018-10-15 DIAGNOSIS — D649 Anemia, unspecified: Secondary | ICD-10-CM | POA: Diagnosis not present

## 2018-10-16 DIAGNOSIS — G459 Transient cerebral ischemic attack, unspecified: Secondary | ICD-10-CM | POA: Diagnosis not present

## 2018-10-16 DIAGNOSIS — N179 Acute kidney failure, unspecified: Secondary | ICD-10-CM | POA: Diagnosis not present

## 2018-10-16 DIAGNOSIS — D649 Anemia, unspecified: Secondary | ICD-10-CM | POA: Diagnosis not present

## 2018-10-16 DIAGNOSIS — J690 Pneumonitis due to inhalation of food and vomit: Secondary | ICD-10-CM | POA: Diagnosis not present

## 2018-10-16 DIAGNOSIS — Z01812 Encounter for preprocedural laboratory examination: Secondary | ICD-10-CM | POA: Diagnosis not present

## 2018-10-16 DIAGNOSIS — A419 Sepsis, unspecified organism: Secondary | ICD-10-CM | POA: Diagnosis not present

## 2018-10-18 DIAGNOSIS — J209 Acute bronchitis, unspecified: Secondary | ICD-10-CM | POA: Diagnosis not present

## 2018-10-18 DIAGNOSIS — J438 Other emphysema: Secondary | ICD-10-CM | POA: Diagnosis not present

## 2018-10-18 DIAGNOSIS — R062 Wheezing: Secondary | ICD-10-CM | POA: Diagnosis not present

## 2018-10-18 DIAGNOSIS — J449 Chronic obstructive pulmonary disease, unspecified: Secondary | ICD-10-CM | POA: Diagnosis not present

## 2018-10-21 DIAGNOSIS — M1612 Unilateral primary osteoarthritis, left hip: Secondary | ICD-10-CM | POA: Diagnosis not present

## 2018-10-21 DIAGNOSIS — G894 Chronic pain syndrome: Secondary | ICD-10-CM | POA: Diagnosis not present

## 2018-10-21 DIAGNOSIS — M19019 Primary osteoarthritis, unspecified shoulder: Secondary | ICD-10-CM | POA: Diagnosis not present

## 2018-10-21 DIAGNOSIS — M5136 Other intervertebral disc degeneration, lumbar region: Secondary | ICD-10-CM | POA: Diagnosis not present

## 2018-10-23 ENCOUNTER — Other Ambulatory Visit: Payer: Self-pay | Admitting: Osteopathic Medicine

## 2018-10-26 ENCOUNTER — Inpatient Hospital Stay: Payer: Medicare HMO | Admitting: Osteopathic Medicine

## 2018-11-09 ENCOUNTER — Other Ambulatory Visit: Payer: Self-pay | Admitting: Osteopathic Medicine

## 2018-11-11 ENCOUNTER — Encounter: Payer: Self-pay | Admitting: Osteopathic Medicine

## 2018-11-11 ENCOUNTER — Ambulatory Visit (INDEPENDENT_AMBULATORY_CARE_PROVIDER_SITE_OTHER): Payer: Medicare HMO | Admitting: Osteopathic Medicine

## 2018-11-11 DIAGNOSIS — J441 Chronic obstructive pulmonary disease with (acute) exacerbation: Secondary | ICD-10-CM

## 2018-11-11 MED ORDER — LEVOFLOXACIN 500 MG PO TABS: 500.0000 mg | ORAL_TABLET | Freq: Every day | ORAL | 0 refills | Status: DC

## 2018-11-11 MED ORDER — PREDNISONE 50 MG PO TABS: ORAL_TABLET | ORAL | 0 refills | Status: DC

## 2018-11-11 NOTE — Progress Notes (Signed)
Virtual Visit via Phone I connected with      Tyler ABDELAZIZ Sr. on 11/11/18 at 1:40 PM by a telemedicine application and verified that am PM: speaking with the correct person using two identifiers.  Patient is at home I am in office    I discussed the limitations of evaluation and management by telemedicine and the availability of in person appointments. The patient expressed understanding and agreed to proceed.  History of Present Illness: Tyler Gail. is a 78 y.o. male who would like to discuss COPD   Breathing issues have been a bit worse over the past couple of days to the point where he is coughing up large globs of mucus, feeling more tired than usual.  Hospitalized last month for fall with broken ribs, aspiration pneumonia.  Denies fever.  Has been using trilogy consistently but has not been using albuterol.        Observations/Objective: There were no vitals taken for this visit. BP Readings from Last 3 Encounters:  03/18/18 120/60  02/27/18 118/76  10/21/17 114/75   Exam: Normal Speech.  NAD  Lab and Radiology Results No results found for this or any previous visit (from the past 72 hour(s)). No results found.     Assessment and Plan: 78 y.o. male with The encounter diagnosis was COPD with acute exacerbation ([place]).  Advised wife to bring him to the office for physical exam, probably we at least need an x-ray plus or minus some blood work as well.  They declined to come into the office.  I think we would be reasonable to try treating for potential pneumonia and COPD exacerbation with precautions to come see me or go to urgent care/ER if significantly worse.  I advised that we are working from incomplete information given that this is a phone visit and that they have declined to come to the office for further evaluation, patient and his wife accept risks and agreed to plan.   PDMP not reviewed this encounter. No orders of the defined types were  placed in this encounter.  Meds ordered this encounter  Medications  . levofloxacin (LEVAQUIN) 500 MG tablet    Sig: Take 1 tablet (500 mg total) by mouth daily.    Dispense:  7 tablet    Refill:  0  . predniSONE (DELTASONE) 50 MG tablet    Sig: One tab PO daily for 5 days.    Dispense:  5 tablet    Refill:  0     Follow Up Instructions: Return if symptoms worsen or fail to improve.    I discussed the assessment and treatment plan with the patient. The patient was provided an opportunity to ask questions and all were answered. The patient agreed with the plan and demonstrated an understanding of the instructions.   The patient was advised to call back or seek an in-person evaluation if any new concerns, if symptoms worsen or if the condition fails to improve as anticipated.  25 minutes of non-face-to-face time was provided during this encounter.                      Historical information moved to improve visibility of documentation.  Past Medical History:  Diagnosis Date  . Acute encephalopathy 12/28/2015  . Allergy   . Anxiety   . Asthma   . Benign prostatic hypertrophy   . CAD (coronary artery disease)    a. s/p multiple PCIs to RCA and  eventual CABG in 1999;  b.LHC 11/2003: CFX 95%, RCA occluded, SVG-RCA occluded, SVG-OM patent, native LAD patent. EF was 55%.  Patient had left to right collaterals and medical therapy was recommended    . Chronic kidney disease   . COPD (chronic obstructive pulmonary disease) ([place])   . DDD (degenerative disc disease), lumbar   . Dementia ([place])   . Depression   . Diabetes mellitus    on no medications  . Diverticulitis, colon   . Emphysema of lung ([place])   . Esophageal stricture   . GERD (gastroesophageal reflux disease)   . Herniated disc   . Hiatal hernia   . History of colonic polyps   . HOH (hard of hearing)   . Hx of colonoscopy   . Hyperlipidemia   . Hypertension   . Insomnia   . Iron deficiency anemia    . Lumbar back pain    chronic  . Memory loss   . OA (osteoarthritis)   . OSA (obstructive sleep apnea)    wife denies this  . Pneumonia   . PVD (peripheral vascular disease) ([place])   . Shortness of breath dyspnea    on home O2  . Spinal stenosis    Past Surgical History:  Procedure Laterality Date  . bilateral knee replacements    . CARPAL TUNNEL RELEASE    . CIRCUMCISION    . COLONOSCOPY N/A 10/13/2015   Procedure: COLONOSCOPY;  Surgeon: Irene Shipper, MD;  Location[facility] ENDOSCOPY;  Service: Endoscopy;  Laterality: N/A;  . CORONARY ANGIOPLASTY WITH STENT PLACEMENT  2/99  . CORONARY ARTERY BYPASS GRAFT  09/1997   double bypss  . EYE SURGERY  06/2011   " eye muscle   . heart bypass    . HEMORRHOID SURGERY    . SHOULDER ARTHROSCOPY  right  . STRABISMUS SURGERY  08/29/2011   Procedure: REPAIR STRABISMUS;  Surgeon: Derry Skill, MD;  Location: [place];  Service: Ophthalmology;  Laterality: Left;   Social History   Tobacco Use  . Smoking status: Current Every Day Smoker    Packs/day: 1.00    Years: 15.00    Pack years: 15.00    Types: Cigarettes    Start date: 02/14/1948  . Smokeless tobacco: Never Used  . Tobacco comment: Peak rate of 3ppd  Substance Use Topics  . Alcohol use: No    Alcohol/week: 0.0 standard drinks    Comment: none for 20 years    family history includes Allergies in his unknown relative; Breast cancer in his daughter; Diabetes in his unknown relative; Emphysema in his father; Heart disease in his unknown relative; Hyperlipidemia in his unknown relative; Hypertension in his unknown relative; Kidney cancer in his daughter; Stroke in his father.  Medications: Current Outpatient Medications  Medication Sig Dispense Refill  . benzonatate (TESSALON) 200 MG capsule TAKE 1 CAPSULE BY MOUTH 3 TIMES DAILY AS NEEDED FOR COUGH. 90 capsule 3  . donepezil (ARICEPT) 10 MG tablet TAKE 1 TABLET BY MOUTH AT BEDTIME. 90 tablet 3  . DULoxetine (CYMBALTA) 30 MG capsule Take  1 capsule (30 mg total) by mouth daily. 30 capsule 1  . fluticasone (FLONASE) 50 MCG/ACT nasal spray USE 2 SPRAYS IN EACH NOSTRIL EVERY DAY 48 g 3  . HYDROcodone-acetaminophen (NORCO) 10-325 MG tablet Take 1 tablet by mouth every 4 (four) hours as needed.    Marland Kitchen ipratropium (ATROVENT) 0.06 % nasal spray Place 2 sprays into both nostrils 4 (four) times daily. 15 mL  1  . ketoconazole (NIZORAL) 2 % shampoo APPLY TOPICALLY TWICE WEEKLY 120 mL 1  . lamoTRIgine (LAMICTAL) 100 MG tablet Take 50-100 mg by mouth 2 (two) times daily. 50mg  in the morning, and 100mg  in the evening    . loratadine (CLARITIN) 10 MG tablet TAKE 1 TABLET BY MOUTH DAILY. *NEEDS OFFICE VISIT FOR FURTHER REFILLS* 90 tablet 1  . mirtazapine (REMERON) 7.5 MG tablet TAKE 1 TABLET BY MOUTH AT BEDTIME. 30 tablet 1  . Multiple Vitamin (MULTIVITAMIN) tablet Take 1 tablet by mouth daily.    . nitroGLYCERIN (NITROSTAT) 0.4 MG SL tablet Place 1 tablet (0.4 mg total) every 5 (five) minutes as needed under the tongue. For chest pain 25 tablet 6  . omeprazole (PRILOSEC) 20 MG capsule TAKE 1 CAPSULE BY MOUTH 2 TIMES DAILY BEFORE A MEAL. 120 capsule 11  . OXYGEN Inhale 2.5 L into the lungs at bedtime.    . pravastatin (PRAVACHOL) 40 MG tablet TAKE 1 TABLET BY MOUTH DAILY. 90 tablet 3  . predniSONE (DELTASONE) 50 MG tablet One tab PO daily for 5 days. 5 tablet 0  . TRELEGY ELLIPTA 100-62.5-25 MCG/INH AEPB SHAKE WELL AND INHALE 1 PUFF INTO THE LUNGS DAILY  11  . zolpidem (AMBIEN) 10 MG tablet Take 0.5 tablets (5 mg total) by mouth at bedtime as needed for sleep.    Marland Kitchen albuterol (PROVENTIL HFA;VENTOLIN HFA) 108 (90 Base) MCG/ACT inhaler Inhale 1-2 puffs into the lungs every 4 (four) hours as needed for wheezing or shortness of breath. 2 Inhaler 12  . levofloxacin (LEVAQUIN) 500 MG tablet Take 1 tablet (500 mg total) by mouth daily. 7 tablet 0   No current facility-administered medications for this visit.    Allergies  Allergen Reactions  . Morphine  Itching    REACTION: itching  . Celecoxib Nausea And Vomiting    REACTION: GI upset  . Chicken Protein Other (See Comments)    Will not eat chicken  . Fish Allergy Other (See Comments)    Patient will not eat, does NOT like  . Nalbuphine Other (See Comments)    REACTION: contraindication with oxycontin.  . Prednisone Other (See Comments)    REACTION: "yeast infections" Yeast infection.  . Venlafaxine Other (See Comments)    REACTION: GI upset  . Chicken Allergy Other (See Comments)    Will not eat chicken  . Fish-Derived Products Other (See Comments)    Will not eat seafood of any kind    PDMP not reviewed this encounter. No orders of the defined types were placed in this encounter.  Meds ordered this encounter  Medications  . levofloxacin (LEVAQUIN) 500 MG tablet    Sig: Take 1 tablet (500 mg total) by mouth daily.    Dispense:  7 tablet    Refill:  0  . predniSONE (DELTASONE) 50 MG tablet    Sig: One tab PO daily for 5 days.    Dispense:  5 tablet    Refill:  0

## 2018-11-12 DIAGNOSIS — F411 Generalized anxiety disorder: Secondary | ICD-10-CM | POA: Diagnosis not present

## 2018-11-12 DIAGNOSIS — F0281 Dementia in other diseases classified elsewhere with behavioral disturbance: Secondary | ICD-10-CM | POA: Diagnosis not present

## 2018-11-12 DIAGNOSIS — F5101 Primary insomnia: Secondary | ICD-10-CM | POA: Diagnosis not present

## 2018-11-12 DIAGNOSIS — R69 Illness, unspecified: Secondary | ICD-10-CM | POA: Diagnosis not present

## 2018-11-17 DIAGNOSIS — M19011 Primary osteoarthritis, right shoulder: Secondary | ICD-10-CM | POA: Diagnosis not present

## 2018-11-18 DIAGNOSIS — J449 Chronic obstructive pulmonary disease, unspecified: Secondary | ICD-10-CM | POA: Diagnosis not present

## 2018-11-18 DIAGNOSIS — J209 Acute bronchitis, unspecified: Secondary | ICD-10-CM | POA: Diagnosis not present

## 2018-11-18 DIAGNOSIS — J438 Other emphysema: Secondary | ICD-10-CM | POA: Diagnosis not present

## 2018-11-18 DIAGNOSIS — R062 Wheezing: Secondary | ICD-10-CM | POA: Diagnosis not present

## 2018-12-08 ENCOUNTER — Other Ambulatory Visit: Payer: Self-pay | Admitting: Osteopathic Medicine

## 2018-12-08 NOTE — Telephone Encounter (Signed)
Requested medication (s) are due for refill today: yes  Requested medication (s) are on the active medication list: yes  Last refill: 11/09/2018  Future visit scheduled: no  Notes to clinic:  Review for refill   Requested Prescriptions  Pending Prescriptions Disp Refills   mirtazapine (REMERON) 7.5 MG tablet [Pharmacy Med Name: MIRTAZAPINE 7.5 MG TABLET 7.5 Tablet] 30 tablet 1    Sig: TAKE 1 TABLET BY MOUTH AT BEDTIME.     Psychiatry: Antidepressants - mirtazapine Failed - 12/08/2018 11:50 AM      Failed - AST in normal range and within 360 days    AST  Date Value Ref Range Status  12/30/2016 18 10 - 35 U/L Final         Failed - ALT in normal range and within 360 days    ALT  Date Value Ref Range Status  12/30/2016 9 9 - 46 U/L Final         Failed - Triglycerides in normal range and within 360 days    Triglycerides  Date Value Ref Range Status  04/04/2015 190 (H) <150 mg/dL Final         Failed - Total Cholesterol in normal range and within 360 days    Cholesterol  Date Value Ref Range Status  04/04/2015 127 125 - 200 mg/dL Final         Failed - WBC in normal range and within 360 days    WBC  Date Value Ref Range Status  12/30/2016 6.7 3.8 - 10.8 Thousand/uL Final         Passed - Valid encounter within last 6 months    Recent Outpatient Visits          3 weeks ago COPD with acute exacerbation [facility])   [facility] [facility], Lanelle Bal, DO   2 months ago Decrease in appetite   [place] [facility], Lanelle Bal, DO   3 months ago COPD exacerbation [facility])   [place] [facility], Lanelle Bal, DO   8 months ago Cough   [place] [facility], Lanelle Bal, DO   1 year ago Diabetes mellitus without complication [facility])   [place] [facility], Lanelle Bal, DO             Passed - Completed PHQ-2 or  PHQ-9 in the last 360 days.

## 2018-12-16 DIAGNOSIS — Z79891 Long term (current) use of opiate analgesic: Secondary | ICD-10-CM | POA: Diagnosis not present

## 2018-12-16 DIAGNOSIS — F411 Generalized anxiety disorder: Secondary | ICD-10-CM | POA: Diagnosis not present

## 2018-12-16 DIAGNOSIS — M19019 Primary osteoarthritis, unspecified shoulder: Secondary | ICD-10-CM | POA: Diagnosis not present

## 2018-12-16 DIAGNOSIS — R69 Illness, unspecified: Secondary | ICD-10-CM | POA: Diagnosis not present

## 2018-12-16 DIAGNOSIS — G894 Chronic pain syndrome: Secondary | ICD-10-CM | POA: Diagnosis not present

## 2018-12-16 DIAGNOSIS — F0281 Dementia in other diseases classified elsewhere with behavioral disturbance: Secondary | ICD-10-CM | POA: Diagnosis not present

## 2018-12-16 DIAGNOSIS — F5101 Primary insomnia: Secondary | ICD-10-CM | POA: Diagnosis not present

## 2018-12-16 DIAGNOSIS — M5136 Other intervertebral disc degeneration, lumbar region: Secondary | ICD-10-CM | POA: Diagnosis not present

## 2018-12-16 DIAGNOSIS — Z79899 Other long term (current) drug therapy: Secondary | ICD-10-CM | POA: Diagnosis not present

## 2018-12-16 DIAGNOSIS — M47816 Spondylosis without myelopathy or radiculopathy, lumbar region: Secondary | ICD-10-CM | POA: Diagnosis not present

## 2018-12-19 DIAGNOSIS — J209 Acute bronchitis, unspecified: Secondary | ICD-10-CM | POA: Diagnosis not present

## 2018-12-19 DIAGNOSIS — J438 Other emphysema: Secondary | ICD-10-CM | POA: Diagnosis not present

## 2018-12-19 DIAGNOSIS — R062 Wheezing: Secondary | ICD-10-CM | POA: Diagnosis not present

## 2018-12-19 DIAGNOSIS — J449 Chronic obstructive pulmonary disease, unspecified: Secondary | ICD-10-CM | POA: Diagnosis not present

## 2018-12-21 ENCOUNTER — Other Ambulatory Visit: Payer: Self-pay | Admitting: Cardiovascular Disease

## 2018-12-21 DIAGNOSIS — I2510 Atherosclerotic heart disease of native coronary artery without angina pectoris: Secondary | ICD-10-CM

## 2018-12-24 ENCOUNTER — Other Ambulatory Visit: Payer: Self-pay | Admitting: Osteopathic Medicine

## 2018-12-24 DIAGNOSIS — J449 Chronic obstructive pulmonary disease, unspecified: Secondary | ICD-10-CM

## 2018-12-31 DIAGNOSIS — G894 Chronic pain syndrome: Secondary | ICD-10-CM | POA: Diagnosis not present

## 2018-12-31 DIAGNOSIS — M48061 Spinal stenosis, lumbar region without neurogenic claudication: Secondary | ICD-10-CM | POA: Diagnosis not present

## 2018-12-31 DIAGNOSIS — R69 Illness, unspecified: Secondary | ICD-10-CM | POA: Diagnosis not present

## 2018-12-31 DIAGNOSIS — M47816 Spondylosis without myelopathy or radiculopathy, lumbar region: Secondary | ICD-10-CM | POA: Diagnosis not present

## 2018-12-31 DIAGNOSIS — M5136 Other intervertebral disc degeneration, lumbar region: Secondary | ICD-10-CM | POA: Diagnosis not present

## 2019-01-13 DIAGNOSIS — M5136 Other intervertebral disc degeneration, lumbar region: Secondary | ICD-10-CM | POA: Diagnosis not present

## 2019-01-13 DIAGNOSIS — G894 Chronic pain syndrome: Secondary | ICD-10-CM | POA: Diagnosis not present

## 2019-01-13 DIAGNOSIS — M1612 Unilateral primary osteoarthritis, left hip: Secondary | ICD-10-CM | POA: Diagnosis not present

## 2019-01-13 DIAGNOSIS — M47816 Spondylosis without myelopathy or radiculopathy, lumbar region: Secondary | ICD-10-CM | POA: Diagnosis not present

## 2019-01-18 DIAGNOSIS — R062 Wheezing: Secondary | ICD-10-CM | POA: Diagnosis not present

## 2019-01-18 DIAGNOSIS — J438 Other emphysema: Secondary | ICD-10-CM | POA: Diagnosis not present

## 2019-01-18 DIAGNOSIS — J209 Acute bronchitis, unspecified: Secondary | ICD-10-CM | POA: Diagnosis not present

## 2019-01-18 DIAGNOSIS — J449 Chronic obstructive pulmonary disease, unspecified: Secondary | ICD-10-CM | POA: Diagnosis not present

## 2019-01-19 ENCOUNTER — Telehealth: Payer: Self-pay | Admitting: Osteopathic Medicine

## 2019-01-19 DIAGNOSIS — R918 Other nonspecific abnormal finding of lung field: Secondary | ICD-10-CM

## 2019-01-19 NOTE — Telephone Encounter (Signed)
Please call patient: He is due for follow-up CT scan of the chest to monitor lung nodules.  I placed the order for this test and he should be getting a call to schedule it

## 2019-01-19 NOTE — Telephone Encounter (Signed)
-----   Message from [name], DO sent at 10/08/2018  1:42 PM EDT ----- Repeat CT follow-up nodules (see Care Everywhere)

## 2019-01-21 DIAGNOSIS — F5101 Primary insomnia: Secondary | ICD-10-CM | POA: Diagnosis not present

## 2019-01-21 DIAGNOSIS — F0281 Dementia in other diseases classified elsewhere with behavioral disturbance: Secondary | ICD-10-CM | POA: Diagnosis not present

## 2019-01-21 DIAGNOSIS — R69 Illness, unspecified: Secondary | ICD-10-CM | POA: Diagnosis not present

## 2019-01-21 DIAGNOSIS — F411 Generalized anxiety disorder: Secondary | ICD-10-CM | POA: Diagnosis not present

## 2019-02-08 ENCOUNTER — Telehealth: Payer: Self-pay

## 2019-02-08 ENCOUNTER — Other Ambulatory Visit: Payer: Self-pay | Admitting: Osteopathic Medicine

## 2019-02-08 NOTE — Telephone Encounter (Signed)
Attempted to call the patient and the phone rang 3 times and then was cut off. Will try again another time.

## 2019-02-08 NOTE — Telephone Encounter (Signed)
I think a virtual appt would be wise, more likely COPD than COVID but just to be safe. They need to go to ER if severe shortness of breath, we cannot do chest Xrays here in the [place] for any patients with cough!!

## 2019-02-08 NOTE — Telephone Encounter (Signed)
Pt's wife called stating that pt's coughing and congestion has increased. Pt is having issues with breathing due to coughing. As per pt's wife, tessalon rx no longer working. Leda Gauze wants to know should pt make a virtual appt to be evaluated? Pls advise, thanks.

## 2019-02-09 DIAGNOSIS — M19011 Primary osteoarthritis, right shoulder: Secondary | ICD-10-CM | POA: Diagnosis not present

## 2019-02-11 NOTE — Telephone Encounter (Signed)
I called the patient wife and we scheduled a  follow up with PCP on 02/12/2019 for a virtual visit with patient.

## 2019-02-12 ENCOUNTER — Other Ambulatory Visit: Payer: Self-pay

## 2019-02-12 ENCOUNTER — Ambulatory Visit (INDEPENDENT_AMBULATORY_CARE_PROVIDER_SITE_OTHER): Payer: Medicare HMO | Admitting: Osteopathic Medicine

## 2019-02-12 ENCOUNTER — Encounter: Payer: Self-pay | Admitting: Osteopathic Medicine

## 2019-02-12 VITALS — Temp 97.0°F | Wt 145.0 lb

## 2019-02-12 DIAGNOSIS — J441 Chronic obstructive pulmonary disease with (acute) exacerbation: Secondary | ICD-10-CM

## 2019-02-12 MED ORDER — PREDNISONE 50 MG PO TABS: ORAL_TABLET | ORAL | 0 refills | Status: AC

## 2019-02-12 MED ORDER — ALBUTEROL SULFATE HFA 108 (90 BASE) MCG/ACT IN AERS: 1.0000 | INHALATION_SPRAY | RESPIRATORY_TRACT | 11 refills | Status: AC | PRN

## 2019-02-12 MED ORDER — LEVOFLOXACIN 500 MG PO TABS: 500.0000 mg | ORAL_TABLET | Freq: Every day | ORAL | 0 refills | Status: AC

## 2019-02-12 NOTE — Progress Notes (Signed)
Virtual Visit via phone  I connected with      LANARD ARGUIJO Sr. on 02/12/19 at 10:44 AM  by a telemedicine application and verified that I am speaking with the correct person using two identifiers.  Patient is at home I am in office   I discussed the limitations of evaluation and management by telemedicine and the availability of in person appointments. The patient expressed understanding and agreed to proceed.  History of Present Illness: Sutter Ahlgren. is a 78 y.o. male who would like to discuss cough, short of breath  Congested, has been coughing. Cough has decreased a littlle bit since starting some Robitussin. Head and chest congestion. Feeling short of breath, which is a chronic issue but maybe a little bit worse, not much. Has an appt w/ pulmonary later this month 03/03/19. Patient is on home O2 all day. No fever.     Observations/Objective: Temp (!) 97 F (36.1 C) (Oral)   Wt 145 lb (65.8 kg)   BMI 24.13 kg/m  BP Readings from Last 3 Encounters:  03/18/18 120/60  02/27/18 118/76  10/21/17 114/75   Exam: Normal Speech.    Lab and Radiology Results No results found for this or any previous visit (from the past 72 hour(s)). No results found.     Assessment and Plan: 78 y.o. male with The encounter diagnosis was COPD with acute exacerbation ([place]).  Advised patient and wife that we can try treatment for COPD exacerbation, which has helped him in the past.  If he does not seem to be responding to the treatment over the weekend, or if he gets worse especially if increased shortness of breath or fever develops, or if any confusion or other concerning symptoms, will need to go to emergency room.  Patient and his wife verbalized understanding with this plan and are agreeable.  PDMP not reviewed this encounter. No orders of the defined types were placed in this encounter.  Meds ordered this encounter  Medications  . albuterol (VENTOLIN HFA) 108 (90 Base)  MCG/ACT inhaler    Sig: Inhale 1-2 puffs into the lungs every 4 (four) hours as needed for wheezing or shortness of breath.    Dispense:  18 g    Refill:  11  . predniSONE (DELTASONE) 50 MG tablet    Sig: One tab PO daily for 5 days.    Dispense:  5 tablet    Refill:  0  . levofloxacin (LEVAQUIN) 500 MG tablet    Sig: Take 1 tablet (500 mg total) by mouth daily.    Dispense:  7 tablet    Refill:  0      Follow Up Instructions: Return if symptoms worsen or fail to improve / pending pulmonology recommendations.    I discussed the assessment and treatment plan with the patient. The patient was provided an opportunity to ask questions and all were answered. The patient agreed with the plan and demonstrated an understanding of the instructions.   The patient was advised to call back or seek an in-person evaluation if any new concerns, if symptoms worsen or if the condition fails to improve as anticipated.  25 minutes of non-face-to-face time was provided during this encounter.      . . . . . . . . . . . . . Marland Kitchen                   Historical information moved to improve visibility of documentation.  Past Medical History:  Diagnosis Date  . Acute encephalopathy 12/28/2015  . Allergy   . Anxiety   . Asthma   . Benign prostatic hypertrophy   . CAD (coronary artery disease)    a. s/p multiple PCIs to RCA and eventual CABG in 1999;  b.LHC 11/2003: CFX 95%, RCA occluded, SVG-RCA occluded, SVG-OM patent, native LAD patent. EF was 55%.  Patient had left to right collaterals and medical therapy was recommended    . Chronic kidney disease   . COPD (chronic obstructive pulmonary disease) ([place])   . DDD (degenerative disc disease), lumbar   . Dementia ([place])   . Depression   . Diabetes mellitus    on no medications  . Diverticulitis, colon   . Emphysema of lung ([place])   . Esophageal stricture   . GERD (gastroesophageal reflux disease)   . Herniated disc   .  Hiatal hernia   . History of colonic polyps   . HOH (hard of hearing)   . Hx of colonoscopy   . Hyperlipidemia   . Hypertension   . Insomnia   . Iron deficiency anemia   . Lumbar back pain    chronic  . Memory loss   . OA (osteoarthritis)   . OSA (obstructive sleep apnea)    wife denies this  . Pneumonia   . PVD (peripheral vascular disease) ([place])   . Shortness of breath dyspnea    on home O2  . Spinal stenosis    Past Surgical History:  Procedure Laterality Date  . bilateral knee replacements    . CARPAL TUNNEL RELEASE    . CIRCUMCISION    . COLONOSCOPY N/A 10/13/2015   Procedure: COLONOSCOPY;  Surgeon: Irene Shipper, MD;  Location: [facility];  Service: Endoscopy;  Laterality: N/A;  . CORONARY ANGIOPLASTY WITH STENT PLACEMENT  2/99  . CORONARY ARTERY BYPASS GRAFT  09/1997   double bypss  . EYE SURGERY  06/2011   " eye muscle   . heart bypass    . HEMORRHOID SURGERY    . SHOULDER ARTHROSCOPY  right  . STRABISMUS SURGERY  08/29/2011   Procedure: REPAIR STRABISMUS;  Surgeon: Derry Skill, MD;  Location: [facility];  Service: Ophthalmology;  Laterality: Left;   Social History   Tobacco Use  . Smoking status: Current Every Day Smoker    Packs/day: 1.00    Years: 15.00    Pack years: 15.00    Types: Cigarettes    Start date: 02/14/1948  . Smokeless tobacco: Never Used  . Tobacco comment: Peak rate of 3ppd  Substance Use Topics  . Alcohol use: No    Alcohol/week: 0.0 standard drinks    Comment: none for 20 years    family history includes Allergies in his unknown relative; Breast cancer in his daughter; Diabetes in his unknown relative; Emphysema in his father; Heart disease in his unknown relative; Hyperlipidemia in his unknown relative; Hypertension in his unknown relative; Kidney cancer in his daughter; Stroke in his father.  Medications: Current Outpatient Medications  Medication Sig Dispense Refill  . benzonatate (TESSALON) 200 MG capsule TAKE 1 CAPSULE BY MOUTH 3  TIMES DAILY AS NEEDED FOR COUGH. 270 capsule 3  . donepezil (ARICEPT) 10 MG tablet TAKE 1 TABLET BY MOUTH AT BEDTIME. 90 tablet 3  . DULoxetine (CYMBALTA) 30 MG capsule Take 1 capsule (30 mg total) by mouth daily. 30 capsule 1  . fluticasone (FLONASE) 50 MCG/ACT nasal spray USE 2 SPRAYS IN EACH NOSTRIL  EVERY DAY 48 g 3  . HYDROcodone-acetaminophen (NORCO) 10-325 MG tablet Take 1 tablet by mouth every 4 (four) hours as needed.    Marland Kitchen ipratropium (ATROVENT) 0.06 % nasal spray Place 2 sprays into both nostrils 4 (four) times daily. 15 mL 1  . ketoconazole (NIZORAL) 2 % shampoo APPLY TOPICALLY TWICE WEEKLY 120 mL 1  . lamoTRIgine (LAMICTAL) 100 MG tablet Take 50-100 mg by mouth 2 (two) times daily. 50mg  in the morning, and 100mg  in the evening    . levofloxacin (LEVAQUIN) 500 MG tablet Take 1 tablet (500 mg total) by mouth daily. 7 tablet 0  . loratadine (CLARITIN) 10 MG tablet TAKE 1 TABLET BY MOUTH DAILY. *NEEDS OFFICE VISIT FOR FURTHER REFILLS* 90 tablet 1  . mirtazapine (REMERON) 7.5 MG tablet TAKE 1 TABLET BY MOUTH AT BEDTIME. 30 tablet 1  . Multiple Vitamin (MULTIVITAMIN) tablet Take 1 tablet by mouth daily.    . nitroGLYCERIN (NITROSTAT) 0.4 MG SL tablet DISSOLVE 1 TABLET UNDER THE TONGUE EVERY 5 MINUTES AS NEEDED FOR CHEST PAIN. IF PAIN CONTINUES AFTER 3 DOSES-CALL 911 25 tablet 6  . omeprazole (PRILOSEC) 20 MG capsule TAKE 1 CAPSULE BY MOUTH 2 TIMES DAILY BEFORE A MEAL. 120 capsule 11  . OXYGEN Inhale 2.5 L into the lungs at bedtime.    . pravastatin (PRAVACHOL) 40 MG tablet TAKE 1 TABLET BY MOUTH DAILY. 90 tablet 3  . predniSONE (DELTASONE) 50 MG tablet One tab PO daily for 5 days. 5 tablet 0  . TRELEGY ELLIPTA 100-62.5-25 MCG/INH AEPB SHAKE WELL AND INHALE 1 PUFF INTO THE LUNGS DAILY  11  . zolpidem (AMBIEN) 10 MG tablet Take 0.5 tablets (5 mg total) by mouth at bedtime as needed for sleep.    Marland Kitchen albuterol (VENTOLIN HFA) 108 (90 Base) MCG/ACT inhaler Inhale 1-2 puffs into the lungs every 4  (four) hours as needed for wheezing or shortness of breath. 18 g 11  . sertraline (ZOLOFT) 100 MG tablet      No current facility-administered medications for this visit.    Allergies  Allergen Reactions  . Morphine Itching    REACTION: itching  . Celecoxib Nausea And Vomiting    REACTION: GI upset  . Chicken Protein Other (See Comments)    Will not eat chicken  . Fish Allergy Other (See Comments)    Patient will not eat, does NOT like  . Nalbuphine Other (See Comments)    REACTION: contraindication with oxycontin.  . Prednisone Other (See Comments)    REACTION: "yeast infections" Yeast infection.  . Venlafaxine Other (See Comments)    REACTION: GI upset  . Chicken Allergy Other (See Comments)    Will not eat chicken  . Fish-Derived Products Other (See Comments)    Will not eat seafood of any kind

## 2019-02-17 DIAGNOSIS — I2510 Atherosclerotic heart disease of native coronary artery without angina pectoris: Secondary | ICD-10-CM | POA: Diagnosis not present

## 2019-02-17 DIAGNOSIS — I739 Peripheral vascular disease, unspecified: Secondary | ICD-10-CM | POA: Diagnosis not present

## 2019-02-17 DIAGNOSIS — I208 Other forms of angina pectoris: Secondary | ICD-10-CM | POA: Diagnosis not present

## 2019-02-18 DIAGNOSIS — J438 Other emphysema: Secondary | ICD-10-CM | POA: Diagnosis not present

## 2019-02-18 DIAGNOSIS — J449 Chronic obstructive pulmonary disease, unspecified: Secondary | ICD-10-CM | POA: Diagnosis not present

## 2019-02-18 DIAGNOSIS — J209 Acute bronchitis, unspecified: Secondary | ICD-10-CM | POA: Diagnosis not present

## 2019-02-18 DIAGNOSIS — R062 Wheezing: Secondary | ICD-10-CM | POA: Diagnosis not present

## 2019-02-23 ENCOUNTER — Other Ambulatory Visit: Payer: Self-pay | Admitting: Osteopathic Medicine

## 2019-02-23 DIAGNOSIS — G8929 Other chronic pain: Secondary | ICD-10-CM | POA: Diagnosis not present

## 2019-02-23 DIAGNOSIS — M25511 Pain in right shoulder: Secondary | ICD-10-CM | POA: Diagnosis not present

## 2019-03-10 DIAGNOSIS — Z79899 Other long term (current) drug therapy: Secondary | ICD-10-CM | POA: Diagnosis not present

## 2019-03-10 DIAGNOSIS — Z79891 Long term (current) use of opiate analgesic: Secondary | ICD-10-CM | POA: Diagnosis not present

## 2019-03-10 DIAGNOSIS — M19019 Primary osteoarthritis, unspecified shoulder: Secondary | ICD-10-CM | POA: Diagnosis not present

## 2019-03-10 DIAGNOSIS — M1612 Unilateral primary osteoarthritis, left hip: Secondary | ICD-10-CM | POA: Diagnosis not present

## 2019-03-10 DIAGNOSIS — G894 Chronic pain syndrome: Secondary | ICD-10-CM | POA: Diagnosis not present

## 2019-03-10 DIAGNOSIS — M5136 Other intervertebral disc degeneration, lumbar region: Secondary | ICD-10-CM | POA: Diagnosis not present

## 2019-03-11 DIAGNOSIS — R9389 Abnormal findings on diagnostic imaging of other specified body structures: Secondary | ICD-10-CM | POA: Diagnosis not present

## 2019-03-11 DIAGNOSIS — R918 Other nonspecific abnormal finding of lung field: Secondary | ICD-10-CM | POA: Diagnosis not present

## 2019-03-12 ENCOUNTER — Other Ambulatory Visit: Payer: Self-pay | Admitting: Osteopathic Medicine

## 2019-03-20 DIAGNOSIS — J209 Acute bronchitis, unspecified: Secondary | ICD-10-CM | POA: Diagnosis not present

## 2019-03-20 DIAGNOSIS — J438 Other emphysema: Secondary | ICD-10-CM | POA: Diagnosis not present

## 2019-03-20 DIAGNOSIS — J449 Chronic obstructive pulmonary disease, unspecified: Secondary | ICD-10-CM | POA: Diagnosis not present

## 2019-03-20 DIAGNOSIS — R062 Wheezing: Secondary | ICD-10-CM | POA: Diagnosis not present

## 2019-03-29 ENCOUNTER — Other Ambulatory Visit: Payer: Self-pay | Admitting: Osteopathic Medicine

## 2019-03-29 DIAGNOSIS — J302 Other seasonal allergic rhinitis: Secondary | ICD-10-CM

## 2019-03-29 NOTE — Telephone Encounter (Signed)
Please review for refill- patient at [facility] 

## 2019-03-31 DIAGNOSIS — M25511 Pain in right shoulder: Secondary | ICD-10-CM | POA: Diagnosis not present

## 2019-04-07 DIAGNOSIS — G894 Chronic pain syndrome: Secondary | ICD-10-CM | POA: Diagnosis not present

## 2019-04-07 DIAGNOSIS — M1612 Unilateral primary osteoarthritis, left hip: Secondary | ICD-10-CM | POA: Diagnosis not present

## 2019-04-07 DIAGNOSIS — M5136 Other intervertebral disc degeneration, lumbar region: Secondary | ICD-10-CM | POA: Diagnosis not present

## 2019-04-07 DIAGNOSIS — M19019 Primary osteoarthritis, unspecified shoulder: Secondary | ICD-10-CM | POA: Diagnosis not present

## 2019-04-14 DIAGNOSIS — F5101 Primary insomnia: Secondary | ICD-10-CM | POA: Diagnosis not present

## 2019-04-14 DIAGNOSIS — F0281 Dementia in other diseases classified elsewhere with behavioral disturbance: Secondary | ICD-10-CM | POA: Diagnosis not present

## 2019-04-14 DIAGNOSIS — R69 Illness, unspecified: Secondary | ICD-10-CM | POA: Diagnosis not present

## 2019-04-14 DIAGNOSIS — F411 Generalized anxiety disorder: Secondary | ICD-10-CM | POA: Diagnosis not present

## 2019-04-20 DIAGNOSIS — J438 Other emphysema: Secondary | ICD-10-CM | POA: Diagnosis not present

## 2019-04-20 DIAGNOSIS — R062 Wheezing: Secondary | ICD-10-CM | POA: Diagnosis not present

## 2019-04-20 DIAGNOSIS — J209 Acute bronchitis, unspecified: Secondary | ICD-10-CM | POA: Diagnosis not present

## 2019-04-20 DIAGNOSIS — J449 Chronic obstructive pulmonary disease, unspecified: Secondary | ICD-10-CM | POA: Diagnosis not present

## 2019-04-28 DIAGNOSIS — F411 Generalized anxiety disorder: Secondary | ICD-10-CM | POA: Diagnosis not present

## 2019-04-28 DIAGNOSIS — F5101 Primary insomnia: Secondary | ICD-10-CM | POA: Diagnosis not present

## 2019-04-28 DIAGNOSIS — F0281 Dementia in other diseases classified elsewhere with behavioral disturbance: Secondary | ICD-10-CM | POA: Diagnosis not present

## 2019-04-28 DIAGNOSIS — R69 Illness, unspecified: Secondary | ICD-10-CM | POA: Diagnosis not present

## 2019-04-29 DIAGNOSIS — J9611 Chronic respiratory failure with hypoxia: Secondary | ICD-10-CM | POA: Diagnosis not present

## 2019-04-29 DIAGNOSIS — E785 Hyperlipidemia, unspecified: Secondary | ICD-10-CM | POA: Diagnosis not present

## 2019-04-29 DIAGNOSIS — R69 Illness, unspecified: Secondary | ICD-10-CM | POA: Diagnosis not present

## 2019-04-29 DIAGNOSIS — E1159 Type 2 diabetes mellitus with other circulatory complications: Secondary | ICD-10-CM | POA: Diagnosis not present

## 2019-04-29 DIAGNOSIS — E1151 Type 2 diabetes mellitus with diabetic peripheral angiopathy without gangrene: Secondary | ICD-10-CM | POA: Diagnosis not present

## 2019-04-29 DIAGNOSIS — J439 Emphysema, unspecified: Secondary | ICD-10-CM | POA: Diagnosis not present

## 2019-04-29 DIAGNOSIS — I25119 Atherosclerotic heart disease of native coronary artery with unspecified angina pectoris: Secondary | ICD-10-CM | POA: Diagnosis not present

## 2019-04-29 DIAGNOSIS — Z008 Encounter for other general examination: Secondary | ICD-10-CM | POA: Diagnosis not present

## 2019-04-29 DIAGNOSIS — E46 Unspecified protein-calorie malnutrition: Secondary | ICD-10-CM | POA: Diagnosis not present

## 2019-04-29 DIAGNOSIS — E1142 Type 2 diabetes mellitus with diabetic polyneuropathy: Secondary | ICD-10-CM | POA: Diagnosis not present

## 2019-05-05 DIAGNOSIS — G894 Chronic pain syndrome: Secondary | ICD-10-CM | POA: Diagnosis not present

## 2019-05-05 DIAGNOSIS — M5136 Other intervertebral disc degeneration, lumbar region: Secondary | ICD-10-CM | POA: Diagnosis not present

## 2019-05-05 DIAGNOSIS — Z79891 Long term (current) use of opiate analgesic: Secondary | ICD-10-CM | POA: Diagnosis not present

## 2019-05-05 DIAGNOSIS — M47816 Spondylosis without myelopathy or radiculopathy, lumbar region: Secondary | ICD-10-CM | POA: Diagnosis not present

## 2019-05-05 DIAGNOSIS — M19019 Primary osteoarthritis, unspecified shoulder: Secondary | ICD-10-CM | POA: Diagnosis not present

## 2019-05-05 DIAGNOSIS — Z79899 Other long term (current) drug therapy: Secondary | ICD-10-CM | POA: Diagnosis not present

## 2019-05-11 DIAGNOSIS — M19019 Primary osteoarthritis, unspecified shoulder: Secondary | ICD-10-CM | POA: Diagnosis not present

## 2019-05-18 ENCOUNTER — Other Ambulatory Visit: Payer: Self-pay | Admitting: Osteopathic Medicine

## 2019-05-21 DIAGNOSIS — R062 Wheezing: Secondary | ICD-10-CM | POA: Diagnosis not present

## 2019-05-21 DIAGNOSIS — J209 Acute bronchitis, unspecified: Secondary | ICD-10-CM | POA: Diagnosis not present

## 2019-05-21 DIAGNOSIS — J449 Chronic obstructive pulmonary disease, unspecified: Secondary | ICD-10-CM | POA: Diagnosis not present

## 2019-05-21 DIAGNOSIS — J438 Other emphysema: Secondary | ICD-10-CM | POA: Diagnosis not present

## 2019-05-24 DIAGNOSIS — F411 Generalized anxiety disorder: Secondary | ICD-10-CM | POA: Diagnosis not present

## 2019-05-24 DIAGNOSIS — F0281 Dementia in other diseases classified elsewhere with behavioral disturbance: Secondary | ICD-10-CM | POA: Diagnosis not present

## 2019-05-24 DIAGNOSIS — R69 Illness, unspecified: Secondary | ICD-10-CM | POA: Diagnosis not present

## 2019-05-24 DIAGNOSIS — F5101 Primary insomnia: Secondary | ICD-10-CM | POA: Diagnosis not present

## 2019-05-31 DIAGNOSIS — R109 Unspecified abdominal pain: Secondary | ICD-10-CM | POA: Diagnosis not present

## 2019-05-31 DIAGNOSIS — E1122 Type 2 diabetes mellitus with diabetic chronic kidney disease: Secondary | ICD-10-CM | POA: Diagnosis not present

## 2019-05-31 DIAGNOSIS — J159 Unspecified bacterial pneumonia: Secondary | ICD-10-CM | POA: Diagnosis not present

## 2019-05-31 DIAGNOSIS — D649 Anemia, unspecified: Secondary | ICD-10-CM | POA: Diagnosis not present

## 2019-05-31 DIAGNOSIS — N179 Acute kidney failure, unspecified: Secondary | ICD-10-CM | POA: Diagnosis not present

## 2019-05-31 DIAGNOSIS — R5381 Other malaise: Secondary | ICD-10-CM | POA: Diagnosis not present

## 2019-05-31 DIAGNOSIS — R634 Abnormal weight loss: Secondary | ICD-10-CM | POA: Diagnosis not present

## 2019-05-31 DIAGNOSIS — N1832 Chronic kidney disease, stage 3b: Secondary | ICD-10-CM | POA: Diagnosis not present

## 2019-05-31 DIAGNOSIS — R69 Illness, unspecified: Secondary | ICD-10-CM | POA: Diagnosis not present

## 2019-05-31 DIAGNOSIS — K449 Diaphragmatic hernia without obstruction or gangrene: Secondary | ICD-10-CM | POA: Diagnosis not present

## 2019-05-31 DIAGNOSIS — J189 Pneumonia, unspecified organism: Secondary | ICD-10-CM | POA: Diagnosis not present

## 2019-05-31 DIAGNOSIS — J440 Chronic obstructive pulmonary disease with acute lower respiratory infection: Secondary | ICD-10-CM | POA: Diagnosis not present

## 2019-05-31 DIAGNOSIS — G8929 Other chronic pain: Secondary | ICD-10-CM | POA: Diagnosis not present

## 2019-05-31 DIAGNOSIS — R531 Weakness: Secondary | ICD-10-CM | POA: Diagnosis not present

## 2019-05-31 DIAGNOSIS — I129 Hypertensive chronic kidney disease with stage 1 through stage 4 chronic kidney disease, or unspecified chronic kidney disease: Secondary | ICD-10-CM | POA: Diagnosis not present

## 2019-05-31 DIAGNOSIS — K5900 Constipation, unspecified: Secondary | ICD-10-CM | POA: Diagnosis not present

## 2019-05-31 DIAGNOSIS — I517 Cardiomegaly: Secondary | ICD-10-CM | POA: Diagnosis not present

## 2019-05-31 DIAGNOSIS — I959 Hypotension, unspecified: Secondary | ICD-10-CM | POA: Diagnosis not present

## 2019-05-31 DIAGNOSIS — I10 Essential (primary) hypertension: Secondary | ICD-10-CM | POA: Diagnosis not present

## 2019-05-31 DIAGNOSIS — R112 Nausea with vomiting, unspecified: Secondary | ICD-10-CM | POA: Diagnosis not present

## 2019-05-31 DIAGNOSIS — N189 Chronic kidney disease, unspecified: Secondary | ICD-10-CM | POA: Diagnosis not present

## 2019-05-31 DIAGNOSIS — Z20822 Contact with and (suspected) exposure to covid-19: Secondary | ICD-10-CM | POA: Diagnosis not present

## 2019-05-31 DIAGNOSIS — R0902 Hypoxemia: Secondary | ICD-10-CM | POA: Diagnosis not present

## 2019-05-31 DIAGNOSIS — E860 Dehydration: Secondary | ICD-10-CM | POA: Diagnosis not present

## 2019-05-31 DIAGNOSIS — R001 Bradycardia, unspecified: Secondary | ICD-10-CM | POA: Diagnosis not present

## 2019-05-31 DIAGNOSIS — J9610 Chronic respiratory failure, unspecified whether with hypoxia or hypercapnia: Secondary | ICD-10-CM | POA: Diagnosis not present

## 2019-05-31 DIAGNOSIS — N281 Cyst of kidney, acquired: Secondary | ICD-10-CM | POA: Diagnosis not present

## 2019-05-31 DIAGNOSIS — E872 Acidosis: Secondary | ICD-10-CM | POA: Diagnosis not present

## 2019-06-12 MED ORDER — BENZONATATE 100 MG PO CAPS
200.00 | ORAL_CAPSULE | ORAL | Status: DC
Start: ? — End: 2019-06-12

## 2019-06-12 MED ORDER — GENERIC EXTERNAL MEDICATION
Status: DC
Start: ? — End: 2019-06-12

## 2019-06-12 MED ORDER — MIRTAZAPINE 15 MG PO TABS
7.50 | ORAL_TABLET | ORAL | Status: DC
Start: 2019-06-11 — End: 2019-06-12

## 2019-06-12 MED ORDER — BISACODYL 10 MG RE SUPP
10.00 | RECTAL | Status: DC
Start: 2019-06-12 — End: 2019-06-12

## 2019-06-12 MED ORDER — PANTOPRAZOLE SODIUM 20 MG PO TBEC
20.00 | DELAYED_RELEASE_TABLET | ORAL | Status: DC
Start: 2019-06-12 — End: 2019-06-12

## 2019-06-12 MED ORDER — GENERIC EXTERNAL MEDICATION
90.00 | Status: DC
Start: 2019-06-12 — End: 2019-06-12

## 2019-06-12 MED ORDER — ZOLPIDEM TARTRATE 5 MG PO TABS
10.00 | ORAL_TABLET | ORAL | Status: DC
Start: ? — End: 2019-06-12

## 2019-06-12 MED ORDER — IPRATROPIUM-ALBUTEROL 0.5-2.5 (3) MG/3ML IN SOLN
3.00 | RESPIRATORY_TRACT | Status: DC
Start: ? — End: 2019-06-12

## 2019-06-12 MED ORDER — NICOTINE 14 MG/24HR TD PT24
1.00 | MEDICATED_PATCH | TRANSDERMAL | Status: DC
Start: 2019-06-12 — End: 2019-06-12

## 2019-06-12 MED ORDER — HYDROCODONE-ACETAMINOPHEN 5-325 MG PO TABS
1.00 | ORAL_TABLET | ORAL | Status: DC
Start: ? — End: 2019-06-12

## 2019-06-12 MED ORDER — LAMOTRIGINE 100 MG PO TABS
100.00 | ORAL_TABLET | ORAL | Status: DC
Start: 2019-06-11 — End: 2019-06-12

## 2019-06-12 MED ORDER — POLYETHYLENE GLYCOL 3350 17 GM/SCOOP PO POWD
17.00 | ORAL | Status: DC
Start: 2019-06-11 — End: 2019-06-12

## 2019-06-12 MED ORDER — HYDROCODONE-ACETAMINOPHEN 10-325 MG PO TABS
1.00 | ORAL_TABLET | ORAL | Status: DC
Start: ? — End: 2019-06-12

## 2019-06-12 MED ORDER — SODIUM CHLORIDE 0.9 % IV SOLN
10.00 | INTRAVENOUS | Status: DC
Start: ? — End: 2019-06-12

## 2019-06-12 MED ORDER — FLUTICASONE PROPIONATE 50 MCG/ACT NA SUSP
1.00 | NASAL | Status: DC
Start: 2019-06-12 — End: 2019-06-12

## 2019-06-12 MED ORDER — LAMOTRIGINE 100 MG PO TABS
50.00 | ORAL_TABLET | ORAL | Status: DC
Start: 2019-06-12 — End: 2019-06-12

## 2019-06-12 MED ORDER — GUAIFENESIN 400 MG PO TABS
400.00 | ORAL_TABLET | ORAL | Status: DC
Start: ? — End: 2019-06-12

## 2019-06-12 MED ORDER — DONEPEZIL HCL 10 MG PO TABS
10.00 | ORAL_TABLET | ORAL | Status: DC
Start: 2019-06-11 — End: 2019-06-12

## 2019-06-12 MED ORDER — NEPHRO-VITE 0.8 MG PO TABS
1.00 | ORAL_TABLET | ORAL | Status: DC
Start: 2019-06-12 — End: 2019-06-12

## 2019-06-12 MED ORDER — SENNOSIDES 8.6 MG PO TABS
2.00 | ORAL_TABLET | ORAL | Status: DC
Start: 2019-06-11 — End: 2019-06-12

## 2019-06-12 MED ORDER — LIDOCAINE-MENTHOL 3.6-1.25 % EX PTCH
1.00 | MEDICATED_PATCH | CUTANEOUS | Status: DC
Start: 2019-06-11 — End: 2019-06-12

## 2019-06-12 MED ORDER — PRAVASTATIN SODIUM 40 MG PO TABS
40.00 | ORAL_TABLET | ORAL | Status: DC
Start: 2019-06-12 — End: 2019-06-12

## 2019-06-12 MED ORDER — LORATADINE 10 MG PO TABS
10.00 | ORAL_TABLET | ORAL | Status: DC
Start: 2019-06-12 — End: 2019-06-12

## 2019-06-12 MED ORDER — GENERIC EXTERNAL MEDICATION
150.00 | Status: DC
Start: 2019-06-12 — End: 2019-06-12

## 2019-06-15 DIAGNOSIS — M5136 Other intervertebral disc degeneration, lumbar region: Secondary | ICD-10-CM | POA: Diagnosis not present

## 2019-06-15 DIAGNOSIS — M47816 Spondylosis without myelopathy or radiculopathy, lumbar region: Secondary | ICD-10-CM | POA: Diagnosis not present

## 2019-06-15 DIAGNOSIS — G894 Chronic pain syndrome: Secondary | ICD-10-CM | POA: Diagnosis not present

## 2019-06-15 DIAGNOSIS — M19019 Primary osteoarthritis, unspecified shoulder: Secondary | ICD-10-CM | POA: Diagnosis not present

## 2019-06-16 DIAGNOSIS — J440 Chronic obstructive pulmonary disease with acute lower respiratory infection: Secondary | ICD-10-CM | POA: Diagnosis not present

## 2019-06-16 DIAGNOSIS — K449 Diaphragmatic hernia without obstruction or gangrene: Secondary | ICD-10-CM | POA: Diagnosis not present

## 2019-06-16 DIAGNOSIS — E1122 Type 2 diabetes mellitus with diabetic chronic kidney disease: Secondary | ICD-10-CM | POA: Diagnosis not present

## 2019-06-16 DIAGNOSIS — K5900 Constipation, unspecified: Secondary | ICD-10-CM | POA: Diagnosis not present

## 2019-06-16 DIAGNOSIS — N1832 Chronic kidney disease, stage 3b: Secondary | ICD-10-CM | POA: Diagnosis not present

## 2019-06-16 DIAGNOSIS — D631 Anemia in chronic kidney disease: Secondary | ICD-10-CM | POA: Diagnosis not present

## 2019-06-16 DIAGNOSIS — J189 Pneumonia, unspecified organism: Secondary | ICD-10-CM | POA: Diagnosis not present

## 2019-06-16 DIAGNOSIS — I2510 Atherosclerotic heart disease of native coronary artery without angina pectoris: Secondary | ICD-10-CM | POA: Diagnosis not present

## 2019-06-16 DIAGNOSIS — J9610 Chronic respiratory failure, unspecified whether with hypoxia or hypercapnia: Secondary | ICD-10-CM | POA: Diagnosis not present

## 2019-06-16 DIAGNOSIS — I129 Hypertensive chronic kidney disease with stage 1 through stage 4 chronic kidney disease, or unspecified chronic kidney disease: Secondary | ICD-10-CM | POA: Diagnosis not present

## 2019-06-18 DIAGNOSIS — J209 Acute bronchitis, unspecified: Secondary | ICD-10-CM | POA: Diagnosis not present

## 2019-06-18 DIAGNOSIS — J438 Other emphysema: Secondary | ICD-10-CM | POA: Diagnosis not present

## 2019-06-18 DIAGNOSIS — R062 Wheezing: Secondary | ICD-10-CM | POA: Diagnosis not present

## 2019-06-18 DIAGNOSIS — J449 Chronic obstructive pulmonary disease, unspecified: Secondary | ICD-10-CM | POA: Diagnosis not present

## 2019-06-19 DIAGNOSIS — D631 Anemia in chronic kidney disease: Secondary | ICD-10-CM | POA: Diagnosis not present

## 2019-06-19 DIAGNOSIS — N1832 Chronic kidney disease, stage 3b: Secondary | ICD-10-CM | POA: Diagnosis not present

## 2019-06-19 DIAGNOSIS — J440 Chronic obstructive pulmonary disease with acute lower respiratory infection: Secondary | ICD-10-CM | POA: Diagnosis not present

## 2019-06-19 DIAGNOSIS — I2510 Atherosclerotic heart disease of native coronary artery without angina pectoris: Secondary | ICD-10-CM | POA: Diagnosis not present

## 2019-06-19 DIAGNOSIS — K449 Diaphragmatic hernia without obstruction or gangrene: Secondary | ICD-10-CM | POA: Diagnosis not present

## 2019-06-19 DIAGNOSIS — J189 Pneumonia, unspecified organism: Secondary | ICD-10-CM | POA: Diagnosis not present

## 2019-06-19 DIAGNOSIS — E1122 Type 2 diabetes mellitus with diabetic chronic kidney disease: Secondary | ICD-10-CM | POA: Diagnosis not present

## 2019-06-19 DIAGNOSIS — J9610 Chronic respiratory failure, unspecified whether with hypoxia or hypercapnia: Secondary | ICD-10-CM | POA: Diagnosis not present

## 2019-06-19 DIAGNOSIS — I129 Hypertensive chronic kidney disease with stage 1 through stage 4 chronic kidney disease, or unspecified chronic kidney disease: Secondary | ICD-10-CM | POA: Diagnosis not present

## 2019-06-19 DIAGNOSIS — K5900 Constipation, unspecified: Secondary | ICD-10-CM | POA: Diagnosis not present

## 2019-06-29 DIAGNOSIS — K449 Diaphragmatic hernia without obstruction or gangrene: Secondary | ICD-10-CM | POA: Diagnosis not present

## 2019-06-29 DIAGNOSIS — N1832 Chronic kidney disease, stage 3b: Secondary | ICD-10-CM | POA: Diagnosis not present

## 2019-06-29 DIAGNOSIS — K5900 Constipation, unspecified: Secondary | ICD-10-CM | POA: Diagnosis not present

## 2019-06-29 DIAGNOSIS — J9610 Chronic respiratory failure, unspecified whether with hypoxia or hypercapnia: Secondary | ICD-10-CM | POA: Diagnosis not present

## 2019-06-29 DIAGNOSIS — J189 Pneumonia, unspecified organism: Secondary | ICD-10-CM | POA: Diagnosis not present

## 2019-06-29 DIAGNOSIS — D631 Anemia in chronic kidney disease: Secondary | ICD-10-CM | POA: Diagnosis not present

## 2019-06-29 DIAGNOSIS — I129 Hypertensive chronic kidney disease with stage 1 through stage 4 chronic kidney disease, or unspecified chronic kidney disease: Secondary | ICD-10-CM | POA: Diagnosis not present

## 2019-06-29 DIAGNOSIS — E1122 Type 2 diabetes mellitus with diabetic chronic kidney disease: Secondary | ICD-10-CM | POA: Diagnosis not present

## 2019-06-29 DIAGNOSIS — I2510 Atherosclerotic heart disease of native coronary artery without angina pectoris: Secondary | ICD-10-CM | POA: Diagnosis not present

## 2019-06-29 DIAGNOSIS — J440 Chronic obstructive pulmonary disease with acute lower respiratory infection: Secondary | ICD-10-CM | POA: Diagnosis not present

## 2019-07-01 DIAGNOSIS — I2510 Atherosclerotic heart disease of native coronary artery without angina pectoris: Secondary | ICD-10-CM | POA: Diagnosis not present

## 2019-07-01 DIAGNOSIS — N1832 Chronic kidney disease, stage 3b: Secondary | ICD-10-CM | POA: Diagnosis not present

## 2019-07-01 DIAGNOSIS — K5900 Constipation, unspecified: Secondary | ICD-10-CM | POA: Diagnosis not present

## 2019-07-01 DIAGNOSIS — E1122 Type 2 diabetes mellitus with diabetic chronic kidney disease: Secondary | ICD-10-CM | POA: Diagnosis not present

## 2019-07-01 DIAGNOSIS — D631 Anemia in chronic kidney disease: Secondary | ICD-10-CM | POA: Diagnosis not present

## 2019-07-01 DIAGNOSIS — K449 Diaphragmatic hernia without obstruction or gangrene: Secondary | ICD-10-CM | POA: Diagnosis not present

## 2019-07-01 DIAGNOSIS — J9610 Chronic respiratory failure, unspecified whether with hypoxia or hypercapnia: Secondary | ICD-10-CM | POA: Diagnosis not present

## 2019-07-01 DIAGNOSIS — J440 Chronic obstructive pulmonary disease with acute lower respiratory infection: Secondary | ICD-10-CM | POA: Diagnosis not present

## 2019-07-01 DIAGNOSIS — J189 Pneumonia, unspecified organism: Secondary | ICD-10-CM | POA: Diagnosis not present

## 2019-07-01 DIAGNOSIS — I129 Hypertensive chronic kidney disease with stage 1 through stage 4 chronic kidney disease, or unspecified chronic kidney disease: Secondary | ICD-10-CM | POA: Diagnosis not present

## 2019-07-06 DIAGNOSIS — K5900 Constipation, unspecified: Secondary | ICD-10-CM | POA: Diagnosis not present

## 2019-07-06 DIAGNOSIS — D631 Anemia in chronic kidney disease: Secondary | ICD-10-CM | POA: Diagnosis not present

## 2019-07-06 DIAGNOSIS — J9610 Chronic respiratory failure, unspecified whether with hypoxia or hypercapnia: Secondary | ICD-10-CM | POA: Diagnosis not present

## 2019-07-06 DIAGNOSIS — N1832 Chronic kidney disease, stage 3b: Secondary | ICD-10-CM | POA: Diagnosis not present

## 2019-07-06 DIAGNOSIS — K449 Diaphragmatic hernia without obstruction or gangrene: Secondary | ICD-10-CM | POA: Diagnosis not present

## 2019-07-06 DIAGNOSIS — E1122 Type 2 diabetes mellitus with diabetic chronic kidney disease: Secondary | ICD-10-CM | POA: Diagnosis not present

## 2019-07-06 DIAGNOSIS — J189 Pneumonia, unspecified organism: Secondary | ICD-10-CM | POA: Diagnosis not present

## 2019-07-06 DIAGNOSIS — I129 Hypertensive chronic kidney disease with stage 1 through stage 4 chronic kidney disease, or unspecified chronic kidney disease: Secondary | ICD-10-CM | POA: Diagnosis not present

## 2019-07-06 DIAGNOSIS — J440 Chronic obstructive pulmonary disease with acute lower respiratory infection: Secondary | ICD-10-CM | POA: Diagnosis not present

## 2019-07-06 DIAGNOSIS — I2510 Atherosclerotic heart disease of native coronary artery without angina pectoris: Secondary | ICD-10-CM | POA: Diagnosis not present

## 2019-07-13 DIAGNOSIS — M19019 Primary osteoarthritis, unspecified shoulder: Secondary | ICD-10-CM | POA: Diagnosis not present

## 2019-07-13 DIAGNOSIS — M5136 Other intervertebral disc degeneration, lumbar region: Secondary | ICD-10-CM | POA: Diagnosis not present

## 2019-07-13 DIAGNOSIS — G894 Chronic pain syndrome: Secondary | ICD-10-CM | POA: Diagnosis not present

## 2019-07-13 DIAGNOSIS — M1612 Unilateral primary osteoarthritis, left hip: Secondary | ICD-10-CM | POA: Diagnosis not present

## 2019-07-13 DIAGNOSIS — Z79899 Other long term (current) drug therapy: Secondary | ICD-10-CM | POA: Diagnosis not present

## 2019-07-13 DIAGNOSIS — Z79891 Long term (current) use of opiate analgesic: Secondary | ICD-10-CM | POA: Diagnosis not present

## 2019-07-14 ENCOUNTER — Telehealth: Payer: Self-pay

## 2019-07-14 NOTE — Telephone Encounter (Signed)
Pt's wife called stating she will need provider to complete a FL2 form for pt. She mentioned that the form will be for special assistance. She did not indicate time/date when she will need the form to be completed. Pls advise, thanks.

## 2019-07-15 NOTE — Telephone Encounter (Signed)
Attempted to contact pt's wife regarding request for Fl2 form. Phone keeps ringing then call ends abruptly. No options for vm.

## 2019-07-15 NOTE — Telephone Encounter (Signed)
FL to can either be from nursing home or for long-term home care, this is probably something that is going to require a detailed discussion so that I can fill it out.  Can se see if they are able to schedule them for a virtual/phone visit so that I can make sure everything is taken care of appropriately?  Prior to visit, it will help me to know the name of the facility/company that this will be going to and the reason that it is needed

## 2019-07-19 DIAGNOSIS — J449 Chronic obstructive pulmonary disease, unspecified: Secondary | ICD-10-CM | POA: Diagnosis not present

## 2019-07-19 DIAGNOSIS — J438 Other emphysema: Secondary | ICD-10-CM | POA: Diagnosis not present

## 2019-07-19 DIAGNOSIS — R062 Wheezing: Secondary | ICD-10-CM | POA: Diagnosis not present

## 2019-07-19 DIAGNOSIS — J209 Acute bronchitis, unspecified: Secondary | ICD-10-CM | POA: Diagnosis not present

## 2019-07-25 ENCOUNTER — Other Ambulatory Visit: Payer: Self-pay | Admitting: Osteopathic Medicine

## 2019-07-25 DIAGNOSIS — J302 Other seasonal allergic rhinitis: Secondary | ICD-10-CM

## 2019-08-09 ENCOUNTER — Telehealth: Payer: Self-pay

## 2019-08-09 NOTE — Telephone Encounter (Signed)
Received call from [facility] with [facility] and she wanted to let provider know they are discharging patient without a visit cause he is refusing to take their phone calls and when they do get to speak with him, he refuses visits.   FYI to PCP no action needed.

## 2019-08-10 ENCOUNTER — Other Ambulatory Visit: Payer: Self-pay | Admitting: Osteopathic Medicine

## 2019-08-10 DIAGNOSIS — M19019 Primary osteoarthritis, unspecified shoulder: Secondary | ICD-10-CM | POA: Diagnosis not present

## 2019-08-10 DIAGNOSIS — J302 Other seasonal allergic rhinitis: Secondary | ICD-10-CM

## 2019-08-11 NOTE — Telephone Encounter (Signed)
Advised family to schedule an appointment.

## 2019-08-18 DIAGNOSIS — J209 Acute bronchitis, unspecified: Secondary | ICD-10-CM | POA: Diagnosis not present

## 2019-08-18 DIAGNOSIS — R062 Wheezing: Secondary | ICD-10-CM | POA: Diagnosis not present

## 2019-08-18 DIAGNOSIS — J438 Other emphysema: Secondary | ICD-10-CM | POA: Diagnosis not present

## 2019-08-18 DIAGNOSIS — F411 Generalized anxiety disorder: Secondary | ICD-10-CM | POA: Diagnosis not present

## 2019-08-18 DIAGNOSIS — F5101 Primary insomnia: Secondary | ICD-10-CM | POA: Diagnosis not present

## 2019-08-18 DIAGNOSIS — F0281 Dementia in other diseases classified elsewhere with behavioral disturbance: Secondary | ICD-10-CM | POA: Diagnosis not present

## 2019-08-18 DIAGNOSIS — J449 Chronic obstructive pulmonary disease, unspecified: Secondary | ICD-10-CM | POA: Diagnosis not present

## 2019-08-18 DIAGNOSIS — R69 Illness, unspecified: Secondary | ICD-10-CM | POA: Diagnosis not present

## 2019-08-25 ENCOUNTER — Telehealth: Payer: Self-pay | Admitting: Neurology

## 2019-08-25 NOTE — Telephone Encounter (Signed)
Patient's wife called and requested that we send an updated note and medication list to patient's psychiatrist Sharion Dove at fax number [phone]. She can be reached at [phone].

## 2019-08-25 NOTE — Telephone Encounter (Signed)
Last note and med list sent as requested.

## 2019-08-30 NOTE — Telephone Encounter (Signed)
[name] left a message and states she needed a call back. I called back multiple times. The phone would ring and then hang up.

## 2019-09-07 DIAGNOSIS — M47816 Spondylosis without myelopathy or radiculopathy, lumbar region: Secondary | ICD-10-CM | POA: Diagnosis not present

## 2019-09-07 DIAGNOSIS — G894 Chronic pain syndrome: Secondary | ICD-10-CM | POA: Diagnosis not present

## 2019-09-07 DIAGNOSIS — M5136 Other intervertebral disc degeneration, lumbar region: Secondary | ICD-10-CM | POA: Diagnosis not present

## 2019-09-07 DIAGNOSIS — M19019 Primary osteoarthritis, unspecified shoulder: Secondary | ICD-10-CM | POA: Diagnosis not present

## 2019-09-10 DIAGNOSIS — I517 Cardiomegaly: Secondary | ICD-10-CM | POA: Diagnosis not present

## 2019-09-10 DIAGNOSIS — I4891 Unspecified atrial fibrillation: Secondary | ICD-10-CM | POA: Diagnosis not present

## 2019-09-10 DIAGNOSIS — A419 Sepsis, unspecified organism: Secondary | ICD-10-CM | POA: Diagnosis not present

## 2019-09-10 DIAGNOSIS — E1122 Type 2 diabetes mellitus with diabetic chronic kidney disease: Secondary | ICD-10-CM | POA: Diagnosis not present

## 2019-09-10 DIAGNOSIS — E875 Hyperkalemia: Secondary | ICD-10-CM | POA: Diagnosis not present

## 2019-09-10 DIAGNOSIS — D649 Anemia, unspecified: Secondary | ICD-10-CM | POA: Diagnosis not present

## 2019-09-10 DIAGNOSIS — R069 Unspecified abnormalities of breathing: Secondary | ICD-10-CM | POA: Diagnosis not present

## 2019-09-10 DIAGNOSIS — Z20822 Contact with and (suspected) exposure to covid-19: Secondary | ICD-10-CM | POA: Diagnosis not present

## 2019-09-10 DIAGNOSIS — N184 Chronic kidney disease, stage 4 (severe): Secondary | ICD-10-CM | POA: Diagnosis not present

## 2019-09-10 DIAGNOSIS — D631 Anemia in chronic kidney disease: Secondary | ICD-10-CM | POA: Diagnosis not present

## 2019-09-10 DIAGNOSIS — R69 Illness, unspecified: Secondary | ICD-10-CM | POA: Diagnosis not present

## 2019-09-10 DIAGNOSIS — R0602 Shortness of breath: Secondary | ICD-10-CM | POA: Diagnosis not present

## 2019-09-10 DIAGNOSIS — R918 Other nonspecific abnormal finding of lung field: Secondary | ICD-10-CM | POA: Diagnosis not present

## 2019-09-10 DIAGNOSIS — R0902 Hypoxemia: Secondary | ICD-10-CM | POA: Diagnosis not present

## 2019-09-10 DIAGNOSIS — R6521 Severe sepsis with septic shock: Secondary | ICD-10-CM | POA: Diagnosis not present

## 2019-09-10 DIAGNOSIS — N179 Acute kidney failure, unspecified: Secondary | ICD-10-CM | POA: Diagnosis not present

## 2019-09-10 DIAGNOSIS — J189 Pneumonia, unspecified organism: Secondary | ICD-10-CM | POA: Diagnosis not present

## 2019-09-10 DIAGNOSIS — D509 Iron deficiency anemia, unspecified: Secondary | ICD-10-CM | POA: Diagnosis not present

## 2019-09-10 DIAGNOSIS — I129 Hypertensive chronic kidney disease with stage 1 through stage 4 chronic kidney disease, or unspecified chronic kidney disease: Secondary | ICD-10-CM | POA: Diagnosis not present

## 2019-09-10 DIAGNOSIS — E872 Acidosis: Secondary | ICD-10-CM | POA: Diagnosis not present

## 2019-09-10 DIAGNOSIS — J449 Chronic obstructive pulmonary disease, unspecified: Secondary | ICD-10-CM | POA: Diagnosis not present

## 2019-09-10 DIAGNOSIS — N189 Chronic kidney disease, unspecified: Secondary | ICD-10-CM | POA: Diagnosis not present

## 2019-09-10 DIAGNOSIS — J984 Other disorders of lung: Secondary | ICD-10-CM | POA: Diagnosis not present

## 2019-09-10 DIAGNOSIS — I959 Hypotension, unspecified: Secondary | ICD-10-CM | POA: Diagnosis not present

## 2019-09-11 DIAGNOSIS — E872 Acidosis: Secondary | ICD-10-CM | POA: Diagnosis not present

## 2019-09-11 DIAGNOSIS — K449 Diaphragmatic hernia without obstruction or gangrene: Secondary | ICD-10-CM | POA: Diagnosis not present

## 2019-09-11 DIAGNOSIS — N281 Cyst of kidney, acquired: Secondary | ICD-10-CM | POA: Diagnosis not present

## 2019-09-11 DIAGNOSIS — N179 Acute kidney failure, unspecified: Secondary | ICD-10-CM | POA: Diagnosis not present

## 2019-09-11 DIAGNOSIS — D649 Anemia, unspecified: Secondary | ICD-10-CM | POA: Diagnosis not present

## 2019-09-11 DIAGNOSIS — R0602 Shortness of breath: Secondary | ICD-10-CM | POA: Diagnosis not present

## 2019-09-11 DIAGNOSIS — D631 Anemia in chronic kidney disease: Secondary | ICD-10-CM | POA: Diagnosis not present

## 2019-09-11 DIAGNOSIS — R918 Other nonspecific abnormal finding of lung field: Secondary | ICD-10-CM | POA: Diagnosis not present

## 2019-09-11 DIAGNOSIS — E875 Hyperkalemia: Secondary | ICD-10-CM | POA: Diagnosis not present

## 2019-09-11 DIAGNOSIS — N184 Chronic kidney disease, stage 4 (severe): Secondary | ICD-10-CM | POA: Diagnosis not present

## 2019-09-11 DIAGNOSIS — N189 Chronic kidney disease, unspecified: Secondary | ICD-10-CM | POA: Diagnosis not present

## 2019-09-11 DIAGNOSIS — K5900 Constipation, unspecified: Secondary | ICD-10-CM | POA: Diagnosis not present

## 2019-09-11 DIAGNOSIS — I10 Essential (primary) hypertension: Secondary | ICD-10-CM | POA: Diagnosis not present

## 2019-09-12 DIAGNOSIS — N184 Chronic kidney disease, stage 4 (severe): Secondary | ICD-10-CM | POA: Diagnosis not present

## 2019-09-12 DIAGNOSIS — N179 Acute kidney failure, unspecified: Secondary | ICD-10-CM | POA: Diagnosis not present

## 2019-09-12 DIAGNOSIS — E872 Acidosis: Secondary | ICD-10-CM | POA: Diagnosis not present

## 2019-09-12 DIAGNOSIS — E875 Hyperkalemia: Secondary | ICD-10-CM | POA: Diagnosis not present

## 2019-09-12 DIAGNOSIS — N189 Chronic kidney disease, unspecified: Secondary | ICD-10-CM | POA: Diagnosis not present

## 2019-09-12 DIAGNOSIS — I10 Essential (primary) hypertension: Secondary | ICD-10-CM | POA: Diagnosis not present

## 2019-09-12 DIAGNOSIS — D631 Anemia in chronic kidney disease: Secondary | ICD-10-CM | POA: Diagnosis not present

## 2019-09-12 DIAGNOSIS — D649 Anemia, unspecified: Secondary | ICD-10-CM | POA: Diagnosis not present

## 2019-09-12 DIAGNOSIS — I517 Cardiomegaly: Secondary | ICD-10-CM | POA: Diagnosis not present

## 2019-09-12 DIAGNOSIS — R0602 Shortness of breath: Secondary | ICD-10-CM | POA: Diagnosis not present

## 2019-09-12 DIAGNOSIS — R918 Other nonspecific abnormal finding of lung field: Secondary | ICD-10-CM | POA: Diagnosis not present

## 2019-09-12 DIAGNOSIS — I083 Combined rheumatic disorders of mitral, aortic and tricuspid valves: Secondary | ICD-10-CM | POA: Diagnosis not present

## 2019-09-12 LAB — HEMOGLOBIN A1C: Hemoglobin A1C: 5.1

## 2019-09-18 DIAGNOSIS — Z515 Encounter for palliative care: Secondary | ICD-10-CM | POA: Diagnosis not present

## 2019-09-18 DIAGNOSIS — R0602 Shortness of breath: Secondary | ICD-10-CM | POA: Diagnosis not present

## 2019-09-18 DIAGNOSIS — R069 Unspecified abnormalities of breathing: Secondary | ICD-10-CM | POA: Diagnosis not present

## 2019-09-18 DIAGNOSIS — N179 Acute kidney failure, unspecified: Secondary | ICD-10-CM | POA: Diagnosis not present

## 2019-09-18 DIAGNOSIS — J449 Chronic obstructive pulmonary disease, unspecified: Secondary | ICD-10-CM | POA: Diagnosis not present

## 2019-09-18 DIAGNOSIS — J9601 Acute respiratory failure with hypoxia: Secondary | ICD-10-CM | POA: Diagnosis not present

## 2019-09-18 DIAGNOSIS — R404 Transient alteration of awareness: Secondary | ICD-10-CM | POA: Diagnosis not present

## 2019-09-18 DIAGNOSIS — I2510 Atherosclerotic heart disease of native coronary artery without angina pectoris: Secondary | ICD-10-CM | POA: Diagnosis not present

## 2019-09-18 DIAGNOSIS — R4020 Unspecified coma: Secondary | ICD-10-CM | POA: Diagnosis not present

## 2019-09-18 DIAGNOSIS — R52 Pain, unspecified: Secondary | ICD-10-CM | POA: Diagnosis not present

## 2019-09-18 DIAGNOSIS — J156 Pneumonia due to other aerobic Gram-negative bacteria: Secondary | ICD-10-CM | POA: Diagnosis not present

## 2019-09-18 DIAGNOSIS — J9621 Acute and chronic respiratory failure with hypoxia: Secondary | ICD-10-CM | POA: Diagnosis not present

## 2019-09-18 DIAGNOSIS — R918 Other nonspecific abnormal finding of lung field: Secondary | ICD-10-CM | POA: Diagnosis not present

## 2019-09-18 DIAGNOSIS — R062 Wheezing: Secondary | ICD-10-CM | POA: Diagnosis not present

## 2019-09-18 DIAGNOSIS — J189 Pneumonia, unspecified organism: Secondary | ICD-10-CM | POA: Diagnosis not present

## 2019-09-18 DIAGNOSIS — R4182 Altered mental status, unspecified: Secondary | ICD-10-CM | POA: Diagnosis not present

## 2019-09-18 DIAGNOSIS — G9341 Metabolic encephalopathy: Secondary | ICD-10-CM | POA: Diagnosis not present

## 2019-09-18 DIAGNOSIS — B354 Tinea corporis: Secondary | ICD-10-CM | POA: Diagnosis not present

## 2019-09-18 DIAGNOSIS — J209 Acute bronchitis, unspecified: Secondary | ICD-10-CM | POA: Diagnosis not present

## 2019-09-18 DIAGNOSIS — I4891 Unspecified atrial fibrillation: Secondary | ICD-10-CM | POA: Diagnosis not present

## 2019-09-18 DIAGNOSIS — T68XXXA Hypothermia, initial encounter: Secondary | ICD-10-CM | POA: Diagnosis not present

## 2019-09-18 DIAGNOSIS — J441 Chronic obstructive pulmonary disease with (acute) exacerbation: Secondary | ICD-10-CM | POA: Diagnosis not present

## 2019-09-18 DIAGNOSIS — E1159 Type 2 diabetes mellitus with other circulatory complications: Secondary | ICD-10-CM | POA: Diagnosis not present

## 2019-09-18 DIAGNOSIS — N184 Chronic kidney disease, stage 4 (severe): Secondary | ICD-10-CM | POA: Diagnosis not present

## 2019-09-18 DIAGNOSIS — D631 Anemia in chronic kidney disease: Secondary | ICD-10-CM | POA: Diagnosis not present

## 2019-09-18 DIAGNOSIS — Z79899 Other long term (current) drug therapy: Secondary | ICD-10-CM | POA: Diagnosis not present

## 2019-09-18 DIAGNOSIS — G9340 Encephalopathy, unspecified: Secondary | ICD-10-CM | POA: Diagnosis not present

## 2019-09-18 DIAGNOSIS — R0902 Hypoxemia: Secondary | ICD-10-CM | POA: Diagnosis not present

## 2019-09-18 DIAGNOSIS — E440 Moderate protein-calorie malnutrition: Secondary | ICD-10-CM | POA: Diagnosis not present

## 2019-09-18 DIAGNOSIS — I10 Essential (primary) hypertension: Secondary | ICD-10-CM | POA: Diagnosis not present

## 2019-09-18 DIAGNOSIS — Z7189 Other specified counseling: Secondary | ICD-10-CM | POA: Diagnosis not present

## 2019-09-18 DIAGNOSIS — R0600 Dyspnea, unspecified: Secondary | ICD-10-CM | POA: Diagnosis not present

## 2019-09-18 DIAGNOSIS — R0603 Acute respiratory distress: Secondary | ICD-10-CM | POA: Diagnosis not present

## 2019-09-18 DIAGNOSIS — R69 Illness, unspecified: Secondary | ICD-10-CM | POA: Diagnosis not present

## 2019-09-18 DIAGNOSIS — Z951 Presence of aortocoronary bypass graft: Secondary | ICD-10-CM | POA: Diagnosis not present

## 2019-09-18 DIAGNOSIS — J440 Chronic obstructive pulmonary disease with acute lower respiratory infection: Secondary | ICD-10-CM | POA: Diagnosis not present

## 2019-09-18 DIAGNOSIS — J438 Other emphysema: Secondary | ICD-10-CM | POA: Diagnosis not present

## 2019-09-18 DIAGNOSIS — I739 Peripheral vascular disease, unspecified: Secondary | ICD-10-CM | POA: Diagnosis not present

## 2019-09-18 DIAGNOSIS — R001 Bradycardia, unspecified: Secondary | ICD-10-CM | POA: Diagnosis not present

## 2019-09-19 DIAGNOSIS — J189 Pneumonia, unspecified organism: Secondary | ICD-10-CM | POA: Diagnosis not present

## 2019-09-19 DIAGNOSIS — D631 Anemia in chronic kidney disease: Secondary | ICD-10-CM | POA: Diagnosis not present

## 2019-09-19 DIAGNOSIS — J9601 Acute respiratory failure with hypoxia: Secondary | ICD-10-CM | POA: Diagnosis not present

## 2019-09-19 DIAGNOSIS — N184 Chronic kidney disease, stage 4 (severe): Secondary | ICD-10-CM | POA: Diagnosis not present

## 2019-09-20 DIAGNOSIS — D631 Anemia in chronic kidney disease: Secondary | ICD-10-CM | POA: Diagnosis not present

## 2019-09-20 DIAGNOSIS — J189 Pneumonia, unspecified organism: Secondary | ICD-10-CM | POA: Diagnosis not present

## 2019-09-20 DIAGNOSIS — N184 Chronic kidney disease, stage 4 (severe): Secondary | ICD-10-CM | POA: Diagnosis not present

## 2019-09-20 DIAGNOSIS — R0602 Shortness of breath: Secondary | ICD-10-CM | POA: Diagnosis not present

## 2019-09-20 DIAGNOSIS — J9601 Acute respiratory failure with hypoxia: Secondary | ICD-10-CM | POA: Diagnosis not present

## 2019-09-21 DIAGNOSIS — R0600 Dyspnea, unspecified: Secondary | ICD-10-CM | POA: Diagnosis not present

## 2019-09-21 DIAGNOSIS — R52 Pain, unspecified: Secondary | ICD-10-CM | POA: Diagnosis not present

## 2019-09-21 DIAGNOSIS — N184 Chronic kidney disease, stage 4 (severe): Secondary | ICD-10-CM | POA: Diagnosis not present

## 2019-09-21 DIAGNOSIS — J9601 Acute respiratory failure with hypoxia: Secondary | ICD-10-CM | POA: Diagnosis not present

## 2019-09-21 DIAGNOSIS — D631 Anemia in chronic kidney disease: Secondary | ICD-10-CM | POA: Diagnosis not present

## 2019-09-21 DIAGNOSIS — R69 Illness, unspecified: Secondary | ICD-10-CM | POA: Diagnosis not present

## 2019-09-21 DIAGNOSIS — Z515 Encounter for palliative care: Secondary | ICD-10-CM | POA: Diagnosis not present

## 2019-09-21 DIAGNOSIS — J189 Pneumonia, unspecified organism: Secondary | ICD-10-CM | POA: Diagnosis not present

## 2019-09-21 DIAGNOSIS — I10 Essential (primary) hypertension: Secondary | ICD-10-CM | POA: Diagnosis not present

## 2019-09-21 DIAGNOSIS — Z7189 Other specified counseling: Secondary | ICD-10-CM | POA: Diagnosis not present

## 2019-09-21 DIAGNOSIS — J449 Chronic obstructive pulmonary disease, unspecified: Secondary | ICD-10-CM | POA: Diagnosis not present

## 2019-09-21 DIAGNOSIS — I739 Peripheral vascular disease, unspecified: Secondary | ICD-10-CM | POA: Diagnosis not present

## 2019-09-22 DIAGNOSIS — Z951 Presence of aortocoronary bypass graft: Secondary | ICD-10-CM | POA: Diagnosis not present

## 2019-09-22 DIAGNOSIS — I739 Peripheral vascular disease, unspecified: Secondary | ICD-10-CM | POA: Diagnosis not present

## 2019-09-22 DIAGNOSIS — J9601 Acute respiratory failure with hypoxia: Secondary | ICD-10-CM | POA: Diagnosis not present

## 2019-09-22 DIAGNOSIS — J449 Chronic obstructive pulmonary disease, unspecified: Secondary | ICD-10-CM | POA: Diagnosis not present

## 2019-09-22 DIAGNOSIS — D631 Anemia in chronic kidney disease: Secondary | ICD-10-CM | POA: Diagnosis not present

## 2019-09-22 DIAGNOSIS — I10 Essential (primary) hypertension: Secondary | ICD-10-CM | POA: Diagnosis not present

## 2019-09-22 DIAGNOSIS — R918 Other nonspecific abnormal finding of lung field: Secondary | ICD-10-CM | POA: Diagnosis not present

## 2019-09-22 DIAGNOSIS — R0603 Acute respiratory distress: Secondary | ICD-10-CM | POA: Diagnosis not present

## 2019-09-22 DIAGNOSIS — J189 Pneumonia, unspecified organism: Secondary | ICD-10-CM | POA: Diagnosis not present

## 2019-09-22 DIAGNOSIS — N184 Chronic kidney disease, stage 4 (severe): Secondary | ICD-10-CM | POA: Diagnosis not present

## 2019-09-23 DIAGNOSIS — I10 Essential (primary) hypertension: Secondary | ICD-10-CM | POA: Diagnosis not present

## 2019-09-23 DIAGNOSIS — I739 Peripheral vascular disease, unspecified: Secondary | ICD-10-CM | POA: Diagnosis not present

## 2019-09-23 DIAGNOSIS — N184 Chronic kidney disease, stage 4 (severe): Secondary | ICD-10-CM | POA: Diagnosis not present

## 2019-09-23 DIAGNOSIS — J9601 Acute respiratory failure with hypoxia: Secondary | ICD-10-CM | POA: Diagnosis not present

## 2019-09-23 DIAGNOSIS — J449 Chronic obstructive pulmonary disease, unspecified: Secondary | ICD-10-CM | POA: Diagnosis not present

## 2019-09-23 DIAGNOSIS — D631 Anemia in chronic kidney disease: Secondary | ICD-10-CM | POA: Diagnosis not present

## 2019-09-23 DIAGNOSIS — J189 Pneumonia, unspecified organism: Secondary | ICD-10-CM | POA: Diagnosis not present

## 2019-10-01 ENCOUNTER — Other Ambulatory Visit: Payer: Self-pay | Admitting: Osteopathic Medicine

## 2019-10-05 DIAGNOSIS — M5136 Other intervertebral disc degeneration, lumbar region: Secondary | ICD-10-CM | POA: Diagnosis not present

## 2019-10-05 DIAGNOSIS — Z79899 Other long term (current) drug therapy: Secondary | ICD-10-CM | POA: Diagnosis not present

## 2019-10-05 DIAGNOSIS — M1612 Unilateral primary osteoarthritis, left hip: Secondary | ICD-10-CM | POA: Diagnosis not present

## 2019-10-05 DIAGNOSIS — Z79891 Long term (current) use of opiate analgesic: Secondary | ICD-10-CM | POA: Diagnosis not present

## 2019-10-05 DIAGNOSIS — M19019 Primary osteoarthritis, unspecified shoulder: Secondary | ICD-10-CM | POA: Diagnosis not present

## 2019-10-05 DIAGNOSIS — G894 Chronic pain syndrome: Secondary | ICD-10-CM | POA: Diagnosis not present

## 2019-10-10 DIAGNOSIS — R4182 Altered mental status, unspecified: Secondary | ICD-10-CM | POA: Diagnosis not present

## 2019-10-10 DIAGNOSIS — R69 Illness, unspecified: Secondary | ICD-10-CM | POA: Diagnosis not present

## 2019-10-10 DIAGNOSIS — J984 Other disorders of lung: Secondary | ICD-10-CM | POA: Diagnosis not present

## 2019-10-10 DIAGNOSIS — R410 Disorientation, unspecified: Secondary | ICD-10-CM | POA: Diagnosis not present

## 2019-10-10 DIAGNOSIS — Z8719 Personal history of other diseases of the digestive system: Secondary | ICD-10-CM | POA: Diagnosis not present

## 2019-10-10 DIAGNOSIS — R918 Other nonspecific abnormal finding of lung field: Secondary | ICD-10-CM | POA: Diagnosis not present

## 2019-10-10 DIAGNOSIS — R0902 Hypoxemia: Secondary | ICD-10-CM | POA: Diagnosis not present

## 2019-10-10 DIAGNOSIS — R531 Weakness: Secondary | ICD-10-CM | POA: Diagnosis not present

## 2019-10-10 DIAGNOSIS — I4891 Unspecified atrial fibrillation: Secondary | ICD-10-CM | POA: Diagnosis not present

## 2019-10-10 DIAGNOSIS — J189 Pneumonia, unspecified organism: Secondary | ICD-10-CM | POA: Diagnosis not present

## 2019-10-10 DIAGNOSIS — R4781 Slurred speech: Secondary | ICD-10-CM | POA: Diagnosis not present

## 2019-10-10 DIAGNOSIS — Z87448 Personal history of other diseases of urinary system: Secondary | ICD-10-CM | POA: Diagnosis not present

## 2019-10-10 DIAGNOSIS — J449 Chronic obstructive pulmonary disease, unspecified: Secondary | ICD-10-CM | POA: Diagnosis not present

## 2019-10-10 DIAGNOSIS — E872 Acidosis: Secondary | ICD-10-CM | POA: Diagnosis not present

## 2019-10-10 DIAGNOSIS — I10 Essential (primary) hypertension: Secondary | ICD-10-CM | POA: Diagnosis not present

## 2019-10-11 DIAGNOSIS — N179 Acute kidney failure, unspecified: Secondary | ICD-10-CM | POA: Diagnosis not present

## 2019-10-11 DIAGNOSIS — R05 Cough: Secondary | ICD-10-CM | POA: Diagnosis not present

## 2019-10-11 DIAGNOSIS — R1310 Dysphagia, unspecified: Secondary | ICD-10-CM | POA: Diagnosis not present

## 2019-10-11 DIAGNOSIS — E874 Mixed disorder of acid-base balance: Secondary | ICD-10-CM | POA: Diagnosis not present

## 2019-10-11 DIAGNOSIS — E440 Moderate protein-calorie malnutrition: Secondary | ICD-10-CM | POA: Diagnosis not present

## 2019-10-11 DIAGNOSIS — Z8719 Personal history of other diseases of the digestive system: Secondary | ICD-10-CM | POA: Diagnosis not present

## 2019-10-11 DIAGNOSIS — R4182 Altered mental status, unspecified: Secondary | ICD-10-CM | POA: Diagnosis not present

## 2019-10-11 DIAGNOSIS — J189 Pneumonia, unspecified organism: Secondary | ICD-10-CM | POA: Diagnosis not present

## 2019-10-11 DIAGNOSIS — R410 Disorientation, unspecified: Secondary | ICD-10-CM | POA: Diagnosis not present

## 2019-10-11 DIAGNOSIS — R062 Wheezing: Secondary | ICD-10-CM | POA: Diagnosis not present

## 2019-10-11 DIAGNOSIS — R918 Other nonspecific abnormal finding of lung field: Secondary | ICD-10-CM | POA: Diagnosis not present

## 2019-10-11 DIAGNOSIS — Z515 Encounter for palliative care: Secondary | ICD-10-CM | POA: Diagnosis not present

## 2019-10-11 DIAGNOSIS — E872 Acidosis: Secondary | ICD-10-CM | POA: Diagnosis not present

## 2019-10-11 DIAGNOSIS — I739 Peripheral vascular disease, unspecified: Secondary | ICD-10-CM | POA: Diagnosis not present

## 2019-10-11 DIAGNOSIS — N184 Chronic kidney disease, stage 4 (severe): Secondary | ICD-10-CM | POA: Diagnosis not present

## 2019-10-11 DIAGNOSIS — G9340 Encephalopathy, unspecified: Secondary | ICD-10-CM | POA: Diagnosis not present

## 2019-10-11 DIAGNOSIS — I10 Essential (primary) hypertension: Secondary | ICD-10-CM | POA: Diagnosis not present

## 2019-10-11 DIAGNOSIS — Z7189 Other specified counseling: Secondary | ICD-10-CM | POA: Diagnosis not present

## 2019-10-11 DIAGNOSIS — J690 Pneumonitis due to inhalation of food and vomit: Secondary | ICD-10-CM | POA: Diagnosis not present

## 2019-10-11 DIAGNOSIS — J984 Other disorders of lung: Secondary | ICD-10-CM | POA: Diagnosis not present

## 2019-10-11 DIAGNOSIS — R69 Illness, unspecified: Secondary | ICD-10-CM | POA: Diagnosis not present

## 2019-10-11 DIAGNOSIS — R531 Weakness: Secondary | ICD-10-CM | POA: Diagnosis not present

## 2019-10-11 DIAGNOSIS — J449 Chronic obstructive pulmonary disease, unspecified: Secondary | ICD-10-CM | POA: Diagnosis not present

## 2019-10-11 DIAGNOSIS — Z87448 Personal history of other diseases of urinary system: Secondary | ICD-10-CM | POA: Diagnosis not present

## 2019-10-11 DIAGNOSIS — D631 Anemia in chronic kidney disease: Secondary | ICD-10-CM | POA: Diagnosis not present

## 2019-10-11 DIAGNOSIS — J438 Other emphysema: Secondary | ICD-10-CM | POA: Diagnosis not present

## 2019-10-11 DIAGNOSIS — R0989 Other specified symptoms and signs involving the circulatory and respiratory systems: Secondary | ICD-10-CM | POA: Diagnosis not present

## 2019-10-11 DIAGNOSIS — J439 Emphysema, unspecified: Secondary | ICD-10-CM | POA: Diagnosis not present

## 2019-10-11 DIAGNOSIS — J9621 Acute and chronic respiratory failure with hypoxia: Secondary | ICD-10-CM | POA: Diagnosis not present

## 2019-10-11 DIAGNOSIS — J9622 Acute and chronic respiratory failure with hypercapnia: Secondary | ICD-10-CM | POA: Diagnosis not present

## 2019-10-11 DIAGNOSIS — J9601 Acute respiratory failure with hypoxia: Secondary | ICD-10-CM | POA: Diagnosis not present

## 2019-10-11 DIAGNOSIS — R413 Other amnesia: Secondary | ICD-10-CM | POA: Diagnosis not present

## 2019-10-11 DIAGNOSIS — R509 Fever, unspecified: Secondary | ICD-10-CM | POA: Diagnosis not present

## 2019-10-11 DIAGNOSIS — J209 Acute bronchitis, unspecified: Secondary | ICD-10-CM | POA: Diagnosis not present

## 2019-10-11 DIAGNOSIS — N189 Chronic kidney disease, unspecified: Secondary | ICD-10-CM | POA: Diagnosis not present

## 2019-10-13 ENCOUNTER — Encounter: Payer: Self-pay | Admitting: Osteopathic Medicine

## 2019-11-03 DIAGNOSIS — M1612 Unilateral primary osteoarthritis, left hip: Secondary | ICD-10-CM | POA: Diagnosis not present

## 2019-11-03 DIAGNOSIS — M5136 Other intervertebral disc degeneration, lumbar region: Secondary | ICD-10-CM | POA: Diagnosis not present

## 2019-11-03 DIAGNOSIS — M19019 Primary osteoarthritis, unspecified shoulder: Secondary | ICD-10-CM | POA: Diagnosis not present

## 2019-11-03 DIAGNOSIS — G894 Chronic pain syndrome: Secondary | ICD-10-CM | POA: Diagnosis not present

## 2019-11-08 DIAGNOSIS — M19019 Primary osteoarthritis, unspecified shoulder: Secondary | ICD-10-CM | POA: Diagnosis not present

## 2019-11-10 DIAGNOSIS — R69 Illness, unspecified: Secondary | ICD-10-CM | POA: Diagnosis not present

## 2019-11-10 DIAGNOSIS — F5101 Primary insomnia: Secondary | ICD-10-CM | POA: Diagnosis not present

## 2019-11-10 DIAGNOSIS — F411 Generalized anxiety disorder: Secondary | ICD-10-CM | POA: Diagnosis not present

## 2019-11-10 DIAGNOSIS — F0281 Dementia in other diseases classified elsewhere with behavioral disturbance: Secondary | ICD-10-CM | POA: Diagnosis not present

## 2019-11-12 ENCOUNTER — Other Ambulatory Visit: Payer: Self-pay

## 2019-11-12 DIAGNOSIS — K219 Gastro-esophageal reflux disease without esophagitis: Secondary | ICD-10-CM

## 2019-11-12 MED ORDER — OMEPRAZOLE 20 MG PO CPDR: DELAYED_RELEASE_CAPSULE | ORAL | 11 refills | Status: AC

## 2019-11-14 DIAGNOSIS — M545 Low back pain: Secondary | ICD-10-CM | POA: Diagnosis not present

## 2019-11-14 DIAGNOSIS — R69 Illness, unspecified: Secondary | ICD-10-CM | POA: Diagnosis not present

## 2019-11-14 DIAGNOSIS — R52 Pain, unspecified: Secondary | ICD-10-CM | POA: Diagnosis not present

## 2019-11-14 DIAGNOSIS — N1831 Chronic kidney disease, stage 3a: Secondary | ICD-10-CM | POA: Diagnosis not present

## 2019-11-14 DIAGNOSIS — J189 Pneumonia, unspecified organism: Secondary | ICD-10-CM | POA: Diagnosis not present

## 2019-11-14 DIAGNOSIS — J9601 Acute respiratory failure with hypoxia: Secondary | ICD-10-CM | POA: Diagnosis not present

## 2019-11-14 DIAGNOSIS — R1310 Dysphagia, unspecified: Secondary | ICD-10-CM | POA: Diagnosis not present

## 2019-11-14 DIAGNOSIS — G9341 Metabolic encephalopathy: Secondary | ICD-10-CM | POA: Diagnosis not present

## 2019-11-14 DIAGNOSIS — E872 Acidosis: Secondary | ICD-10-CM | POA: Diagnosis not present

## 2019-11-14 DIAGNOSIS — R0902 Hypoxemia: Secondary | ICD-10-CM | POA: Diagnosis not present

## 2019-11-14 DIAGNOSIS — N184 Chronic kidney disease, stage 4 (severe): Secondary | ICD-10-CM | POA: Diagnosis not present

## 2019-11-14 DIAGNOSIS — G301 Alzheimer's disease with late onset: Secondary | ICD-10-CM | POA: Diagnosis not present

## 2019-11-14 DIAGNOSIS — I739 Peripheral vascular disease, unspecified: Secondary | ICD-10-CM | POA: Diagnosis not present

## 2019-11-14 DIAGNOSIS — E43 Unspecified severe protein-calorie malnutrition: Secondary | ICD-10-CM | POA: Diagnosis not present

## 2019-11-14 DIAGNOSIS — I10 Essential (primary) hypertension: Secondary | ICD-10-CM | POA: Diagnosis not present

## 2019-11-14 DIAGNOSIS — I959 Hypotension, unspecified: Secondary | ICD-10-CM | POA: Diagnosis not present

## 2019-11-14 DIAGNOSIS — G9340 Encephalopathy, unspecified: Secondary | ICD-10-CM | POA: Diagnosis not present

## 2019-11-14 DIAGNOSIS — J90 Pleural effusion, not elsewhere classified: Secondary | ICD-10-CM | POA: Diagnosis not present

## 2019-11-14 DIAGNOSIS — W1839XA Other fall on same level, initial encounter: Secondary | ICD-10-CM | POA: Diagnosis not present

## 2019-11-14 DIAGNOSIS — R4182 Altered mental status, unspecified: Secondary | ICD-10-CM | POA: Diagnosis not present

## 2019-11-14 DIAGNOSIS — Z20822 Contact with and (suspected) exposure to covid-19: Secondary | ICD-10-CM | POA: Diagnosis not present

## 2019-11-14 DIAGNOSIS — J9622 Acute and chronic respiratory failure with hypercapnia: Secondary | ICD-10-CM | POA: Diagnosis not present

## 2019-11-14 DIAGNOSIS — Z515 Encounter for palliative care: Secondary | ICD-10-CM | POA: Diagnosis not present

## 2019-11-14 DIAGNOSIS — S2241XA Multiple fractures of ribs, right side, initial encounter for closed fracture: Secondary | ICD-10-CM | POA: Diagnosis not present

## 2019-11-14 DIAGNOSIS — N179 Acute kidney failure, unspecified: Secondary | ICD-10-CM | POA: Diagnosis not present

## 2019-11-14 DIAGNOSIS — Z043 Encounter for examination and observation following other accident: Secondary | ICD-10-CM | POA: Diagnosis not present

## 2019-11-14 DIAGNOSIS — S2242XA Multiple fractures of ribs, left side, initial encounter for closed fracture: Secondary | ICD-10-CM | POA: Diagnosis not present

## 2019-11-14 DIAGNOSIS — R6520 Severe sepsis without septic shock: Secondary | ICD-10-CM | POA: Diagnosis not present

## 2019-11-14 DIAGNOSIS — R062 Wheezing: Secondary | ICD-10-CM | POA: Diagnosis not present

## 2019-11-14 DIAGNOSIS — A419 Sepsis, unspecified organism: Secondary | ICD-10-CM | POA: Diagnosis not present

## 2019-11-14 DIAGNOSIS — J449 Chronic obstructive pulmonary disease, unspecified: Secondary | ICD-10-CM | POA: Diagnosis not present

## 2019-11-14 DIAGNOSIS — M5134 Other intervertebral disc degeneration, thoracic region: Secondary | ICD-10-CM | POA: Diagnosis not present

## 2019-11-14 DIAGNOSIS — J9621 Acute and chronic respiratory failure with hypoxia: Secondary | ICD-10-CM | POA: Diagnosis not present

## 2019-11-15 DIAGNOSIS — R1310 Dysphagia, unspecified: Secondary | ICD-10-CM | POA: Diagnosis not present

## 2019-11-15 DIAGNOSIS — J189 Pneumonia, unspecified organism: Secondary | ICD-10-CM | POA: Diagnosis not present

## 2019-11-15 DIAGNOSIS — J9622 Acute and chronic respiratory failure with hypercapnia: Secondary | ICD-10-CM | POA: Diagnosis not present

## 2019-11-15 DIAGNOSIS — N179 Acute kidney failure, unspecified: Secondary | ICD-10-CM | POA: Diagnosis not present

## 2019-11-15 DIAGNOSIS — Z515 Encounter for palliative care: Secondary | ICD-10-CM | POA: Diagnosis not present

## 2019-11-15 DIAGNOSIS — J9601 Acute respiratory failure with hypoxia: Secondary | ICD-10-CM | POA: Diagnosis not present

## 2019-11-15 DIAGNOSIS — G9340 Encephalopathy, unspecified: Secondary | ICD-10-CM | POA: Diagnosis not present

## 2019-11-15 DIAGNOSIS — G9341 Metabolic encephalopathy: Secondary | ICD-10-CM | POA: Diagnosis not present

## 2019-11-15 DIAGNOSIS — A419 Sepsis, unspecified organism: Secondary | ICD-10-CM | POA: Diagnosis not present

## 2019-11-15 DIAGNOSIS — J90 Pleural effusion, not elsewhere classified: Secondary | ICD-10-CM | POA: Diagnosis not present

## 2019-11-15 DIAGNOSIS — J449 Chronic obstructive pulmonary disease, unspecified: Secondary | ICD-10-CM | POA: Diagnosis not present

## 2019-11-15 DIAGNOSIS — S2242XA Multiple fractures of ribs, left side, initial encounter for closed fracture: Secondary | ICD-10-CM | POA: Diagnosis not present

## 2019-11-16 DIAGNOSIS — A419 Sepsis, unspecified organism: Secondary | ICD-10-CM | POA: Diagnosis not present

## 2019-11-16 DIAGNOSIS — N179 Acute kidney failure, unspecified: Secondary | ICD-10-CM | POA: Diagnosis not present

## 2019-11-16 DIAGNOSIS — S2242XA Multiple fractures of ribs, left side, initial encounter for closed fracture: Secondary | ICD-10-CM | POA: Diagnosis not present

## 2019-11-16 DIAGNOSIS — J189 Pneumonia, unspecified organism: Secondary | ICD-10-CM | POA: Diagnosis not present

## 2019-11-16 DIAGNOSIS — Z515 Encounter for palliative care: Secondary | ICD-10-CM | POA: Diagnosis not present

## 2019-11-16 DIAGNOSIS — J449 Chronic obstructive pulmonary disease, unspecified: Secondary | ICD-10-CM | POA: Diagnosis not present

## 2019-11-16 DIAGNOSIS — J9622 Acute and chronic respiratory failure with hypercapnia: Secondary | ICD-10-CM | POA: Diagnosis not present

## 2019-11-16 DIAGNOSIS — G9341 Metabolic encephalopathy: Secondary | ICD-10-CM | POA: Diagnosis not present

## 2019-11-16 DIAGNOSIS — R1310 Dysphagia, unspecified: Secondary | ICD-10-CM | POA: Diagnosis not present

## 2019-11-16 DIAGNOSIS — J9601 Acute respiratory failure with hypoxia: Secondary | ICD-10-CM | POA: Diagnosis not present

## 2019-11-17 DIAGNOSIS — Z515 Encounter for palliative care: Secondary | ICD-10-CM | POA: Diagnosis not present

## 2019-11-17 DIAGNOSIS — J449 Chronic obstructive pulmonary disease, unspecified: Secondary | ICD-10-CM | POA: Diagnosis not present

## 2019-11-17 DIAGNOSIS — E43 Unspecified severe protein-calorie malnutrition: Secondary | ICD-10-CM | POA: Diagnosis not present

## 2019-11-17 DIAGNOSIS — J9621 Acute and chronic respiratory failure with hypoxia: Secondary | ICD-10-CM | POA: Diagnosis not present

## 2019-11-17 DIAGNOSIS — I739 Peripheral vascular disease, unspecified: Secondary | ICD-10-CM | POA: Diagnosis not present

## 2019-11-17 DIAGNOSIS — J9622 Acute and chronic respiratory failure with hypercapnia: Secondary | ICD-10-CM | POA: Diagnosis not present

## 2019-11-17 DIAGNOSIS — A419 Sepsis, unspecified organism: Secondary | ICD-10-CM | POA: Diagnosis not present

## 2019-11-17 DIAGNOSIS — N179 Acute kidney failure, unspecified: Secondary | ICD-10-CM | POA: Diagnosis not present

## 2019-11-17 DIAGNOSIS — N1831 Chronic kidney disease, stage 3a: Secondary | ICD-10-CM | POA: Diagnosis not present

## 2019-11-17 DIAGNOSIS — G301 Alzheimer's disease with late onset: Secondary | ICD-10-CM | POA: Diagnosis not present

## 2019-11-18 DIAGNOSIS — J438 Other emphysema: Secondary | ICD-10-CM | POA: Diagnosis not present

## 2019-11-18 DIAGNOSIS — R062 Wheezing: Secondary | ICD-10-CM | POA: Diagnosis not present

## 2019-11-18 DIAGNOSIS — J449 Chronic obstructive pulmonary disease, unspecified: Secondary | ICD-10-CM | POA: Diagnosis not present

## 2019-11-18 DIAGNOSIS — J209 Acute bronchitis, unspecified: Secondary | ICD-10-CM | POA: Diagnosis not present

## 2019-12-08 DEATH — deceased
